# Patient Record
Sex: Female | Born: 1945 | Race: White | Hispanic: No | Smoking: Never smoker
Health system: Southern US, Community
[De-identification: ages and names within clinical notes are randomized; demographics above are authoritative.]

## PROBLEM LIST (undated history)

## (undated) DIAGNOSIS — R112 Nausea with vomiting, unspecified: Secondary | ICD-10-CM

## (undated) DIAGNOSIS — R7303 Prediabetes: Secondary | ICD-10-CM

## (undated) DIAGNOSIS — J45909 Unspecified asthma, uncomplicated: Secondary | ICD-10-CM

## (undated) DIAGNOSIS — S270XXA Traumatic pneumothorax, initial encounter: Secondary | ICD-10-CM

## (undated) DIAGNOSIS — I10 Essential (primary) hypertension: Secondary | ICD-10-CM

## (undated) DIAGNOSIS — I739 Peripheral vascular disease, unspecified: Secondary | ICD-10-CM

## (undated) DIAGNOSIS — M1990 Unspecified osteoarthritis, unspecified site: Secondary | ICD-10-CM

## (undated) DIAGNOSIS — T8859XA Other complications of anesthesia, initial encounter: Secondary | ICD-10-CM

## (undated) DIAGNOSIS — K219 Gastro-esophageal reflux disease without esophagitis: Secondary | ICD-10-CM

## (undated) DIAGNOSIS — E785 Hyperlipidemia, unspecified: Secondary | ICD-10-CM

## (undated) DIAGNOSIS — Z9889 Other specified postprocedural states: Secondary | ICD-10-CM

## (undated) DIAGNOSIS — C801 Malignant (primary) neoplasm, unspecified: Secondary | ICD-10-CM

## (undated) DIAGNOSIS — J189 Pneumonia, unspecified organism: Secondary | ICD-10-CM

## (undated) HISTORY — DX: Traumatic pneumothorax, initial encounter: S27.0XXA

## (undated) HISTORY — DX: Hyperlipidemia, unspecified: E78.5

## (undated) HISTORY — PX: REDUCTION MAMMAPLASTY: SUR839

## (undated) HISTORY — PX: BREAST SURGERY: SHX581

## (undated) HISTORY — PX: AUGMENTATION MAMMAPLASTY: SUR837

---

## 1980-10-15 HISTORY — PX: ABDOMINAL HYSTERECTOMY: SHX81

## 2001-08-11 ENCOUNTER — Encounter: Payer: Self-pay | Admitting: Family Medicine

## 2001-08-11 ENCOUNTER — Ambulatory Visit (HOSPITAL_COMMUNITY): Admission: RE | Admit: 2001-08-11 | Discharge: 2001-08-11 | Payer: Self-pay | Admitting: Family Medicine

## 2001-08-11 ENCOUNTER — Other Ambulatory Visit: Admission: RE | Admit: 2001-08-11 | Discharge: 2001-08-11 | Payer: Self-pay | Admitting: Family Medicine

## 2001-09-02 ENCOUNTER — Ambulatory Visit (HOSPITAL_COMMUNITY): Admission: RE | Admit: 2001-09-02 | Discharge: 2001-09-02 | Payer: Self-pay | Admitting: Family Medicine

## 2001-09-02 ENCOUNTER — Encounter: Payer: Self-pay | Admitting: Family Medicine

## 2002-05-05 ENCOUNTER — Encounter: Admission: RE | Admit: 2002-05-05 | Discharge: 2002-05-05 | Payer: Self-pay | Admitting: Neurosurgery

## 2002-05-05 ENCOUNTER — Encounter: Payer: Self-pay | Admitting: Neurosurgery

## 2002-05-19 ENCOUNTER — Encounter: Admission: RE | Admit: 2002-05-19 | Discharge: 2002-05-19 | Payer: Self-pay | Admitting: Neurosurgery

## 2002-05-19 ENCOUNTER — Encounter: Payer: Self-pay | Admitting: Neurosurgery

## 2002-06-01 ENCOUNTER — Encounter: Payer: Self-pay | Admitting: Neurosurgery

## 2002-06-01 ENCOUNTER — Encounter: Admission: RE | Admit: 2002-06-01 | Discharge: 2002-06-01 | Payer: Self-pay | Admitting: Neurosurgery

## 2002-10-11 ENCOUNTER — Emergency Department (HOSPITAL_COMMUNITY): Admission: EM | Admit: 2002-10-11 | Discharge: 2002-10-12 | Payer: Self-pay | Admitting: Emergency Medicine

## 2002-12-21 ENCOUNTER — Encounter: Admission: RE | Admit: 2002-12-21 | Discharge: 2002-12-21 | Payer: Self-pay | Admitting: Plastic Surgery

## 2002-12-21 ENCOUNTER — Encounter: Payer: Self-pay | Admitting: Plastic Surgery

## 2003-08-26 ENCOUNTER — Ambulatory Visit (HOSPITAL_COMMUNITY): Admission: RE | Admit: 2003-08-26 | Discharge: 2003-08-26 | Payer: Self-pay | Admitting: Pulmonary Disease

## 2003-12-23 ENCOUNTER — Ambulatory Visit (HOSPITAL_COMMUNITY): Admission: RE | Admit: 2003-12-23 | Discharge: 2003-12-23 | Payer: Self-pay | Admitting: Family Medicine

## 2005-01-11 ENCOUNTER — Ambulatory Visit (HOSPITAL_COMMUNITY): Admission: RE | Admit: 2005-01-11 | Discharge: 2005-01-11 | Payer: Self-pay | Admitting: Family Medicine

## 2005-02-05 ENCOUNTER — Ambulatory Visit (HOSPITAL_COMMUNITY): Admission: RE | Admit: 2005-02-05 | Discharge: 2005-02-05 | Payer: Self-pay | Admitting: Family Medicine

## 2005-10-15 HISTORY — PX: AXILLARY SURGERY: SHX892

## 2006-01-21 ENCOUNTER — Ambulatory Visit (HOSPITAL_COMMUNITY): Admission: RE | Admit: 2006-01-21 | Discharge: 2006-01-21 | Payer: Self-pay | Admitting: Family Medicine

## 2006-07-23 ENCOUNTER — Ambulatory Visit (HOSPITAL_COMMUNITY): Admission: RE | Admit: 2006-07-23 | Discharge: 2006-07-23 | Payer: Self-pay | Admitting: Orthopaedic Surgery

## 2006-07-30 ENCOUNTER — Ambulatory Visit (HOSPITAL_COMMUNITY): Admission: RE | Admit: 2006-07-30 | Discharge: 2006-07-30 | Payer: Self-pay | Admitting: Orthopaedic Surgery

## 2006-07-30 ENCOUNTER — Encounter (INDEPENDENT_AMBULATORY_CARE_PROVIDER_SITE_OTHER): Payer: Self-pay | Admitting: *Deleted

## 2006-08-01 ENCOUNTER — Inpatient Hospital Stay (HOSPITAL_COMMUNITY): Admission: AD | Admit: 2006-08-01 | Discharge: 2006-08-09 | Payer: Self-pay | Admitting: General Surgery

## 2006-08-02 ENCOUNTER — Encounter (INDEPENDENT_AMBULATORY_CARE_PROVIDER_SITE_OTHER): Payer: Self-pay | Admitting: Specialist

## 2006-08-10 ENCOUNTER — Emergency Department (HOSPITAL_COMMUNITY): Admission: EM | Admit: 2006-08-10 | Discharge: 2006-08-10 | Payer: Self-pay | Admitting: Emergency Medicine

## 2006-08-10 ENCOUNTER — Ambulatory Visit (HOSPITAL_COMMUNITY): Payer: Self-pay | Admitting: General Surgery

## 2006-08-10 ENCOUNTER — Encounter (HOSPITAL_COMMUNITY): Admission: RE | Admit: 2006-08-10 | Discharge: 2006-09-09 | Payer: Self-pay | Admitting: General Surgery

## 2006-08-11 ENCOUNTER — Emergency Department (HOSPITAL_COMMUNITY): Admission: EM | Admit: 2006-08-11 | Discharge: 2006-08-11 | Payer: Self-pay | Admitting: Emergency Medicine

## 2006-08-12 ENCOUNTER — Encounter (HOSPITAL_COMMUNITY): Admission: RE | Admit: 2006-08-12 | Discharge: 2006-08-14 | Payer: Self-pay | Admitting: General Surgery

## 2006-08-14 ENCOUNTER — Encounter (HOSPITAL_COMMUNITY): Admission: RE | Admit: 2006-08-14 | Discharge: 2006-09-13 | Payer: Self-pay | Admitting: Orthopaedic Surgery

## 2006-09-10 ENCOUNTER — Encounter (HOSPITAL_COMMUNITY): Admission: RE | Admit: 2006-09-10 | Discharge: 2006-10-10 | Payer: Self-pay | Admitting: General Surgery

## 2006-10-15 HISTORY — PX: MASTECTOMY: SHX3

## 2007-01-23 ENCOUNTER — Ambulatory Visit (HOSPITAL_COMMUNITY): Admission: RE | Admit: 2007-01-23 | Discharge: 2007-01-23 | Payer: Self-pay | Admitting: Family Medicine

## 2007-01-30 ENCOUNTER — Encounter (INDEPENDENT_AMBULATORY_CARE_PROVIDER_SITE_OTHER): Payer: Self-pay | Admitting: Diagnostic Radiology

## 2007-01-30 ENCOUNTER — Encounter (INDEPENDENT_AMBULATORY_CARE_PROVIDER_SITE_OTHER): Payer: Self-pay | Admitting: Specialist

## 2007-01-30 ENCOUNTER — Encounter: Admission: RE | Admit: 2007-01-30 | Discharge: 2007-01-30 | Payer: Self-pay | Admitting: Family Medicine

## 2007-02-11 ENCOUNTER — Encounter: Admission: RE | Admit: 2007-02-11 | Discharge: 2007-02-11 | Payer: Self-pay | Admitting: Family Medicine

## 2007-02-26 ENCOUNTER — Ambulatory Visit (HOSPITAL_COMMUNITY): Admission: RE | Admit: 2007-02-26 | Discharge: 2007-02-27 | Payer: Self-pay | Admitting: Surgery

## 2007-02-26 ENCOUNTER — Encounter (INDEPENDENT_AMBULATORY_CARE_PROVIDER_SITE_OTHER): Payer: Self-pay | Admitting: *Deleted

## 2007-03-06 ENCOUNTER — Ambulatory Visit: Payer: Self-pay | Admitting: Oncology

## 2007-04-03 LAB — COMPREHENSIVE METABOLIC PANEL
ALT: 38 U/L — ABNORMAL HIGH (ref 0–35)
AST: 29 U/L (ref 0–37)
Albumin: 4.3 g/dL (ref 3.5–5.2)
Alkaline Phosphatase: 88 U/L (ref 39–117)
BUN: 18 mg/dL (ref 6–23)
CO2: 26 mEq/L (ref 19–32)
Calcium: 9.4 mg/dL (ref 8.4–10.5)
Chloride: 103 mEq/L (ref 96–112)
Creatinine, Ser: 0.81 mg/dL (ref 0.40–1.20)
Glucose, Bld: 115 mg/dL — ABNORMAL HIGH (ref 70–99)
Potassium: 3.9 mEq/L (ref 3.5–5.3)
Sodium: 140 mEq/L (ref 135–145)
Total Bilirubin: 0.3 mg/dL (ref 0.3–1.2)
Total Protein: 7 g/dL (ref 6.0–8.3)

## 2007-04-03 LAB — CBC WITH DIFFERENTIAL/PLATELET
BASO%: 0.5 % (ref 0.0–2.0)
Basophils Absolute: 0 10*3/uL (ref 0.0–0.1)
EOS%: 3.7 % (ref 0.0–7.0)
Eosinophils Absolute: 0.3 10*3/uL (ref 0.0–0.5)
HCT: 31.9 % — ABNORMAL LOW (ref 34.8–46.6)
HGB: 11.3 g/dL — ABNORMAL LOW (ref 11.6–15.9)
LYMPH%: 37.4 % (ref 14.0–48.0)
MCH: 31.5 pg (ref 26.0–34.0)
MCHC: 35.4 g/dL (ref 32.0–36.0)
MCV: 88.8 fL (ref 81.0–101.0)
MONO#: 0.5 10*3/uL (ref 0.1–0.9)
MONO%: 7.4 % (ref 0.0–13.0)
NEUT#: 3.7 10*3/uL (ref 1.5–6.5)
NEUT%: 51 % (ref 39.6–76.8)
Platelets: 374 10*3/uL (ref 145–400)
RBC: 3.59 10*6/uL — ABNORMAL LOW (ref 3.70–5.32)
RDW: 11.7 % (ref 11.3–14.5)
WBC: 7.2 10*3/uL (ref 3.9–10.0)
lymph#: 2.7 10*3/uL (ref 0.9–3.3)

## 2007-04-24 ENCOUNTER — Encounter: Admission: RE | Admit: 2007-04-24 | Discharge: 2007-04-24 | Payer: Self-pay | Admitting: Oncology

## 2007-04-28 ENCOUNTER — Ambulatory Visit: Payer: Self-pay | Admitting: Oncology

## 2007-06-30 ENCOUNTER — Ambulatory Visit: Payer: Self-pay | Admitting: Oncology

## 2007-09-17 ENCOUNTER — Ambulatory Visit: Payer: Self-pay | Admitting: Oncology

## 2007-09-19 LAB — COMPREHENSIVE METABOLIC PANEL
ALT: 13 U/L (ref 0–35)
AST: 12 U/L (ref 0–37)
Albumin: 4 g/dL (ref 3.5–5.2)
Alkaline Phosphatase: 71 U/L (ref 39–117)
BUN: 17 mg/dL (ref 6–23)
CO2: 26 mEq/L (ref 19–32)
Calcium: 9.3 mg/dL (ref 8.4–10.5)
Chloride: 103 mEq/L (ref 96–112)
Creatinine, Ser: 0.79 mg/dL (ref 0.40–1.20)
Glucose, Bld: 118 mg/dL — ABNORMAL HIGH (ref 70–99)
Potassium: 3.6 mEq/L (ref 3.5–5.3)
Sodium: 140 mEq/L (ref 135–145)
Total Bilirubin: 0.3 mg/dL (ref 0.3–1.2)
Total Protein: 6.8 g/dL (ref 6.0–8.3)

## 2007-09-19 LAB — CBC WITH DIFFERENTIAL/PLATELET
BASO%: 0.4 % (ref 0.0–2.0)
Basophils Absolute: 0 10*3/uL (ref 0.0–0.1)
EOS%: 0.6 % (ref 0.0–7.0)
Eosinophils Absolute: 0.1 10*3/uL (ref 0.0–0.5)
HCT: 32.7 % — ABNORMAL LOW (ref 34.8–46.6)
HGB: 11.2 g/dL — ABNORMAL LOW (ref 11.6–15.9)
LYMPH%: 31.5 % (ref 14.0–48.0)
MCH: 30.3 pg (ref 26.0–34.0)
MCHC: 34.2 g/dL (ref 32.0–36.0)
MCV: 88.5 fL (ref 81.0–101.0)
MONO#: 0.9 10*3/uL (ref 0.1–0.9)
MONO%: 8 % (ref 0.0–13.0)
NEUT#: 6.6 10*3/uL — ABNORMAL HIGH (ref 1.5–6.5)
NEUT%: 59.5 % (ref 39.6–76.8)
Platelets: 434 10*3/uL — ABNORMAL HIGH (ref 145–400)
RBC: 3.7 10*6/uL (ref 3.70–5.32)
RDW: 14.3 % (ref 11.3–14.5)
WBC: 11.1 10*3/uL — ABNORMAL HIGH (ref 3.9–10.0)
lymph#: 3.5 10*3/uL — ABNORMAL HIGH (ref 0.9–3.3)

## 2007-09-19 LAB — CANCER ANTIGEN 27.29: CA 27.29: 20 U/mL (ref 0–39)

## 2007-09-19 LAB — LACTATE DEHYDROGENASE: LDH: 172 U/L (ref 94–250)

## 2007-10-16 HISTORY — PX: JOINT REPLACEMENT: SHX530

## 2008-01-05 ENCOUNTER — Ambulatory Visit (HOSPITAL_COMMUNITY): Admission: RE | Admit: 2008-01-05 | Discharge: 2008-01-05 | Payer: Self-pay | Admitting: Family Medicine

## 2008-01-26 ENCOUNTER — Encounter: Admission: RE | Admit: 2008-01-26 | Discharge: 2008-01-26 | Payer: Self-pay | Admitting: Oncology

## 2008-03-10 ENCOUNTER — Ambulatory Visit: Payer: Self-pay | Admitting: Oncology

## 2008-03-12 LAB — CBC WITH DIFFERENTIAL/PLATELET
BASO%: 0.2 % (ref 0.0–2.0)
Basophils Absolute: 0 10*3/uL (ref 0.0–0.1)
EOS%: 0.6 % (ref 0.0–7.0)
Eosinophils Absolute: 0 10*3/uL (ref 0.0–0.5)
HCT: 36.8 % (ref 34.8–46.6)
HGB: 12.9 g/dL (ref 11.6–15.9)
LYMPH%: 36.8 % (ref 14.0–48.0)
MCH: 32.5 pg (ref 26.0–34.0)
MCHC: 35 g/dL (ref 32.0–36.0)
MCV: 92.8 fL (ref 81.0–101.0)
MONO#: 0.7 10*3/uL (ref 0.1–0.9)
MONO%: 8.1 % (ref 0.0–13.0)
NEUT#: 4.6 10*3/uL (ref 1.5–6.5)
NEUT%: 54.3 % (ref 39.6–76.8)
Platelets: 310 10*3/uL (ref 145–400)
RBC: 3.96 10*6/uL (ref 3.70–5.32)
RDW: 12.5 % (ref 11.3–14.5)
WBC: 8.5 10*3/uL (ref 3.9–10.0)
lymph#: 3.1 10*3/uL (ref 0.9–3.3)

## 2008-03-12 LAB — COMPREHENSIVE METABOLIC PANEL
ALT: 17 U/L (ref 0–35)
AST: 14 U/L (ref 0–37)
Albumin: 4.1 g/dL (ref 3.5–5.2)
Alkaline Phosphatase: 58 U/L (ref 39–117)
BUN: 21 mg/dL (ref 6–23)
CO2: 28 mEq/L (ref 19–32)
Calcium: 9.1 mg/dL (ref 8.4–10.5)
Chloride: 102 mEq/L (ref 96–112)
Creatinine, Ser: 0.77 mg/dL (ref 0.40–1.20)
Glucose, Bld: 105 mg/dL — ABNORMAL HIGH (ref 70–99)
Potassium: 3.8 mEq/L (ref 3.5–5.3)
Sodium: 141 mEq/L (ref 135–145)
Total Bilirubin: 0.5 mg/dL (ref 0.3–1.2)
Total Protein: 6.6 g/dL (ref 6.0–8.3)

## 2008-08-04 ENCOUNTER — Inpatient Hospital Stay (HOSPITAL_COMMUNITY): Admission: RE | Admit: 2008-08-04 | Discharge: 2008-08-08 | Payer: Self-pay | Admitting: Orthopedic Surgery

## 2008-10-02 ENCOUNTER — Inpatient Hospital Stay (HOSPITAL_COMMUNITY): Admission: EM | Admit: 2008-10-02 | Discharge: 2008-10-04 | Payer: Self-pay | Admitting: Family Medicine

## 2008-10-02 ENCOUNTER — Ambulatory Visit: Payer: Self-pay | Admitting: Family Medicine

## 2008-10-04 ENCOUNTER — Ambulatory Visit: Payer: Self-pay | Admitting: Oncology

## 2008-10-06 LAB — CBC WITH DIFFERENTIAL/PLATELET
BASO%: 0.3 % (ref 0.0–2.0)
Basophils Absolute: 0 10*3/uL (ref 0.0–0.1)
EOS%: 0.9 % (ref 0.0–7.0)
Eosinophils Absolute: 0.1 10*3/uL (ref 0.0–0.5)
HCT: 28.5 % — ABNORMAL LOW (ref 34.8–46.6)
HGB: 9.6 g/dL — ABNORMAL LOW (ref 11.6–15.9)
LYMPH%: 17.8 % (ref 14.0–48.0)
MCH: 29.7 pg (ref 26.0–34.0)
MCHC: 33.5 g/dL (ref 32.0–36.0)
MCV: 88.7 fL (ref 81.0–101.0)
MONO#: 1 10*3/uL — ABNORMAL HIGH (ref 0.1–0.9)
MONO%: 7.6 % (ref 0.0–13.0)
NEUT#: 9.6 10*3/uL — ABNORMAL HIGH (ref 1.5–6.5)
NEUT%: 73.4 % (ref 39.6–76.8)
Platelets: 300 10*3/uL (ref 145–400)
RBC: 3.22 10*6/uL — ABNORMAL LOW (ref 3.70–5.32)
RDW: 13.7 % (ref 11.3–14.5)
WBC: 13 10*3/uL — ABNORMAL HIGH (ref 3.9–10.0)
lymph#: 2.3 10*3/uL (ref 0.9–3.3)

## 2008-10-07 LAB — COMPREHENSIVE METABOLIC PANEL
ALT: 16 U/L (ref 0–35)
AST: 22 U/L (ref 0–37)
Albumin: 3.5 g/dL (ref 3.5–5.2)
Alkaline Phosphatase: 60 U/L (ref 39–117)
BUN: 9 mg/dL (ref 6–23)
CO2: 23 mEq/L (ref 19–32)
Calcium: 8.4 mg/dL (ref 8.4–10.5)
Chloride: 102 mEq/L (ref 96–112)
Creatinine, Ser: 0.59 mg/dL (ref 0.40–1.20)
Glucose, Bld: 106 mg/dL — ABNORMAL HIGH (ref 70–99)
Potassium: 3.3 mEq/L — ABNORMAL LOW (ref 3.5–5.3)
Sodium: 136 mEq/L (ref 135–145)
Total Bilirubin: 0.4 mg/dL (ref 0.3–1.2)
Total Protein: 6.4 g/dL (ref 6.0–8.3)

## 2008-10-07 LAB — LACTATE DEHYDROGENASE: LDH: 179 U/L (ref 94–250)

## 2008-10-07 LAB — VITAMIN D 25 HYDROXY (VIT D DEFICIENCY, FRACTURES): Vit D, 25-Hydroxy: 37 ng/mL (ref 30–89)

## 2008-10-12 ENCOUNTER — Ambulatory Visit (HOSPITAL_COMMUNITY): Admission: RE | Admit: 2008-10-12 | Discharge: 2008-10-12 | Payer: Self-pay | Admitting: Family Medicine

## 2008-11-03 ENCOUNTER — Ambulatory Visit (HOSPITAL_COMMUNITY): Admission: RE | Admit: 2008-11-03 | Discharge: 2008-11-03 | Payer: Self-pay | Admitting: Family Medicine

## 2008-12-03 ENCOUNTER — Inpatient Hospital Stay (HOSPITAL_COMMUNITY): Admission: RE | Admit: 2008-12-03 | Discharge: 2008-12-07 | Payer: Self-pay | Admitting: Orthopedic Surgery

## 2009-03-09 ENCOUNTER — Encounter: Admission: RE | Admit: 2009-03-09 | Discharge: 2009-03-09 | Payer: Self-pay | Admitting: Oncology

## 2009-03-21 ENCOUNTER — Ambulatory Visit: Payer: Self-pay | Admitting: Oncology

## 2009-05-31 ENCOUNTER — Ambulatory Visit: Payer: Self-pay | Admitting: Oncology

## 2009-06-28 LAB — COMPREHENSIVE METABOLIC PANEL
ALT: 20 U/L (ref 0–35)
AST: 25 U/L (ref 0–37)
Albumin: 4.1 g/dL (ref 3.5–5.2)
Alkaline Phosphatase: 68 U/L (ref 39–117)
BUN: 17 mg/dL (ref 6–23)
CO2: 27 mEq/L (ref 19–32)
Calcium: 9.1 mg/dL (ref 8.4–10.5)
Chloride: 104 mEq/L (ref 96–112)
Creatinine, Ser: 0.74 mg/dL (ref 0.40–1.20)
Glucose, Bld: 107 mg/dL — ABNORMAL HIGH (ref 70–99)
Potassium: 3.1 mEq/L — ABNORMAL LOW (ref 3.5–5.3)
Sodium: 139 mEq/L (ref 135–145)
Total Bilirubin: 0.4 mg/dL (ref 0.3–1.2)
Total Protein: 6.7 g/dL (ref 6.0–8.3)

## 2009-06-28 LAB — CBC WITH DIFFERENTIAL/PLATELET
BASO%: 0.4 % (ref 0.0–2.0)
Basophils Absolute: 0 10*3/uL (ref 0.0–0.1)
EOS%: 1.9 % (ref 0.0–7.0)
Eosinophils Absolute: 0.1 10*3/uL (ref 0.0–0.5)
HCT: 36.9 % (ref 34.8–46.6)
HGB: 12.8 g/dL (ref 11.6–15.9)
LYMPH%: 54.2 % — ABNORMAL HIGH (ref 14.0–49.7)
MCH: 31.9 pg (ref 25.1–34.0)
MCHC: 34.7 g/dL (ref 31.5–36.0)
MCV: 92 fL (ref 79.5–101.0)
MONO#: 0.5 10*3/uL (ref 0.1–0.9)
MONO%: 7 % (ref 0.0–14.0)
NEUT#: 2.7 10*3/uL (ref 1.5–6.5)
NEUT%: 36.5 % — ABNORMAL LOW (ref 38.4–76.8)
Platelets: 268 10*3/uL (ref 145–400)
RBC: 4.01 10*6/uL (ref 3.70–5.45)
RDW: 11.8 % (ref 11.2–14.5)
WBC: 7.3 10*3/uL (ref 3.9–10.3)
lymph#: 4 10*3/uL — ABNORMAL HIGH (ref 0.9–3.3)

## 2009-06-28 LAB — RESEARCH LABS

## 2009-10-15 HISTORY — PX: JOINT REPLACEMENT: SHX530

## 2009-10-15 HISTORY — PX: SHOULDER ARTHROSCOPY DISTAL CLAVICLE EXCISION AND OPEN ROTATOR CUFF REPAIR: SHX2396

## 2009-10-21 ENCOUNTER — Ambulatory Visit (HOSPITAL_BASED_OUTPATIENT_CLINIC_OR_DEPARTMENT_OTHER): Admission: RE | Admit: 2009-10-21 | Discharge: 2009-10-21 | Payer: Self-pay | Admitting: Orthopedic Surgery

## 2009-10-25 ENCOUNTER — Ambulatory Visit: Payer: Self-pay | Admitting: Oncology

## 2009-10-28 LAB — CBC WITH DIFFERENTIAL/PLATELET
BASO%: 0.4 % (ref 0.0–2.0)
Basophils Absolute: 0 10*3/uL (ref 0.0–0.1)
EOS%: 5.5 % (ref 0.0–7.0)
Eosinophils Absolute: 0.5 10*3/uL (ref 0.0–0.5)
HCT: 35.6 % (ref 34.8–46.6)
HGB: 11.9 g/dL (ref 11.6–15.9)
LYMPH%: 38.1 % (ref 14.0–49.7)
MCH: 31.9 pg (ref 25.1–34.0)
MCHC: 33.4 g/dL (ref 31.5–36.0)
MCV: 95.4 fL (ref 79.5–101.0)
MONO#: 0.5 10*3/uL (ref 0.1–0.9)
MONO%: 6 % (ref 0.0–14.0)
NEUT#: 4.2 10*3/uL (ref 1.5–6.5)
NEUT%: 50 % (ref 38.4–76.8)
Platelets: 319 10*3/uL (ref 145–400)
RBC: 3.73 10*6/uL (ref 3.70–5.45)
RDW: 11.6 % (ref 11.2–14.5)
WBC: 8.3 10*3/uL (ref 3.9–10.3)
lymph#: 3.2 10*3/uL (ref 0.9–3.3)

## 2009-10-28 LAB — COMPREHENSIVE METABOLIC PANEL
ALT: 13 U/L (ref 0–35)
AST: 14 U/L (ref 0–37)
Albumin: 4 g/dL (ref 3.5–5.2)
Alkaline Phosphatase: 70 U/L (ref 39–117)
BUN: 18 mg/dL (ref 6–23)
CO2: 19 mEq/L (ref 19–32)
Calcium: 8.9 mg/dL (ref 8.4–10.5)
Chloride: 104 mEq/L (ref 96–112)
Creatinine, Ser: 0.75 mg/dL (ref 0.40–1.20)
Glucose, Bld: 147 mg/dL — ABNORMAL HIGH (ref 70–99)
Potassium: 3.8 mEq/L (ref 3.5–5.3)
Sodium: 139 mEq/L (ref 135–145)
Total Bilirubin: 0.3 mg/dL (ref 0.3–1.2)
Total Protein: 6.6 g/dL (ref 6.0–8.3)

## 2009-10-28 LAB — CANCER ANTIGEN 27.29: CA 27.29: 17 U/mL (ref 0–39)

## 2009-10-28 LAB — VITAMIN D 25 HYDROXY (VIT D DEFICIENCY, FRACTURES): Vit D, 25-Hydroxy: 34 ng/mL (ref 30–89)

## 2009-10-28 LAB — RESEARCH LABS

## 2009-10-28 LAB — LACTATE DEHYDROGENASE: LDH: 153 U/L (ref 94–250)

## 2010-03-10 ENCOUNTER — Encounter: Admission: RE | Admit: 2010-03-10 | Discharge: 2010-03-10 | Payer: Self-pay | Admitting: Oncology

## 2010-03-29 ENCOUNTER — Ambulatory Visit (HOSPITAL_COMMUNITY): Admission: RE | Admit: 2010-03-29 | Discharge: 2010-03-29 | Payer: Self-pay | Admitting: Family Medicine

## 2010-04-14 ENCOUNTER — Ambulatory Visit: Payer: Self-pay | Admitting: Oncology

## 2010-04-19 LAB — COMPREHENSIVE METABOLIC PANEL
ALT: 20 U/L (ref 0–35)
AST: 16 U/L (ref 0–37)
Albumin: 4.2 g/dL (ref 3.5–5.2)
Alkaline Phosphatase: 58 U/L (ref 39–117)
BUN: 24 mg/dL — ABNORMAL HIGH (ref 6–23)
CO2: 23 mEq/L (ref 19–32)
Calcium: 9.2 mg/dL (ref 8.4–10.5)
Chloride: 103 mEq/L (ref 96–112)
Creatinine, Ser: 0.72 mg/dL (ref 0.40–1.20)
Glucose, Bld: 126 mg/dL — ABNORMAL HIGH (ref 70–99)
Potassium: 3.4 mEq/L — ABNORMAL LOW (ref 3.5–5.3)
Sodium: 139 mEq/L (ref 135–145)
Total Bilirubin: 0.3 mg/dL (ref 0.3–1.2)
Total Protein: 6.4 g/dL (ref 6.0–8.3)

## 2010-04-19 LAB — CBC WITH DIFFERENTIAL/PLATELET
BASO%: 0.7 % (ref 0.0–2.0)
Basophils Absolute: 0.1 10*3/uL (ref 0.0–0.1)
EOS%: 3 % (ref 0.0–7.0)
Eosinophils Absolute: 0.2 10*3/uL (ref 0.0–0.5)
HCT: 35.8 % (ref 34.8–46.6)
HGB: 12.6 g/dL (ref 11.6–15.9)
LYMPH%: 43.5 % (ref 14.0–49.7)
MCH: 33.9 pg (ref 25.1–34.0)
MCHC: 35.2 g/dL (ref 31.5–36.0)
MCV: 96.3 fL (ref 79.5–101.0)
MONO#: 0.6 10*3/uL (ref 0.1–0.9)
MONO%: 8 % (ref 0.0–14.0)
NEUT#: 3.5 10*3/uL (ref 1.5–6.5)
NEUT%: 44.8 % (ref 38.4–76.8)
Platelets: 263 10*3/uL (ref 145–400)
RBC: 3.72 10*6/uL (ref 3.70–5.45)
RDW: 12.1 % (ref 11.2–14.5)
WBC: 7.8 10*3/uL (ref 3.9–10.3)
lymph#: 3.4 10*3/uL — ABNORMAL HIGH (ref 0.9–3.3)

## 2010-04-19 LAB — VITAMIN D 25 HYDROXY (VIT D DEFICIENCY, FRACTURES): Vit D, 25-Hydroxy: 43 ng/mL (ref 30–89)

## 2010-11-04 ENCOUNTER — Other Ambulatory Visit: Payer: Self-pay | Admitting: Oncology

## 2010-11-04 DIAGNOSIS — Z0000 Encounter for general adult medical examination without abnormal findings: Secondary | ICD-10-CM

## 2010-11-30 ENCOUNTER — Other Ambulatory Visit: Payer: Self-pay | Admitting: Internal Medicine

## 2010-12-04 ENCOUNTER — Other Ambulatory Visit: Payer: Self-pay | Admitting: Internal Medicine

## 2010-12-07 ENCOUNTER — Other Ambulatory Visit (HOSPITAL_COMMUNITY): Payer: Self-pay | Admitting: Family Medicine

## 2010-12-07 ENCOUNTER — Encounter (HOSPITAL_COMMUNITY): Payer: Self-pay

## 2010-12-07 ENCOUNTER — Ambulatory Visit (HOSPITAL_COMMUNITY)
Admission: RE | Admit: 2010-12-07 | Discharge: 2010-12-07 | Disposition: A | Discharge status: Home / Self Care | Payer: Medicare Other | Source: Ambulatory Visit | Attending: Family Medicine | Admitting: Family Medicine

## 2010-12-07 DIAGNOSIS — R05 Cough: Secondary | ICD-10-CM | POA: Insufficient documentation

## 2010-12-07 DIAGNOSIS — IMO0001 Reserved for inherently not codable concepts without codable children: Secondary | ICD-10-CM

## 2010-12-07 DIAGNOSIS — R0602 Shortness of breath: Secondary | ICD-10-CM | POA: Insufficient documentation

## 2010-12-07 DIAGNOSIS — R059 Cough, unspecified: Secondary | ICD-10-CM | POA: Insufficient documentation

## 2010-12-07 DIAGNOSIS — M4120 Other idiopathic scoliosis, site unspecified: Secondary | ICD-10-CM | POA: Insufficient documentation

## 2010-12-07 DIAGNOSIS — R509 Fever, unspecified: Secondary | ICD-10-CM

## 2010-12-07 DIAGNOSIS — R0989 Other specified symptoms and signs involving the circulatory and respiratory systems: Secondary | ICD-10-CM | POA: Insufficient documentation

## 2010-12-07 HISTORY — DX: Essential (primary) hypertension: I10

## 2010-12-19 ENCOUNTER — Emergency Department (HOSPITAL_BASED_OUTPATIENT_CLINIC_OR_DEPARTMENT_OTHER)
Admission: EM | Admit: 2010-12-19 | Discharge: 2010-12-20 | Disposition: A | Discharge status: Home / Self Care | Payer: Medicare Other | Attending: Emergency Medicine | Admitting: Emergency Medicine

## 2010-12-19 DIAGNOSIS — K5289 Other specified noninfective gastroenteritis and colitis: Secondary | ICD-10-CM | POA: Insufficient documentation

## 2010-12-19 DIAGNOSIS — K219 Gastro-esophageal reflux disease without esophagitis: Secondary | ICD-10-CM | POA: Insufficient documentation

## 2010-12-19 DIAGNOSIS — Z79899 Other long term (current) drug therapy: Secondary | ICD-10-CM | POA: Insufficient documentation

## 2010-12-19 DIAGNOSIS — R197 Diarrhea, unspecified: Secondary | ICD-10-CM | POA: Insufficient documentation

## 2010-12-19 DIAGNOSIS — I10 Essential (primary) hypertension: Secondary | ICD-10-CM | POA: Insufficient documentation

## 2010-12-19 DIAGNOSIS — G8929 Other chronic pain: Secondary | ICD-10-CM | POA: Insufficient documentation

## 2010-12-19 DIAGNOSIS — R112 Nausea with vomiting, unspecified: Secondary | ICD-10-CM | POA: Insufficient documentation

## 2010-12-19 DIAGNOSIS — E860 Dehydration: Secondary | ICD-10-CM | POA: Insufficient documentation

## 2010-12-19 DIAGNOSIS — E785 Hyperlipidemia, unspecified: Secondary | ICD-10-CM | POA: Insufficient documentation

## 2010-12-31 LAB — BASIC METABOLIC PANEL
BUN: 15 mg/dL (ref 6–23)
CO2: 27 mEq/L (ref 19–32)
Calcium: 9.4 mg/dL (ref 8.4–10.5)
Chloride: 102 mEq/L (ref 96–112)
Creatinine, Ser: 0.77 mg/dL (ref 0.4–1.2)
GFR calc Af Amer: >60 mL/min (ref >60)
GFR calc non Af Amer: >60 mL/min (ref >60)
Glucose, Bld: 149 mg/dL — ABNORMAL HIGH (ref 70–99)
Potassium: 4.1 mEq/L (ref 3.5–5.1)
Sodium: 140 mEq/L (ref 135–145)

## 2010-12-31 LAB — POCT HEMOGLOBIN-HEMACUE: Hemoglobin: 12.8 g/dL (ref 12.0–15.0)

## 2011-01-30 LAB — DIFFERENTIAL
Basophils Absolute: 0 10*3/uL (ref 0.0–0.1)
Basophils Relative: 0 % (ref 0–1)
Eosinophils Absolute: 0.2 10*3/uL (ref 0.0–0.7)
Eosinophils Relative: 2 % (ref 0–5)
Lymphocytes Relative: 32 % (ref 12–46)
Lymphs Abs: 3 10*3/uL (ref 0.7–4.0)
Monocytes Absolute: 0.6 10*3/uL (ref 0.1–1.0)
Monocytes Relative: 6 % (ref 3–12)
Neutro Abs: 5.6 10*3/uL (ref 1.7–7.7)
Neutrophils Relative %: 60 % (ref 43–77)

## 2011-01-30 LAB — CBC
HCT: 35.8 % — ABNORMAL LOW (ref 36.0–46.0)
HCT: 21.6 % — ABNORMAL LOW (ref 36.0–46.0)
HCT: 25.4 % — ABNORMAL LOW (ref 36.0–46.0)
HCT: 27 % — ABNORMAL LOW (ref 36.0–46.0)
HCT: 29.7 % — ABNORMAL LOW (ref 36.0–46.0)
Hemoglobin: 12.2 g/dL (ref 12.0–15.0)
Hemoglobin: 10.3 g/dL — ABNORMAL LOW (ref 12.0–15.0)
Hemoglobin: 7.5 g/dL — CL (ref 12.0–15.0)
Hemoglobin: 8.8 g/dL — ABNORMAL LOW (ref 12.0–15.0)
Hemoglobin: 9.3 g/dL — ABNORMAL LOW (ref 12.0–15.0)
MCHC: 34 g/dL (ref 30.0–36.0)
MCHC: 34.4 g/dL (ref 30.0–36.0)
MCHC: 34.4 g/dL (ref 30.0–36.0)
MCHC: 34.6 g/dL (ref 30.0–36.0)
MCHC: 34.7 g/dL (ref 30.0–36.0)
MCV: 87.2 fL (ref 78.0–100.0)
MCV: 86.2 fL (ref 78.0–100.0)
MCV: 87.5 fL (ref 78.0–100.0)
MCV: 87.6 fL (ref 78.0–100.0)
MCV: 89.1 fL (ref 78.0–100.0)
Platelets: 346 10*3/uL (ref 150–400)
Platelets: 202 10*3/uL (ref 150–400)
Platelets: 220 10*3/uL (ref 150–400)
Platelets: 220 10*3/uL (ref 150–400)
Platelets: 258 10*3/uL (ref 150–400)
RBC: 4.1 MIL/uL (ref 3.87–5.11)
RBC: 2.51 MIL/uL — ABNORMAL LOW (ref 3.87–5.11)
RBC: 2.9 MIL/uL — ABNORMAL LOW (ref 3.87–5.11)
RBC: 3.08 MIL/uL — ABNORMAL LOW (ref 3.87–5.11)
RBC: 3.33 MIL/uL — ABNORMAL LOW (ref 3.87–5.11)
RDW: 15.2 % (ref 11.5–15.5)
RDW: 14.6 % (ref 11.5–15.5)
RDW: 15.3 % (ref 11.5–15.5)
RDW: 15.4 % (ref 11.5–15.5)
RDW: 15.5 % (ref 11.5–15.5)
WBC: 9.4 10*3/uL (ref 4.0–10.5)
WBC: 10.6 10*3/uL — ABNORMAL HIGH (ref 4.0–10.5)
WBC: 8.1 10*3/uL (ref 4.0–10.5)
WBC: 8.5 10*3/uL (ref 4.0–10.5)
WBC: 9.9 10*3/uL (ref 4.0–10.5)

## 2011-01-30 LAB — URINE MICROSCOPIC-ADD ON

## 2011-01-30 LAB — PROTIME-INR
INR: 0.9 (ref 0.00–1.49)
INR: 1.1 (ref 0.00–1.49)
INR: 1.4 (ref 0.00–1.49)
INR: 2.1 — ABNORMAL HIGH (ref 0.00–1.49)
INR: 2.3 — ABNORMAL HIGH (ref 0.00–1.49)
Prothrombin Time: 12.4 seconds (ref 11.6–15.2)
Prothrombin Time: 14.6 seconds (ref 11.6–15.2)
Prothrombin Time: 18 seconds — ABNORMAL HIGH (ref 11.6–15.2)
Prothrombin Time: 25.3 seconds — ABNORMAL HIGH (ref 11.6–15.2)
Prothrombin Time: 26.4 seconds — ABNORMAL HIGH (ref 11.6–15.2)

## 2011-01-30 LAB — COMPREHENSIVE METABOLIC PANEL
ALT: 23 U/L (ref 0–35)
AST: 25 U/L (ref 0–37)
Albumin: 3.8 g/dL (ref 3.5–5.2)
Alkaline Phosphatase: 77 U/L (ref 39–117)
BUN: 11 mg/dL (ref 6–23)
CO2: 30 mEq/L (ref 19–32)
Calcium: 9.3 mg/dL (ref 8.4–10.5)
Chloride: 101 mEq/L (ref 96–112)
Creatinine, Ser: 0.74 mg/dL (ref 0.4–1.2)
GFR calc Af Amer: >60 mL/min (ref >60)
GFR calc non Af Amer: >60 mL/min (ref >60)
Glucose, Bld: 122 mg/dL — ABNORMAL HIGH (ref 70–99)
Potassium: 3.8 mEq/L (ref 3.5–5.1)
Sodium: 141 mEq/L (ref 135–145)
Total Bilirubin: 0.4 mg/dL (ref 0.3–1.2)
Total Protein: 6.7 g/dL (ref 6.0–8.3)

## 2011-01-30 LAB — URINALYSIS, ROUTINE W REFLEX MICROSCOPIC
Bilirubin Urine: NEGATIVE
Glucose, UA: NEGATIVE mg/dL
Hgb urine dipstick: NEGATIVE
Ketones, ur: NEGATIVE mg/dL
Nitrite: NEGATIVE
Protein, ur: NEGATIVE mg/dL
Specific Gravity, Urine: 1.017 (ref 1.005–1.030)
Urobilinogen, UA: 0.2 mg/dL (ref 0.0–1.0)
pH: 5.5 (ref 5.0–8.0)

## 2011-01-30 LAB — BASIC METABOLIC PANEL
BUN: 7 mg/dL (ref 6–23)
CO2: 28 mEq/L (ref 19–32)
Calcium: 8.2 mg/dL — ABNORMAL LOW (ref 8.4–10.5)
Chloride: 100 mEq/L (ref 96–112)
Creatinine, Ser: 0.66 mg/dL (ref 0.4–1.2)
GFR calc Af Amer: >60 mL/min (ref >60)
GFR calc non Af Amer: >60 mL/min (ref >60)
Glucose, Bld: 150 mg/dL — ABNORMAL HIGH (ref 70–99)
Potassium: 3.5 mEq/L (ref 3.5–5.1)
Sodium: 134 mEq/L — ABNORMAL LOW (ref 135–145)

## 2011-01-30 LAB — TYPE AND SCREEN
ABO/RH(D): O POS
Antibody Screen: NEGATIVE

## 2011-01-30 LAB — APTT: aPTT: 25 seconds (ref 24–37)

## 2011-01-30 LAB — PREPARE RBC (CROSSMATCH)

## 2011-02-27 NOTE — Discharge Summary (Signed)
NAMEDORETTA, REMMERT               ACCOUNT NO.:  [id number]   MEDICAL RECORD NO.:  [id number]          PATIENT TYPE:  OIB   LOCATION:  5711                         FACILITY:  [facility]   PHYSICIAN:  Thomas A. Cornett, M.D.DATE OF BIRTH:  January 27, 1946   DATE OF ADMISSION:  02/26/2007  DATE OF DISCHARGE:  02/27/2007                               DISCHARGE SUMMARY   ADMITTING DIAGNOSIS:  Right breast DCIS.   DISCHARGE DIAGNOSIS:  Right breast DCIS.   PROCEDURES PERFORMED:  Right mastectomy.   HISTORY OF PRESENT ILLNESS:  Patient is a 65 year old female with DCIS.  She had a right mastectomy for DCIS on Feb 26, 2007.  There were no  complications.   POSTOP COURSE:  Her postop course was unremarkable.  She was discharged  home on postoperative day 1 in improved condition.  JP instructions were  given.  Dressings were to be removed the day after discharge.  Patient  may shower TOMARROW  Medicine, take Vicodin as needed for pain 1-2 q.4  p.r.n.   CONDITION AT DISCHARGE:  Improved.      Thomas A. Cornett, M.D.  Electronically Signed     TAC/MEDQ  D:  02/27/2007  T:  02/27/2007  Job:  [id number]

## 2011-02-27 NOTE — H&P (Signed)
[name], [name]   MEDICAL RECORD NO.:  [id number]          PATIENT TYPE:  INP   LOCATION:  4715                         FACILITY:  [facility]   PHYSICIAN:  Adrienne Ramp, MD        DATE OF BIRTH:  1946/10/13   DATE OF ADMISSION:  10/02/2008  DATE OF DISCHARGE:                              HISTORY & PHYSICAL   PRIMARY CARE PHYSICIAN:  Adrienne G. Renard Matter, MD, in [place], [place]  [place].   CHIEF COMPLAINT:  Influenza-like illness with dehydration.   HISTORY OF PRESENT ILLNESS:  [name] is a 65 year old, with past  medical history of hypertension, breast cancer, status post mastectomy,  and hyperlipidemia that was directly admitted from [facility].  She was in her usual state of health until this morning at 8:30 a.m.  during which time, she began to have fevers and chills.  About 2 hours  later, she began having vomiting and diarrhea.  Since that time, she has  had decreased p.o. intake.  [name] describes her diarrhea 65 as watery  with no blood and her vomiting as nonbloody, nonbilious.  She also  complains of a cough that she describes as nagging times several weeks,  that is productive of thick mucus, that sometimes is green and chokes  her.  She takes care of her 66-month-old grandson [name].  He is  not in day care.  [name] went to the [facility] and was  found to have a temperature of 102.9.  She was also found to be  tachycardiac and had a questionable right upper lobe pneumonia on chest  x-ray and she was sent for direct admission.   PAST MEDICAL HISTORY:  1. Hypertension.  2. Hyperlipidemia.  3. Reflux.  4. MRSA infection of the left axilla that was I and D in 2007, for      which she took a prolonged course of vancomycin IV.  5. Right-sided breast cancer, status post mastectomy Feb 26, 2007.      She received no chemo or radiation and is now taking tamoxifen.  6. Right knee arthroplasty October 2009  with plans to do a left knee      arthroplasty in 2010.  7. Right rib fracture with subsequent pneumothorax several years ago.   SOCIAL HISTORY:  She lives at home with her 11 year old grandson.  She  is retired.  She denies tobacco, alcohol, and drug use.   FAMILY HISTORY:  Significant for hypertension and hyperlipidemia.   MEDICATIONS:  1. Vicodin 7.5/750 p.o. every 4 hours as needed for pain.  2. Cyclobenzaprine 10 mg 1 t.i.d. p.r.n. muscle spasms.  3. Nexium 40 mg p.o. daily.  4. Benicar/hydrochlorothiazide 20/12.5 mg 1 p.o. daily.  5. Lipitor 20 mg daily.  6. Tamoxifen 20 mg daily.  7. Enablex 7.5 mg p.o. daily.  8. Mirapex 0.125 mg p.o. daily.  9. Nasonex 50 mcg 2 sprays in each nostril daily.  10.Multivitamin 1 daily.  11.Calcium with vitamin D 1 daily.  12.Correctol 5 mg 2 p.o. daily.   ALLERGIES:  MORPHINE  and PERCOCET.  Morphine causes nausea.   REVIEW OF SYSTEMS:  The patient endorses fevers, chills, decreased p.o.,  cough with sputum production, nausea, vomiting, diarrhea, incontinence  that is chronic for her, myalgia.  She denies headaches, sore throat,  chest pain, edema, orthopnea, dyspnea, wheezing, hemoptysis, dysuria,  hematuria, rashes, swelling, vision changes, weakness, dizziness,  polyuria, or polydipsia.   PHYSICAL EXAMINATION:  VITAL SIGNS:  Temperature 99.3, pulse 112,  respirations 18, BP 122/70, pulse ox 99% on room air.  GENERAL:  She is alert and oriented x3, in no apparent distress.  HEENT:  Normocephalic and atraumatic.  Pupils are equal, round, and  reactive to light.  Extraocular muscles intact.  Oropharynx with no  erythema or exudate.  Mucous membranes are dry.  NECK:  No JVD.  CARDIOVASCULAR:  Tachycardia.  No murmurs, rubs, or gallops.  LUNGS:  Clear to auscultation bilaterally.  ABDOMEN:  Soft, nontender, and nondistended.  Bowel sounds x4.  No  masses.  EXTREMITIES:  There are no cyanosis, clubbing, or edema.  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  NEUROLOGIC:  Cranial nerves II through XII are intact.  She has normal  strength in all extremities.  SKIN:  She had right breast reconstruction, healed left axillary I and D  scar, and a healed right hallux amputation of tip.   LABORATORY DATA:  No new labs and studies.  White blood cell count 22.8,  hemoglobin 11.6, hematocrit 35.5, platelet 365, and 85% neutrophils.  Glucose 135.  Rapid strep negative.  Chest x-ray at [facility] that was  reviewed with radiologist and compared to her prior chest x-ray in March  2009, questionable asymmetric opacity in the right upper lobe that is  not seen on lateral view, questionable bronchitic changes, no effusion.  Radiology suggests followup and clinically improving as we need to be  careful of METs with a patient with a history of breast cancer. 65   ASSESSMENT AND PLAN:  [name] is a 65 year old female presenting  with influenza-like illness versus community-acquired pneumonia.  1. ID.  Chest x-ray obtained from [place] and reviewed with our      radiologist suggest a questionable right upper lobe pneumonia.  The      patient complains of several weeks of productive cough and now has      fever as well as elevated white blood cell count at 22.8 and is      tachycardiac with a heart rate of 112.  We will treat her with      Tamiflu as well as Rocephin and azithromycin to cover community-      acquired pneumonia.  We will get a Gram stain culture and obtain      blood cultures prior to starting antibiotics.  We will control her      fever with Tylenol and recheck a CBC in the morning.  2. Dehydration.  The patient is clinically dehydrated.  We will check      a BUN and creatinine, and treat her with normal saline at 125 mL      per hour.  We will monitor her fluid status.  3. Hypertension.  We will hold her Benicar/hydrochlorothiazide until      we have a basic metabolic profile.  At this  time, her blood      pressure is stable.  4. Hyperlipidemia.  We will continue her Lipitor at her home dose.  5. Vomiting and  diarrhea.  We will treat her symptomatically with      Zofran and Imodium and monitor her.  6. Gastroesophageal reflux disease.  We will continue proton pump      inhibitors during her hospital stay.  7. History of breast cancer.  We will continue the patient's tamoxifen      and note her history of breast cancer while reviewing x-rays.  8. Restless leg syndrome.  We will continue the patient on dose of      Mirapex.  9. Overactive bladder.  We will continue the patient's home dose of      Enablex.  10.Fluids, electrolyte, nutrition, and gastrointestinal.  Regular diet      as tolerated.  We will treat with normal saline at 125 mL per hour      and check BNP.  11.Prophylaxis.  Lovenox for deep vein thrombosis.  Continue proton      pump inhibitors for gastroesophageal reflux disease.  12.Disposition, pending clinical improvement.       Helane Rima, MD  Electronically Signed      Adrienne Ramp, MD  Electronically Signed    EW/MEDQ  D:  10/02/2008  T:  10/03/2008  Job:  [id number]

## 2011-02-27 NOTE — Op Note (Signed)
[name], [name], M.D.   DATE OF BIRTH:  04/24/1946   DATE OF PROCEDURE:  12/03/2008  DATE OF DISCHARGE:                               OPERATIVE REPORT   PREOPERATIVE DIAGNOSIS:  End-stage degenerative joint disease, left  knee.   POSTOPERATIVE DIAGNOSIS:  End-stage degenerative joint disease, left  knee.   PRINCIPAL PROCEDURES:  1. Left total knee replacement with a Sigma system, size 3 femur, size      3 tibia, 10-mm bridging bearing with 38-mm all polyethylene      patella.  2. Computer-assisted left total knee replacement.   SURGEON:  Harvie Junior, MD.   ASSISTANT:  Marshia Ly, PA.   ANESTHESIA:  General.   BRIEF HISTORY:  [name] is a 65 year old female with a long history  of having had severe bilateral degenerative joint disease.  We treated  her with a right total knee previously and she had done well with that  because of continued complaints of pain in the left knee and failure of  all conservative cares.  She was taken to the operating room for left  total knee replacement.  Because of her young age need for a longevity  rim plain computer assistance was chosen to be used preoperatively and  was used throughout the case.   DESCRIPTION OF PROCEDURE:  The patient was taken to the operating room.  After adequate anesthesia was obtained with general anesthetic, the  patient was placed supine on the operating table.  The left leg was then  prepped and draped in usual sterile fashion.  Following this, the leg  was exsanguinated and a blood pressure tourniquet was inflated to 350  mmHg.  Following this, a midline incision was made.  Subcutaneous tissue  was dissected down to level of the extensor mechanism and a medial  parapatellar arthrotomy was undertaken.  At this point,  anterior and  posterior cruciate were excised as well as medial and lateral meniscus,  and retropatellar fat pad.  Attention was then turned to the knee where  the computer-assisted models were placed.  Two pins in the tibia, two  pins in the femur, and the arrays were then placed.  A long alignment  was then assessed and following this, registration process was  undertaken, this takes about 20 to 30 minutes under surgical procedure.  Once registration process was undertaken, the long alignment was  assessed.  At this point, the tibia was cut perpendicular to the long  axis with the femur was cut perpendicular to the anatomic axis and at  this point, spacer blocks were put in place.  A perfect neutral long  alignment was achieved with spacer blocks.  At this point, attention was  turned towards sizing the femur to size 2 to 3.  Anterior and posterior  cuts were made as well as chamfer cuts and a box cut.  Attention was  then turned to the tibia where the tibia was sized 2 to 3 and was at a  central portion drilled as well as the keel.  Once the keel was punched,  attention was turned towards the placement of  trial bearing, a 10-mm  trial was put in place and excellent range of motion was achieved.  The  leg and perfect neutral long alignment and the symmetric gap balance in  flexion and extension.  Attention was then turned to patella, which cut  down to a level of 13 and 38-mm pad was chosen and the lugs were drilled  for  this.  The lugs were then drilled and trial of femur.  At this  point, all of the components were removed.  The knee was thoroughly  irrigated, pulsatile lavage irrigation and final components were  cemented into place, size 3 femur, size 3 tibia, and 10-mm bridging  bearing trial was placed and a 38-mm all poly patella and patellar clamp  was placed.  The knee was held in about 10 degrees of flexion with a  pressure across the joint.  All excess bone cement was  removed and then  the cement was allowed to harden.  Once the cement was completely  hardened, the tourniquet was let down.  A trial bearing was removed and  the all bleeding was controlled with electrocautery.  A medium Hemovac  drain was placed.  The medial parapatellar arthrotomy was closed with 1  Vicryl running suture and the skin was closed with 2-0 Vicryl and 3-0  Monocryl subcuticular stitches.  Benzoin and Steri-Strips were applied.  A sterile compression dressing was applied.  The patient was taken to  recovery room where she was noted to be in satisfactory condition.  Of  note, Gus Puma assisted throughout the case.  He was instrumental in  exposing so that we could make a bone cuts and also made several bone  cuts during the case.  He did provide the closing services well to  minimize time in the operating room.  The estimated blood loss for this  procedure was less than 50 mL.      Harvie Junior, M.D.  Electronically Signed     JLG/MEDQ  D:  12/03/2008  T:  12/04/2008  Job:  [id number]

## 2011-02-27 NOTE — Discharge Summary (Signed)
[name], [name]               ACCOUNT NO.:  [id number]   MEDICAL RECORD NO.:  [id number]          PATIENT TYPE:  INP   LOCATION:  5039                         FACILITY:  [facility]   PHYSICIAN:  Harvie Junior, M.D.   DATE OF BIRTH:  April 24, 1946   DATE OF ADMISSION:  08/04/2008  DATE OF DISCHARGE:  08/08/2008                               DISCHARGE SUMMARY   ADMITTING DIAGNOSES:  1. End-stage degenerative joint disease, right knee.  2. Gastroesophageal reflux disease.  3. History of breast cancer diagnosed May 2008.  4. Chronic bladder difficulties.  5. Hypertension.  6. Chronic low back pain.   DISCHARGE DIAGNOSES:  1. End-stage degenerative joint disease, right knee.  2. Gastroesophageal reflux disease.  3. History of breast cancer diagnosed May 2008.  4. Chronic bladder difficulties.  5. Hypertension.  6. Chronic low back pain.  7. Acute blood anemia.   PROCEDURES IN HOSPITAL:  Right total knee arthroplasty, computer-  assisted, Jodi Geralds, MD, August 04, 2008.   BRIEF HISTORY:  [name] is a 65 year old female, who is well known to [place].  She has long history of right knee pain.  Standing x-rays of the right  knee shows that she has end-stage DJD, bone-on-bone.  She has no relief  with conservative treatment including injection therapy, modification of  activity, and medication because of her continued back pain and pain  with ambulation.  She was admitted for right total knee arthroplasty.   PERTINENT LABORATORY STUDIES:  EKG on admission on July 29, 2008,  showed sinus tachycardia, otherwise normal EKG.  Hemoglobin on admission  was 13.5, hematocrit 39.7, and WBC 9.9.  Postop day #1 hemoglobin was  10.4; #2, 10.2; #3, 9.1. Indices within normal limits.  Pro time on  admission was 12 seconds and INR of 0.9, PTT of 25.  On the date of  discharge, the pro time was 20.4 seconds and INR of 1.7.  CMET on  admission was essentially within normal limits.  BMET was checked on  postoperative day #1, was also within normal range.  Her preoperative  urinalysis showed few bacteria with some mucus present.  There were many  epithelial cells.   HOSPITAL COURSE:  The patient was admitted to the hospital, was given of  2 g of Ancef preoperatively as well as 80 mg of IV gentamicin.  She  underwent surgery that is well described in Dr. Stacy Gardner operative note  on the right knee.  She was started on PCA Dilaudid pump for pain  control and physical therapy was ordered.  CPM machine was used for knee  range of motion.  Coumadin was started for DVT prophylaxis as well.  On  postop day #1, she had some itching associated with the PCA Dilaudid,  this was changed to PCA fentanyl.  She is taking fluids without  difficulty.  She had low-grade fever of 100.3 and was then found to be  afebrile.  Vital signs were stable.  Hemoglobin was 10.4.  Her INR was  1.0.  She was seen by physical therapy for knee range of motion and  ambulation.  On postop day #2, she has felt a little better.  She is  taking fluids without difficulty.  A low-grade fever, vital signs were  stable.  Her right knee wound was benign.  Her dressing was changed.  Her Hemovac drain was pulled.  Her PCA fentanyl was discontinued along  with her  IV and she was continued in physical therapy.   On postop day #3, complained of good relief of her knee pain with  somewhat slow progress with physical therapy.  She had additional day of  therapy for home safety.  She was then discharged on postop day #4,  doing well.  She passed physical therapy goals.  Her INR was 1.6.  Her  incision from the right knee was benign and she was discharged home in  stable and improved condition.  Dressing was changed.  Her activity  status is weightbearing as tolerated on the right with a walker.  She  will have home health  physical therapy and home health RN for pro times  and Coumadin management x1 month postop.  She is given RX Percocet  5 mg  p.o. for pain and Coumadin per pharmacy protocol.  She will follow up  with Dr. Luiz Blare in the office in 2 weeks or sooner if any problems.      Marshia Ly, P.A.      Harvie Junior, M.D.  Electronically Signed    JB/MEDQ  D:  10/20/2008  T:  10/21/2008  Job:  [id number]   cc:   Angus G. Renard Matter, MD

## 2011-02-27 NOTE — Op Note (Signed)
[name], [name]               ACCOUNT NO.:  [id number]   MEDICAL RECORD NO.:  [id number]          PATIENT TYPE:  OIB   LOCATION:  2550                         FACILITY:  [facility]   PHYSICIAN:  Maisie Fus A. Cornett, M.D.DATE OF BIRTH:  19-Jun-1946   DATE OF PROCEDURE:  02/26/2007  DATE OF DISCHARGE:                               OPERATIVE REPORT   PREOPERATIVE DIAGNOSIS:  Right breast ductal carcinoma in situ,  multifocal.   POSTOPERATIVE DIAGNOSIS:  Right breast ductal carcinoma in situ,  multifocal.   PROCEDURES:  1. Right simple mastectomy.  2. Right sentinel lymph node mapping with injection of 4 mL of      methylene blue dye.   SURGEON:  Maisie Fus A. Cornett, MD   ASSISTANT:  Lorne Skeens. Hoxworth, MD.   Horace Porteous:  One 33 Blake drain to right mastectomy site.   SPECIMEN:  Right breast and right sentinel lymph node negative by touch  prep, to pathology.   ANESTHESIA:  General endotracheal anesthesia.   INDICATIONS FOR PROCEDURE:  The patient is a 65 year old female recently  diagnosed with ductal carcinoma in situ of her right breast.  This was  biopsy-proven.  After reviewing her mammogram and discussing with her  options, she wished to undergo a right simple mastectomy.  She had a  very large area in the right breast that involved microcalcifications  biopsy-proven to be DCIS  microcalcifications in the breast.  After  discussing this with her, she agreed to proceed.   DESCRIPTION OF PROCEDURE:  The patient was brought to the operating  room, placed supine.  After induction of general anesthesia, the right  breast was prepped and draped in a sterile fashion.  Methylene blue dye  4 mL were injected in the right subareolar position.  Massage in was  done.  A NeoProbe was used.  A spot in the right axilla was marked.  Dissection was carried down to the right axilla.  A blue, hot node was  identified.  This was removed to pathology for touch prep and found to  be negative for  any malignant cells.  The remainder of her background  counts in the right axilla were very low.  There were no counts in the  open the sternum nor of the clavicle.  Next, the mastectomy was done.  Both superior and inferior skin flaps were then oriented.  Of note, she  had a subpectoral implant.  We then took the superior flap down to the  clavicle, the inferior flap down to the inframammary fold.  We then took  the breast off the pectoralis major muscle in a medial to lateral  fashion, taking great care to stay out of the pectoralis muscle in order  to preserve her subpectoral implant, which she wished to have done.  Once the entire breast was then removed, it was sent to pathology.  The  axilla was then examined as well as the areas up under both flaps.  There was some oozing from under both flaps, controlled by cautery.  Irrigation was used and suctioned out.  The wound was closed in layers  using a 3-0 Vicryl stitch and then a 4-0 Monocryl subcuticular stitch.  Prior to closing through a separate stab wound, a 19 Blake drain was  placed and secured with 3-0 nylon.  After the skin was  closed, Steri-Strips and dry dressings were then applied.  All final  counts of sponge, needle and instruments were found be correct for this  portion of the case.  The patient was then awoken, taken to recovery in  satisfactory condition.      Thomas A. Cornett, M.D.  Electronically Signed     TAC/MEDQ  D:  02/26/2007  T:  02/26/2007  Job:  [id number]   cc:   Angus G. Renard Matter, MD

## 2011-02-27 NOTE — Discharge Summary (Signed)
[name], [name]               ACCOUNT NO.:  [id number]   MEDICAL RECORD NO.:  [id number]          PATIENT TYPE:  INP   LOCATION:  4715                         FACILITY:  [facility]   PHYSICIAN:  Leighton Roach McDiarmid, M.D.DATE OF BIRTH:  05/13/1946   DATE OF ADMISSION:  10/02/2008  DATE OF DISCHARGE:  10/04/2008                               DISCHARGE SUMMARY   PRIMARY CARE PHYSICIAN:  Angus G. Renard Matter, MD, in [place], [place]  [place].   DISCHARGE DIAGNOSES:  1. Influenza-like illness.  2. Community-acquired pneumonia.  3. Hypokalemia.  4. Hypertension.  5. Hyperlipidemia.  6. History of right mastectomy.  7. Overactive bladder.  8. Restless leg syndrome.  9. GERD.   DISCHARGE MEDICATIONS:  1. Nexium 40 mg daily.  2. Lipitor 20 mg daily.  3. Tamoxifen 20 mg daily.  4. Enablex 7.5 mg daily.  5. Mirapex 0.125 mg daily.  6. Nasonex 50 mcg 2 sprays in each nostril daily.  7. Phenergan 12.5 mg by mouth every 6 hours as needed for nausea.  8. Tamiflu 75 mg twice daily x3 days to finish 5-day course.  9. Azithromycin 500 mg by mouth x3 days to finish 5-day course.  10.The patient was asked to stop taking her      Benicar/hydrochlorothiazide 20/12.5 until she sees her PCP, Dr.      Renard Matter.   PROCEDURES:  CT of the chest with contrast on October 03, 2008:  1. Right upper lobe and right lower lobe pneumonia.  Most of the      airspace disease in the right upper lobe with reactive right hilar      and low right paratracheal adenopathy.  Follow up to ensure      radiographic clearing recommended.  Clearing is usually observed at      4-6 weeks.  2. A 6-mm ground-glass pulmonary nodule adjacent to the right major      fissure most likely represents subpleural lymph node; however,      followup is required.  If the patient is at high risk for      bronchogenic carcinoma, followup chest CT in 6-12 months is      recommended.   LABORATORY DATA:  Upon discharge, blood cultures x2  showed no growth to  date.  Respiratory culture showed normal oropharyngeal flora.  Magnesium  1.9, sodium 138, potassium 3.4, chloride 109, CO2 of 22, glucose 92, BUN  9, creatinine 0.58, calcium 7.7.  White blood cells 14.9, hemoglobin  8.8, hematocrit 27.1, and platelets 239.   BRIEF HOSPITAL COURSE:  Ms. Boehringer is an extremely pleasant 65 year old  woman that presented to the emergency department, that was admitted for  influenza-like illness.  1. Influenza-like illness.  The patient was admitted with an acute      onset of fever with a T-max of 103.1, chills, increased white blood      cell count at 24.7, nausea, vomiting, and diarrhea.  She was      treated symptomatically with Imodium and Phenergan as well as given      maintenance IV fluids and scheduled Tylenol.  Her  nausea, vomiting,      and diarrhea resolved 24 hours prior to her discharge from the      hospital.  She was discharged with a prescription for Phenergan and      given red flags for prompt quick return to the emergency      department.  2. Community-acquired pneumonia.  The patient has had a chronic cough      for several weeks.  Chest x-ray at [facility] indicated      that she may have indicated a possible right upper lobe/right      middle lobe pneumonia.  Because of the patient's history of breast      cancer, a CT of the chest with contrast was done to evaluate for      any abnormalities.  This chest CT did show right upper lobe, right      lower lobe pneumonia with reactive right hilar and low right      peritracheal adenopathy.  She was treated with 2 days of      ceftriaxone as well as azithromycin and Tamiflu.  She was      discharged on azithromycin and Tamiflu to continue a 5-day course      of each.  3. Hypokalemia.  The patient's initial BMP showed a potassium of 2.6.      She was given 4 runs of potassium as well as IV supplementation      with her maintenance fluid.  The subsequent  potassium check was      2.5.  She again was given 4 runs of potassium and IV maintenance      supplementation and her next potassium value was 3.4.  She was      given 20 more mEq of potassium before being discharged.  She was      asked to hold her Benicar/hydrochlorothiazide as her pressures were      normal and these medications affect her electrolytes.  She was      asked to hold these medications until she follows up with her      primary care physician, Dr. Renard Matter in [place].  4. Hypertension.  This remained stable throughout her stay.  We held      her Benicar/hydrochlorothiazide.  5. Hyperlipidemia.  We continued her Zocor throughout her stay.  6. History of right mastectomy.  She was noted that she is on      tamoxifen and this was continued through her stay.  She never      received chemotherapy or radiation for her right breast cancer.      Please note CT chest results above including the 6-mm ground-glass      pulmonary nodule adjacent to her right pulmonary fissure.  This was      discussed with the radiologist and determined that in the context      of a patient with history of right breast cancer, status post      mastectomy, that this should be followed up in 3 months.  He      recommended a repeat CT of the chest without contrast in 3 months      and noted that the patient could be reassured that this was likely      a subpleural lymph node.  7. Overactive bladder.  The patient was continued on her home dose of      Enablex.  8. Gastroesophageal reflux disease.  The patient was continued on PPI.  9. Restless leg syndrome.  The patient was continued on her home dose      of Mirapex.      Helane Rima, MD  Electronically Signed      Leighton Roach McDiarmid, M.D.  Electronically Signed    EW/MEDQ  D:  10/04/2008  T:  10/05/2008  Job:  [id number]

## 2011-02-27 NOTE — Op Note (Signed)
[name], [name]               ACCOUNT NO.:  [id number]   MEDICAL RECORD NO.:  [id number]          PATIENT TYPE:  INP   LOCATION:  5039                         FACILITY:  [facility]   PHYSICIAN:  [name], M.D.   DATE OF BIRTH:  1946/04/24   DATE OF PROCEDURE:  08/04/2008  DATE OF DISCHARGE:                               OPERATIVE REPORT   PREOPERATIVE DIAGNOSIS:  End-stage degenerative joint disease, right  knee.   POSTOPERATIVE DIAGNOSIS:  End-stage degenerative joint disease, right  knee.   PROCEDURE:  1. Right total knee replacement with a Sigma system size, 3 femur      size, 3 tibia, 12.5-mm bridging bearing, 38-mm all polyethylene      patella.  2. Computer-assisted right total knee replacement.   SURGEON:  Adrienne Junior, MD   ASSISTANT:  Adrienne Ly, PA   ANESTHESIA:  General.   BRIEF HISTORY:  [name] is a 65 year old female with a long history  of having had severe right knee pain.  We treated conservatively for a  long period of time.  Because of continued complaints of pain she was  ultimately taken to the operating room for right total knee replacement  because of her young age and longevity and the family felt that computer-  assisted alignment was critical and this was chosen to be used  preoperatively.   PROCEDURE:  The patient was taken to the operating room.  After adequate  anesthesia was obtained with general anesthetic, the patient was placed  supine on the operating table.  The right leg was prepped and draped in  usual sterile fashion.  Following this, the leg was exsanguinated and  blood pressure tourniquet was inflated to 350 mmHg.  Following this, a  midline incision was made.  Subcutaneous tissue was dissected down at  the level of the extensor mechanism.  A medial parapatellar arthrotomy  was undertaken.  Following this, medial and lateral meniscus were  removed as well as anterior and posterior cruciates and the  retropatellar fat  pad.  Following this, the computer-assisted modules  were placed, 2 pins in the tibia, 2 pins in the femur, and the arrays  were placed and the registration process was undertaken.  This adds  about 30 minutes to the surgical procedure.  Once the arrays were  placed, the long alignment was assessed as well as the flexion  contracture.  At this point under computer-assisted alignment, the tibia  was cut perpendicular to its long axis and the femur was cut  perpendicular to the anatomic axis.  The spacer blocks were then used to  assess the alignment as well as the gap balancing.  At this point,  attention was turned to the femur where the femur anterior and posterior  cuts were made as well as chamfer cuts and attention was turned to the  tibia, which was sized and drilled and keeled.  Once this was done, the  trial components were undertaken.  The trial bridging bearing was put in  place.  The neutral long alignment was assessed as well as the gaps  in  the flexion and extension under computer-assisted alignment.  Attention  was then turned to the patella, which was cut down at the level of 13 mm  and 38 mm all polyethylene patella.  This was chosen as an implant and  the lugs were drilled and the trial was used.  Once this completed, the  knee was put through a range of motion.  Excellent perfect long  alignment was achieved as well as perfect gap balance and  flexion and  extension.  Following this, the trial implants were removed.  The knee  was copiously and thoroughly irrigated with pulsatile lavage irrigator  and following this, the final components were cemented into place size 3  femur, size 3 tibia, 12.5-mm bridging bearing at 38-mm all poly patella.  The cement allowed to harden and the computer-assisted modules were  tested.  As the cement is hardening, showing perfect neutral long  alignment, perfect gap balance, and flexion/extension.  The computer  arrays were removed at this  point.  The cement was allowed to harden  with a trial of 12.5 implant in.  Once the cement was hardened, trial  implant was removed.  The tourniquet was let down.  All remaining bone  cement was removed.  The knee was copiously and thoroughly irrigated and  suctioned dry.  A medium Hemovac drain was placed and the medial  parapatellar arthrotomy was closed with 1 Vicryl running suture.  The  subcu was closed with 0 and 2-0 Vicryl and skin staples.  Sterile  compressive dressing was applied and the patient was taken to recovery  room and was noted to be in satisfactory condition.  Estimated blood  loss throughout the procedure was none.      [name], M.D.  Electronically Signed     JLG/MEDQ  D:  08/04/2008  T:  08/05/2008  Job:  [id number]

## 2011-03-02 NOTE — Discharge Summary (Signed)
NAMEFEBE, CHAMPA               ACCOUNT NO.:  [id number]   MEDICAL RECORD NO.:  [id number]          PATIENT TYPE:  INP   LOCATION:  5020                         FACILITY:  [facility]   PHYSICIAN:  Harvie Junior, M.D.   DATE OF BIRTH:  11-11-1945   DATE OF ADMISSION:  12/03/2008  DATE OF DISCHARGE:  12/07/2008                               DISCHARGE SUMMARY   ADMITTING DIAGNOSES:  1. End-stage degenerative joint disease, left knee.  2. Gastroesophageal reflux disease.  3. History of breast cancer, May 2008, currently stable.  4. Hypertension.  5. Chronic low back pain.   DISCHARGE DIAGNOSES:  1. End-stage degenerative joint disease, left knee.  2. Gastroesophageal reflux disease.  3. History of breast cancer, May 2008, currently stable.  4. Hypertension.  5. Chronic low back pain.  6. Acute blood loss anemia.   PROCEDURES IN HOSPITAL:  Left total knee arthroplasty, computer-  assisted, Harvie Junior, MD on December 03, 2008.   BRIEF HISTORY:  Emili is a 65 year old female well known to [place] with a  long history of left knee pain.  X-rays shows she has bone-on-bone  degenerative arthritis of the left knee.  She got no relief with  exhaustive conservative treatment including injection therapy,  medication, and modification of her activity.  She had persistent night  pain and pain with ambulation in her left knee, and based upon her  clinical and radiographic findings, she was felt to be a candidate for a  left total knee arthroplasty and she was admitted for this.   PERTINENT LABORATORY DATA:  Hemoglobin on admission was 12.2, hematocrit  35.8.  On postop day #1, her hemoglobin was 9.3, hematocrit was 27.0.  Postop day #2, was 8.8.  Postop day #3, her hemoglobin was 7.5 with  hematocrit of 21.6.  Three units of packed RBCs were transfused bringing  her hemoglobin on December 07, 2008, up to 10.3.  On admission, her  indices were within normal limits.  Protime on admission was  12.4  seconds with an INR of 0.9 and PTT of 25.  On the day of discharge, she  had an INR of 2.3 on Coumadin therapy for DVT prophylaxis.  Her CMET on  admission showed no abnormalities other than slightly elevated glucose  of 122.  On postop day #1, her sodium was 134, glucose 150, and  otherwise normal.  Urinalysis on admission showed many epithelials with  a trace of leuk esterase.  She had rare mucus present.  Three units of  packed RBCs were transfused on December 06, 2008, for her anemia.   HOSPITAL COURSE:  The patient underwent left total knee arthroplasty as  well described in Dr. Luiz Blare' operative note on December 03, 2008.  Preoperatively, she was given Ancef and IV gentamicin.  Postop, she was  given IV Ancef x3 doses.  She was put on a PCA, Dilaudid pump for pain  control; CPM machine for range of motion; and physical therapy was  ordered for walker ambulation, weightbearing as tolerated on the left.  On postop day #1, she had no complaint.  Her hemoglobin was stable at  9.3.  Her INR was 1.1.  Her BMET was stable.  She had appropriate pain.  She was gotten out of bed with physical therapy and range of motion was  begun for her knee with a CPM machine and with a therapist.  On postop  day #2, she stated she felt pretty good.  She was voiding and take p.o.  without difficulty, making progress with physical therapy.  She was  little hypotensive at 97/62 and her O2 sats were 94% on room air.  Her  hemoglobin was 8.8.  Her INR was 1.4.  Her dressing was changed and her  drain was pulled.  She was continued on oral iron for her anemia on this  date.  We rechecked the CBC the following day.  Her IV was converted to  a saline lock and she was continued on Coumadin for DVT prophylaxis.  On  postop day #3, she stated that she felt tired.  She was taking fluids  and voiding without difficulty.  She had not had a BM.  She had no chest  pain or shortness of breath.  Her vital signs were  stable.  She was  tachycardic at 108.  Her O2 sats were 93% on room air.  Her hemoglobin  was 7.5 and her INR was 2.1.  Because of her acute blood loss anemia, 3  units of packed RBCs were transfused.  She was continued with physical  therapy.  On postop day #4, December 07, 2008, she was feeling much  better.  She was ready to go home.  She was taking fluids and voiding  without difficulty.  She was afebrile.  Her vital signs were stable.  Her INR was 2.3.  Her hemoglobin was 10.3.  Her calf was soft and  nontender.  She was discharged home in improved condition.  She was on a  regular diet and was given RX Percocet 5 mg p.o. p.r.n. for pain and  Robaxin 750 mg p.r.n. for spasm.  She was given RX for Coumadin per  pharmacy protocol x1 month postop.  She will take oral iron b.i.d. at  home 325 mg daily.  She will follow up with Dr. Luiz Blare in the office in  10 days, sooner if any problems occur.      Marshia Ly, P.A.      Harvie Junior, M.D.  Electronically Signed    JB/MEDQ  D:  02/16/2009  T:  02/17/2009  Job:  [id number]   cc:   Angus G. Renard Matter, MD

## 2011-03-02 NOTE — H&P (Signed)
[name], [name]               ACCOUNT NO.:  [id number]   MEDICAL RECORD NO.:  [id number]          PATIENT TYPE:  AMB   LOCATION:  DAY                           FACILITY:  [facility]   PHYSICIAN:  J. Darreld Mclean, M.D. DATE OF BIRTH:  October 23, 1945   DATE OF ADMISSION:  DATE OF DISCHARGE:  LH                                HISTORY & PHYSICAL   CHIEF COMPLAINT:  Right knee pain.   The patient is a 65 year old with pain and tenderness in her right knee.  She has had pain and tenderness on and off in her right knee for over a year  and a half.  I saw her in May 2006 at the request of Dr. Renard Zimmerman with the  complaints of pain in the right knee.  She has had an MRI on February 05, 2005  showing degenerative joint disease but no meniscus injury.  I have been  treating her for DJD of the knee since that time.  The knee has  progressively gotten worse.  Recently, she has had acute pain and tenderness  in the knee with giving way.  I repeated the MRI on October 9.  It shows a  tear near the posterior horn of the medial meniscus and increasing  degenerative joint disease, more in the medial compartment of the knee.  She  had some mucoid degeneration of the ACL, small effusion of the knee.  I  informed her of the findings.  She wants to have arthroscopy of the knee.  She is status post arthroscopy of the knee in 1995.  She understands the  procedure.  She has some giving way of the knee and continued swelling.  The  risks and imponderables of the procedure were discussed.  She understands  and agreed to the procedure as outlined.   PAST HISTORY:  Positive for hypertension.  She denies any allergies.   She is currently taking Benicar 200 mg daily, Evista 60 mg daily, Nasonex 50  mg daily.  She takes Dulcolax.  Vitamins.  Calcium.  She takes Vicodin 5  p.r.n. pain and Flexeril 10 mg one to two a day.  She does not smoke or use  alcohol.   She is status post arthroscopy of the knee in 1995 and tarsal  tunnel surgery  on the right in 2000.  She denies any diseases than run in the family.  She  lives in [place] and she is single.   BP is 130/84, pulse 84, respirations 16, afebrile, height 5 feet 5 inches,  weight 152 pounds.  She is alert, cooperative, oriented.  HEENT:  Negative.  NECK:  Supple.  LUNGS:  Clear to P&A.  HEART:  Regular rhythm without murmur.  ABDOMEN:  Soft, nontender, without masses.  RIGHT KNEE:  Pain and tenderness with crepitus.  Range of motion is from 0-  95 degrees with pain.  She got more pain medially.  She has got crepitus to  the knee and a mild effusion.  She has got weakly positive McMurray's  medially.  Drawer sign is negative.  Other extremities negative.  CNS:  Intact.  SKIN:  Intact.   IMPRESSION:  1. Right knee medial meniscal tear, degenerative joint disease of the      right knee.  2. History of hypertension.   PLAN:  Arthroscopy of the right knee.  Discussed with the patient the  planned procedure, risks and imponderable.  Labs are pending.                                            ______________________________  J. Darreld Mclean, M.D.     JWK/MEDQ  D:  07/26/2006  T:  07/27/2006  Job:  [id number]

## 2011-03-02 NOTE — Consult Note (Signed)
[name], [name]               ACCOUNT NO.:  [id number]   MEDICAL RECORD NO.:  [id number]          PATIENT TYPE:  AMB   LOCATION:  DAY                           FACILITY:  [facility]   PHYSICIAN:  J. Darreld Mclean, M.D. DATE OF BIRTH:  [birth year]   DATE OF CONSULTATION:  08/02/2006  DATE OF DISCHARGE:  07/30/2006                                   CONSULTATION   REQUESTING PHYSICIAN:  The patient had surgery by me on Tuesday for  arthroscopy of the right knee.  She has done well; however, she has  developed an abscess in the left axilla and breast area.  Dr. Malvin Johns has  been treating this.  He is going to do an I&D later today.  When the patient  had surgery the other day, she did not tell me about this area and using the  crutches the last few days has not aggravated it and made it worse.  The  knee wound looks good.  There is no erythema and no purulence.  She has  slight effusion, of course.  After surgery today and perhaps tomorrow we can  begin physical therapy and do a range of activity to the right knee. She  will need to use a walker instead of crutches.  We will follow with you.           ______________________________  J. Darreld Mclean, M.D.     JWK/MEDQ  D:  08/02/2006  T:  08/02/2006  Job:  [id number]

## 2011-03-02 NOTE — Op Note (Signed)
[name], [name]               ACCOUNT NO.:  [id number]   MEDICAL RECORD NO.:  [id number]          PATIENT TYPE:  AMB   LOCATION:  DAY                           FACILITY:  [facility]   PHYSICIAN:  J. Darreld Mclean, M.D. DATE OF BIRTH:  [birth year]   DATE OF PROCEDURE:  07/30/2006  DATE OF DISCHARGE:                                 OPERATIVE REPORT   PREOPERATIVE DIAGNOSIS:  Tear of medial meniscus right knee.   POSTOPERATIVE DIAGNOSIS:  Tear of medial meniscus and lateral meniscus of  the right knee with loose body and ACL partial tear, degenerative joint  disease.   PROCEDURE:  Arthroscopy of the right knee, partial medial and lateral  meniscectomy, removal of loose body.   ANESTHESIA:  General.   SURGEON:  J. Darreld Mclean, M.D.   TOURNIQUET TIME:  53 minutes.   DRAINS:  None.   SPECIMENS:  Sent to pathology.   INDICATIONS FOR PROCEDURE:  The patient is a 65 year old female who has had  pain and tenderness of the right knee, giving way, and some locking.  She  has not progressed with conservative treatment.  MRI shows tear of the  medial meniscus.  Surgery is recommended.  She has degenerative joint  disease of the knee as well.   DESCRIPTION OF PROCEDURE:  The patient was taken to the holding area.  The  right knee was identified as the correct surgical site.  She placed a mark  on the right knee, and I placed a mark on the right knee.  She was brought  to the operating room and placed supine.  She was given general anesthesia.  Tourniquet and leg holder placed, deflated right upper thigh.  She was  prepped and draped in the usual manner.  We had a time-out reidentifying the  patient and identifying the right knee as the correct surgical site.   The leg was elevated after being prepped and draped and wrapped  circumferentially with Esmarch bandage.  Tourniquet inflated to 300 mmHg.  Esmarch bandage removed.  Inflow cannula inserted medially.  Ringer's  lactate instilled  into the knee by an infusion pump.  Arthroscope was  inserted laterally and the knee systematically examined.   FINDINGS:  The suprapatellar pouch had small degenerative changes.  Medially  there was a small loose body and a tear of the posterior horn of the medial  meniscus.  The anterior cruciate was partially torn and laterally there was  a significant tear of the lateral meniscus.  She had grade 3 changes  medially, grade 2-3 changes laterally, grade 3 changes around the patella.   The patient had the medial meniscus debrided with meniscal shaver and  meniscal punch and the loose body removed.  Around the anterior cruciate,  part of the tear was removed with meniscal shaver.  Laterally there was  significant tear anterior to posterior and it took a few minutes to get the  anterior most portion removed with meniscal shaver.  Using a punch and  meniscal shaver and then the Holmium laser, I was able to get a good smooth  contour in the posterior portion.  Permanent pictures were taken throughout  the procedure.  The knee was systemically reexamined and no new pathology  found.  The wounds were irrigated with remaining part of Ringer's lactate.  Wounds were reapproximated with 3-0 nylon in interrupted vertical mattress  manner.  Marcaine 0.25% instilled in each portal.  Sterile dressing applied,  bulky dressing applied, and knee immobilizer applied.  The patient was given  a prescription for Vicodin ES for pain.  I will see her in the office in  approximately 10 days to 2 weeks.  Physical therapy has been arranged.  For  any difficulties contact me through the office or hospital beeper system.           ______________________________  Shela Commons. Darreld Mclean, M.D.     JWK/MEDQ  D:  07/30/2006  T:  07/31/2006  Job:  [id number]

## 2011-03-02 NOTE — Discharge Summary (Signed)
NAMEADDISON, [name]               ACCOUNT NO.:  [id number]   MEDICAL RECORD NO.:  [id number]          PATIENT TYPE:  INP   LOCATION:  A302                          FACILITY:  [facility]   PHYSICIAN:  Barbaraann Barthel, M.D. DATE OF BIRTH:  07-Feb-1946   DATE OF ADMISSION:  08/01/2006  DATE OF DISCHARGE:  10/26/2007LH                                 DISCHARGE SUMMARY   PROCEDURES:  Incision and drainage of left axillary abscess with  debridement.   DIAGNOSIS:  Cellulitis and abscess left axilla (methicillin-resistant  Staphylococcus aureus).   SECONDARY DIAGNOSIS:  Status post right knee arthroscopy on July 30, 2006  by Dr. Tanae Lias.   This is a 65 year old white female who was admitted with a painful left  axilla. This had become increasingly more tender as she was walking with  crutches as she was two days post right knee arthroscopy. However, she  failed to tell Dr. Chikita Lias that she had had some discomfort and folliculitis  in her left axilla two days or so prior to her surgery, and when I saw her  she was considerably tender there after being referred from Dr. Renard Matter'  office for possibly an abscess in her axilla. She was admitted. Immediately  she was placed on vancomycin. Of great concern to me was the fact that she  had cellulitis and possibly an abscess in the presence of bilateral breast  implants that were placed in 2002. She was taken to surgery on August 02, 2006. This was debrided. The incision and drainage and cultures were  obtained, and the wound was packed open after copious lavage with 2 roll  gauze.   HOSPITAL PROGRESS:  The patient did very well postoperatively. The  cellulitis resolved within a couple of days of this, and she responded to  antibiotic therapy. The cultures came back that she had methicillin-  resistant staph aureus. This was also sensitive to other p.o. medications.  However, I did obtain an extramural consult with [facility] plastic surgery  department, Dr. Greggory Keen, who advised a full six week course of  vancomycin. Postoperatively she did well. She was discharged on the seventh  postoperative day when I was quite sure that there was no erythema, that the  wound was granulating very well, and that there was no involvement of her  breast implants.   CONSULTATIONS:  1. Were to the physical therapy department for a pulse lavage.  2. Dr. Renard Matter to follow her medically.  3. Dr. America Lias who also saw her as stated a couple of days post      laparoscopic knee arthroscopy.   Another consultation was to the [facility] PICC line service.   As stated, the PICC line was placed postoperatively, and we had  consultations as well to the pharmacy to follow her vancomycin levels. She  was started on 1 g IV q.12 h. This was followed by the pharmacy department.  Her hospital course remained unremarkable. She had no problems with this.  She tolerated the antibiotics well. She tolerated the procedures well, and  clinically she was doing very well. She had no  range of motion. She had some  paresthesias in the medial portion of her upper arm. She had good range of  motion, and all erythema from the cellulitis had resolved and there was no  drainage or foul smelling or any necrotic tissue at the time of discharge.  Her wound was granulating quite well.   LABORATORY DATA:  The patient was admitted with a white count 26.8 with an  H&H of 11.1 and 32.2. Her white count was down on the 20th was 13 with an  H&H of 9.2 and 26.8. Pathology revealed skin and subcutaneous tissue from  the debridement, and acute inflammatory tissue was noted. Portable chest x-  ray showed good position of the PICC tip in the superior vena cava, mild  pulmonary edema which was asymptomatic on the right.   DISCHARGE INSTRUCTIONS:  She is discharged on a regular diet. She is told to  increase her activity as tolerated. She is not permitted to go in a swimming  pool or  Jacuzzi or shower. She is not to do any sexual activity or heavy  lifting. She can drive. Arrangements will be made with physical therapy  department in [place] where she lives for pulse lavage to be done on a  daily basis and the wound to be packed with 2-inch roll gauze after lavage.   She will follow up with Dr. Rosie Lias concerning the right knee as needed. She  will follow up as well in [facility] for vancomycin to be administered  b.i.d. through her PICC line with appropriate consultations in follow-up  there.   REST OF MEDICATIONS:  She is to resume her medications as preoperatively per  Dr. Renard Matter and Dr. Zahrah Lias.      Barbaraann Barthel, M.D.  Electronically Signed     WB/MEDQ  D:  08/09/2006  T:  08/10/2006  Job:  [id number]   cc:   Angus G. Renard Matter, MD  Fax: [phone]   J. Darreld Mclean, M.D.  Fax: [phone]   PICC line service

## 2011-03-02 NOTE — Op Note (Signed)
[name], [name]               ACCOUNT NO.:  [id number]   MEDICAL RECORD NO.:  [id number]          PATIENT TYPE:  INP   LOCATION:  A302                          FACILITY:  [facility]   PHYSICIAN:  Barbaraann Barthel, M.D. DATE OF BIRTH:  1946/02/23   DATE OF PROCEDURE:  08/02/2006  DATE OF DISCHARGE:                                 OPERATIVE REPORT   DIAGNOSIS:  Left axillary abscess with cellulitis.   PROCEDURE:  Incision and drainage with cultures and lavage of left axillary  abscess with debridement of axillary tissue.   NOTE:  This is a 65 year old white female who presented to the office from  Dr. Lorenso Courier office with pain in her left axilla.  She gave a history of  having some tenderness present since Sunday and despite this, she underwent  the right knee arthroscopy with Dr. Francheska Lias on Tuesday and did not reveal to  him that she was having discomfort here.  She did well from that surgery and  then presented to Dr. Lorenso Courier office on Thursday with tenderness in her  left axilla.  She describes that she had a small lump in this area and this  was exacerbated after her surgery when she had to use crutches for her right  knee arthroscopy.  She presented with erythema over her left breast and  extreme tenderness in her left axilla, no drainage was noted.  She was  admitted immediately to the hospital thinking that possibly she had an  abscess in this area although this was not clinically palpable and she was  extremely difficult to examine for her discomfort.  We brought her into the  hospital, initiated vancomycin and I alerted Dr. Aleya Lias about her admission  as well.   We discussed the need to take her to surgery and examine this further.  I  suspected that she had an abscess in this area.  We initiated warm  compresses and vancomycin in the evening of her admission and the next  morning took her to surgery.   GROSS OPERATIVE FINDINGS:  The patient had what appeared to be a  discrete  abscess in the area of her left axilla.  This possibly initiated from an  area of folliculitis in her left axilla or possibly a sebaceous cyst.  This  area was excised and sent as specimen.  We opened this broadly in her axilla  and irrigated it with copious amounts of normal saline solution and debrided  any nonviable appearing tissue.  This did not seem to tract further medially  towards her breast implants.  I irrigated this copiously with normal saline  solution after obtaining cultures and packed this open with 2 inch Kling  gauze.  A sterile dressing was applied.   This patient will continue on vancomycin, I will still have Dr. Maleiyah Lias  follow her recent arthroscopy surgery and attend to this part of her exam.  She will be placed upon thrombotic prophylaxis precautions with Lovenox and  I will begin dressing changes with the physical therapy department with  pulse lavage and packing on a daily basis.  I  would like to refer her as  well to a Engineer, petroleum although her plastic surgeon has retired.  We will  find a Engineer, petroleum who will deal with this further should she need more  surgery and I will refer her as I am not going to  remove her implants.  The debridement went well.  There was minimal blood  loss. The patient received approximately 1000 mL of crystalloid  intraoperatively.  The wound was packed open and prior to closure, all  sponge, needle and instrument counts were found to be correct and there were  no complications.      Barbaraann Barthel, M.D.  Electronically Signed     WB/MEDQ  D:  08/02/2006  T:  08/03/2006  Job:  [id number]   cc:   Angus G. Renard Matter, MD  Fax: [phone]   J. Darreld Mclean, M.D.  Fax: [phone]   Physical Therapy dept

## 2011-03-13 ENCOUNTER — Ambulatory Visit
Admission: RE | Admit: 2011-03-13 | Discharge: 2011-03-13 | Disposition: A | Discharge status: Home / Self Care | Payer: Medicare Other | Source: Ambulatory Visit | Attending: Oncology | Admitting: Oncology

## 2011-03-13 DIAGNOSIS — Z0000 Encounter for general adult medical examination without abnormal findings: Secondary | ICD-10-CM

## 2011-05-03 ENCOUNTER — Other Ambulatory Visit: Payer: Self-pay | Admitting: Oncology

## 2011-05-03 ENCOUNTER — Encounter (HOSPITAL_BASED_OUTPATIENT_CLINIC_OR_DEPARTMENT_OTHER): Payer: Medicare Other | Admitting: Oncology

## 2011-05-03 DIAGNOSIS — Z170 Estrogen receptor positive status [ER+]: Secondary | ICD-10-CM

## 2011-05-03 DIAGNOSIS — D0590 Unspecified type of carcinoma in situ of unspecified breast: Secondary | ICD-10-CM

## 2011-05-03 LAB — CBC WITH DIFFERENTIAL/PLATELET
BASO%: 0.4 % (ref 0.0–2.0)
Basophils Absolute: 0 10*3/uL (ref 0.0–0.1)
EOS%: 1 % (ref 0.0–7.0)
Eosinophils Absolute: 0.1 10*3/uL (ref 0.0–0.5)
HCT: 31.6 % — ABNORMAL LOW (ref 34.8–46.6)
HGB: 10.9 g/dL — ABNORMAL LOW (ref 11.6–15.9)
LYMPH%: 34.2 % (ref 14.0–49.7)
MCH: 32.7 pg (ref 25.1–34.0)
MCHC: 34.7 g/dL (ref 31.5–36.0)
MCV: 94.4 fL (ref 79.5–101.0)
MONO#: 0.4 10*3/uL (ref 0.1–0.9)
MONO%: 6 % (ref 0.0–14.0)
NEUT#: 4.1 10*3/uL (ref 1.5–6.5)
NEUT%: 58.4 % (ref 38.4–76.8)
Platelets: 246 10*3/uL (ref 145–400)
RBC: 3.35 10*6/uL — ABNORMAL LOW (ref 3.70–5.45)
RDW: 13.8 % (ref 11.2–14.5)
WBC: 7 10*3/uL (ref 3.9–10.3)
lymph#: 2.4 10*3/uL (ref 0.9–3.3)

## 2011-05-04 LAB — COMPREHENSIVE METABOLIC PANEL
ALT: 17 U/L (ref 0–35)
AST: 16 U/L (ref 0–37)
Albumin: 4.1 g/dL (ref 3.5–5.2)
Alkaline Phosphatase: 59 U/L (ref 39–117)
BUN: 14 mg/dL (ref 6–23)
CO2: 24 mEq/L (ref 19–32)
Calcium: 9.1 mg/dL (ref 8.4–10.5)
Chloride: 105 mEq/L (ref 96–112)
Creatinine, Ser: 0.8 mg/dL (ref 0.50–1.10)
Glucose, Bld: 143 mg/dL — ABNORMAL HIGH (ref 70–99)
Potassium: 3 mEq/L — ABNORMAL LOW (ref 3.5–5.3)
Sodium: 142 mEq/L (ref 135–145)
Total Bilirubin: 0.5 mg/dL (ref 0.3–1.2)
Total Protein: 6.5 g/dL (ref 6.0–8.3)

## 2011-05-04 LAB — VITAMIN D 25 HYDROXY (VIT D DEFICIENCY, FRACTURES): Vit D, 25-Hydroxy: 57 ng/mL (ref 30–89)

## 2011-05-31 ENCOUNTER — Encounter (HOSPITAL_BASED_OUTPATIENT_CLINIC_OR_DEPARTMENT_OTHER)
Admission: RE | Admit: 2011-05-31 | Discharge: 2011-05-31 | Disposition: A | Discharge status: Home / Self Care | Payer: Medicare Other | Source: Ambulatory Visit | Attending: Orthopedic Surgery | Admitting: Orthopedic Surgery

## 2011-05-31 LAB — BASIC METABOLIC PANEL
BUN: 20 mg/dL (ref 6–23)
CO2: 29 mEq/L (ref 19–32)
Calcium: 9 mg/dL (ref 8.4–10.5)
Chloride: 103 mEq/L (ref 96–112)
Creatinine, Ser: 0.58 mg/dL (ref 0.50–1.10)
GFR calc Af Amer: >60 mL/min (ref >60)
GFR calc non Af Amer: >60 mL/min (ref >60)
Glucose, Bld: 105 mg/dL — ABNORMAL HIGH (ref 70–99)
Potassium: 3.9 mEq/L (ref 3.5–5.1)
Sodium: 138 mEq/L (ref 135–145)

## 2011-06-01 ENCOUNTER — Ambulatory Visit (HOSPITAL_BASED_OUTPATIENT_CLINIC_OR_DEPARTMENT_OTHER)
Admission: RE | Admit: 2011-06-01 | Discharge: 2011-06-01 | Disposition: A | Discharge status: Home / Self Care | Payer: Medicare Other | Source: Ambulatory Visit | Attending: Orthopedic Surgery | Admitting: Orthopedic Surgery

## 2011-06-01 DIAGNOSIS — Z01810 Encounter for preprocedural cardiovascular examination: Secondary | ICD-10-CM | POA: Insufficient documentation

## 2011-06-01 DIAGNOSIS — M67919 Unspecified disorder of synovium and tendon, unspecified shoulder: Secondary | ICD-10-CM | POA: Insufficient documentation

## 2011-06-01 DIAGNOSIS — M19019 Primary osteoarthritis, unspecified shoulder: Secondary | ICD-10-CM | POA: Insufficient documentation

## 2011-06-01 DIAGNOSIS — K219 Gastro-esophageal reflux disease without esophagitis: Secondary | ICD-10-CM | POA: Insufficient documentation

## 2011-06-01 DIAGNOSIS — Z79899 Other long term (current) drug therapy: Secondary | ICD-10-CM | POA: Insufficient documentation

## 2011-06-01 DIAGNOSIS — Z853 Personal history of malignant neoplasm of breast: Secondary | ICD-10-CM | POA: Insufficient documentation

## 2011-06-01 DIAGNOSIS — M25819 Other specified joint disorders, unspecified shoulder: Secondary | ICD-10-CM | POA: Insufficient documentation

## 2011-06-01 DIAGNOSIS — Z01812 Encounter for preprocedural laboratory examination: Secondary | ICD-10-CM | POA: Insufficient documentation

## 2011-06-01 DIAGNOSIS — M24119 Other articular cartilage disorders, unspecified shoulder: Secondary | ICD-10-CM | POA: Insufficient documentation

## 2011-06-01 DIAGNOSIS — M719 Bursopathy, unspecified: Secondary | ICD-10-CM | POA: Insufficient documentation

## 2011-06-01 DIAGNOSIS — I10 Essential (primary) hypertension: Secondary | ICD-10-CM | POA: Insufficient documentation

## 2011-06-01 LAB — POCT HEMOGLOBIN-HEMACUE: Hemoglobin: 13 g/dL (ref 12.0–15.0)

## 2011-06-21 NOTE — Op Note (Signed)
NAMEKELITA, Adrienne NO.:  [id number]  MEDICAL RECORD NO.:  [id number]  LOCATION:                                 FACILITY:  PHYSICIAN:  Harvie Junior, M.D.   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  PRINCIPAL PROCEDURES: 1. Arthroscopic subacromial decompression from the lateral and     posterior compartment. 2. Arthroscopic debridement of superior labrum anterior to posterior. 3. Tenolysis of the biceps tendon from within the glenohumeral joint. 4. Arthroscopic distal clavicle resection over 18 mm from an anterior     compartment.  SURGEON:  Harvie Junior, MD  ASSISTANT:  Orma Flaming and Manus Gunning PA student.  ANESTHESIA:  General.  BRIEF HISTORY:  Ms. Zimmerman is a 65 year old female with long history of having significant complaints of left shoulder pain.  We treated conservatively for a period of time.  Because of failure of conservative care, she was also taken to the operating room for subacromial decompression, distal clavicle resection, and debridement of the glenohumeral joint.  PROCEDURE IN DETAILS:  The patient was taken to the operating room. After adequate anesthesia was obtained with general anesthetic, the patient was placed supine on the operating table.  Left arm was prepped and draped in the usual sterile fashion.  Motion seemed to be decent at this point.  Following this a routine arthroscopic examination of the shoulder revealed there was significant inflammation of the biceps tendon and really was like a type 4 SLAP lesion with this being  pulled down into the joint.  Given the severe nature of arthritis which we encountered, no articular cartilage on the glenoid, minimal articular cartilage on the humeral side except all the way almost out of the rotator cuff insertion.  We felt that leaving any kind of soft tissue in this area made minimal sense.  At this point a tenolysis was performed to the biceps tendon with a small biter.  This was just allowed to let fly.  The superior labrum was then debrided anterior to posterior.  We moved the camera from front, looked in the back and got the posterior labrum debrided as well.  Rotator cuff undersurface looked certainly that I can some fray but intact fibers.  We cleaned this up as well in the glenohumeral joint, went down inferiorly and cleaned at the labrum and the articular cartilage remnants over there.  Once this was completed attention out of glenohumeral joint, the subacromial space, an anterolateral acromioplasty was performed.  From the lateral and posterior compartment, a distal clavicle resection performed over 18 mm from the anterior compartment.  Attention was then turned to the rotator cuff on the superior surface where there was some partial-thickness fray.  This was debrided as well as bursectomy being performed.  At this time the shoulder was copiously and thoroughly lavaged, suctioned dry, and all sterile portal  were closed with a bandage.  Sterile compressive dressing was applied.  The patient was taken to the recovery room and was noted to be in satisfactory condition.  Estimated blood loss from the procedure was none.     Harvie Junior, M.D.     Ranae Plumber  D:  06/01/2011  T:  06/01/2011  Job:  [id number]  Electronically Signed by Jodi Geralds M.D. on 06/21/2011 08:27:41 AM

## 2011-07-06 ENCOUNTER — Encounter (HOSPITAL_BASED_OUTPATIENT_CLINIC_OR_DEPARTMENT_OTHER): Payer: Medicare Other | Admitting: Oncology

## 2011-07-06 ENCOUNTER — Other Ambulatory Visit: Payer: Self-pay | Admitting: Oncology

## 2011-07-06 DIAGNOSIS — D0590 Unspecified type of carcinoma in situ of unspecified breast: Secondary | ICD-10-CM

## 2011-07-06 DIAGNOSIS — Z170 Estrogen receptor positive status [ER+]: Secondary | ICD-10-CM

## 2011-07-06 LAB — CBC WITH DIFFERENTIAL/PLATELET
BASO%: 0.5 % (ref 0.0–2.0)
Basophils Absolute: 0 10*3/uL (ref 0.0–0.1)
EOS%: 5.2 % (ref 0.0–7.0)
Eosinophils Absolute: 0.3 10*3/uL (ref 0.0–0.5)
HCT: 37.4 % (ref 34.8–46.6)
HGB: 12.7 g/dL (ref 11.6–15.9)
LYMPH%: 56 % — ABNORMAL HIGH (ref 14.0–49.7)
MCH: 31.4 pg (ref 25.1–34.0)
MCHC: 34 g/dL (ref 31.5–36.0)
MCV: 92.3 fL (ref 79.5–101.0)
MONO#: 0.5 10*3/uL (ref 0.1–0.9)
MONO%: 8.7 % (ref 0.0–14.0)
NEUT#: 1.6 10*3/uL (ref 1.5–6.5)
NEUT%: 29.6 % — ABNORMAL LOW (ref 38.4–76.8)
Platelets: 237 10*3/uL (ref 145–400)
RBC: 4.05 10*6/uL (ref 3.70–5.45)
RDW: 12.5 % (ref 11.2–14.5)
WBC: 5.5 10*3/uL (ref 3.9–10.3)
lymph#: 3.1 10*3/uL (ref 0.9–3.3)

## 2011-07-06 LAB — COMPREHENSIVE METABOLIC PANEL
ALT: 12 U/L (ref 0–35)
AST: 23 U/L (ref 0–37)
Albumin: 4.1 g/dL (ref 3.5–5.2)
Alkaline Phosphatase: 61 U/L (ref 39–117)
BUN: 18 mg/dL (ref 6–23)
CO2: 24 mEq/L (ref 19–32)
Calcium: 9.2 mg/dL (ref 8.4–10.5)
Chloride: 105 mEq/L (ref 96–112)
Creatinine, Ser: 0.72 mg/dL (ref 0.50–1.10)
Glucose, Bld: 107 mg/dL — ABNORMAL HIGH (ref 70–99)
Potassium: 4 mEq/L (ref 3.5–5.3)
Sodium: 140 mEq/L (ref 135–145)
Total Bilirubin: 0.4 mg/dL (ref 0.3–1.2)
Total Protein: 6.4 g/dL (ref 6.0–8.3)

## 2011-07-06 LAB — LACTATE DEHYDROGENASE: LDH: 163 U/L (ref 94–250)

## 2011-07-06 LAB — CANCER ANTIGEN 27.29: CA 27.29: 16 U/mL (ref 0–39)

## 2011-07-16 LAB — DIFFERENTIAL
Basophils Absolute: 0.1
Basophils Relative: 1
Eosinophils Absolute: 0.1
Eosinophils Relative: 1
Lymphocytes Relative: 28
Lymphs Abs: 2.8
Monocytes Absolute: 0.6
Monocytes Relative: 6
Neutro Abs: 6.3
Neutrophils Relative %: 64

## 2011-07-16 LAB — TYPE AND SCREEN
ABO/RH(D): O POS
Antibody Screen: NEGATIVE

## 2011-07-16 LAB — PROTIME-INR
INR: 0.9
INR: 1
INR: 1.3
INR: 1.6 — ABNORMAL HIGH
INR: 1.7 — ABNORMAL HIGH
Prothrombin Time: 12
Prothrombin Time: 13.5
Prothrombin Time: 16.5 — ABNORMAL HIGH
Prothrombin Time: 19.5 — ABNORMAL HIGH
Prothrombin Time: 20.4 — ABNORMAL HIGH

## 2011-07-16 LAB — CBC
HCT: 26 — ABNORMAL LOW
HCT: 30.2 — ABNORMAL LOW
HCT: 30.3 — ABNORMAL LOW
HCT: 39.7
Hemoglobin: 10.2 — ABNORMAL LOW
Hemoglobin: 10.4 — ABNORMAL LOW
Hemoglobin: 13.5
Hemoglobin: 9.1 — ABNORMAL LOW
MCHC: 33.5
MCHC: 34
MCHC: 34.5
MCHC: 35
MCV: 94
MCV: 95.2
MCV: 95.4
MCV: 96.9
Platelets: 222
Platelets: 224
Platelets: 239
Platelets: 342
RBC: 2.77 — ABNORMAL LOW
RBC: 3.13 — ABNORMAL LOW
RBC: 3.17 — ABNORMAL LOW
RBC: 4.17
RDW: 12.2
RDW: 12.3
RDW: 12.3
RDW: 12.3
WBC: 10.2
WBC: 12.7 — ABNORMAL HIGH
WBC: 13 — ABNORMAL HIGH
WBC: 9.9

## 2011-07-16 LAB — BASIC METABOLIC PANEL
BUN: 4 — ABNORMAL LOW
CO2: 31
Calcium: 8 — ABNORMAL LOW
Chloride: 98
Creatinine, Ser: 0.68
GFR calc Af Amer: >60
GFR calc non Af Amer: >60
Glucose, Bld: 150 — ABNORMAL HIGH
Potassium: 3.4 — ABNORMAL LOW
Sodium: 135

## 2011-07-16 LAB — COMPREHENSIVE METABOLIC PANEL
ALT: 24
AST: 26
Albumin: 3.9
Alkaline Phosphatase: 71
BUN: 9
CO2: 28
Calcium: 9.3
Chloride: 101
Creatinine, Ser: 0.62
GFR calc Af Amer: >60
GFR calc non Af Amer: >60
Glucose, Bld: 114 — ABNORMAL HIGH
Potassium: 3.4 — ABNORMAL LOW
Sodium: 139
Total Bilirubin: 0.2 — ABNORMAL LOW
Total Protein: 6.7

## 2011-07-16 LAB — URINALYSIS, ROUTINE W REFLEX MICROSCOPIC
Bilirubin Urine: NEGATIVE
Glucose, UA: NEGATIVE
Hgb urine dipstick: NEGATIVE
Ketones, ur: NEGATIVE
Nitrite: NEGATIVE
Protein, ur: NEGATIVE
Specific Gravity, Urine: 1.015
Urobilinogen, UA: 0.2
pH: 7

## 2011-07-16 LAB — URINE MICROSCOPIC-ADD ON

## 2011-07-16 LAB — ABO/RH: ABO/RH(D): O POS

## 2011-07-16 LAB — APTT: aPTT: 25

## 2011-07-20 LAB — DIFFERENTIAL
Basophils Absolute: 0 10*3/uL (ref 0.0–0.1)
Basophils Relative: 0 % (ref 0–1)
Eosinophils Absolute: 0.2 10*3/uL (ref 0.0–0.7)
Eosinophils Relative: 1 % (ref 0–5)
Lymphocytes Relative: 22 % (ref 12–46)
Lymphs Abs: 3.2 10*3/uL (ref 0.7–4.0)
Monocytes Absolute: 0.8 10*3/uL (ref 0.1–1.0)
Monocytes Relative: 5 % (ref 3–12)
Neutro Abs: 10.7 10*3/uL — ABNORMAL HIGH (ref 1.7–7.7)
Neutrophils Relative %: 72 % (ref 43–77)

## 2011-07-20 LAB — CULTURE, RESPIRATORY W GRAM STAIN: Culture: NORMAL

## 2011-07-20 LAB — BASIC METABOLIC PANEL
BUN: 13 mg/dL (ref 6–23)
BUN: 14 mg/dL (ref 6–23)
BUN: 15 mg/dL (ref 6–23)
BUN: 9 mg/dL (ref 6–23)
CO2: 22 mEq/L (ref 19–32)
CO2: 22 mEq/L (ref 19–32)
CO2: 24 mEq/L (ref 19–32)
CO2: 24 mEq/L (ref 19–32)
Calcium: 7.7 mg/dL — ABNORMAL LOW (ref 8.4–10.5)
Calcium: 7.7 mg/dL — ABNORMAL LOW (ref 8.4–10.5)
Calcium: 7.8 mg/dL — ABNORMAL LOW (ref 8.4–10.5)
Calcium: 8.3 mg/dL — ABNORMAL LOW (ref 8.4–10.5)
Chloride: 101 mEq/L (ref 96–112)
Chloride: 103 mEq/L (ref 96–112)
Chloride: 109 mEq/L (ref 96–112)
Chloride: 99 mEq/L (ref 96–112)
Creatinine, Ser: 0.58 mg/dL (ref 0.4–1.2)
Creatinine, Ser: 0.65 mg/dL (ref 0.4–1.2)
Creatinine, Ser: 0.71 mg/dL (ref 0.4–1.2)
Creatinine, Ser: 0.73 mg/dL (ref 0.4–1.2)
GFR calc Af Amer: >60 mL/min (ref >60)
GFR calc Af Amer: >60 mL/min (ref >60)
GFR calc Af Amer: >60 mL/min (ref >60)
GFR calc Af Amer: >60 mL/min (ref >60)
GFR calc non Af Amer: >60 mL/min (ref >60)
GFR calc non Af Amer: >60 mL/min (ref >60)
GFR calc non Af Amer: >60 mL/min (ref >60)
GFR calc non Af Amer: >60 mL/min (ref >60)
Glucose, Bld: 122 mg/dL — ABNORMAL HIGH (ref 70–99)
Glucose, Bld: 140 mg/dL — ABNORMAL HIGH (ref 70–99)
Glucose, Bld: 141 mg/dL — ABNORMAL HIGH (ref 70–99)
Glucose, Bld: 92 mg/dL (ref 70–99)
Potassium: 2.5 mEq/L — CL (ref 3.5–5.1)
Potassium: 2.6 mEq/L — CL (ref 3.5–5.1)
Potassium: 3.4 mEq/L — ABNORMAL LOW (ref 3.5–5.1)
Potassium: 3.4 mEq/L — ABNORMAL LOW (ref 3.5–5.1)
Sodium: 134 mEq/L — ABNORMAL LOW (ref 135–145)
Sodium: 134 mEq/L — ABNORMAL LOW (ref 135–145)
Sodium: 134 mEq/L — ABNORMAL LOW (ref 135–145)
Sodium: 138 mEq/L (ref 135–145)

## 2011-07-20 LAB — CULTURE, BLOOD (ROUTINE X 2)
Culture: NO GROWTH
Culture: NO GROWTH

## 2011-07-20 LAB — CBC
HCT: 27.1 % — ABNORMAL LOW (ref 36.0–46.0)
HCT: 29.4 % — ABNORMAL LOW (ref 36.0–46.0)
Hemoglobin: 8.8 g/dL — ABNORMAL LOW (ref 12.0–15.0)
Hemoglobin: 9.8 g/dL — ABNORMAL LOW (ref 12.0–15.0)
MCHC: 32.5 g/dL (ref 30.0–36.0)
MCHC: 33.3 g/dL (ref 30.0–36.0)
MCV: 89.7 fL (ref 78.0–100.0)
MCV: 90.8 fL (ref 78.0–100.0)
Platelets: 239 10*3/uL (ref 150–400)
Platelets: 253 10*3/uL (ref 150–400)
RBC: 2.98 MIL/uL — ABNORMAL LOW (ref 3.87–5.11)
RBC: 3.28 MIL/uL — ABNORMAL LOW (ref 3.87–5.11)
RDW: 13.6 % (ref 11.5–15.5)
RDW: 13.9 % (ref 11.5–15.5)
WBC: 14.9 10*3/uL — ABNORMAL HIGH (ref 4.0–10.5)
WBC: 24.7 10*3/uL — ABNORMAL HIGH (ref 4.0–10.5)

## 2011-07-20 LAB — MAGNESIUM
Magnesium: 1.6 mg/dL (ref 1.5–2.5)
Magnesium: 1.9 mg/dL (ref 1.5–2.5)

## 2011-07-20 LAB — EXPECTORATED SPUTUM ASSESSMENT W GRAM STAIN, RFLX TO RESP C

## 2012-02-18 ENCOUNTER — Other Ambulatory Visit: Payer: Self-pay | Admitting: Family Medicine

## 2012-02-18 DIAGNOSIS — Z9011 Acquired absence of right breast and nipple: Secondary | ICD-10-CM

## 2012-02-18 DIAGNOSIS — Z1231 Encounter for screening mammogram for malignant neoplasm of breast: Secondary | ICD-10-CM

## 2012-02-28 ENCOUNTER — Other Ambulatory Visit: Payer: Self-pay | Admitting: Orthopedic Surgery

## 2012-02-29 ENCOUNTER — Encounter (HOSPITAL_COMMUNITY)
Admission: RE | Admit: 2012-02-29 | Discharge: 2012-02-29 | Disposition: A | Discharge status: Home / Self Care | Payer: Medicare Other | Source: Ambulatory Visit | Attending: Orthopedic Surgery | Admitting: Orthopedic Surgery

## 2012-02-29 ENCOUNTER — Encounter (HOSPITAL_COMMUNITY): Payer: Self-pay | Admitting: Pharmacy Technician

## 2012-02-29 ENCOUNTER — Encounter (HOSPITAL_COMMUNITY): Payer: Self-pay

## 2012-02-29 HISTORY — DX: Gastro-esophageal reflux disease without esophagitis: K21.9

## 2012-02-29 HISTORY — DX: Unspecified osteoarthritis, unspecified site: M19.90

## 2012-02-29 HISTORY — DX: Peripheral vascular disease, unspecified: I73.9

## 2012-02-29 LAB — CBC
HCT: 36.3 % (ref 36.0–46.0)
Hemoglobin: 12.3 g/dL (ref 12.0–15.0)
MCH: 32.7 pg (ref 26.0–34.0)
MCHC: 33.9 g/dL (ref 30.0–36.0)
MCV: 96.5 fL (ref 78.0–100.0)
Platelets: 312 10*3/uL (ref 150–400)
RBC: 3.76 MIL/uL — ABNORMAL LOW (ref 3.87–5.11)
RDW: 12.2 % (ref 11.5–15.5)
WBC: 8.8 10*3/uL (ref 4.0–10.5)

## 2012-02-29 LAB — COMPREHENSIVE METABOLIC PANEL
ALT: 36 U/L — ABNORMAL HIGH (ref 0–35)
AST: 32 U/L (ref 0–37)
Albumin: 4.1 g/dL (ref 3.5–5.2)
Alkaline Phosphatase: 74 U/L (ref 39–117)
BUN: 16 mg/dL (ref 6–23)
CO2: 30 mEq/L (ref 19–32)
Calcium: 10.5 mg/dL (ref 8.4–10.5)
Chloride: 102 mEq/L (ref 96–112)
Creatinine, Ser: 0.78 mg/dL (ref 0.50–1.10)
GFR calc Af Amer: >90 mL/min (ref >90)
GFR calc non Af Amer: 85 mL/min — ABNORMAL LOW (ref >90)
Glucose, Bld: 92 mg/dL (ref 70–99)
Potassium: 3.8 mEq/L (ref 3.5–5.1)
Sodium: 142 mEq/L (ref 135–145)
Total Bilirubin: 0.3 mg/dL (ref 0.3–1.2)
Total Protein: 7.1 g/dL (ref 6.0–8.3)

## 2012-02-29 LAB — APTT: aPTT: 30 seconds (ref 24–37)

## 2012-02-29 LAB — TYPE AND SCREEN
ABO/RH(D): O POS
Antibody Screen: NEGATIVE

## 2012-02-29 LAB — PROTIME-INR
INR: 0.93 (ref 0.00–1.49)
Prothrombin Time: 12.7 seconds (ref 11.6–15.2)

## 2012-02-29 LAB — SURGICAL PCR SCREEN
MRSA, PCR: NEGATIVE
Staphylococcus aureus: POSITIVE — AB

## 2012-02-29 NOTE — Pre-Procedure Instructions (Signed)
20 [name]  02/29/2012   Your procedure is scheduled on:  Mar 14, 2012 0730 AM Friday  Report to [facility] at 0530AM.  Call this number if you have problems the morning of surgery: [phone]   Remember:   Do not eat food:After Midnight.Thursday  May have clear liquids: up to 4 Hours before arrival.0130 AM  Clear liquids include soda, tea, black coffee, apple or grape juice, broth.  Take these medicines the morning of surgery with A SIP OF WATER:Hydrocodone-Acetaminophen[Norco]       Do not wear jewelry, make-up or nail polish.  Do not wear lotions, powders, or perfumes. You may wear deodorant.  Do not shave 48 hours prior to surgery. Men may shave face and neck.  Do not bring valuables to the hospital.  Contacts, dentures or bridgework may not be worn into surgery.  Leave suitcase in the car. After surgery it may be brought to your room.  For patients admitted to the hospital, checkout time is 11:00 AM the day of discharge.   Patients discharged the day of surgery will not be allowed to drive home.  Name and phone number of your driver: Brett Canales [phone]  Special Instructions: Incentive Spirometry - Practice and bring it with you on the day of surgery. and CHG Shower Use Special Wash: 1/2 bottle night before surgery and 1/2 bottle morning of surgery.   Please read over the following fact sheets that you were given: Pain Booklet, Coughing and Deep Breathing, Blood Transfusion Information, MRSA Information and Surgical Site Infection Prevention

## 2012-03-01 LAB — URINE CULTURE
Colony Count: NO GROWTH
Culture  Setup Time: 201305171642
Culture: NO GROWTH

## 2012-03-03 NOTE — Progress Notes (Signed)
Spoke to susan at Dr. Luiz Blare' office,stated the orders are pending and he needs to sign and hold them.

## 2012-03-07 NOTE — Progress Notes (Signed)
Notified susan at DR GRAVES OFFICE RE ORDERS BEING SIGHNED AND HELD .  SUSAN WILL SEND ANOTHER NOTE TO DR GRAVES.

## 2012-03-13 ENCOUNTER — Ambulatory Visit
Admission: RE | Admit: 2012-03-13 | Discharge: 2012-03-13 | Disposition: A | Discharge status: Home / Self Care | Payer: Medicare Other | Source: Ambulatory Visit | Attending: Family Medicine | Admitting: Family Medicine

## 2012-03-13 DIAGNOSIS — Z9011 Acquired absence of right breast and nipple: Secondary | ICD-10-CM

## 2012-03-13 DIAGNOSIS — Z1231 Encounter for screening mammogram for malignant neoplasm of breast: Secondary | ICD-10-CM

## 2012-03-13 MED ORDER — CEFAZOLIN SODIUM-DEXTROSE 2-3 GM-% IV SOLR
2.0000 g | INTRAVENOUS | Status: AC
  Administered 2012-03-14: 2 g via INTRAVENOUS
  Filled 2012-03-13: qty 50

## 2012-03-13 MED ORDER — POVIDONE-IODINE 7.5 % EX SOLN
Freq: Once | CUTANEOUS | Status: DC
  Filled 2012-03-13: qty 118

## 2012-03-14 ENCOUNTER — Encounter (HOSPITAL_COMMUNITY)
Admission: RE | Disposition: A | Discharge status: Home / Self Care | Payer: Self-pay | Source: Ambulatory Visit | Attending: Orthopedic Surgery

## 2012-03-14 ENCOUNTER — Inpatient Hospital Stay (HOSPITAL_COMMUNITY): Payer: Medicare Other

## 2012-03-14 ENCOUNTER — Encounter (HOSPITAL_COMMUNITY): Payer: Self-pay | Admitting: Anesthesiology

## 2012-03-14 ENCOUNTER — Encounter (HOSPITAL_COMMUNITY): Payer: Self-pay | Admitting: Surgery

## 2012-03-14 ENCOUNTER — Ambulatory Visit (HOSPITAL_COMMUNITY): Payer: Medicare Other | Admitting: Anesthesiology

## 2012-03-14 ENCOUNTER — Inpatient Hospital Stay (HOSPITAL_COMMUNITY)
Admission: RE | Admit: 2012-03-14 | Discharge: 2012-03-16 | DRG: 484 | Disposition: A | Discharge status: Home / Self Care | Payer: Medicare Other | Source: Ambulatory Visit | Attending: Orthopedic Surgery | Admitting: Orthopedic Surgery

## 2012-03-14 DIAGNOSIS — Z9010 Acquired absence of unspecified breast and nipple: Secondary | ICD-10-CM

## 2012-03-14 DIAGNOSIS — M19012 Primary osteoarthritis, left shoulder: Secondary | ICD-10-CM | POA: Diagnosis present

## 2012-03-14 DIAGNOSIS — M19019 Primary osteoarthritis, unspecified shoulder: Principal | ICD-10-CM | POA: Diagnosis present

## 2012-03-14 DIAGNOSIS — K219 Gastro-esophageal reflux disease without esophagitis: Secondary | ICD-10-CM | POA: Diagnosis present

## 2012-03-14 DIAGNOSIS — I10 Essential (primary) hypertension: Secondary | ICD-10-CM | POA: Diagnosis present

## 2012-03-14 DIAGNOSIS — I739 Peripheral vascular disease, unspecified: Secondary | ICD-10-CM | POA: Diagnosis present

## 2012-03-14 DIAGNOSIS — Z96659 Presence of unspecified artificial knee joint: Secondary | ICD-10-CM

## 2012-03-14 DIAGNOSIS — Z8614 Personal history of Methicillin resistant Staphylococcus aureus infection: Secondary | ICD-10-CM

## 2012-03-14 HISTORY — PX: TOTAL SHOULDER ARTHROPLASTY: SHX126

## 2012-03-14 LAB — URINALYSIS, ROUTINE W REFLEX MICROSCOPIC
Bilirubin Urine: NEGATIVE
Glucose, UA: NEGATIVE mg/dL
Hgb urine dipstick: NEGATIVE
Ketones, ur: NEGATIVE mg/dL
Nitrite: NEGATIVE
Protein, ur: NEGATIVE mg/dL
Specific Gravity, Urine: 1.021 (ref 1.005–1.030)
Urobilinogen, UA: 0.2 mg/dL (ref 0.0–1.0)
pH: 5 (ref 5.0–8.0)

## 2012-03-14 LAB — URINE MICROSCOPIC-ADD ON

## 2012-03-14 SURGERY — ARTHROPLASTY, SHOULDER, TOTAL
Anesthesia: Regional | Site: Shoulder | Laterality: Left | Wound class: Clean

## 2012-03-14 MED ORDER — OLMESARTAN MEDOXOMIL-HCTZ 20-12.5 MG PO TABS: 1.0000 | ORAL_TABLET | Freq: Every day | ORAL | Status: DC

## 2012-03-14 MED ORDER — GLYCOPYRROLATE 0.2 MG/ML IJ SOLN
INTRAMUSCULAR | Status: DC | PRN
  Administered 2012-03-14: .2 mg via INTRAVENOUS

## 2012-03-14 MED ORDER — DIPHENHYDRAMINE HCL 50 MG/ML IJ SOLN: 12.5000 mg | Freq: Four times a day (QID) | INTRAMUSCULAR | Status: DC | PRN

## 2012-03-14 MED ORDER — DEXAMETHASONE SODIUM PHOSPHATE 10 MG/ML IJ SOLN
INTRAMUSCULAR | Status: DC | PRN
  Administered 2012-03-14: 8 mg via INTRAVENOUS

## 2012-03-14 MED ORDER — NALOXONE HCL 0.4 MG/ML IJ SOLN: 0.4000 mg | INTRAMUSCULAR | Status: DC | PRN

## 2012-03-14 MED ORDER — HYDROCHLOROTHIAZIDE 12.5 MG PO CAPS
12.5000 mg | ORAL_CAPSULE | Freq: Every day | ORAL | Status: DC
  Administered 2012-03-14 – 2012-03-16 (×3): 12.5 mg via ORAL
  Filled 2012-03-14 (×3): qty 1

## 2012-03-14 MED ORDER — BISACODYL 5 MG PO TBEC
5.0000 mg | DELAYED_RELEASE_TABLET | Freq: Every day | ORAL | Status: DC
  Administered 2012-03-14: 5 mg via ORAL
  Administered 2012-03-15: 10 mg via ORAL
  Filled 2012-03-14: qty 2
  Filled 2012-03-14 (×2): qty 1

## 2012-03-14 MED ORDER — ZOLPIDEM TARTRATE 5 MG PO TABS: 5.0000 mg | ORAL_TABLET | Freq: Every evening | ORAL | Status: DC | PRN

## 2012-03-14 MED ORDER — MIDAZOLAM HCL 5 MG/5ML IJ SOLN
INTRAMUSCULAR | Status: DC | PRN
  Administered 2012-03-14: 2 mg via INTRAVENOUS

## 2012-03-14 MED ORDER — FENTANYL 10 MCG/ML IV SOLN
INTRAVENOUS | Status: DC
  Administered 2012-03-14: 10 ug via INTRAVENOUS
  Administered 2012-03-14: 60 mL via INTRAVENOUS
  Administered 2012-03-15 (×2): 10 ug via INTRAVENOUS
  Filled 2012-03-14: qty 50

## 2012-03-14 MED ORDER — DOCUSATE SODIUM 100 MG PO CAPS
100.0000 mg | ORAL_CAPSULE | Freq: Two times a day (BID) | ORAL | Status: DC
  Administered 2012-03-14 – 2012-03-16 (×3): 100 mg via ORAL
  Filled 2012-03-14 (×5): qty 1

## 2012-03-14 MED ORDER — BUPIVACAINE-EPINEPHRINE PF 0.5-1:200000 % IJ SOLN
INTRAMUSCULAR | Status: DC | PRN
  Administered 2012-03-14: 150 mg

## 2012-03-14 MED ORDER — ONDANSETRON HCL 4 MG PO TABS
4.0000 mg | ORAL_TABLET | Freq: Four times a day (QID) | ORAL | Status: DC | PRN
  Administered 2012-03-16: 4 mg via ORAL
  Filled 2012-03-14: qty 1

## 2012-03-14 MED ORDER — ONDANSETRON HCL 4 MG/2ML IJ SOLN: 4.0000 mg | Freq: Four times a day (QID) | INTRAMUSCULAR | Status: DC | PRN

## 2012-03-14 MED ORDER — PHENYLEPHRINE HCL 10 MG/ML IJ SOLN
10.0000 mg | INTRAVENOUS | Status: DC | PRN
  Administered 2012-03-14: 10 ug/min via INTRAVENOUS

## 2012-03-14 MED ORDER — DROPERIDOL 2.5 MG/ML IJ SOLN: 0.6250 mg | INTRAMUSCULAR | Status: DC | PRN

## 2012-03-14 MED ORDER — FENTANYL CITRATE 0.05 MG/ML IJ SOLN
25.0000 ug | INTRAMUSCULAR | Status: DC | PRN
  Administered 2012-03-14: 25 ug via INTRAVENOUS

## 2012-03-14 MED ORDER — ROCURONIUM BROMIDE 100 MG/10ML IV SOLN
INTRAVENOUS | Status: DC | PRN
  Administered 2012-03-14: 50 mg via INTRAVENOUS

## 2012-03-14 MED ORDER — ACETAMINOPHEN 10 MG/ML IV SOLN
INTRAVENOUS | Status: AC
  Filled 2012-03-14: qty 100

## 2012-03-14 MED ORDER — ACETAMINOPHEN 325 MG PO TABS: 650.0000 mg | ORAL_TABLET | Freq: Four times a day (QID) | ORAL | Status: DC | PRN

## 2012-03-14 MED ORDER — PROMETHAZINE HCL 25 MG/ML IJ SOLN: 12.5000 mg | Freq: Four times a day (QID) | INTRAMUSCULAR | Status: DC | PRN

## 2012-03-14 MED ORDER — OXYCODONE-ACETAMINOPHEN 5-325 MG PO TABS
1.0000 | ORAL_TABLET | ORAL | Status: DC | PRN
  Administered 2012-03-15 – 2012-03-16 (×5): 2 via ORAL
  Filled 2012-03-14 (×5): qty 2

## 2012-03-14 MED ORDER — DIPHENHYDRAMINE HCL 12.5 MG/5ML PO ELIX: 12.5000 mg | ORAL_SOLUTION | Freq: Four times a day (QID) | ORAL | Status: DC | PRN

## 2012-03-14 MED ORDER — KETOROLAC TROMETHAMINE 15 MG/ML IJ SOLN
15.0000 mg | Freq: Three times a day (TID) | INTRAMUSCULAR | Status: AC
  Administered 2012-03-14 – 2012-03-15 (×5): 15 mg via INTRAVENOUS
  Filled 2012-03-14 (×5): qty 1

## 2012-03-14 MED ORDER — ACETAMINOPHEN 10 MG/ML IV SOLN
INTRAVENOUS | Status: DC | PRN
  Administered 2012-03-14: 1000 mg via INTRAVENOUS

## 2012-03-14 MED ORDER — METHOCARBAMOL 500 MG PO TABS: 500.0000 mg | ORAL_TABLET | Freq: Four times a day (QID) | ORAL | Status: DC | PRN

## 2012-03-14 MED ORDER — ACETAMINOPHEN 10 MG/ML IV SOLN
1000.0000 mg | Freq: Four times a day (QID) | INTRAVENOUS | Status: AC
  Administered 2012-03-14 – 2012-03-15 (×2): 1000 mg via INTRAVENOUS
  Filled 2012-03-14 (×3): qty 100

## 2012-03-14 MED ORDER — METHOCARBAMOL 100 MG/ML IJ SOLN
500.0000 mg | Freq: Four times a day (QID) | INTRAMUSCULAR | Status: DC | PRN
  Filled 2012-03-14: qty 5

## 2012-03-14 MED ORDER — SODIUM CHLORIDE 0.9 % IR SOLN
Status: DC | PRN
  Administered 2012-03-14: 1000 mL

## 2012-03-14 MED ORDER — PROPOFOL 10 MG/ML IV EMUL
INTRAVENOUS | Status: DC | PRN
  Administered 2012-03-14: 200 mg via INTRAVENOUS

## 2012-03-14 MED ORDER — LACTATED RINGERS IV SOLN
INTRAVENOUS | Status: DC | PRN
  Administered 2012-03-14 (×2): via INTRAVENOUS

## 2012-03-14 MED ORDER — ONDANSETRON HCL 4 MG/2ML IJ SOLN
INTRAMUSCULAR | Status: DC | PRN
  Administered 2012-03-14: 4 mg via INTRAVENOUS

## 2012-03-14 MED ORDER — SODIUM CHLORIDE 0.9 % IJ SOLN: 9.0000 mL | INTRAMUSCULAR | Status: DC | PRN

## 2012-03-14 MED ORDER — NEOSTIGMINE METHYLSULFATE 1 MG/ML IJ SOLN
INTRAMUSCULAR | Status: DC | PRN
  Administered 2012-03-14: 1 mg via INTRAVENOUS

## 2012-03-14 MED ORDER — IRBESARTAN 150 MG PO TABS
150.0000 mg | ORAL_TABLET | Freq: Every day | ORAL | Status: DC
  Administered 2012-03-14 – 2012-03-16 (×3): 150 mg via ORAL
  Filled 2012-03-14 (×3): qty 1

## 2012-03-14 MED ORDER — DEXTROSE-NACL 5-0.45 % IV SOLN
INTRAVENOUS | Status: DC
  Administered 2012-03-14: 17:00:00 via INTRAVENOUS

## 2012-03-14 MED ORDER — SIMVASTATIN 40 MG PO TABS
40.0000 mg | ORAL_TABLET | Freq: Every day | ORAL | Status: DC
  Administered 2012-03-14 – 2012-03-15 (×2): 40 mg via ORAL
  Filled 2012-03-14 (×3): qty 1

## 2012-03-14 MED ORDER — ACETAMINOPHEN 650 MG RE SUPP: 650.0000 mg | Freq: Four times a day (QID) | RECTAL | Status: DC | PRN

## 2012-03-14 MED ORDER — CEFAZOLIN SODIUM-DEXTROSE 2-3 GM-% IV SOLR
2.0000 g | Freq: Four times a day (QID) | INTRAVENOUS | Status: AC
Start: 2012-03-14 — End: 2012-03-15
  Administered 2012-03-14 – 2012-03-15 (×3): 2 g via INTRAVENOUS
  Filled 2012-03-14 (×4): qty 50

## 2012-03-14 MED ORDER — FENTANYL CITRATE 0.05 MG/ML IJ SOLN
INTRAMUSCULAR | Status: DC | PRN
  Administered 2012-03-14: 100 ug via INTRAVENOUS
  Administered 2012-03-14 (×2): 50 ug via INTRAVENOUS

## 2012-03-14 SURGICAL SUPPLY — 71 items
APL SKNCLS STERI-STRIP NONHPOA (GAUZE/BANDAGES/DRESSINGS) ×1
BENZOIN TINCTURE PRP APPL 2/3 (GAUZE/BANDAGES/DRESSINGS) ×2 IMPLANT
BLADE SAW SAG 29X58X.64 (BLADE) ×2 IMPLANT
BLADE SURG ROTATE 9660 (MISCELLANEOUS) IMPLANT
BOWL SMART MIX CTS (DISPOSABLE) IMPLANT
CEMENT BONE DEPUY (Cement) ×1 IMPLANT
CLOTH BEACON ORANGE TIMEOUT ST (SAFETY) ×2 IMPLANT
CLSR STERI-STRIP ANTIMIC 1/2X4 (GAUZE/BANDAGES/DRESSINGS) ×1 IMPLANT
COVER SURGICAL LIGHT HANDLE (MISCELLANEOUS) ×2 IMPLANT
COVER TABLE BACK 60X90 (DRAPES) ×2 IMPLANT
DRAPE C-ARM 42X72 X-RAY (DRAPES) IMPLANT
DRAPE INCISE IOBAN 66X45 STRL (DRAPES) ×4 IMPLANT
DRAPE PROXIMA HALF (DRAPES) ×2 IMPLANT
DRAPE U-SHAPE 47X51 STRL (DRAPES) ×4 IMPLANT
DRILL BIT 7/64X5 (BIT) ×2 IMPLANT
DRSG MEPILEX BORDER 4X8 (GAUZE/BANDAGES/DRESSINGS) ×1 IMPLANT
DRSG PAD ABDOMINAL 8X10 ST (GAUZE/BANDAGES/DRESSINGS) ×4 IMPLANT
DURAPREP 26ML APPLICATOR (WOUND CARE) ×2 IMPLANT
ELECT CAUTERY BLADE 6.4 (BLADE) IMPLANT
ELECT NDL TIP 2.8 STRL (NEEDLE) ×1 IMPLANT
ELECT NEEDLE TIP 2.8 STRL (NEEDLE) ×2 IMPLANT
ELECT REM PT RETURN 9FT ADLT (ELECTROSURGICAL) ×2
ELECTRODE REM PT RTRN 9FT ADLT (ELECTROSURGICAL) ×1 IMPLANT
EVACUATOR 1/8 PVC DRAIN (DRAIN) IMPLANT
GAUZE XEROFORM 5X9 LF (GAUZE/BANDAGES/DRESSINGS) ×2 IMPLANT
GLOVE BIOGEL PI IND STRL 8 (GLOVE) ×2 IMPLANT
GLOVE BIOGEL PI INDICATOR 8 (GLOVE) ×2
GLOVE ECLIPSE 7.5 STRL STRAW (GLOVE) ×4 IMPLANT
GOWN SRG XL XLNG 56XLVL 4 (GOWN DISPOSABLE) ×2 IMPLANT
GOWN STRL NON-REIN LRG LVL3 (GOWN DISPOSABLE) ×2 IMPLANT
GOWN STRL NON-REIN XL XLG LVL4 (GOWN DISPOSABLE) ×2
HANDPIECE INTERPULSE COAX TIP (DISPOSABLE)
HOOD PEEL AWAY FACE SHEILD DIS (HOOD) ×4 IMPLANT
KIT BASIN OR (CUSTOM PROCEDURE TRAY) ×2 IMPLANT
KIT ROOM TURNOVER OR (KITS) ×2 IMPLANT
MANIFOLD NEPTUNE II (INSTRUMENTS) ×2 IMPLANT
NDL SUT 2 .5 CRC MAYO 1.732X (NEEDLE) ×1 IMPLANT
NEEDLE 22X1 1/2 (OR ONLY) (NEEDLE) IMPLANT
NEEDLE MAYO TAPER (NEEDLE) ×2
NOZZLE PRISM 8.5MM (MISCELLANEOUS) IMPLANT
NS IRRIG 1000ML POUR BTL (IV SOLUTION) ×2 IMPLANT
PACK SHOULDER (CUSTOM PROCEDURE TRAY) ×2 IMPLANT
PAD ARMBOARD 7.5X6 YLW CONV (MISCELLANEOUS) ×4 IMPLANT
PASSER SUT SWANSON 36MM LOOP (INSTRUMENTS) IMPLANT
PRESSURIZER FEMORAL UNIV (MISCELLANEOUS) IMPLANT
SET HNDPC FAN SPRY TIP SCT (DISPOSABLE) IMPLANT
SLING ARM IMMOBILIZER LRG (SOFTGOODS) IMPLANT
SLING ARM IMMOBILIZER MED (SOFTGOODS) IMPLANT
SMARTMIX MINI TOWER (MISCELLANEOUS) ×2
SPONGE LAP 18X18 X RAY DECT (DISPOSABLE) ×4 IMPLANT
SPONGE LAP 4X18 X RAY DECT (DISPOSABLE) ×4 IMPLANT
STAPLER VISISTAT 35W (STAPLE) ×2 IMPLANT
SUCTION FRAZIER TIP 10 FR DISP (SUCTIONS) ×2 IMPLANT
SUT ETHIBOND 2 V 37 (SUTURE) ×4 IMPLANT
SUT FIBERWIRE #2 38 T-5 BLUE (SUTURE) ×10
SUT SILK 2 0 (SUTURE)
SUT SILK 2-0 18XBRD TIE 12 (SUTURE) IMPLANT
SUT VIC AB 0 CT1 27 (SUTURE) ×4
SUT VIC AB 0 CT1 27XBRD ANBCTR (SUTURE) ×2 IMPLANT
SUT VIC AB 2-0 CT1 27 (SUTURE) ×4
SUT VIC AB 2-0 CT1 TAPERPNT 27 (SUTURE) ×1 IMPLANT
SUT VICRYL 0 TIES 12 18 (SUTURE) ×2 IMPLANT
SUTURE FIBERWR #2 38 T-5 BLUE (SUTURE) ×5 IMPLANT
SYR CONTROL 10ML LL (SYRINGE) IMPLANT
TOWEL OR 17X24 6PK STRL BLUE (TOWEL DISPOSABLE) ×2 IMPLANT
TOWEL OR 17X26 10 PK STRL BLUE (TOWEL DISPOSABLE) ×2 IMPLANT
TOWER CARTRIDGE SMART MIX (DISPOSABLE) IMPLANT
TOWER SMARTMIX MINI (MISCELLANEOUS) IMPLANT
TRAY FOLEY CATH 14FR (SET/KITS/TRAYS/PACK) IMPLANT
WATER STERILE IRR 1000ML POUR (IV SOLUTION) ×2 IMPLANT
YANKAUER SUCT BULB TIP NO VENT (SUCTIONS) ×2 IMPLANT

## 2012-03-14 NOTE — Discharge Instructions (Signed)
Wear sling at all times except when working with PT Avoid external rotation of your arm.

## 2012-03-14 NOTE — Plan of Care (Signed)
Problem: Consults Goal: Rotator Cuff Repair Patient Education See Patient Education Module for education specifics. Outcome: Completed/Met Date Met:  03/14/12 Pt had a Left Total Shoulder Arthroplasty

## 2012-03-14 NOTE — Anesthesia Preprocedure Evaluation (Signed)
Anesthesia Evaluation  Patient identified by MRN, date of birth, ID band Patient awake    Reviewed: Allergy & Precautions, H&P , NPO status , Patient's Chart, lab work & pertinent test results  History of Anesthesia Complications (+) PONV  Airway Mallampati: II TM Distance: >3 FB Neck ROM: Full    Dental  (+) Teeth Intact and Caps   Pulmonary    Pulmonary exam normal       Cardiovascular hypertension, Pt. on medications Rhythm:Regular     Neuro/Psych    GI/Hepatic GERD-  Medicated and Controlled,  Endo/Other    Renal/GU      Musculoskeletal   Abdominal Normal abdominal exam  (+)   Peds  Hematology   Anesthesia Other Findings   Reproductive/Obstetrics                           Anesthesia Physical Anesthesia Plan  ASA: II  Anesthesia Plan: General and Regional   Post-op Pain Management: MAC Combined w/ Regional for Post-op pain   Induction: Intravenous  Airway Management Planned: Oral ETT  Additional Equipment:   Intra-op Plan:   Post-operative Plan: Extubation in OR  Informed Consent: I have reviewed the patients History and Physical, chart, labs and discussed the procedure including the risks, benefits and alternatives for the proposed anesthesia with the patient or authorized representative who has indicated his/her understanding and acceptance.   Dental advisory given  Plan Discussed with: CRNA, Anesthesiologist and Surgeon  Anesthesia Plan Comments:         Anesthesia Quick Evaluation

## 2012-03-14 NOTE — Transfer of Care (Signed)
Immediate Anesthesia Transfer of Care Note  Patient: [name]  Procedure(s) Performed: Procedure(s) (LRB): TOTAL SHOULDER ARTHROPLASTY (Left)  Patient Location: PACU  Anesthesia Type: GA combined with regional for post-op pain  Level of Consciousness: awake, alert  and oriented  Airway & Oxygen Therapy: Patient Spontanous Breathing and Patient connected to face mask oxygen  Post-op Assessment: Report given to PACU RN  Post vital signs: Reviewed and stable  Complications: No apparent anesthesia complications

## 2012-03-14 NOTE — H&P (Signed)
PREOPERATIVE H&P  Chief Complaint: L. Shoulder pain  HPI: [name] is a 66 y.o. female who presents for evaluation of L. Shoulder pain. It has been present for greater than 1 year and has been worsening.  She has bone on bone changes on plain films. She has failed conservative measures. Pain is rated as severe.  Past Medical History  Diagnosis Date  . Hypertension   . Peripheral vascular disease     has spider veins  . GERD (gastroesophageal reflux disease)     at times  . Arthritis    Past Surgical History  Procedure Date  . 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 Mastectomy 2008    right breast  . Joint replacement 2009    right knee  . Joint replacement 2011    left knee  . Shoulder arthroscopy distal clavicle excision and open rotator cuff repair 2011  . Abdominal hysterectomy 1982  . Breast surgery    History   Social History  . Marital Status: Divorced    Spouse Name: N/A    Number of Children: N/A  . Years of Education: N/A   Social History Main Topics  . Smoking status: Never Smoker   . Smokeless tobacco: Never Used  . Alcohol Use: No  . Drug Use: No  . Sexually Active: Not Currently   Other Topics Concern  . None   Social History Narrative  . None   Family History  Problem Relation Age of Onset  . Anesthesia problems Neg Hx   . Hypotension Neg Hx   . Malignant hyperthermia Neg Hx   . Pseudochol deficiency Neg Hx    Allergies  Allergen Reactions  . Dilaudid (Hydromorphone Hcl) Itching  . Morphine And Related Nausea And Vomiting   Prior to Admission medications   Medication Sig Start Date End Date Taking? Authorizing Provider  atorvastatin (LIPITOR) 20 MG tablet Take 20 mg by mouth daily.   Yes Historical Provider, MD  bisacodyl (DULCOLAX) 5 MG EC tablet Take 5-10 mg by mouth daily. For constipation   Yes Historical Provider, MD  Calcium Carbonate-Vitamin D (CALCIUM-VITAMIN D) 600-200 MG-UNIT CAPS Take 1 capsule by mouth daily.   Yes  Historical Provider, MD  DiphenhydrAMINE HCl, Sleep, (UNISOM SLEEPGELS) 50 MG CAPS Take 2 capsules by mouth at bedtime.   Yes Historical Provider, MD  HYDROcodone-acetaminophen (VICODIN) 5-500 MG per tablet Take 1 tablet by mouth every 8 (eight) hours as needed. For pain   Yes Historical Provider, MD  ibuprofen (ADVIL,MOTRIN) 200 MG tablet Take 400 mg by mouth every 6 (six) hours as needed. For pain   Yes Historical Provider, MD  Multiple Vitamin (MULITIVITAMIN WITH MINERALS) TABS Take 1 tablet by mouth daily.   Yes Historical Provider, MD  olmesartan-hydrochlorothiazide (BENICAR HCT) 20-12.5 MG per tablet Take 1 tablet by mouth daily.   Yes Historical Provider, MD     Positive ROS: none  All other systems have been reviewed and were otherwise negative with the exception of those mentioned in the HPI and as above.  Physical Exam: Filed Vitals:   03/14/12 0601  BP: 129/83  Pulse: 77  Temp: 97.9 F (36.6 C)  Resp: 18    General: Alert, no acute distress Cardiovascular: No pedal edema Respiratory: No cyanosis, no use of accessory musculature GI: No organomegaly, abdomen is soft and non-tender Skin: No lesions in the area of chief complaint Neurologic: Sensation intact distally Psychiatric: Patient is competent for consent with  normal mood and affect Lymphatic: No axillary or cervical lymphadenopathy  MUSCULOSKELETAL: L. Shoulder pain on ROM + grind   Assessment/Plan: DJD LEFT SIDE Plan for Procedure(s): TOTAL SHOULDER ARTHROPLASTY  The risks benefits and alternatives were discussed with the patient including but not limited to the risks of nonoperative treatment, versus surgical intervention including infection, bleeding, nerve injury, malunion, nonunion, hardware prominence, hardware failure, need for hardware removal, blood clots, cardiopulmonary complications, morbidity, mortality, among others, and they were willing to proceed.  Predicted outcome is good, although there will  be at least a six to nine month expected recovery.  Akira Perusse L, MD 03/14/2012 7:27 AM

## 2012-03-14 NOTE — Preoperative (Signed)
Beta Blockers   Reason not to administer Beta Blockers:Not Applicable 

## 2012-03-14 NOTE — Anesthesia Postprocedure Evaluation (Signed)
Anesthesia Post Note  Patient: [name]  Procedure(s) Performed: Procedure(s) (LRB): TOTAL SHOULDER ARTHROPLASTY (Left)  Anesthesia type: general  Patient location: PACU  Post pain: Pain level controlled  Post assessment: Patient's Cardiovascular Status Stable  Last Vitals:  Filed Vitals:   03/14/12 1315  BP:   Pulse: 88  Temp: 37.1 C  Resp: 16    Post vital signs: Reviewed and stable  Level of consciousness: sedated  Complications: No apparent anesthesia complications

## 2012-03-14 NOTE — Anesthesia Procedure Notes (Signed)
Anesthesia Regional Block:  Interscalene brachial plexus block  Pre-Anesthetic Checklist: ,, timeout performed, Correct Patient, Correct Site, Correct Laterality, Correct Procedure,, site marked, risks and benefits discussed, Surgical consent,  Pre-op evaluation,  At surgeon's request and post-op pain management  Laterality: Left  Prep: chloraprep       Needles:  Injection technique: Single-shot  Needle Type: Echogenic Stimulator Needle     Needle Length: 5cm 5 cm Needle Gauge: 22 and 22 G    Additional Needles:  Procedures: ultrasound guided and nerve stimulator Interscalene brachial plexus block  Nerve Stimulator or Paresthesia:  Response: bicep contraction, 0.45 mA,   Additional Responses:   Narrative:  Start time: 03/14/2012 7:04 AM End time: 03/14/2012 7:14 AM Injection made incrementally with aspirations every 5 mL.  Performed by: Personally  Anesthesiologist: J. Adonis Huguenin, MD  Additional Notes: Functioning IV was confirmed and monitors applied.  A 50mm 22ga echogenic arrow stimulator was used. Sterile prep and drape,hand hygiene and sterile gloves were used.Ultrasound guidance: relevant anatomy identified, needle position confirmed, local anesthetic spread visualized around nerve(s)., vascular puncture avoided.  Image printed for medical record.  Negative aspiration and negative test dose prior to incremental administration of local anesthetic. The patient tolerated the procedure well.  Interscalene brachial plexus block

## 2012-03-14 NOTE — Progress Notes (Signed)
UR COMPLETED  

## 2012-03-14 NOTE — Brief Op Note (Signed)
03/14/2012  11:23 AM  PATIENT:  [name]  66 y.o. female  PRE-OPERATIVE DIAGNOSIS:  Degenerative Joint Disease LEFT SHOULDER  POST-OPERATIVE DIAGNOSIS:  Degenerative Joint Disease LEFT SHOULDER  PROCEDURE:  Procedure(s) (LRB): TOTAL SHOULDER ARTHROPLASTY (Left)  SURGEON:  Surgeon(s) and Role:    * Harvie Junior, MD - Primary  PHYSICIAN ASSISTANT:   ASSISTANTS: bethune   ANESTHESIA:   general  EBL:  Total I/O In: 1400 [I.V.:1400] Out: 150 [Blood:150]  BLOOD ADMINISTERED:none  DRAINS: none   LOCAL MEDICATIONS USED:  NONE  SPECIMEN:  No Specimen  DISPOSITION OF SPECIMEN:  N/A  COUNTS:  YES  TOURNIQUET:  * No tourniquets in log *  DICTATION: .Other Dictation: Dictation Number [id number]  PLAN OF CARE: Admit to inpatient   PATIENT DISPOSITION:  PACU - hemodynamically stable.   Delay start of Pharmacological VTE agent (>24hrs) due to surgical blood loss or risk of bleeding: not applicable

## 2012-03-15 LAB — BASIC METABOLIC PANEL
BUN: 10 mg/dL (ref 6–23)
CO2: 27 mEq/L (ref 19–32)
Calcium: 8.6 mg/dL (ref 8.4–10.5)
Chloride: 97 mEq/L (ref 96–112)
Creatinine, Ser: 0.71 mg/dL (ref 0.50–1.10)
GFR calc Af Amer: >90 mL/min (ref >90)
GFR calc non Af Amer: 88 mL/min — ABNORMAL LOW (ref >90)
Glucose, Bld: 121 mg/dL — ABNORMAL HIGH (ref 70–99)
Potassium: 2.9 mEq/L — ABNORMAL LOW (ref 3.5–5.1)
Sodium: 137 mEq/L (ref 135–145)

## 2012-03-15 LAB — HEMOGLOBIN AND HEMATOCRIT, BLOOD
HCT: 27.2 % — ABNORMAL LOW (ref 36.0–46.0)
Hemoglobin: 9.4 g/dL — ABNORMAL LOW (ref 12.0–15.0)

## 2012-03-15 MED ORDER — POTASSIUM CHLORIDE CRYS ER 20 MEQ PO TBCR
40.0000 meq | EXTENDED_RELEASE_TABLET | Freq: Two times a day (BID) | ORAL | Status: DC
  Administered 2012-03-16: 40 meq via ORAL
  Filled 2012-03-15 (×4): qty 2

## 2012-03-15 MED ORDER — POTASSIUM CHLORIDE CRYS ER 20 MEQ PO TBCR
40.0000 meq | EXTENDED_RELEASE_TABLET | Freq: Two times a day (BID) | ORAL | Status: DC
  Administered 2012-03-15 – 2012-03-16 (×3): 40 meq via ORAL
  Filled 2012-03-15 (×4): qty 2

## 2012-03-15 NOTE — Op Note (Signed)
[name], [name]               ACCOUNT NO.:  [id number]  MEDICAL RECORD NO.:  [id number]  LOCATION:  [facility]                         FACILITY:  [facility]  PHYSICIAN:  Harvie Junior, M.D.   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  PROCEDURES: 1. Left total shoulder replacement of global with a size 10 with a     40+15 head ball and a 40-mm glenoid. 2. Open rotator cuff repair. 3. Open bicipital tenodesis.  SURGEON:  Harvie Junior, M.D.  ASSISTANT:  Marshia Ly, P.A.  ANESTHESIA:  General.  BRIEF HISTORY:  [name] is a 66 year old female with a long history of significant complaint of left shoulder pain.  We treated conservatively for prolonged period of time.  After failure of conservative care, an x-ray is showing bone-on-bone changes.  The patient was ultimately taken to the operating room for left total shoulder replacement.  We discussed the risks and benefits of surgery preoperatively and felt that this has been the most appropriate course of action.  She was brought to the operating room for this procedure.  PROCEDURE IN DETAIL:  The patient was taken to the operating room. After adequate anesthesia was obtained with general anesthetic, the patient was placed supine on the operating table and was then moved to the beach chair position.  All bony prominences were well padded.  Left shoulder was then prepped and draped in usual sterile fashion. Following this, incision was made from the coracoid down to the insertion of the deltoid, subcutaneous tissue, down to the level of the deltopectoral interval which clearly identified and retractors were put in place  underneath the clavicle pectoral fascia and underneath the deltoid.  The axillary nerve was identified and protected.  At this point, attention was turned to the biceps tendon, where the biceps tendon was bulbous and looked as though it had an impinging rupture. This was released right at the bicipital groove and attention was turned towards the pack where the upper quarter of the pack was released and then with the arm held straight.  The biceps tendon was tenodesed to the upper edge of the pectoralis muscle.  Once this was completed, attention was turned towards the subscap where a saw was used to make a pilot and an osteotome used to take a way for the subscap.  subscap was then completely dissected back to the glenoid rim and care was taken at this point to do a tying off the anterior vessels at the inferior border of the subscap.  Axillary nerve was again protected and attention was turned to the humeral side with the arm in 25 degrees of external rotation.  The freehand cut was made cutting the upper portion of the humeral head off and then the body size and then the arm was sequentially reamed up to 10 and the body size 10 was used to get an excellent fit in the  stem, really seemed it was a narrow head and we felt that 40 was going to be appropriate.  Attention at this point was turned towards removal of osteophytes which were removed, and the trial was left in place.  Attention was turned to the glenoid, a release of the labrum was completed, and the labrectomy was performed.  Tried a 44 glenoid initially but it really was too big in the posterior inferior areas.  We went with a 40 glenoid, drilled a pilot hole, and then reamed.  It is a little bit differentially reaming anteriorly as she had a little bit more posterior wear.  Once this was reamed, attention was turned towards drilling a pilot hole which was drilled, and then we made sure they were in bone with a sucker.  Once this  was done, the central hole was placed and the pilot holes were then drilled and pegs were placed in about posterior hole was drilled.  At this point, cement was mixed on the back table, and a 44 anchor PEG glenoid was cemented into place.  After carefully drying the glenoid, we used the significant amount of bone graft in the anchor PEG which was harvested from the reamings.  Once the cement was allowed to harden, attention was turned back to the stem size where the trial 40+15 ball was used, might could use a little eccentric, but with a 40, I do not get any eccentric option, so we took it at its place and put through a range of motion easy full overhead elevation with the hand on the top of the head, external rotation, and internal rotation were fine.  There was about 50 degree-translation anteriorly and posteriorly at this point.  At this point, we turned towards removing the trial components and then 4 drill holes were drilled posterior to the bicipital groove.  Four drill holes were drilled just anterior to the subscap osteotomy and then sutures were passed leaving access to the stem to go around.  Once all 4 sutures were passed, the stem was then passed down the place with the sutures going around the body of the stem and the stem was hammered into place. Once this was done, the subscap osteotomy was held in place and with 2 Mason-Allen and two straight stitches, the subscap osteotomy was replaced to its perfect in neutral alignment on the anterior aspect of the humerus.  At this point, rotator cuff repair was performed with FiberWire interrupted suture, and at this point, the wound was copiously and thoroughly irrigated and suctioned dry.  We checked the tension on the osteotomy was perfect, full range of motion without any movement of the subscap with full overhead elevation, external rotation was to 40 degrees without tremendous attention, so at this point, I felt very good about  what was going on.  Checked the axillary nerve, was intact and in good position.  At this point, the wound was irrigated again and suctioned dry.  During the dissection, the upper part of the shoulder, the cephalic vein was transected, and this was tied off at this point to make sure there was no issues with that.  At this point, the wound was again irrigated and suctioned dry.  The interval was then allowed to fall together and the subcu was closed with 0 and 2-0 Vicryl and 3-0 Monocryl subcuticular.  Benzoin, Steri-Strips were applied.  Sterile compressive dressing was applied, and the patient was taken to recovery and was noted to be in satisfactory  condition.  Estimated blood loss for procedure was 300 mL.     Harvie Junior, M.D.     Ranae Plumber  D:  03/14/2012  T:  03/14/2012  Job:  [id number]

## 2012-03-15 NOTE — Evaluation (Signed)
Occupational Therapy Evaluation Patient Details Name: [name] MRN: [id number] DOB: 03-Jan-1946 Today's Date: 03/15/2012 Time: 7829-5621 OT Time Calculation (min): 36 min  OT Assessment / Plan / Recommendation Clinical Impression  66 yo female s/p Lt TSA that could benefit from skilled OT. Recommend outpatient follow up for progression of exercises    OT Assessment  Patient needs continued OT Services    Follow Up Recommendations  Outpatient OT    Barriers to Discharge      Equipment Recommendations  None recommended by OT    Recommendations for Other Services    Frequency  Min 3X/week    Precautions / Restrictions Precautions Precautions: Shoulder Precaution Comments: shoulder handout provided Restrictions LUE Weight Bearing: Non weight bearing   Pertinent Vitals/Pain 7 out 10 with exercises only    ADL  Grooming: Performed;Wash/dry hands;Teeth care;Set up (using Rt UE on sink for support) Where Assessed - Grooming: Unsupported standing Toilet Transfer: Performed;Set up Toilet Transfer Method: Sit to stand Toilet Transfer Equipment: Regular height toilet Toileting - Clothing Manipulation and Hygiene: Performed;Set up Where Assessed - Toileting Clothing Manipulation and Hygiene: Sit to stand from 3-in-1 or toilet Equipment Used: Other (comment) (sling) Transfers/Ambulation Related to ADLs: ambulated ~ 15 ft min guard (A) ADL Comments: PT provided shoulder handout and educated on ADL, NWB Lt shoulder and PROM exercises. Pt return demo don / doff shoulder sling. Pt able to verbalize to therapist no external rotation from MD education session. Pt completed bed mobility, sink level grooming and toilet transfer this session    OT Diagnosis:    OT Problem List: Decreased strength;Decreased activity tolerance;Decreased range of motion;Pain OT Treatment Interventions: Patient/family education;Balance training;Therapeutic activities;Therapeutic exercise;Self-care/ADL  training   OT Goals Acute Rehab OT Goals OT Goal Formulation: With patient Time For Goal Achievement: 03/22/12 Potential to Achieve Goals: Good Miscellaneous OT Goals Miscellaneous OT Goal #1: Pt will demonstrate HEP Mod I (shoulder flexion 90 degrees PROM) OT Goal: Miscellaneous Goal #1 - Progress: Goal set today Miscellaneous OT Goal #2: Pt will don / doff sling MOD I as precursor to basic transfers  Visit Information  Last OT Received On: 03/15/12 Assistance Needed: +1    Subjective Data  Subjective: "I still need to have my back operated on " Patient Stated Goal: to return home and enjoy grandchildren   Prior Functioning  Home Living Type of Home: Apartment Prior Function Driving: Yes Vocation: Retired Musician: No difficulties Dominant Hand: Right    Cognition  Overall Cognitive Status: Appears within functional limits for tasks assessed/performed Arousal/Alertness: Awake/alert Orientation Level: Appears intact for tasks assessed Behavior During Session: [facility] for tasks performed    Extremity/Trunk Assessment Right Upper Extremity Assessment RUE ROM/Strength/Tone: Within functional levels Left Upper Extremity Assessment LUE ROM/Strength/Tone: Deficits;Due to precautions LUE Sensation: Deficits (nerve block at surg) Trunk Assessment Trunk Assessment: Normal   Mobility Bed Mobility Bed Mobility: Supine to Sit Supine to Sit: HOB elevated;5: Supervision Transfers Transfers: Sit to Stand;Stand to Sit Sit to Stand: 4: Min guard;From bed;With upper extremity assist Stand to Sit: 4: Min guard;To chair/3-in-1;With upper extremity assist   Exercise    Balance    End of Session OT - End of Session Activity Tolerance: Patient limited by pain Patient left: in chair;with call bell/phone within reach Nurse Communication: Precautions   Harrel [facility] 03/15/2012, 9:56 AM Pager: [phone]

## 2012-03-15 NOTE — Progress Notes (Signed)
Subjective: 1 Day Post-Op Procedure(s) (LRB): TOTAL SHOULDER ARTHROPLASTY (Left)  Activity level:  oob Diet tolerance:  ok Voiding:  ok Patient reports pain as 3 on 0-10 scale.    Objective: Vital signs in last 24 hours: Temp:  [96.8 F (36 C)-98.8 F (37.1 C)] 98.5 F (36.9 C) (06/01 0428) Pulse Rate:  [70-95] 82  (06/01 0428) Resp:  [12-29] 16  (06/01 0437) BP: (111-129)/(55-74) 129/68 mmHg (06/01 0428) SpO2:  [96 %-100 %] 99 % (06/01 0437) Weight:  [80.65 kg (177 lb 12.8 oz)] 80.65 kg (177 lb 12.8 oz) (05/31 1358)  Labs:  Basename 03/15/12 0530  HGB 9.4*    Basename 03/15/12 0530  WBC --  RBC --  HCT 27.2*  PLT --    Basename 03/15/12 0530  NA 137  K 2.9*  CL 97  CO2 27  BUN 10  CREATININE 0.71  GLUCOSE 121*  CALCIUM 8.6   No results found for this basename: LABPT:2,INR:2 in the last 72 hours  Physical Exam:  Neurologically intact ABD soft Neurovascular intact Sensation intact distally Intact pulses distally No cellulitis present Dressing dry Block working  Assessment/Plan:  1 Day Post-Op Procedure(s) (LRB): TOTAL SHOULDER ARTHROPLASTY (Left) Advance diet Up with therapy Give po kcl  Will leave pca for now    Rivaldo Hineman R 03/15/2012, 8:33 AM

## 2012-03-16 LAB — BASIC METABOLIC PANEL
BUN: 14 mg/dL (ref 6–23)
CO2: 27 mEq/L (ref 19–32)
Calcium: 8.8 mg/dL (ref 8.4–10.5)
Chloride: 100 mEq/L (ref 96–112)
Creatinine, Ser: 0.71 mg/dL (ref 0.50–1.10)
GFR calc Af Amer: >90 mL/min (ref >90)
GFR calc non Af Amer: 88 mL/min — ABNORMAL LOW (ref >90)
Glucose, Bld: 111 mg/dL — ABNORMAL HIGH (ref 70–99)
Potassium: 3.2 mEq/L — ABNORMAL LOW (ref 3.5–5.1)
Sodium: 136 mEq/L (ref 135–145)

## 2012-03-16 MED ORDER — WHITE PETROLATUM GEL
Status: AC
  Administered 2012-03-16: 12:00:00
  Filled 2012-03-16: qty 5

## 2012-03-16 MED ORDER — OXYCODONE-ACETAMINOPHEN 5-325 MG PO TABS: 1.0000 | ORAL_TABLET | ORAL | Status: AC | PRN

## 2012-03-16 NOTE — Progress Notes (Signed)
Subjective: 2 Days Post-Op Procedure(s) (LRB): TOTAL SHOULDER ARTHROPLASTY (Left)  Activity level:  sling Diet tolerance:  ok Voiding:  ok Patient reports pain as 1 on 0-10 scale.    Objective: Vital signs in last 24 hours: Temp:  [98.2 F (36.8 C)-98.7 F (37.1 C)] 98.2 F (36.8 C) (06/02 0656) Pulse Rate:  [70-84] 84  (06/02 0656) Resp:  [16-18] 16  (06/02 0656) BP: (117-121)/(58-78) 117/78 mmHg (06/02 0656) SpO2:  [93 %-97 %] 95 % (06/02 0656)  Labs:  Basename 03/15/12 0530  HGB 9.4*    Basename 03/15/12 0530  WBC --  RBC --  HCT 27.2*  PLT --    Basename 03/15/12 2338 03/15/12 0530  NA 136 137  K 3.2* 2.9*  CL 100 97  CO2 27 27  BUN 14 10  CREATININE 0.71 0.71  GLUCOSE 111* 121*  CALCIUM 8.8 8.6   No results found for this basename: LABPT:2,INR:2 in the last 72 hours  Physical Exam:  Neurologically intact ABD soft Neurovascular intact Sensation intact distally Intact pulses distally No cellulitis present Dressing change  Assessment/Plan:  2 Days Post-Op Procedure(s) (LRB): TOTAL SHOULDER ARTHROPLASTY (Left) Advance diet Up with therapy D/C IV fluids D/c home    Mata Rowen R 03/16/2012, 9:49 AM

## 2012-03-16 NOTE — Progress Notes (Signed)
   CARE MANAGEMENT NOTE 03/16/2012  Patient:  [name], [name]   Account Number:  [id number]  Date Initiated:  03/16/2012  Documentation initiated by:  [facility]  Subjective/Objective Assessment:   TOTAL SHOULDER ARTHROPLASTY     Action/Plan:   Anticipated DC Date:  03/16/2012   Anticipated DC Plan:  HOME W HOME HEALTH SERVICES      DC Planning Services  CM consult      [facility] Choice  HOME HEALTH   Choice offered to / List presented to:  C-1 Patient           HH agency  [facility]   Status of service:  Completed, signed off Medicare Important Message given?   (If response is "NO", the following Medicare IM given date fields will be blank) Date Medicare IM given:   Date Additional Medicare IM given:    Discharge Disposition:  HOME W HOME HEALTH SERVICES  Per UR Regulation:    If discussed at Long Length of Stay Meetings, dates discussed:    Comments:  66/11/2011 1400 Pt was set up from MD's office for [facility] at d/c. [facility] aware of d/c home today. Isidoro Donning RN CCM Case Mgmt phone [phone]

## 2012-03-16 NOTE — Progress Notes (Addendum)
Occupational Therapy Treatment Patient Details Name: [name] MRN: [id number] DOB: 1945/12/22 Today's Date: 03/16/2012 Time: 7829-5621 OT Time Calculation (min): 13 min  OT Assessment / Plan / Recommendation Comments on Treatment Session Pt able to recall and demonstrate shoulder education provided during yesterday's session.  Pt demonstrated mod I with HEP (PROM FF).  Pt planning to d/c home today.    Follow Up Recommendations  Outpatient OT    Barriers to Discharge       Equipment Recommendations  None recommended by OT    Recommendations for Other Services    Frequency Min 3X/week   Plan Discharge plan remains appropriate;All goals met and education completed, patient discharged from OT services    Precautions / Restrictions Precautions Precautions: Shoulder Precaution Comments: shoulder handout provided Restrictions Weight Bearing Restrictions: Yes LUE Weight Bearing: Non weight bearing   Pertinent Vitals/Pain See vitals    ADL  Upper Body Dressing: Performed;Modified independent Where Assessed - Upper Body Dressing: Unsupported sit to stand Equipment Used:  (sling) ADL Comments: Reviewed shoulder handout with pt as well as ADL techniques and adhering to shoulder precautions.  Pt able to don/doff sling with mod I for increased time.  Pt able to perform activities without external rotation.      OT Diagnosis:    OT Problem List:   OT Treatment Interventions:     OT Goals Miscellaneous OT Goals Miscellaneous OT Goal #1: Pt will demonstrate HEP Mod I (shoulder flexion 90 degrees PROM) OT Goal: Miscellaneous Goal #1 - Progress: Met Miscellaneous OT Goal #2: Pt will don / doff sling MOD I as precursor to basic transfers OT Goal: Miscellaneous Goal #2 - Progress: Met  Visit Information  Last OT Received On: 03/16/12 Assistance Needed: +1    Subjective Data      Prior Functioning       Cognition  Overall Cognitive Status: Appears within functional limits  for tasks assessed/performed Arousal/Alertness: Awake/alert Orientation Level: Appears intact for tasks assessed Behavior During Session: [facility] for tasks performed    Mobility Bed Mobility Bed Mobility: Supine to Sit Supine to Sit: HOB elevated;6: Modified independent (Device/Increase time)   Exercises    Balance    End of Session OT - End of Session Equipment Utilized During Treatment: Other (comment) (sling) Activity Tolerance:  (nausea) Patient left: in bed;with call bell/phone within reach (sitting EOB) Nurse Communication: Mobility status (pt sitting EOB with c/o nausea)  03/16/2012 Cipriano Mile OTR/L Pager [phone] Office [phone]  Cipriano Mile 03/16/2012, 11:01 AM

## 2012-03-17 ENCOUNTER — Encounter (HOSPITAL_COMMUNITY): Payer: Self-pay | Admitting: Orthopedic Surgery

## 2012-03-24 DIAGNOSIS — M19012 Primary osteoarthritis, left shoulder: Secondary | ICD-10-CM | POA: Diagnosis present

## 2012-03-24 NOTE — Discharge Summary (Signed)
Patient ID: BRITLEE SKOLNIK MRN: [id number] DOB/AGE: 67-Jan-1947 67 y.o.  Admit date: 03/14/2012 Discharge date: 03/16/2012 Admission Diagnoses:  Principal Problem:  *Osteoarthritis of left shoulder   Discharge Diagnoses:  Same  Past Medical History  Diagnosis Date  . Hypertension   . Peripheral vascular disease     has spider veins  . GERD (gastroesophageal reflux disease)     at times  . Arthritis     Surgeries: Procedure(s):Left TOTAL SHOULDER ARTHROPLASTY on 03/14/2012   Discharged Condition: Improved  Hospital Course: NISHITA ISAACKS is an 66 y.o. female who was admitted 03/14/2012 for operative treatment ofOsteoarthritis of left shoulder. Patient has severe unremitting pain that affects sleep, daily activities, and work/hobbies. After pre-op clearance the patient was taken to the operating room on 03/14/2012 and underwent  Procedure(s):Left TOTAL SHOULDER ARTHROPLASTY.    Patient was given perioperative antibiotics:  Anti-infectives     Start     Dose/Rate Route Frequency Ordered Stop   03/14/12 1400   ceFAZolin (ANCEF) IVPB 2 g/50 mL premix        2 g 100 mL/hr over 30 Minutes Intravenous Every 6 hours 03/14/12 1351 03/15/12 0537   03/13/12 1326   ceFAZolin (ANCEF) IVPB 2 g/50 mL premix        2 g 100 mL/hr over 30 Minutes Intravenous 60 min pre-op 03/13/12 1326 03/14/12 0756           Patient was given sequential compression devices, early ambulation, and SCD's to prevent DVT.  Patient benefited maximally from hospital stay and there were no complications.    Recent vital signs: see vitals in chart.   Recent laboratory studies: see chart for this documentation.  Discharge Medications:   Medication List  As of 03/24/2012  3:58 PM   STOP taking these medications         HYDROcodone-acetaminophen 5-500 MG per tablet      ibuprofen 200 MG tablet         TAKE these medications         atorvastatin 20 MG tablet   Commonly known as: LIPITOR   Take 20 mg  by mouth daily.      bisacodyl 5 MG EC tablet   Commonly known as: DULCOLAX   Take 5-10 mg by mouth daily. For constipation      Calcium-Vitamin D 600-200 MG-UNIT Caps   Take 1 capsule by mouth daily.      multivitamin with minerals Tabs   Take 1 tablet by mouth daily.      olmesartan-hydrochlorothiazide 20-12.5 MG per tablet   Commonly known as: BENICAR HCT   Take 1 tablet by mouth daily.      oxyCODONE-acetaminophen 5-325 MG per tablet   Commonly known as: PERCOCET   Take 1-2 tablets by mouth every 4 (four) hours as needed.      UNISOM SLEEPGELS 50 MG Caps   Generic drug: DiphenhydrAMINE HCl (Sleep)   Take 2 capsules by mouth at bedtime.            Diagnostic Studies: Dg Chest 2 View  02/29/2012  *RADIOLOGY REPORT*  Clinical Data: Preoperative radiograph.  Left shoulder replacement. History of pneumothorax 2004.  CHEST - 2 VIEW  Comparison: 12/07/2010.  Findings: Chronic deformity of right chest wall.  Surgical clips present in the right breast.  Right breast implant is present.  No airspace disease.  No effusion.  Compared to prior, no interval change.  Resection of the distal right clavicle is noted.  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  No active cardiopulmonary disease.  Original Report Authenticated By: Andreas Newport, M.D.   Dg Shoulder Left  03/14/2012  *RADIOLOGY REPORT*  Clinical Data: Postop left shoulder replacement.  LEFT SHOULDER - 2+ VIEW  Comparison: 12/21/2009  Findings: Changes of left shoulder replacement and resection of the distal left clavicle.  Normal alignment.  No complicating feature.  IMPRESSION: Postoperative changes as above.  No complicating feature.  Original Report Authenticated By: Cyndie Chime, M.D.   Mm Digital Screening Unilat L  03/13/2012  *RADIOLOGY REPORT*  Clinical Data: Screening.  MAMMOGRAPHIC UNILATERAL LEFT DIGITAL SCREENING WITH CAD  Findings: The breast tissue is heterogeneously dense.  No masses or malignant type  calcifications are identified.  Compared with prior studies.  Images were processed with CAD.  IMPRESSION: No specific mammographic evidence of malignancy.  A result letter of this screening mammogram will be mailed directly to the patient.  RECOMMENDATION: Screening mammogram in one year. (Code:SM-B-01Y)  BI-RADS CATEGORY 1:  Negative  Original Report Authenticated By: Sherian Rein, M.D.    Disposition: 01-Home or Self Care  Discharge Orders    Future Orders Please Complete By Expires   Diet - low sodium heart healthy      Apply dressing      Scheduling Instructions:   4x4's and tape   Call MD / Call 911      Comments:   If you experience chest pain or shortness of breath, CALL 911 and be transported to the hospital emergency room.  If you develope a fever above 101 F, pus (white drainage) or increased drainage or redness at the wound, or calf pain, call your surgeon's office.   Constipation Prevention      Comments:   Drink plenty of fluids.  Prune juice may be helpful.  You may use a stool softener, such as Colace (over the counter) 100 mg twice a day.  Use MiraLax (over the counter) for constipation as needed.   Increase activity slowly as tolerated       Pt is set up for home health PT/OT.  Follow-up Information    Follow up with GRAVES,JOHN L, MD. Schedule an appointment as soon as possible for a visit in 10 days.   Contact information:   [address] [place] [address] [phone]           Signed: Matthew Folks 03/24/2012, 3:58 PM

## 2012-07-08 ENCOUNTER — Ambulatory Visit (INDEPENDENT_AMBULATORY_CARE_PROVIDER_SITE_OTHER): Payer: Medicare Other | Admitting: Emergency Medicine

## 2012-07-08 VITALS — BP 154/76 | HR 111 | Temp 98.2°F | Resp 18 | Ht 63.25 in | Wt 168.6 lb

## 2012-07-08 DIAGNOSIS — J018 Other acute sinusitis: Secondary | ICD-10-CM

## 2012-07-08 DIAGNOSIS — J40 Bronchitis, not specified as acute or chronic: Secondary | ICD-10-CM

## 2012-07-08 MED ORDER — PSEUDOEPHEDRINE-GUAIFENESIN ER 60-600 MG PO TB12: 1.0000 | ORAL_TABLET | Freq: Two times a day (BID) | ORAL | Status: AC

## 2012-07-08 MED ORDER — HYDROCOD POLST-CHLORPHEN POLST 10-8 MG/5ML PO LQCR: 5.0000 mL | Freq: Two times a day (BID) | ORAL | Status: DC | PRN

## 2012-07-08 MED ORDER — AMOXICILLIN-POT CLAVULANATE 875-125 MG PO TABS: 1.0000 | ORAL_TABLET | Freq: Two times a day (BID) | ORAL | Status: DC

## 2012-07-08 NOTE — Progress Notes (Signed)
Date:  07/08/2012   Name:  KYERRA MABIE   DOB:  Jul 23, 1946   MRN:  [id number] Gender: female Age: 66 y.o.  PCP:  Alice Reichert, MD    Chief Complaint: Cough   History of Present Illness:  [name] is a 66 y.o. pleasant patient who presents with the following:  Has cough and wheezing for past two weeks.  Says that it was quite severe but improved and now it has returned.  Cough is productive of a purulent sputum.  No fever or chills.  No shortness of breath.  Says that she has felt chilled.  No inhaler to use.  No sore throat but has moderate nasal congestion and post nasal drainage sometimes purulent.  Cough is worse when she reclines.  Some improvement with OTC medications.  Patient Active Problem List  Diagnosis  . Osteoarthritis of left shoulder    Past Medical History  Diagnosis Date  . Hypertension   . Peripheral vascular disease     has spider veins  . GERD (gastroesophageal reflux disease)     at times  . Arthritis     Past Surgical History  Procedure Date  . 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 Mastectomy 2008    right breast  . Joint replacement 2009    right knee  . Joint replacement 2011    left knee  . Shoulder arthroscopy distal clavicle excision and open rotator cuff repair 2011  . Abdominal hysterectomy 1982  . Breast surgery   . 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 Smoking status: Never Smoker   . Smokeless tobacco: Never Used  . Alcohol Use: No    Family History  Problem Relation Age of Onset  . Anesthesia problems Neg Hx   . Hypotension Neg Hx   . Malignant hyperthermia Neg Hx   . Pseudochol deficiency Neg Hx     Allergies  Allergen Reactions  . Dilaudid (Hydromorphone Hcl) Itching  . Morphine And Related Nausea And Vomiting    Medication list  has been reviewed and updated.  Outpatient Prescriptions Prior to Visit  Medication Sig Dispense Refill  . atorvastatin (LIPITOR) 20 MG tablet Take 20 mg by mouth daily.      . bisacodyl (DULCOLAX) 5 MG EC tablet Take 5-10 mg by mouth daily. For constipation      . Calcium Carbonate-Vitamin D (CALCIUM-VITAMIN D) 600-200 MG-UNIT CAPS Take 1 capsule by mouth daily.      . DiphenhydrAMINE HCl, Sleep, (UNISOM SLEEPGELS) 50 MG CAPS Take 2 capsules by mouth at bedtime.      . Multiple Vitamin (MULITIVITAMIN WITH MINERALS) TABS Take 1 tablet by mouth daily.      Marland Kitchen olmesartan-hydrochlorothiazide (BENICAR HCT) 20-12.5 MG per tablet Take 1 tablet by mouth daily.        Review of Systems:  As per HPI, otherwise negative.    Physical Examination: Filed Vitals:   07/08/12 1505  BP: 154/76  Pulse: 111  Temp: 98.2 F (36.8 C)  Resp: 18   Filed Vitals:   07/08/12 1505  Height: 5' 3.25" (1.607 m)  Weight: 168 lb 9.6 oz (76.476 kg)   Body mass index is 29.63 kg/(m^2). Ideal Body Weight: Weight in (lb) to have BMI = 25: 142   GEN:  WDWN, NAD, Non-toxic, A & O x 3 HEENT: Atraumatic, Normocephalic. Neck supple. No masses, No LAD.  TM negative Ears and Nose: No external deformity.  Pallid nasal mucosa.  Scant drainage. CV: RRR, No M/G/R. No JVD. No thrill. No extra heart sounds. PULM: CTA B, no wheezes, crackles, rhonchi. No retractions. No resp. distress. No accessory muscle use. ABD: S, NT, ND, +BS. No rebound. No HSM. EXTR: No c/c/e NEURO Normal gait.  PSYCH: Normally interactive. Conversant. Not depressed or anxious appearing.  Calm demeanor.    Assessment and Plan: Sinusitis Bronchitis augmentin mucinex tussionex  Follow up as needed  Carmelina Dane, MD I have reviewed and agree with documentation. Robert P. Merla Riches, M.D.

## 2012-07-14 ENCOUNTER — Telehealth: Payer: Self-pay

## 2012-07-14 MED ORDER — HYDROCOD POLST-CHLORPHEN POLST 10-8 MG/5ML PO LQCR: 5.0000 mL | Freq: Two times a day (BID) | ORAL | Status: DC | PRN

## 2012-07-14 NOTE — Telephone Encounter (Signed)
The patient called to request another Rx for cough medicine, stating that she is still having a lot of coughing.  Please call the patient at [phone].

## 2012-07-14 NOTE — Telephone Encounter (Signed)
Spoke with patient.  Med refilled but needs to recheck if symptoms persist.  Patient understands and is feeling better.

## 2012-07-14 NOTE — Telephone Encounter (Signed)
tussionex / please advise pt rc'd Rx on 07/08/12 requests renewal

## 2012-07-14 NOTE — Telephone Encounter (Signed)
Rx refilled but if symptoms persist need to RTC. No further refills without a recheck

## 2012-07-15 ENCOUNTER — Telehealth: Payer: Self-pay

## 2012-07-25 ENCOUNTER — Other Ambulatory Visit: Payer: Medicare Other | Admitting: Lab

## 2012-08-01 ENCOUNTER — Ambulatory Visit: Payer: Medicare Other | Admitting: Oncology

## 2012-09-19 NOTE — Telephone Encounter (Signed)
none

## 2012-11-13 ENCOUNTER — Ambulatory Visit (HOSPITAL_COMMUNITY)
Admission: RE | Admit: 2012-11-13 | Discharge: 2012-11-13 | Disposition: A | Discharge status: Home / Self Care | Payer: Medicare Other | Source: Ambulatory Visit | Attending: Family Medicine | Admitting: Family Medicine

## 2012-11-13 ENCOUNTER — Other Ambulatory Visit (HOSPITAL_COMMUNITY): Payer: Self-pay | Admitting: Family Medicine

## 2012-11-13 DIAGNOSIS — I10 Essential (primary) hypertension: Secondary | ICD-10-CM | POA: Insufficient documentation

## 2012-11-13 DIAGNOSIS — R053 Chronic cough: Secondary | ICD-10-CM

## 2012-11-13 DIAGNOSIS — R05 Cough: Secondary | ICD-10-CM

## 2012-11-13 DIAGNOSIS — R059 Cough, unspecified: Secondary | ICD-10-CM | POA: Insufficient documentation

## 2013-03-18 ENCOUNTER — Other Ambulatory Visit: Payer: Self-pay

## 2013-03-18 DIAGNOSIS — Z1231 Encounter for screening mammogram for malignant neoplasm of breast: Secondary | ICD-10-CM

## 2013-03-18 DIAGNOSIS — Z9011 Acquired absence of right breast and nipple: Secondary | ICD-10-CM

## 2013-04-07 ENCOUNTER — Ambulatory Visit (HOSPITAL_COMMUNITY)
Admission: RE | Admit: 2013-04-07 | Discharge: 2013-04-07 | Disposition: A | Discharge status: Home / Self Care | Payer: Medicare Other | Source: Ambulatory Visit | Attending: Family Medicine | Admitting: Family Medicine

## 2013-04-07 ENCOUNTER — Other Ambulatory Visit (HOSPITAL_COMMUNITY): Payer: Self-pay | Admitting: Family Medicine

## 2013-04-07 DIAGNOSIS — R609 Edema, unspecified: Secondary | ICD-10-CM

## 2013-04-07 DIAGNOSIS — M79609 Pain in unspecified limb: Secondary | ICD-10-CM | POA: Insufficient documentation

## 2013-04-07 DIAGNOSIS — R52 Pain, unspecified: Secondary | ICD-10-CM

## 2013-04-07 DIAGNOSIS — M7989 Other specified soft tissue disorders: Secondary | ICD-10-CM | POA: Insufficient documentation

## 2013-04-21 ENCOUNTER — Ambulatory Visit: Payer: Medicare Other

## 2013-06-10 ENCOUNTER — Ambulatory Visit
Admission: RE | Admit: 2013-06-10 | Discharge: 2013-06-10 | Disposition: A | Discharge status: Home / Self Care | Payer: Medicare Other | Source: Ambulatory Visit

## 2013-06-10 DIAGNOSIS — Z1231 Encounter for screening mammogram for malignant neoplasm of breast: Secondary | ICD-10-CM

## 2013-06-10 DIAGNOSIS — Z9011 Acquired absence of right breast and nipple: Secondary | ICD-10-CM

## 2013-06-27 ENCOUNTER — Ambulatory Visit (INDEPENDENT_AMBULATORY_CARE_PROVIDER_SITE_OTHER): Payer: Medicare Other | Admitting: Family Medicine

## 2013-06-27 VITALS — BP 122/60 | HR 87 | Temp 99.5°F | Resp 16 | Ht 65.0 in | Wt 180.0 lb

## 2013-06-27 DIAGNOSIS — J029 Acute pharyngitis, unspecified: Secondary | ICD-10-CM

## 2013-06-27 DIAGNOSIS — J309 Allergic rhinitis, unspecified: Secondary | ICD-10-CM

## 2013-06-27 LAB — POCT RAPID STREP A (OFFICE): Rapid Strep A Screen: NEGATIVE

## 2013-06-27 MED ORDER — AMOXICILLIN 500 MG PO CAPS: 1000.0000 mg | ORAL_CAPSULE | Freq: Two times a day (BID) | ORAL | Status: DC

## 2013-06-27 MED ORDER — FLUTICASONE PROPIONATE 50 MCG/ACT NA SUSP: 2.0000 | Freq: Every day | NASAL | Status: DC

## 2013-06-27 NOTE — Progress Notes (Signed)
   [address]   [place], [place]  [address]   [phone]  Subjective:    Patient ID: [name], female    DOB: 1946-03-12, 67 y.o.   MRN: [id number]  Sinusitis Associated symptoms include chills, congestion, diaphoresis, ear pain, sneezing and a sore throat. Pertinent negatives include no coughing or headaches.   This 66 y.o. female presents for evaluation of sore throat.  Onset three days ago.  +low grade fever; +feverish.  No headache.  +ear pains.  +nose is burning.  +rhinorrhea L sided; clear and yellow.   +ear pain B.  +pain with swallowing.  No cough.  No SOB.  +nausea; no vomiting; no diarrhea.  No rash.  No tobacco.  Retired.  [place] has been sick.  Tried Cold tablets nighttime and Sudafed.    PCP: [facility]. Review of Systems  Constitutional: Positive for chills and diaphoresis. Negative for fever and fatigue.  HENT: Positive for ear pain, congestion, sore throat, rhinorrhea, sneezing, trouble swallowing, voice change and postnasal drip.   Respiratory: Negative for cough.   Gastrointestinal: Positive for nausea. Negative for vomiting and diarrhea.  Neurological: Negative for headaches.       Objective:   Physical Exam  Nursing note and vitals reviewed. Constitutional: She is oriented to person, place, and time. She appears well-developed and well-nourished. No distress.  HENT:  Head: Normocephalic and atraumatic.  Right Ear: External ear normal.  Left Ear: External ear normal.  Nose: Nose normal.  Mouth/Throat: Oropharynx is clear and moist.  Eyes: Conjunctivae and EOM are normal. Pupils are equal, round, and reactive to light.  Neck: Normal range of motion. Neck supple.  Cardiovascular: Normal rate, regular rhythm and normal heart sounds.   No murmur heard. Pulmonary/Chest: Effort normal and breath sounds normal.  Lymphadenopathy:    She has cervical adenopathy.  Neurological: She is alert and oriented to person, place, and time.  Skin: Skin is warm and dry.  No rash noted. She is not diaphoretic.  Psychiatric: She has a normal mood and affect. Her behavior is normal.   Results for orders placed in visit on 06/27/13  POCT RAPID STREP A (OFFICE)      Result Value Range   Rapid Strep A Screen Negative  Negative       Assessment & Plan:  Acute pharyngitis - Plan: POCT rapid strep A, Culture, Group A Strep  Allergic rhinitis, cause unspecified  1. Acute Pharyngitis:  New.  Send throat culture; treat empirically with Amoxicillin while awaiting culture.  Supportive care with rest, fluids, Ibuprofen or Tylenol.  RTC inability to swallow. 2.  Allergic Rhinitis:  Uncontrolled; rx for Flonase provided.  Meds ordered this encounter  Medications  . fenofibrate (TRICOR) 145 MG tablet    Sig: Take 145 mg by mouth daily.  Marland Kitchen esomeprazole (NEXIUM) 20 MG packet    Sig: Take 20 mg by mouth daily before breakfast.  . fluticasone (FLONASE) 50 MCG/ACT nasal spray    Sig: Place 2 sprays into the nose daily.    Dispense:  16 g    Refill:  6  . amoxicillin (AMOXIL) 500 MG capsule    Sig: Take 2 capsules (1,000 mg total) by mouth 2 (two) times daily.    Dispense:  40 capsule    Refill:  0

## 2013-06-27 NOTE — Patient Instructions (Addendum)
1.  Recommend taking Zyrtec 10mg  one tablet daily for allergies.

## 2013-06-29 LAB — CULTURE, GROUP A STREP: Organism ID, Bacteria: NORMAL

## 2013-11-16 DIAGNOSIS — R7309 Other abnormal glucose: Secondary | ICD-10-CM | POA: Diagnosis not present

## 2013-11-16 DIAGNOSIS — J069 Acute upper respiratory infection, unspecified: Secondary | ICD-10-CM | POA: Diagnosis not present

## 2013-11-16 DIAGNOSIS — Z79899 Other long term (current) drug therapy: Secondary | ICD-10-CM | POA: Diagnosis not present

## 2013-11-16 DIAGNOSIS — J209 Acute bronchitis, unspecified: Secondary | ICD-10-CM | POA: Diagnosis not present

## 2013-11-16 DIAGNOSIS — E785 Hyperlipidemia, unspecified: Secondary | ICD-10-CM | POA: Diagnosis not present

## 2014-02-02 ENCOUNTER — Encounter (HOSPITAL_BASED_OUTPATIENT_CLINIC_OR_DEPARTMENT_OTHER): Payer: Self-pay | Admitting: Emergency Medicine

## 2014-02-02 ENCOUNTER — Emergency Department (HOSPITAL_BASED_OUTPATIENT_CLINIC_OR_DEPARTMENT_OTHER)
Admission: EM | Admit: 2014-02-02 | Discharge: 2014-02-02 | Disposition: A | Discharge status: Home / Self Care | Payer: Medicare Other | Attending: Emergency Medicine | Admitting: Emergency Medicine

## 2014-02-02 DIAGNOSIS — Z23 Encounter for immunization: Secondary | ICD-10-CM | POA: Insufficient documentation

## 2014-02-02 DIAGNOSIS — K219 Gastro-esophageal reflux disease without esophagitis: Secondary | ICD-10-CM | POA: Insufficient documentation

## 2014-02-02 DIAGNOSIS — W010XXA Fall on same level from slipping, tripping and stumbling without subsequent striking against object, initial encounter: Secondary | ICD-10-CM | POA: Insufficient documentation

## 2014-02-02 DIAGNOSIS — Y939 Activity, unspecified: Secondary | ICD-10-CM | POA: Insufficient documentation

## 2014-02-02 DIAGNOSIS — W19XXXA Unspecified fall, initial encounter: Secondary | ICD-10-CM

## 2014-02-02 DIAGNOSIS — S01501A Unspecified open wound of lip, initial encounter: Secondary | ICD-10-CM | POA: Diagnosis not present

## 2014-02-02 DIAGNOSIS — IMO0002 Reserved for concepts with insufficient information to code with codable children: Secondary | ICD-10-CM | POA: Insufficient documentation

## 2014-02-02 DIAGNOSIS — I10 Essential (primary) hypertension: Secondary | ICD-10-CM | POA: Insufficient documentation

## 2014-02-02 DIAGNOSIS — S025XXA Fracture of tooth (traumatic), initial encounter for closed fracture: Secondary | ICD-10-CM | POA: Diagnosis not present

## 2014-02-02 DIAGNOSIS — E785 Hyperlipidemia, unspecified: Secondary | ICD-10-CM | POA: Diagnosis not present

## 2014-02-02 DIAGNOSIS — Z79899 Other long term (current) drug therapy: Secondary | ICD-10-CM | POA: Insufficient documentation

## 2014-02-02 DIAGNOSIS — Y929 Unspecified place or not applicable: Secondary | ICD-10-CM | POA: Insufficient documentation

## 2014-02-02 DIAGNOSIS — M129 Arthropathy, unspecified: Secondary | ICD-10-CM | POA: Insufficient documentation

## 2014-02-02 DIAGNOSIS — S01511A Laceration without foreign body of lip, initial encounter: Secondary | ICD-10-CM

## 2014-02-02 MED ORDER — TETANUS-DIPHTH-ACELL PERTUSSIS 5-2.5-18.5 LF-MCG/0.5 IM SUSP
0.5000 mL | Freq: Once | INTRAMUSCULAR | Status: AC
  Administered 2014-02-02: 0.5 mL via INTRAMUSCULAR
  Filled 2014-02-02: qty 0.5

## 2014-02-02 NOTE — ED Provider Notes (Signed)
LACERATION REPAIR Performed by: Dewaine Oats Authorized by: Dewaine Oats Consent: Verbal consent obtained. Risks and benefits: risks, benefits and alternatives were discussed Consent given by: patient Patient identity confirmed: provided demographic data Prepped and Draped in normal sterile fashion Wound explored  Laceration Location: upper central lip  Laceration Length: 1 cm  Thru and thru wound explored extensively. No Foreign Bodies seen or palpated  Anesthesia: local infiltration  Local anesthetic: lidocaine 2% w/o epinephrine  Anesthetic total: 1 ml  Irrigation method: syringe Amount of cleaning: standard  Skin closure: 6-0 eithilon externally, 6-0 Vicryl Rapid buccal lip  Number of sutures: 5 total - 3 cutaneous sutures, 2 buccal sutures  Technique: simple interrupted  Patient tolerance: Patient tolerated the procedure well with no immediate complications.   Dewaine Oats, PA-C 02/02/14 2229

## 2014-02-02 NOTE — ED Provider Notes (Signed)
CSN: [id number]     Arrival date & time 02/02/14  2009 History   This chart was scribed for Shaune Pollack, MD by Elby Beck, ED Scribe. This patient was seen in room MH03/MH03 and the patient's care was started at 9:16 PM     Chief Complaint  Patient presents with  . Facial Injury     (Consider location/radiation/quality/duration/timing/severity/associated sxs/prior Treatment) Patient is a 68 y.o. female presenting with facial injury. The history is provided by the patient and a relative. No language interpreter was used.  Facial Injury Mechanism of injury:  Fall Location:  Mouth Mouth location:  Lip(s) (upper lip) Time since incident:  2 hours Pain details:    Quality:  Unable to specify   Severity:  Moderate   Duration:  2 hours   Timing:  Constant   Progression:  Unchanged Chronicity:  New Foreign body present:  Unable to specify Relieved by:  None tried Worsened by:  Nothing tried Ineffective treatments:  None tried Associated symptoms: no headaches and no loss of consciousness    HPI Comments: [name] is a 68 y.o. female who presents to the Emergency Department complaining of a laceration to the face that occurred at 7:15 PM tonight. The laceration is located on the patient's upper lip.  Bleeding for the laceration is controlled. Patient states that she tripped and fell on her face when the fall occurred. Patient denies any LOC or headache. Patient has an associated mild abrasion to her knee that occurred because of the fall but states she has no trouble ambulating due to the injury. Patient also has associated broken dental bridge that occurred with the fall. Patient states that she is currently on medications for her HTN, hyperlipidemia, DM.  PCP: Dr. Jabier Mutton    Past Medical History  Diagnosis Date  . Hypertension   . Peripheral vascular disease     has spider veins  . GERD (gastroesophageal reflux disease)     at times  . Arthritis   .  Hyperlipidemia    Past Surgical History  Procedure Laterality Date  . 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 Mastectomy  2008    right breast  . Joint replacement  2009    right knee  . Joint replacement  2011    left knee  . Shoulder arthroscopy distal clavicle excision and open rotator cuff repair  2011  . Abdominal hysterectomy  1982  . Breast surgery    . Total shoulder arthroplasty  03/14/2012    Procedure: TOTAL SHOULDER ARTHROPLASTY;  Surgeon: Alta Corning, MD;  Location: [facility];  Service: Orthopedics;  Laterality: Left;   Family History  Problem Relation Age of Onset  . Anesthesia problems Neg Hx   . Hypotension Neg Hx   . Malignant hyperthermia Neg Hx   . Pseudochol deficiency Neg Hx    History  Substance Use Topics  . Smoking status: Never Smoker   . Smokeless tobacco: Never Used  . Alcohol Use: No   OB History   Grav Para Term Preterm Abortions TAB SAB Ect Mult Living                 Review of Systems  Skin: Positive for wound (upper lip).  Neurological: Negative for loss of consciousness, syncope and headaches.  All other systems reviewed and are negative.     Allergies  Dilaudid and Morphine and related  Home Medications   Prior to  Admission medications   Medication Sig Start Date End Date Taking? Authorizing Provider  TRAMADOL HCL PO Take by mouth.   Yes Historical Provider, MD  acetaminophen (TYLENOL) 325 MG tablet Take 650 mg by mouth every 6 (six) hours as needed.    Historical Provider, MD  amoxicillin (AMOXIL) 500 MG capsule Take 2 capsules (1,000 mg total) by mouth 2 (two) times daily. 06/27/13   Wardell Honour, MD  amoxicillin-clavulanate (AUGMENTIN) 875-125 MG per tablet Take 1 tablet by mouth 2 (two) times daily. 07/08/12   Ellison Carwin, MD  atorvastatin (LIPITOR) 20 MG tablet Take 20 mg by mouth daily.    Historical Provider, MD  bisacodyl (DULCOLAX) 5 MG EC tablet Take 5-10 mg by mouth daily. For constipation    Historical Provider,  MD  Calcium Carbonate-Vitamin D (CALCIUM-VITAMIN D) 600-200 MG-UNIT CAPS Take 1 capsule by mouth daily.    Historical Provider, MD  chlorpheniramine-HYDROcodone (TUSSIONEX PENNKINETIC ER) 10-8 MG/5ML LQCR Take 5 mLs by mouth every 12 (twelve) hours as needed (cough). 07/14/12   Heather Elnora Morrison, PA-C  dextromethorphan-guaiFENesin (MUCINEX DM) 30-600 MG per 12 hr tablet Take 1 tablet by mouth every 12 (twelve) hours.    Historical Provider, MD  DiphenhydrAMINE HCl, Sleep, (UNISOM SLEEPGELS) 50 MG CAPS Take 2 capsules by mouth at bedtime.    Historical Provider, MD  DM-Doxylamine-Acetaminophen (NYQUIL COLD & FLU PO) Take by mouth.    Historical Provider, MD  esomeprazole (NEXIUM) 20 MG packet Take 20 mg by mouth daily before breakfast.    Historical Provider, MD  fenofibrate (TRICOR) 145 MG tablet Take 145 mg by mouth daily.    Historical Provider, MD  fluticasone (FLONASE) 50 MCG/ACT nasal spray Place 2 sprays into the nose daily. 06/27/13   Wardell Honour, MD  ibuprofen (ADVIL,MOTRIN) 100 MG tablet Take 100 mg by mouth every 6 (six) hours as needed.    Historical Provider, MD  Multiple Vitamin (MULITIVITAMIN WITH MINERALS) TABS Take 1 tablet by mouth daily.    Historical Provider, MD  olmesartan-hydrochlorothiazide (BENICAR HCT) 20-12.5 MG per tablet Take 1 tablet by mouth daily.    Historical Provider, MD   Triage Vitals: BP 154/83  Pulse 90  Temp(Src) 98.1 F (36.7 C) (Oral)  Resp 20  Ht 5\' 5"  (1.651 m)  Wt 170 lb (77.111 kg)  BMI 28.29 kg/m2  SpO2 100%  Physical Exam  Nursing note and vitals reviewed. Constitutional: She is oriented to person, place, and time. She appears well-developed and well-nourished. No distress.  HENT:  Head: Normocephalic.  2 lacerations 1 cm laceration in left lateral upper lip, extends to the [place] border, but no through and through 1.5 cm laceration at [place] border, appears to be through and through, with 1.5 cm internal laceration  Eyes: EOM are  normal.  Neck: Neck supple. No tracheal deviation present.  Cardiovascular: Normal rate.   Pulmonary/Chest: Effort normal. No respiratory distress.  Musculoskeletal: Normal range of motion.  Neurological: She is alert and oriented to person, place, and time.  Skin: Skin is warm and dry.  Psychiatric: She has a normal mood and affect. Her behavior is normal.    ED Course  Procedures (including critical care time)  DIAGNOSTIC STUDIES: Oxygen Saturation is 100% on RA, normal by my interpretation.    COORDINATION OF CARE: 9:23 PM- Discussed plan for laceration repair. Will update patient's Tdap vaccination. Pt advised of plan for treatment and pt agrees.   Labs Review Labs Reviewed - No data to display  Imaging Review No results found.   EKG Interpretation None      MDM   Final diagnoses:  Laceration of lip, complicated  Fall  I personally performed the services described in this documentation, which was scribed in my presence. The recorded information has been reviewed and considered.    Shaune Pollack, MD 02/09/14 [phone]

## 2014-02-02 NOTE — ED Notes (Addendum)
Pt ripped/feel-lac above mouth-bleeding controlled-holding gauze-denies head/neck pain-denies LOC

## 2014-02-02 NOTE — Discharge Instructions (Signed)
Mouth Laceration A mouth laceration is a cut inside the mouth.  HOME CARE  Rinse your mouth with warm salt water 4 to 6 times a day.  Brush your teeth as usual if you can.  Do not eat hot food or have hot drinks while your mouth is still numb.  Avoid acidic foods or other foods that bother your cut.  Only take medicine as told by your doctor.  Keep all doctor visits as told.  If there are stitches (sutures) in the mouth, do not play with them with your tongue. You may need a tetanus shot if:  You cannot remember when you had your last tetanus shot.  You have never had a tetanus shot. If you need a tetanus shot and you choose not to have one, you may get tetanus. Sickness from tetanus can be serious. GET HELP RIGHT AWAY IF:   Your cut or other parts of your face are puffy (swollen) or painful.  You have a fever.  Your throat is puffy or tender.  Your cut breaks open after stiches have been removed.  You see yellowish-white fluid (pus) coming from the cut. MAKE SURE YOU:   Understand these instructions.  Will watch your condition.  Will get help right away if you are not doing well or get worse. Document Released: 03/19/2008 Document Revised: 12/24/2011 Document Reviewed: 04/05/2011 [facility] Patient Information 2014 [place], [place].

## 2014-02-03 NOTE — ED Provider Notes (Signed)
Please see my note History/physical exam/procedure(s) were performed by non-physician practitioner and as supervising physician I was immediately available for consultation/collaboration. I have reviewed all notes and am in agreement with care and plan.   Shaune Pollack, MD 02/03/14 [phone]

## 2014-02-08 DIAGNOSIS — S01501A Unspecified open wound of lip, initial encounter: Secondary | ICD-10-CM | POA: Diagnosis not present

## 2014-05-23 ENCOUNTER — Ambulatory Visit (INDEPENDENT_AMBULATORY_CARE_PROVIDER_SITE_OTHER): Payer: Medicare Other

## 2014-05-23 ENCOUNTER — Ambulatory Visit (INDEPENDENT_AMBULATORY_CARE_PROVIDER_SITE_OTHER): Payer: Medicare Other | Admitting: Family Medicine

## 2014-05-23 VITALS — BP 112/70 | HR 97 | Temp 98.5°F | Resp 20 | Ht 63.0 in | Wt 179.5 lb

## 2014-05-23 DIAGNOSIS — R059 Cough, unspecified: Secondary | ICD-10-CM

## 2014-05-23 DIAGNOSIS — J189 Pneumonia, unspecified organism: Secondary | ICD-10-CM | POA: Diagnosis not present

## 2014-05-23 DIAGNOSIS — J069 Acute upper respiratory infection, unspecified: Secondary | ICD-10-CM

## 2014-05-23 DIAGNOSIS — R05 Cough: Secondary | ICD-10-CM

## 2014-05-23 MED ORDER — AZITHROMYCIN 250 MG PO TABS: ORAL_TABLET | ORAL | Status: DC

## 2014-05-23 NOTE — Progress Notes (Addendum)
Subjective:  This chart was scribed for Merri Ray, MD by Thea Alken, ED Scribe. This patient was seen in room 1 and the patient's care was started at 12:32 PM.   Patient ID: [name], female    DOB: 01-Jan-1946, 68 y.o.   MRN: [id number]  URI  Associated symptoms include congestion and coughing.   Chief Complaint  Patient presents with  . URI   HPI Comments: [name] is a 68 y.o. female who presents to the [facility] complaining of URI symptoms that began 1 week ago. She reports symptoms started with a scratch in throat but gradually worsened throughout week to productive cough consisting discolored phlegm and nasal congestion. Pt reports associated SOB but has this often, body aches and a possible low grade fever. Pt has taken mucinex, generic mucinex DM, chest rub, night time multi symptoms relief, and tramadol for pain. Pt reports sick contacts, her grandson who had a double ear infection about a week ago. Pt reports h/o pneumonia in the past.    Past Medical History  Diagnosis Date  . Hypertension   . Peripheral vascular disease     has spider veins  . GERD (gastroesophageal reflux disease)     at times  . Arthritis   . Hyperlipidemia    Past Surgical History  Procedure Laterality Date  . 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 Mastectomy  2008    right breast  . Joint replacement  2009    right knee  . Joint replacement  2011    left knee  . Shoulder arthroscopy distal clavicle excision and open rotator cuff repair  2011  . Abdominal hysterectomy  1982  . Breast surgery    . 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 Authorizing Provider  acetaminophen (TYLENOL) 325 MG tablet Take 650 mg by mouth every 6 (six) hours as  needed.   Yes Historical Provider, MD  atorvastatin (LIPITOR) 20 MG tablet Take 20 mg by mouth daily.   Yes Historical Provider, MD  bisacodyl (DULCOLAX) 5 MG EC tablet Take 5-10 mg by mouth daily. For constipation   Yes Historical Provider, MD  Calcium Carbonate-Vitamin D (CALCIUM-VITAMIN D) 600-200 MG-UNIT CAPS Take 1 capsule by mouth daily.   Yes Historical Provider, MD  dextromethorphan-guaiFENesin (MUCINEX DM) 30-600 MG per 12 hr tablet Take 1 tablet by mouth every 12 (twelve) hours.   Yes Historical Provider, MD  DiphenhydrAMINE HCl, Sleep, (UNISOM SLEEPGELS) 50 MG CAPS Take 2 capsules by mouth at bedtime.   Yes Historical Provider, MD  fenofibrate (TRICOR) 145 MG tablet Take 145 mg by mouth daily.   Yes Historical Provider, MD  ibuprofen (ADVIL,MOTRIN) 100 MG tablet Take 100 mg by mouth every 6 (six) hours as needed.   Yes Historical Provider, MD  Multiple Vitamin (MULITIVITAMIN WITH MINERALS) TABS Take 1 tablet by mouth daily.   Yes Historical Provider, MD  olmesartan-hydrochlorothiazide (BENICAR HCT) 20-12.5 MG per tablet Take 1 tablet by mouth daily.   Yes Historical Provider, MD  TRAMADOL HCL PO Take by mouth.   Yes Historical Provider, MD  chlorpheniramine-HYDROcodone (TUSSIONEX PENNKINETIC ER) 10-8 MG/5ML LQCR Take 5 mLs by mouth every 12 (twelve) hours as needed (cough). 07/14/12   Heather  M Marte, PA-C  esomeprazole (NEXIUM) 20 MG packet Take 20 mg by mouth daily before breakfast.    Historical Provider, MD  fluticasone (FLONASE) 50 MCG/ACT nasal spray Place 2 sprays into the nose daily. 06/27/13   Wardell Honour, MD   Review of Systems  Constitutional: Positive for fever. Negative for chills.  HENT: Positive for congestion.   Respiratory: Positive for cough and shortness of breath.   Musculoskeletal: Positive for myalgias.     Objective:   Physical Exam  Vitals reviewed. Constitutional: She is oriented to person, place, and time. She appears well-developed and well-nourished.  No distress.  HENT:  Head: Normocephalic and atraumatic.  Right Ear: Hearing, tympanic membrane, external ear and ear canal normal.  Left Ear: Hearing, tympanic membrane, external ear and ear canal normal.  Nose: Nose normal.  Mouth/Throat: Oropharynx is clear and moist. No oropharyngeal exudate.  Eyes: Conjunctivae and EOM are normal. Pupils are equal, round, and reactive to light.  Cardiovascular: Normal rate, regular rhythm, normal heart sounds and intact distal pulses.   No murmur heard. Pulmonary/Chest: Effort normal and breath sounds normal. No respiratory distress. She has no wheezes. She has no rhonchi.  Musculoskeletal:  No LE edema  Neurological: She is alert and oriented to person, place, and time.  Skin: Skin is warm and dry. No rash noted.  Psychiatric: She has a normal mood and affect. Her behavior is normal.   Filed Vitals:   05/23/14 1156  BP: 112/70  Pulse: 97  Temp: 98.5 F (36.9 C)  TempSrc: Oral  Resp: 20  Height: 5\' 3"  (1.6 m)  Weight: 179 lb 8 oz (81.421 kg)  SpO2: 97%   [facility] reading (PRIMARY) by  Dr. Carlota Raspberry: infiltrate vs incr markings RLL.    Assessment & Plan:   [name] is a 68 y.o. female Pneumonia, organism unspecified - Plan: DG Chest 2 View, azithromycin (ZITHROMAX) 250 MG tablet  Cough - Plan: DG Chest 2 View, azithromycin (ZITHROMAX) 250 MG tablet  Acute upper respiratory infections of unspecified site - Plan: DG Chest 2 View LRTI with possible early CAP vs bronchitis. Hx of pneumonia.   -start zpak, mucinex/saline ns/sx care, has ultram if needed for chest wall pain with coughing.   -ER/rtc precautions.   Meds ordered this encounter           . azithromycin (ZITHROMAX) 250 MG tablet    Sig: Take 2 pills by mouth on day 1, then 1 pill by mouth per day on days 2 through 5.    Dispense:  6 tablet    Refill:  0   Patient Instructions  Saline nasal spray if needed for  over the counter mucinex or mucinex DM as needed for cough,  ultram if needed for chest wall soreness as prescribed by your primary doctor.   Can start antibiotic as discussed for possible early pneumonia.  If you are not starting to improve in the next 3-4 days, return for recheck.  Return to the clinic or go to the nearest emergency room if any of your symptoms worsen or new symptoms occur.  Pneumonia Pneumonia is an infection of the lungs.  CAUSES Pneumonia may be caused by bacteria or a virus. Usually, these infections are caused by breathing infectious particles into the lungs (respiratory tract). SIGNS AND SYMPTOMS   Cough.  Fever.  Chest pain.  Increased rate of breathing.  Wheezing.  Mucus production. DIAGNOSIS  If you have the common symptoms of pneumonia, your  health care provider will typically confirm the diagnosis with a chest X-ray. The X-ray will show an abnormality in the lung (pulmonary infiltrate) if you have pneumonia. Other tests of your blood, urine, or sputum may be done to find the specific cause of your pneumonia. Your health care provider may also do tests (blood gases or pulse oximetry) to see how well your lungs are working. TREATMENT  Some forms of pneumonia may be spread to other people when you cough or sneeze. You may be asked to wear a mask before and during your exam. Pneumonia that is caused by bacteria is treated with antibiotic medicine. Pneumonia that is caused by the influenza virus may be treated with an antiviral medicine. Most other viral infections must run their course. These infections will not respond to antibiotics.  HOME CARE INSTRUCTIONS   Cough suppressants may be used if you are losing too much rest. However, coughing protects you by clearing your lungs. You should avoid using cough suppressants if you can.  Your health care provider may have prescribed medicine if he or she thinks your pneumonia is caused by bacteria or influenza. Finish your medicine even if you start to feel better.  Your  health care provider may also prescribe an expectorant. This loosens the mucus to be coughed up.  Take medicines only as directed by your health care provider.  Do not smoke. Smoking is a common cause of bronchitis and can contribute to pneumonia. If you are a smoker and continue to smoke, your cough may last several weeks after your pneumonia has cleared.  A cold steam vaporizer or humidifier in your room or home may help loosen mucus.  Coughing is often worse at night. Sleeping in a semi-upright position in a recliner or using a couple pillows under your head will help with this.  Get rest as you feel it is needed. Your body will usually let you know when you need to rest. PREVENTION A pneumococcal shot (vaccine) is available to prevent a common bacterial cause of pneumonia. This is usually suggested for:  People over 71 years old.  Patients on chemotherapy.  People with chronic lung problems, such as bronchitis or emphysema.  People with immune system problems. If you are over 65 or have a high risk condition, you may receive the pneumococcal vaccine if you have not received it before. In some countries, a routine influenza vaccine is also recommended. This vaccine can help prevent some cases of pneumonia.You may be offered the influenza vaccine as part of your care. If you smoke, it is time to quit. You may receive instructions on how to stop smoking. Your health care provider can provide medicines and counseling to help you quit. SEEK MEDICAL CARE IF: You have a fever. SEEK IMMEDIATE MEDICAL CARE IF:   Your illness becomes worse. This is especially true if you are elderly or weakened from any other disease.  You cannot control your cough with suppressants and are losing sleep.  You begin coughing up blood.  You develop pain which is getting worse or is uncontrolled with medicines.  Any of the symptoms which initially brought you in for treatment are getting worse rather than  better.  You develop shortness of breath or chest pain. MAKE SURE YOU:   Understand these instructions.  Will watch your condition.  Will get help right away if you are not doing well or get worse. Document Released: 10/01/2005 Document Revised: 02/15/2014 Document Reviewed: 12/21/2010 [employer] Patient Information 2015 [place]. This information is not intended to replace advice given to you by your health care provider. Make sure you discuss any questions you have with your health care provider.

## 2014-05-23 NOTE — Patient Instructions (Addendum)
Saline nasal spray if needed for  over the counter mucinex or mucinex DM as needed for cough, ultram if needed for chest wall soreness as prescribed by your primary doctor.   Can start antibiotic as discussed for possible early pneumonia.  If you are not starting to improve in the next 3-4 days, return for recheck.  Return to the clinic or go to the nearest emergency room if any of your symptoms worsen or new symptoms occur.  Pneumonia Pneumonia is an infection of the lungs.  CAUSES Pneumonia may be caused by bacteria or a virus. Usually, these infections are caused by breathing infectious particles into the lungs (respiratory tract). SIGNS AND SYMPTOMS   Cough.  Fever.  Chest pain.  Increased rate of breathing.  Wheezing.  Mucus production. DIAGNOSIS  If you have the common symptoms of pneumonia, your health care provider will typically confirm the diagnosis with a chest X-ray. The X-ray will show an abnormality in the lung (pulmonary infiltrate) if you have pneumonia. Other tests of your blood, urine, or sputum may be done to find the specific cause of your pneumonia. Your health care provider may also do tests (blood gases or pulse oximetry) to see how well your lungs are working. TREATMENT  Some forms of pneumonia may be spread to other people when you cough or sneeze. You may be asked to wear a mask before and during your exam. Pneumonia that is caused by bacteria is treated with antibiotic medicine. Pneumonia that is caused by the influenza virus may be treated with an antiviral medicine. Most other viral infections must run their course. These infections will not respond to antibiotics.  HOME CARE INSTRUCTIONS   Cough suppressants may be used if you are losing too much rest. However, coughing protects you by clearing your lungs. You should avoid using cough suppressants if you can.  Your health care provider may have prescribed medicine if he or she thinks your pneumonia is caused  by bacteria or influenza. Finish your medicine even if you start to feel better.  Your health care provider may also prescribe an expectorant. This loosens the mucus to be coughed up.  Take medicines only as directed by your health care provider.  Do not smoke. Smoking is a common cause of bronchitis and can contribute to pneumonia. If you are a smoker and continue to smoke, your cough may last several weeks after your pneumonia has cleared.  A cold steam vaporizer or humidifier in your room or home may help loosen mucus.  Coughing is often worse at night. Sleeping in a semi-upright position in a recliner or using a couple pillows under your head will help with this.  Get rest as you feel it is needed. Your body will usually let you know when you need to rest. PREVENTION A pneumococcal shot (vaccine) is available to prevent a common bacterial cause of pneumonia. This is usually suggested for:  People over 40 years old.  Patients on chemotherapy.  People with chronic lung problems, such as bronchitis or emphysema.  People with immune system problems. If you are over 65 or have a high risk condition, you may receive the pneumococcal vaccine if you have not received it before. In some countries, a routine influenza vaccine is also recommended. This vaccine can help prevent some cases of pneumonia.You may be offered the influenza vaccine as part of your care. If you smoke, it is time to quit. You may receive instructions on how to stop smoking. Your  health care provider can provide medicines and counseling to help you quit. SEEK MEDICAL CARE IF: You have a fever. SEEK IMMEDIATE MEDICAL CARE IF:   Your illness becomes worse. This is especially true if you are elderly or weakened from any other disease.  You cannot control your cough with suppressants and are losing sleep.  You begin coughing up blood.  You develop pain which is getting worse or is uncontrolled with medicines.  Any of  the symptoms which initially brought you in for treatment are getting worse rather than better.  You develop shortness of breath or chest pain. MAKE SURE YOU:   Understand these instructions.  Will watch your condition.  Will get help right away if you are not doing well or get worse. Document Released: 10/01/2005 Document Revised: 02/15/2014 Document Reviewed: 12/21/2010 [facility] Patient Information 2015 [place], [place]. This information is not intended to replace advice given to you by your health care provider. Make sure you discuss any questions you have with your health care provider.

## 2014-05-28 ENCOUNTER — Other Ambulatory Visit: Payer: Self-pay

## 2014-05-28 DIAGNOSIS — Z853 Personal history of malignant neoplasm of breast: Secondary | ICD-10-CM

## 2014-05-28 DIAGNOSIS — Z9011 Acquired absence of right breast and nipple: Secondary | ICD-10-CM

## 2014-05-28 DIAGNOSIS — Z1231 Encounter for screening mammogram for malignant neoplasm of breast: Secondary | ICD-10-CM

## 2014-06-15 ENCOUNTER — Ambulatory Visit
Admission: RE | Admit: 2014-06-15 | Discharge: 2014-06-15 | Disposition: A | Discharge status: Home / Self Care | Payer: Medicare Other | Source: Ambulatory Visit

## 2014-06-15 DIAGNOSIS — Z9011 Acquired absence of right breast and nipple: Secondary | ICD-10-CM

## 2014-06-15 DIAGNOSIS — Z1231 Encounter for screening mammogram for malignant neoplasm of breast: Secondary | ICD-10-CM

## 2014-07-13 ENCOUNTER — Ambulatory Visit (HOSPITAL_COMMUNITY)
Admission: RE | Admit: 2014-07-13 | Discharge: 2014-07-13 | Disposition: A | Discharge status: Home / Self Care | Payer: Medicare Other | Source: Ambulatory Visit | Attending: Family Medicine | Admitting: Family Medicine

## 2014-07-13 ENCOUNTER — Other Ambulatory Visit (HOSPITAL_COMMUNITY): Payer: Self-pay | Admitting: Family Medicine

## 2014-07-13 DIAGNOSIS — Z79899 Other long term (current) drug therapy: Secondary | ICD-10-CM | POA: Diagnosis not present

## 2014-07-13 DIAGNOSIS — J189 Pneumonia, unspecified organism: Secondary | ICD-10-CM | POA: Diagnosis not present

## 2014-07-13 DIAGNOSIS — Z09 Encounter for follow-up examination after completed treatment for conditions other than malignant neoplasm: Secondary | ICD-10-CM | POA: Diagnosis not present

## 2014-07-13 DIAGNOSIS — E785 Hyperlipidemia, unspecified: Secondary | ICD-10-CM | POA: Diagnosis not present

## 2014-07-13 DIAGNOSIS — Z0000 Encounter for general adult medical examination without abnormal findings: Secondary | ICD-10-CM | POA: Diagnosis not present

## 2014-07-13 DIAGNOSIS — R7309 Other abnormal glucose: Secondary | ICD-10-CM | POA: Diagnosis not present

## 2014-09-13 DIAGNOSIS — Z23 Encounter for immunization: Secondary | ICD-10-CM | POA: Diagnosis not present

## 2014-09-14 DIAGNOSIS — H2513 Age-related nuclear cataract, bilateral: Secondary | ICD-10-CM | POA: Diagnosis not present

## 2014-09-14 DIAGNOSIS — H40013 Open angle with borderline findings, low risk, bilateral: Secondary | ICD-10-CM | POA: Diagnosis not present

## 2014-10-16 ENCOUNTER — Ambulatory Visit (INDEPENDENT_AMBULATORY_CARE_PROVIDER_SITE_OTHER): Payer: Medicare Other | Admitting: Family Medicine

## 2014-10-16 VITALS — BP 124/76 | HR 86 | Temp 97.7°F | Resp 18 | Ht 63.0 in | Wt 180.6 lb

## 2014-10-16 DIAGNOSIS — R05 Cough: Secondary | ICD-10-CM | POA: Diagnosis not present

## 2014-10-16 DIAGNOSIS — J22 Unspecified acute lower respiratory infection: Secondary | ICD-10-CM

## 2014-10-16 DIAGNOSIS — J988 Other specified respiratory disorders: Secondary | ICD-10-CM

## 2014-10-16 DIAGNOSIS — R059 Cough, unspecified: Secondary | ICD-10-CM

## 2014-10-16 DIAGNOSIS — J9801 Acute bronchospasm: Secondary | ICD-10-CM | POA: Diagnosis not present

## 2014-10-16 MED ORDER — AZITHROMYCIN 250 MG PO TABS: ORAL_TABLET | ORAL | Status: DC

## 2014-10-16 MED ORDER — ALBUTEROL SULFATE HFA 108 (90 BASE) MCG/ACT IN AERS: 2.0000 | INHALATION_SPRAY | Freq: Four times a day (QID) | RESPIRATORY_TRACT | Status: DC | PRN

## 2014-10-16 NOTE — Patient Instructions (Signed)
1. Continue Mucinex every 4-6 hours.   Acute Bronchitis Bronchitis is inflammation of the airways that extend from the windpipe into the lungs (bronchi). The inflammation often causes mucus to develop. This leads to a cough, which is the most common symptom of bronchitis.  In acute bronchitis, the condition usually develops suddenly and goes away over time, usually in a couple weeks. Smoking, allergies, and asthma can make bronchitis worse. Repeated episodes of bronchitis may cause further lung problems.  CAUSES Acute bronchitis is most often caused by the same virus that causes a cold. The virus can spread from person to person (contagious) through coughing, sneezing, and touching contaminated objects. SIGNS AND SYMPTOMS   Cough.   Fever.   Coughing up mucus.   Body aches.   Chest congestion.   Chills.   Shortness of breath.   Sore throat.  DIAGNOSIS  Acute bronchitis is usually diagnosed through a physical exam. Your health care provider will also ask you questions about your medical history. Tests, such as chest X-rays, are sometimes done to rule out other conditions.  TREATMENT  Acute bronchitis usually goes away in a couple weeks. Oftentimes, no medical treatment is necessary. Medicines are sometimes given for relief of fever or cough. Antibiotic medicines are usually not needed but may be prescribed in certain situations. In some cases, an inhaler may be recommended to help reduce shortness of breath and control the cough. A cool mist vaporizer may also be used to help thin bronchial secretions and make it easier to clear the chest.  HOME CARE INSTRUCTIONS  Get plenty of rest.   Drink enough fluids to keep your urine clear or pale yellow (unless you have a medical condition that requires fluid restriction). Increasing fluids may help thin your respiratory secretions (sputum) and reduce chest congestion, and it will prevent dehydration.   Take medicines only as  directed by your health care provider.  If you were prescribed an antibiotic medicine, finish it all even if you start to feel better.  Avoid smoking and secondhand smoke. Exposure to cigarette smoke or irritating chemicals will make bronchitis worse. If you are a smoker, consider using nicotine gum or skin patches to help control withdrawal symptoms. Quitting smoking will help your lungs heal faster.   Reduce the chances of another bout of acute bronchitis by washing your hands frequently, avoiding people with cold symptoms, and trying not to touch your hands to your mouth, nose, or eyes.   Keep all follow-up visits as directed by your health care provider.  SEEK MEDICAL CARE IF: Your symptoms do not improve after 1 week of treatment.  SEEK IMMEDIATE MEDICAL CARE IF:  You develop an increased fever or chills.   You have chest pain.   You have severe shortness of breath.  You have bloody sputum.   You develop dehydration.  You faint or repeatedly feel like you are going to pass out.  You develop repeated vomiting.  You develop a severe headache. MAKE SURE YOU:   Understand these instructions.  Will watch your condition.  Will get help right away if you are not doing well or get worse. Document Released: 11/08/2004 Document Revised: 02/15/2014 Document Reviewed: 03/24/2013 [facility] Patient Information 2015 [place], [place]. This information is not intended to replace advice given to you by your health care provider. Make sure you discuss any questions you have with your health care provider.

## 2014-10-16 NOTE — Progress Notes (Signed)
Subjective:  This chart was scribed for Reginia Forts, MD by Dellis Filbert, ED Scribe at [facility].The patient was seen in exam room 03 and the patient's care was started at 2:11 PM.   Patient ID: [name], female    DOB: [birth year], 69 y.o.   MRN: [id number]  10/16/2014  Cough; Sore Throat; and Ear Pain  HPI  HPI Comments: [name] is a 69 y.o. female who presents to [facility] complaining of cough onset 3 days ago. Pt states her cough is a dry heavy cough. She has ear pain bilaterally, sore throat, pain with swallowing as associated symptoms. Pt does wake up in the middle of the night due to her cough. She has been taking mucus DM every 4 hours and a night time cold medicine every 6 hours for relief. Pt states she has a HX of pneumonia. She reports having SOB with activity and wheezing at baseline. Pt has had sick contacts, both grandchildren have had strep throat in past month. Pt does not have Hx of asthma, she has used albuterol in past. Last seen here in August for pneumonia; Dr, Mickeal Needy is her PCP and he is aware of her ongoing wheezing. Pt has gotten the influenza vaccine and Prevnar 13 this season. She denies fever, chills, sweats, HA, rhinorrhea, nasal congestion, nausea, vomiting, and diarrhea.  Review of Systems  Constitutional: Positive for fatigue. Negative for fever, chills and diaphoresis.  HENT: Positive for ear pain, sore throat and voice change. Negative for congestion, postnasal drip, rhinorrhea, sinus pressure and trouble swallowing.   Respiratory: Positive for cough, shortness of breath and wheezing.   Gastrointestinal: Negative for nausea, vomiting, abdominal pain and diarrhea.  Skin: Negative for rash.  Neurological: Negative for headaches.    Past Medical History  Diagnosis Date  . Hypertension   . Peripheral vascular disease     has spider veins  . GERD (gastroesophageal reflux disease)     at times  . Arthritis   . Hyperlipidemia     Past Surgical History  Procedure Laterality Date  . 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 Mastectomy  2008    right breast  . Joint replacement  2009    right knee  . Joint replacement  2011    left knee  . Shoulder arthroscopy distal clavicle excision and open rotator cuff repair  2011  . Abdominal hysterectomy  1982  . Breast surgery    . Total shoulder arthroplasty  03/14/2012    Procedure: TOTAL SHOULDER ARTHROPLASTY;  Surgeon: Alta Corning, MD;  Location: [facility];  Service: Orthopedics;  Laterality: Left;   Allergies  Allergen Reactions  . Dilaudid [Hydromorphone Hcl] Itching  . Morphine And Related Nausea And Vomiting   Current Outpatient Prescriptions  Medication Sig Dispense Refill  . acetaminophen (TYLENOL) 325 MG tablet Take 650 mg by mouth every 6 (six) hours as needed.    Marland Kitchen atorvastatin (LIPITOR) 20 MG tablet Take 20 mg by mouth daily.    Marland Kitchen azithromycin (ZITHROMAX) 250 MG tablet Take 2 pills by mouth on day 1, then 1 pill by mouth per day on days 2 through 5. 6 tablet 0  . bisacodyl (DULCOLAX) 5 MG EC tablet Take 5-10 mg by mouth daily. For constipation    . Calcium Carbonate-Vitamin D (CALCIUM-VITAMIN D) 600-200 MG-UNIT CAPS Take 1 capsule by mouth daily.    . chlorpheniramine-HYDROcodone (TUSSIONEX PENNKINETIC ER) 10-8 MG/5ML [facility]  Take 5 mLs by mouth every 12 (twelve) hours as needed (cough). 60 mL 0  . dextromethorphan-guaiFENesin (MUCINEX DM) 30-600 MG per 12 hr tablet Take 1 tablet by mouth every 12 (twelve) hours.    . DiphenhydrAMINE HCl, Sleep, (UNISOM SLEEPGELS) 50 MG CAPS Take 2 capsules by mouth at bedtime.    Marland Kitchen esomeprazole (NEXIUM) 20 MG packet Take 20 mg by mouth daily before breakfast.    . fenofibrate (TRICOR) 145 MG tablet Take 145 mg by mouth daily.    . fluticasone (FLONASE) 50 MCG/ACT nasal spray Place 2 sprays into the nose daily. 16 g 6  . ibuprofen (ADVIL,MOTRIN) 100 MG tablet Take 100 mg by mouth every 6 (six) hours as needed.    . Multiple  Vitamin (MULITIVITAMIN WITH MINERALS) TABS Take 1 tablet by mouth daily.    Marland Kitchen olmesartan-hydrochlorothiazide (BENICAR HCT) 20-12.5 MG per tablet Take 1 tablet by mouth daily.    . TRAMADOL HCL PO Take by mouth.    Marland Kitchen albuterol (PROVENTIL HFA;VENTOLIN HFA) 108 (90 BASE) MCG/ACT inhaler Inhale 2 puffs into the lungs every 6 (six) hours as needed for wheezing or shortness of breath (cough, shortness of breath or wheezing.). 1 Inhaler 1   No current facility-administered medications for this visit.       Objective:    BP 124/76 mmHg  Pulse 86  Temp(Src) 97.7 F (36.5 C) (Oral)  Resp 18  Ht 5\' 3"  (1.6 m)  Wt 180 lb 9.6 oz (81.92 kg)  BMI 32.00 kg/m2  SpO2 96% Physical Exam  Constitutional: She is oriented to person, place, and time. She appears well-developed and well-nourished. No distress.  HENT:  Head: Normocephalic and atraumatic.  Right Ear: External ear normal.  Left Ear: External ear normal.  Nose: Nose normal.  Mouth/Throat: Oropharynx is clear and moist.  Eyes: Conjunctivae and EOM are normal. Pupils are equal, round, and reactive to light.  Neck: Normal range of motion. Neck supple.  Cardiovascular: Normal rate, regular rhythm and normal heart sounds.  Exam reveals no gallop and no friction rub.   No murmur heard. No edema.  Pulmonary/Chest: Effort normal and breath sounds normal. She has no wheezes. She has no rales.  Lymphadenopathy:    She has no cervical adenopathy.  Neurological: She is alert and oriented to person, place, and time. No cranial nerve deficit. She exhibits normal muscle tone. Coordination normal.  Skin: Skin is warm and dry. She is not diaphoretic.  Psychiatric: She has a normal mood and affect. Her behavior is normal.  Nursing note and vitals reviewed.      Assessment & Plan:   1. Lower respiratory infection   2. Bronchospasm   3. Cough     1.  Lower respiratory infection: New.  Rx for Zithromax provided; continue with mucous DM and nighttime  cough.  Rx for Zithromax provided. 2.  Bronchospasm: New/recurrent; rx for Albuterol provided to use qid for one week scheduled and then PRN.  Follow-up PCP for chronic intermittent wheezing.    Meds ordered this encounter  Medications  . azithromycin (ZITHROMAX) 250 MG tablet    Sig: Take 2 pills by mouth on day 1, then 1 pill by mouth per day on days 2 through 5.    Dispense:  6 tablet    Refill:  0  . albuterol (PROVENTIL HFA;VENTOLIN HFA) 108 (90 BASE) MCG/ACT inhaler    Sig: Inhale 2 puffs into the lungs every 6 (six) hours as needed for wheezing or shortness  of breath (cough, shortness of breath or wheezing.).    Dispense:  1 Inhaler    Refill:  1    No Follow-up on file.    I personally performed the services described in this documentation, which was scribed in my presence. The recorded information has been reviewed and considered.   Kristi Elayne Guerin, M.D. [facility] [address] [place], [place]  [address] [phone] phone [phone] fax

## 2014-12-03 ENCOUNTER — Other Ambulatory Visit (HOSPITAL_COMMUNITY): Payer: Self-pay | Admitting: Family Medicine

## 2014-12-03 ENCOUNTER — Ambulatory Visit (HOSPITAL_COMMUNITY)
Admission: RE | Admit: 2014-12-03 | Discharge: 2014-12-03 | Disposition: A | Discharge status: Home / Self Care | Payer: Medicare Other | Source: Ambulatory Visit | Attending: Family Medicine | Admitting: Family Medicine

## 2014-12-03 DIAGNOSIS — R0600 Dyspnea, unspecified: Secondary | ICD-10-CM | POA: Diagnosis not present

## 2014-12-03 DIAGNOSIS — K2970 Gastritis, unspecified, without bleeding: Secondary | ICD-10-CM | POA: Diagnosis not present

## 2014-12-03 DIAGNOSIS — J209 Acute bronchitis, unspecified: Secondary | ICD-10-CM | POA: Diagnosis not present

## 2015-02-03 ENCOUNTER — Ambulatory Visit (INDEPENDENT_AMBULATORY_CARE_PROVIDER_SITE_OTHER): Payer: Medicare Other | Admitting: Physician Assistant

## 2015-02-03 ENCOUNTER — Ambulatory Visit (INDEPENDENT_AMBULATORY_CARE_PROVIDER_SITE_OTHER): Payer: Medicare Other

## 2015-02-03 VITALS — BP 140/78 | HR 97 | Temp 98.0°F | Resp 20 | Ht 63.5 in | Wt 184.1 lb

## 2015-02-03 DIAGNOSIS — M79604 Pain in right leg: Secondary | ICD-10-CM | POA: Diagnosis not present

## 2015-02-03 NOTE — Patient Instructions (Signed)
Your xray was normal and your ankle exam was normal today. Making sure you rest the leg, elevate when you can, apply ice several times per day, and take tylenol every 4-6 hours as needed for the pain should help. If you notice the pain worsening or you start having fevers, chills, pus draining of the small cut, or expanding redness, please come back to see [place] right away.   Contusion A contusion is a deep bruise. Contusions are the result of an injury that caused bleeding under the skin. The contusion may turn blue, purple, or yellow. Minor injuries will give you a painless contusion, but more severe contusions may stay painful and swollen for a few weeks.  CAUSES  A contusion is usually caused by a blow, trauma, or direct force to an area of the body. SYMPTOMS   Swelling and redness of the injured area.  Bruising of the injured area.  Tenderness and soreness of the injured area.  Pain. DIAGNOSIS  The diagnosis can be made by taking a history and physical exam. An X-ray, CT scan, or MRI may be needed to determine if there were any associated injuries, such as fractures. TREATMENT  Specific treatment will depend on what area of the body was injured. In general, the best treatment for a contusion is resting, icing, elevating, and applying cold compresses to the injured area. Over-the-counter medicines may also be recommended for pain control. Ask your caregiver what the best treatment is for your contusion. HOME CARE INSTRUCTIONS   Put ice on the injured area.  Put ice in a plastic bag.  Place a towel between your skin and the bag.  Leave the ice on for 15-20 minutes, 3-4 times a day, or as directed by your health care provider.  Only take over-the-counter or prescription medicines for pain, discomfort, or fever as directed by your caregiver. Your caregiver may recommend avoiding anti-inflammatory medicines (aspirin, ibuprofen, and naproxen) for 48 hours because these medicines may  increase bruising.  Rest the injured area.  If possible, elevate the injured area to reduce swelling. SEEK IMMEDIATE MEDICAL CARE IF:   You have increased bruising or swelling.  You have pain that is getting worse.  Your swelling or pain is not relieved with medicines. MAKE SURE YOU:   Understand these instructions.  Will watch your condition.  Will get help right away if you are not doing well or get worse. Document Released: 07/11/2005 Document Revised: 10/06/2013 Document Reviewed: 08/06/2011 [facility] Patient Information 2015 [place], [place]. This information is not intended to replace advice given to you by your health care provider. Make sure you discuss any questions you have with your health care provider.

## 2015-02-03 NOTE — Progress Notes (Signed)
Subjective:    Patient ID: [name], female    DOB: 1946-03-16, 69 y.o.   MRN: [id number]  Chief Complaint  Patient presents with  . Fall    fall on saturday against the bathtub.  lower right leg pain with swelling that comes in the evening in her ankle.     Prior to Admission medications   Medication Sig Start Date End Date Taking? Authorizing Provider  acetaminophen (TYLENOL) 325 MG tablet Take 650 mg by mouth every 6 (six) hours as needed.   Yes Historical Provider, MD  albuterol (PROVENTIL HFA;VENTOLIN HFA) 108 (90 BASE) MCG/ACT inhaler Inhale 2 puffs into the lungs every 6 (six) hours as needed for wheezing or shortness of breath (cough, shortness of breath or wheezing.). 10/16/14  Yes Wardell Honour, MD  atorvastatin (LIPITOR) 20 MG tablet Take 20 mg by mouth daily.   Yes Historical Provider, MD  bisacodyl (DULCOLAX) 5 MG EC tablet Take 5-10 mg by mouth daily. For constipation   Yes Historical Provider, MD  Calcium Carbonate-Vitamin D (CALCIUM-VITAMIN D) 600-200 MG-UNIT CAPS Take 1 capsule by mouth daily.   Yes Historical Provider, MD  fenofibrate (TRICOR) 145 MG tablet Take 145 mg by mouth daily.   Yes Historical Provider, MD  ibuprofen (ADVIL,MOTRIN) 100 MG tablet Take 100 mg by mouth every 6 (six) hours as needed.   Yes Historical Provider, MD  Multiple Vitamin (MULITIVITAMIN WITH MINERALS) TABS Take 1 tablet by mouth daily.   Yes Historical Provider, MD  olmesartan-hydrochlorothiazide (BENICAR HCT) 20-12.5 MG per tablet Take 1 tablet by mouth daily.   Yes Historical Provider, MD  TRAMADOL HCL PO Take by mouth.   Yes Historical Provider, MD   Medications, allergies, past medical history, surgical history, family history, social history and problem list reviewed and updated.  HPI  69 yof presents with right lower leg pain.   Five days ago slipped and fell while getting into bathtub. Fell onto her right side. No LOC. Unsure if she twisted leg or how she landed.   Felt ok  for a few days but has been tender right lower leg past few days. Walking causes pain anterior right lower leg. Able to fully bear weight but tender. No ankle pain. Feels like it is puffy in the area.   Review of Systems No fevers, chills.     Objective:   Physical Exam  Constitutional: She is oriented to person, place, and time.  BP 140/78 mmHg  Pulse 97  Temp(Src) 98 F (36.7 C) (Oral)  Resp 20  Ht 5' 3.5" (1.613 m)  Wt 184 lb 2 oz (83.519 kg)  BMI 32.10 kg/m2  SpO2 95%   Musculoskeletal:       Right knee: Normal.       Right ankle: Normal. No tenderness. Achilles tendon exhibits normal Thompson's test results.       Right lower leg: She exhibits tenderness, bony tenderness and swelling.       Legs:      Right foot: Normal.  Point tenderness over circled area. Small approx 2cm abrasion in center of circled area. No surrounding erythema or induration. Slight swelling in area. No purulence. No pain at fibular head. Negative talar tilt test ankle. No ttp over ankle. No pain, erythema, or swelling posterior lower leg.   Neurological: She is alert and oriented to person, place, and time.  Psychiatric: She has a normal mood and affect. Her speech is normal and behavior is normal.  [facility] reading (PRIMARY) by  Dr. Lorelei Pont. Right tib/fib Findings: Normal     Assessment & Plan:   69 yof presents with right lower leg pain.   Right leg pain - Plan: DG Tibia/Fibula Right --normal xray, normal exam other than ttp over anterior lower leg --doubt dvt with no pain, swelling, erythema posterior leg --ankle exam normal --likely contusion to area --> RICE, tylenol prn --rtc with signs decrease strength, sensation, blood flow to foot or signs infection in area  Julieta Gutting, PA-C Physician Assistant-Certified [facility]  02/03/2015 6:54 PM

## 2015-03-12 ENCOUNTER — Ambulatory Visit (INDEPENDENT_AMBULATORY_CARE_PROVIDER_SITE_OTHER): Payer: Medicare Other | Admitting: Emergency Medicine

## 2015-03-12 VITALS — BP 138/70 | HR 91 | Temp 98.5°F | Ht 63.5 in | Wt 186.2 lb

## 2015-03-12 DIAGNOSIS — J40 Bronchitis, not specified as acute or chronic: Secondary | ICD-10-CM | POA: Diagnosis not present

## 2015-03-12 DIAGNOSIS — J209 Acute bronchitis, unspecified: Secondary | ICD-10-CM

## 2015-03-12 MED ORDER — ALBUTEROL SULFATE (2.5 MG/3ML) 0.083% IN NEBU
5.0000 mg | INHALATION_SOLUTION | Freq: Once | RESPIRATORY_TRACT | Status: AC
  Administered 2015-03-12: 5 mg via RESPIRATORY_TRACT

## 2015-03-12 MED ORDER — CLARITHROMYCIN 500 MG PO TABS
500.0000 mg | ORAL_TABLET | Freq: Two times a day (BID) | ORAL | Status: DC
Start: 2015-03-12 — End: 2015-11-28

## 2015-03-12 MED ORDER — IPRATROPIUM BROMIDE 0.02 % IN SOLN
0.5000 mg | Freq: Once | RESPIRATORY_TRACT | Status: AC
  Administered 2015-03-12: 0.5 mg via RESPIRATORY_TRACT

## 2015-03-12 MED ORDER — FLUTICASONE-SALMETEROL 250-50 MCG/DOSE IN AEPB: 1.0000 | INHALATION_SPRAY | Freq: Two times a day (BID) | RESPIRATORY_TRACT | Status: DC

## 2015-03-12 MED ORDER — HYDROCOD POLST-CPM POLST ER 10-8 MG/5ML PO SUER: 5.0000 mL | Freq: Two times a day (BID) | ORAL | Status: DC

## 2015-03-12 NOTE — Patient Instructions (Signed)

## 2015-03-12 NOTE — Progress Notes (Signed)
Subjective:  Patient ID: [name], female    DOB: Sep 14, 1946  Age: 69 y.o. MRN: [id number]  CC: Cough   HPI [name] presents for evaluation treatment for cough and wheezing. She has a history of reactive airway disease. She has been experiencing increased wheezing and shortness of breath with exertion over the last week she has nasal congestion and postnasal drainage mucopurulent [place]. She has no nasal discharge she has wheezing. She has a cough productive of mucopurulent sputum. She has no fever or chills. She is not able sleep due to coughing. She has no peripheral edema. No chest pain. No nausea vomiting. She has no improvement with over-the-counter medication. She's been out of her long-acting from the dilator stearate combination. She's been relying on her pro-air rescue inhaler.  Outpatient Prescriptions Prior to Visit  Medication Sig Dispense Refill  . acetaminophen (TYLENOL) 325 MG tablet Take 650 mg by mouth every 6 (six) hours as needed.    Marland Kitchen albuterol (PROVENTIL HFA;VENTOLIN HFA) 108 (90 BASE) MCG/ACT inhaler Inhale 2 puffs into the lungs every 6 (six) hours as needed for wheezing or shortness of breath (cough, shortness of breath or wheezing.). 1 Inhaler 1  . atorvastatin (LIPITOR) 20 MG tablet Take 20 mg by mouth daily.    . bisacodyl (DULCOLAX) 5 MG EC tablet Take 5-10 mg by mouth daily. For constipation    . Calcium Carbonate-Vitamin D (CALCIUM-VITAMIN D) 600-200 MG-UNIT CAPS Take 1 capsule by mouth daily.    . fenofibrate (TRICOR) 145 MG tablet Take 145 mg by mouth daily.    Marland Kitchen ibuprofen (ADVIL,MOTRIN) 100 MG tablet Take 100 mg by mouth every 6 (six) hours as needed.    . Multiple Vitamin (MULITIVITAMIN WITH MINERALS) TABS Take 1 tablet by mouth daily.    Marland Kitchen olmesartan-hydrochlorothiazide (BENICAR HCT) 20-12.5 MG per tablet Take 1 tablet by mouth daily.    . TRAMADOL HCL PO Take by mouth.     No facility-administered medications prior to visit.    History    Social History  . Marital Status: Divorced    Spouse Name: N/A  . Number of Children: N/A  . Years of Education: N/A   Social History Main Topics  . Smoking status: Never Smoker   . Smokeless tobacco: Never Used  . Alcohol Use: No  . Drug Use: No  . Sexual Activity: Not on file   Other Topics Concern  . None   Social History Narrative    Family History  Problem Relation Age of Onset  . Anesthesia problems Neg Hx   . Hypotension Neg Hx   . Malignant hyperthermia Neg Hx   . Pseudochol deficiency Neg Hx     Past Medical History  Diagnosis Date  . Hypertension   . Peripheral vascular disease     has spider veins  . GERD (gastroesophageal reflux disease)     at times  . Arthritis   . Hyperlipidemia      Review of Systems  Constitutional: Positive for chills. Negative for fever and appetite change.  HENT: Positive for congestion, postnasal drip, rhinorrhea and sore throat. Negative for ear pain and sinus pressure.   Eyes: Negative for pain and redness.  Respiratory: Positive for cough, shortness of breath and wheezing.   Cardiovascular: Negative for leg swelling.  Gastrointestinal: Negative for nausea, vomiting, abdominal pain, diarrhea, constipation and blood in stool.  Endocrine: Negative for polyuria.  Genitourinary: Negative for dysuria, urgency, frequency and flank pain.  Musculoskeletal: Negative  for gait problem.  Skin: Negative for rash.  Neurological: Negative for weakness and headaches.  Psychiatric/Behavioral: Negative for confusion and decreased concentration. The patient is not nervous/anxious.     Objective:  BP 138/70 mmHg  Pulse 91  Temp(Src) 98.5 F (36.9 C) (Oral)  Ht 5' 3.5" (1.613 m)  Wt 186 lb 4 oz (84.482 kg)  BMI 32.47 kg/m2  SpO2 96%  BP Readings from Last 3 Encounters:  03/12/15 138/70  02/03/15 140/78  10/16/14 124/76    Wt Readings from Last 3 Encounters:  03/12/15 186 lb 4 oz (84.482 kg)  02/03/15 184 lb 2 oz (83.519  kg)  10/16/14 180 lb 9.6 oz (81.92 kg)    Physical Exam  Constitutional: She is oriented to person, place, and time. She appears well-developed and well-nourished. No distress.  HENT:  Head: Normocephalic and atraumatic.  Right Ear: External ear normal.  Left Ear: External ear normal.  Nose: Nose normal.  Eyes: Conjunctivae and EOM are normal. Pupils are equal, round, and reactive to light. No scleral icterus.  Neck: Normal range of motion. Neck supple. No tracheal deviation present.  Cardiovascular: Normal rate, regular rhythm and normal heart sounds.   Pulmonary/Chest: Effort normal. No respiratory distress. She has wheezes. She has no rales.  Abdominal: She exhibits no mass. There is no tenderness. There is no rebound and no guarding.  Musculoskeletal: She exhibits no edema.  Lymphadenopathy:    She has no cervical adenopathy.  Neurological: She is alert and oriented to person, place, and time. Coordination normal.  Skin: Skin is warm and dry. No rash noted.  Psychiatric: She has a normal mood and affect. Her behavior is normal.    Lab Results  Component Value Date   WBC 8.8 02/29/2012   HGB 9.4* 03/15/2012   HCT 27.2* 03/15/2012   PLT 312 02/29/2012   GLUCOSE 111* 03/15/2012   ALT 36* 02/29/2012   AST 32 02/29/2012   NA 136 03/15/2012   K 3.2* 03/15/2012   CL 100 03/15/2012   CREATININE 0.71 03/15/2012   BUN 14 03/15/2012   CO2 27 03/15/2012   INR 0.93 02/29/2012      .  Assessment & Plan:   [name] was seen today for cough.  Diagnoses and all orders for this visit:  Bronchitis with bronchospasm Orders: -     albuterol (PROVENTIL) (2.5 MG/3ML) 0.083% nebulizer solution 5 mg; Take 6 mLs (5 mg total) by nebulization once. -     ipratropium (ATROVENT) nebulizer solution 0.5 mg; Take 2.5 mLs (0.5 mg total) by nebulization once.  Other orders -     Fluticasone-Salmeterol (ADVAIR DISKUS) 250-50 MCG/DOSE AEPB; Inhale 1 puff into the lungs 2 (two) times daily. -      chlorpheniramine-HYDROcodone (TUSSIONEX PENNKINETIC ER) 10-8 MG/5ML SUER; Take 5 mLs by mouth 2 (two) times daily. -     clarithromycin (BIAXIN) 500 MG tablet; Take 1 tablet (500 mg total) by mouth 2 (two) times daily.  I am having [name] start on Fluticasone-Salmeterol, chlorpheniramine-HYDROcodone, and clarithromycin. I am also having her maintain her atorvastatin, multivitamin with minerals, Calcium-Vitamin D, bisacodyl, acetaminophen, ibuprofen, fenofibrate, TRAMADOL HCL PO, olmesartan-hydrochlorothiazide, and albuterol. We administered albuterol and ipratropium.  Meds ordered this encounter  Medications  . albuterol (PROVENTIL) (2.5 MG/3ML) 0.083% nebulizer solution 5 mg    Sig:   . ipratropium (ATROVENT) nebulizer solution 0.5 mg    Sig:   . Fluticasone-Salmeterol (ADVAIR DISKUS) 250-50 MCG/DOSE AEPB    Sig: Inhale 1 puff  into the lungs 2 (two) times daily.    Dispense:  60 each    Refill:  12  . chlorpheniramine-HYDROcodone (TUSSIONEX PENNKINETIC ER) 10-8 MG/5ML SUER    Sig: Take 5 mLs by mouth 2 (two) times daily.    Dispense:  60 mL    Refill:  0  . clarithromycin (BIAXIN) 500 MG tablet    Sig: Take 1 tablet (500 mg total) by mouth 2 (two) times daily.    Dispense:  20 tablet    Refill:  0     Follow-up: Return if symptoms worsen or fail to improve.  Roselee Culver, MD

## 2015-06-06 ENCOUNTER — Other Ambulatory Visit: Payer: Self-pay

## 2015-06-06 DIAGNOSIS — Z1231 Encounter for screening mammogram for malignant neoplasm of breast: Secondary | ICD-10-CM

## 2015-07-03 ENCOUNTER — Emergency Department (HOSPITAL_BASED_OUTPATIENT_CLINIC_OR_DEPARTMENT_OTHER)
Admission: EM | Admit: 2015-07-03 | Discharge: 2015-07-03 | Disposition: A | Discharge status: Home / Self Care | Payer: Medicare Other | Attending: Emergency Medicine | Admitting: Emergency Medicine

## 2015-07-03 ENCOUNTER — Encounter (HOSPITAL_BASED_OUTPATIENT_CLINIC_OR_DEPARTMENT_OTHER): Payer: Self-pay | Admitting: *Deleted

## 2015-07-03 ENCOUNTER — Emergency Department (HOSPITAL_BASED_OUTPATIENT_CLINIC_OR_DEPARTMENT_OTHER): Payer: Medicare Other

## 2015-07-03 DIAGNOSIS — J40 Bronchitis, not specified as acute or chronic: Secondary | ICD-10-CM

## 2015-07-03 DIAGNOSIS — R05 Cough: Secondary | ICD-10-CM | POA: Diagnosis not present

## 2015-07-03 DIAGNOSIS — Z792 Long term (current) use of antibiotics: Secondary | ICD-10-CM | POA: Insufficient documentation

## 2015-07-03 DIAGNOSIS — M1990 Unspecified osteoarthritis, unspecified site: Secondary | ICD-10-CM | POA: Insufficient documentation

## 2015-07-03 DIAGNOSIS — Z8719 Personal history of other diseases of the digestive system: Secondary | ICD-10-CM | POA: Diagnosis not present

## 2015-07-03 DIAGNOSIS — E785 Hyperlipidemia, unspecified: Secondary | ICD-10-CM | POA: Insufficient documentation

## 2015-07-03 DIAGNOSIS — Z79899 Other long term (current) drug therapy: Secondary | ICD-10-CM | POA: Insufficient documentation

## 2015-07-03 DIAGNOSIS — I10 Essential (primary) hypertension: Secondary | ICD-10-CM | POA: Insufficient documentation

## 2015-07-03 DIAGNOSIS — J209 Acute bronchitis, unspecified: Secondary | ICD-10-CM | POA: Diagnosis not present

## 2015-07-03 MED ORDER — BENZONATATE 200 MG PO CAPS
200.0000 mg | ORAL_CAPSULE | Freq: Three times a day (TID) | ORAL | Status: DC | PRN
Start: 2015-07-03 — End: 2015-11-28

## 2015-07-03 MED ORDER — DOXYCYCLINE HYCLATE 100 MG PO CAPS: 100.0000 mg | ORAL_CAPSULE | Freq: Two times a day (BID) | ORAL | Status: DC

## 2015-07-03 MED ORDER — IPRATROPIUM-ALBUTEROL 0.5-2.5 (3) MG/3ML IN SOLN
3.0000 mL | RESPIRATORY_TRACT | Status: DC
  Administered 2015-07-03: 3 mL via RESPIRATORY_TRACT
  Filled 2015-07-03: qty 3

## 2015-07-03 MED ORDER — DEXAMETHASONE 4 MG PO TABS
12.0000 mg | ORAL_TABLET | Freq: Once | ORAL | Status: AC
  Administered 2015-07-03: 12 mg via ORAL
  Filled 2015-07-03: qty 3

## 2015-07-03 NOTE — ED Provider Notes (Signed)
CSN: [id number]     Arrival date & time 07/03/15  1559 History  This chart was scribed for Evelina Bucy, MD by Julien Nordmann, ED Scribe. This patient was seen in room MH10/MH10 and the patient's care was started at 4:26 PM.      Chief Complaint  Patient presents with  . Cough     Patient is a 69 y.o. female presenting with cough. The history is provided by the patient. No language interpreter was used.  Cough Cough characteristics:  Productive Sputum characteristics:  White and yellow Severity:  Moderate Onset quality:  Gradual Duration:  4 days Timing:  Constant Progression:  Worsening Chronicity:  New Smoker: no   Relieved by:  Nothing Ineffective treatments:  Decongestant Associated symptoms: wheezing   Associated symptoms: no fever    HPI Comments: [name] is a 70 y.o. female who presents to the Emergency Department complaining of a constant, gradual worsening productive cough onset 4 days ago. Pt has been coughing up white sputum that is occasionally yellow at times. She has been taking Mucinex and Nyquil to alleviate the cough with no relief. Pt denies fever, blood in sputum, nausea, vomiting, and diarrhea. Pt is a non smoker.  Past Medical History  Diagnosis Date  . Hypertension   . Peripheral vascular disease     has spider veins  . GERD (gastroesophageal reflux disease)     at times  . Arthritis   . Hyperlipidemia    Past Surgical History  Procedure Laterality Date  . 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 Mastectomy  2008    right breast  . Joint replacement  2009    right knee  . Joint replacement  2011    left knee  . Shoulder arthroscopy distal clavicle excision and open rotator cuff repair  2011  . Abdominal hysterectomy  1982  . Breast surgery    . Total shoulder arthroplasty  03/14/2012    Procedure: TOTAL SHOULDER ARTHROPLASTY;  Surgeon: Alta Corning, MD;  Location: [facility];  Service: Orthopedics;  Laterality: Left;   Family History   Problem Relation Age of Onset  . Anesthesia problems Neg Hx   . Hypotension Neg Hx   . Malignant hyperthermia Neg Hx   . Pseudochol deficiency Neg Hx    Social History  Substance Use Topics  . Smoking status: Never Smoker   . Smokeless tobacco: Never Used  . Alcohol Use: No   OB History    No data available     Review of Systems  Constitutional: Negative for fever.  Respiratory: Positive for cough and wheezing.   All other systems reviewed and are negative.     Allergies  Dilaudid and Morphine and related  Home Medications   Prior to Admission medications   Medication Sig Start Date End Date Taking? Authorizing Provider  acetaminophen (TYLENOL) 325 MG tablet Take 650 mg by mouth every 6 (six) hours as needed.   Yes Historical Provider, MD  atorvastatin (LIPITOR) 20 MG tablet Take 20 mg by mouth daily.   Yes Historical Provider, MD  fenofibrate (TRICOR) 145 MG tablet Take 145 mg by mouth daily.   Yes Historical Provider, MD  Fluticasone-Salmeterol (ADVAIR DISKUS) 250-50 MCG/DOSE AEPB Inhale 1 puff into the lungs 2 (two) times daily. 03/12/15  Yes Roselee Culver, MD  ibuprofen (ADVIL,MOTRIN) 100 MG tablet Take 100 mg by mouth every 6 (six) hours as needed.   Yes Historical Provider, MD  olmesartan-hydrochlorothiazide (BENICAR HCT) 20-12.5 MG per tablet Take 1 tablet by mouth daily.   Yes Historical Provider, MD  TRAMADOL HCL PO Take by mouth.   Yes Historical Provider, MD  albuterol (PROVENTIL HFA;VENTOLIN HFA) 108 (90 BASE) MCG/ACT inhaler Inhale 2 puffs into the lungs every 6 (six) hours as needed for wheezing or shortness of breath (cough, shortness of breath or wheezing.). 10/16/14   Wardell Honour, MD  bisacodyl (DULCOLAX) 5 MG EC tablet Take 5-10 mg by mouth daily. For constipation    Historical Provider, MD  Calcium Carbonate-Vitamin D (CALCIUM-VITAMIN D) 600-200 MG-UNIT CAPS Take 1 capsule by mouth daily.    Historical Provider, MD  chlorpheniramine-HYDROcodone  (TUSSIONEX PENNKINETIC ER) 10-8 MG/5ML SUER Take 5 mLs by mouth 2 (two) times daily. 03/12/15   Roselee Culver, MD  clarithromycin (BIAXIN) 500 MG tablet Take 1 tablet (500 mg total) by mouth 2 (two) times daily. 03/12/15   Roselee Culver, MD  Multiple Vitamin (MULITIVITAMIN WITH MINERALS) TABS Take 1 tablet by mouth daily.    Historical Provider, MD   Triage vitals: BP 161/77 mmHg  Pulse 88  Temp(Src) 98.9 F (37.2 C) (Oral)  Resp 22  Ht 5\' 4"  (1.626 m)  Wt 170 lb (77.111 kg)  BMI 29.17 kg/m2  SpO2 95% Physical Exam  Constitutional: She is oriented to person, place, and time. She appears well-developed and well-nourished. No distress.  HENT:  Head: Normocephalic and atraumatic.  Mouth/Throat: Oropharynx is clear and moist.  Eyes: EOM are normal. Pupils are equal, round, and reactive to light.  Neck: Normal range of motion. Neck supple.  Cardiovascular: Normal rate and regular rhythm.  Exam reveals no friction rub.   No murmur heard. Pulmonary/Chest: Effort normal. No respiratory distress. She has wheezes (mild, diffuse). She has rhonchi (mild) in the left middle field. She has no rales.  Abdominal: Soft. She exhibits no distension. There is no tenderness. There is no rebound.  Musculoskeletal: Normal range of motion. She exhibits no edema.  Neurological: She is alert and oriented to person, place, and time.  Skin: She is not diaphoretic.  Nursing note and vitals reviewed.   ED Course  Procedures  DIAGNOSTIC STUDIES: Oxygen Saturation is 95% on RA, low by my interpretation.  COORDINATION OF CARE:  4:29 PM Discussed treatment plan which includes x-ray and breathing treatment with pt at bedside and pt agreed to plan.  Labs Review Labs Reviewed - No data to display  Imaging Review Dg Chest 2 View  07/03/2015   CLINICAL DATA:  Productive cough starting 4 days ago  EXAM: CHEST  2 VIEW  COMPARISON:  12/03/2014  FINDINGS: Cardiomediastinal silhouette is stable. Old right rib  fractures again noted. Central mild bronchitic changes. No acute infiltrate or pulmonary edema. Left shoulder prosthesis.  IMPRESSION: No acute infiltrate or pulmonary edema.  Central bronchitic changes.   Electronically Signed   By: Lahoma Crocker M.D.   On: 07/03/2015 17:18   I have personally reviewed and evaluated these images and lab results as part of my medical decision-making.   EKG Interpretation None      MDM   Final diagnoses:  Bronchitis    69 year old female with history of some lung disease on inhalers presents with cough. Present for the past week, but worse over the past 2 days. No fever, vomiting, chest pain that is new. Here she is relaxing topically, stable vitals, no respiratory distress. She has diffuse wheezing with mild rhonchi in the left middle field.  Check chest x-ray, give steroids, give breathing treatment. CXR shows some bronchitic changes, no pneumonia. Will give doxycycline and instruct to f/u with PCP.  I personally performed the services described in this documentation, which was scribed in my presence. The recorded information has been reviewed and is accurate.     Evelina Bucy, MD 07/03/15 [phone]

## 2015-07-03 NOTE — ED Notes (Signed)
Pt reports she developed a productive cough on Wednesday that has worsened since Friday. Sputum is white, yellow at times. Reports taking Mucinex and Nyquil with no relief. Denies fever, n/v/d. Reports past hx of bronchitis and pneumonia.

## 2015-07-13 ENCOUNTER — Ambulatory Visit: Payer: Medicare Other

## 2015-07-14 ENCOUNTER — Other Ambulatory Visit: Payer: Self-pay

## 2015-07-14 ENCOUNTER — Ambulatory Visit
Admission: RE | Admit: 2015-07-14 | Discharge: 2015-07-14 | Disposition: A | Discharge status: Home / Self Care | Payer: Medicare Other | Source: Ambulatory Visit

## 2015-07-14 DIAGNOSIS — Z1231 Encounter for screening mammogram for malignant neoplasm of breast: Secondary | ICD-10-CM | POA: Diagnosis not present

## 2015-08-15 DIAGNOSIS — Z23 Encounter for immunization: Secondary | ICD-10-CM | POA: Diagnosis not present

## 2015-11-01 DIAGNOSIS — M25512 Pain in left shoulder: Secondary | ICD-10-CM | POA: Diagnosis not present

## 2015-11-28 ENCOUNTER — Ambulatory Visit (INDEPENDENT_AMBULATORY_CARE_PROVIDER_SITE_OTHER): Payer: Medicare Other | Admitting: Physician Assistant

## 2015-11-28 VITALS — BP 127/78 | HR 103 | Temp 98.0°F | Resp 17 | Ht 64.5 in | Wt 177.0 lb

## 2015-11-28 DIAGNOSIS — J209 Acute bronchitis, unspecified: Secondary | ICD-10-CM

## 2015-11-28 DIAGNOSIS — J40 Bronchitis, not specified as acute or chronic: Secondary | ICD-10-CM | POA: Diagnosis not present

## 2015-11-28 MED ORDER — GUAIFENESIN ER 1200 MG PO TB12: 1.0000 | ORAL_TABLET | Freq: Two times a day (BID) | ORAL | Status: DC | PRN

## 2015-11-28 MED ORDER — AZITHROMYCIN 500 MG PO TABS: 500.0000 mg | ORAL_TABLET | Freq: Every day | ORAL | Status: AC

## 2015-11-28 MED ORDER — ALBUTEROL SULFATE HFA 108 (90 BASE) MCG/ACT IN AERS: 2.0000 | INHALATION_SPRAY | Freq: Four times a day (QID) | RESPIRATORY_TRACT | Status: DC | PRN

## 2015-11-28 MED ORDER — BENZONATATE 100 MG PO CAPS: 100.0000 mg | ORAL_CAPSULE | Freq: Three times a day (TID) | ORAL | Status: DC | PRN

## 2015-11-28 NOTE — Progress Notes (Signed)
Subjective:    Patient ID: [name], female    DOB: 24-Jan-1946, 70 y.o.   MRN: [id number]  HPI  Ms. Barfield is a 70 year old Caucasian female who presents today with a 6 day history of cough. She states the cough started a few days after her ear canals began to "itch". The cough is productive of clear to yellow tinged phlegm. She states the cough has progressively gotten worse resulting in sore ribs and a "scratchy" throat. She states she has been diagnosed with pneumonia and bronchitis many times since she retired from the [employer] where she reports she was exposed to multiple chemicals. She denies any history of smoking or tobacco use. She feels that her current symptoms are similar to those episodes. She complains of chronic wheezing, chest tightness, SOB, and DOE but states they are not significantly worse than normal at this time. She does report increased fatigue associated with her cough. Patient reports her grandaughter is due to give birth this week and is anxious that her current illness will prevent her from being present at the birth if it continues or gets worse.   Allergies  Allergen Reactions  . Dilaudid [Hydromorphone Hcl] Itching  . Morphine And Related Nausea And Vomiting   Prior to Admission medications   Medication Sig Start Date End Date Taking? Authorizing Provider  acetaminophen (TYLENOL) 325 MG tablet Take 650 mg by mouth every 6 (six) hours as needed.   Yes Historical Provider, MD  atorvastatin (LIPITOR) 20 MG tablet Take 20 mg by mouth daily.   Yes Historical Provider, MD  Calcium Carbonate-Vitamin D (CALCIUM-VITAMIN D) 600-200 MG-UNIT CAPS Take 1 capsule by mouth daily.   Yes Historical Provider, MD  fenofibrate (TRICOR) 145 MG tablet Take 145 mg by mouth daily.   Yes Historical Provider, MD  ibuprofen (ADVIL,MOTRIN) 100 MG tablet Take 100 mg by mouth every 6 (six) hours as needed.   Yes Historical Provider, MD  Multiple Vitamin (MULITIVITAMIN  WITH MINERALS) TABS Take 1 tablet by mouth daily.   Yes Historical Provider, MD  olmesartan-hydrochlorothiazide (BENICAR HCT) 20-12.5 MG per tablet Take 1 tablet by mouth daily.   Yes Historical Provider, MD  TRAMADOL HCL PO Take by mouth. Reported on 11/28/2015    Historical Provider, MD   PMH, [place], FH were all reviewed with patient and updated as needed.  Review of Systems  Constitutional: Positive for fatigue. Negative for fever and chills.  HENT: Positive for sore throat (Scratchy, ache sensation). Negative for congestion, hearing loss, sinus pressure and sneezing. Tinnitus: Itchy sensation.   Eyes: Negative for visual disturbance.  Respiratory: Positive for cough, chest tightness, shortness of breath (Chronic) and wheezing (Chronic).   Cardiovascular: Negative for chest pain, palpitations and leg swelling.  Gastrointestinal: Negative for nausea, vomiting, abdominal pain, diarrhea and constipation.  Genitourinary: Negative for dysuria.  Musculoskeletal: Negative for myalgias.       Objective:   Physical Exam  Constitutional: She appears well-developed and well-nourished. No distress.  HENT:  Head: Normocephalic and atraumatic.  Right Ear: Tympanic membrane, external ear and ear canal normal.  Left Ear: Tympanic membrane, external ear and ear canal normal.  Nose: Mucosal edema present.  Mouth/Throat: Uvula is midline. No oropharyngeal exudate, posterior oropharyngeal edema or posterior oropharyngeal erythema.  Eyes: Conjunctivae and EOM are normal. Pupils are equal, round, and reactive to light.  Neck: Neck supple.  Cardiovascular: Normal rate, regular rhythm, S1 normal and S2 normal.  Exam reveals no gallop  and no friction rub.   No murmur heard. Pulmonary/Chest: She has wheezes (Diffuse).  Abdominal: Soft. Bowel sounds are normal. She exhibits no distension. There is no tenderness.  Skin: Skin is warm and dry.  Psychiatric: She has a normal mood and affect. Her behavior is  normal.   Filed Vitals:   11/28/15 1456  BP: 127/78  Pulse: 103  Temp: 98 F (36.7 C)  Resp: 17       Assessment & Plan:   1. Bronchitis with bronchospasm  Ms. Ostrand's complaints of persistent productive cough, SOB, and DOE, as well as diffuse wheezing on physical exam all support the diagnosis of bronchitis. We prescribed azithromycin 500 mg tab qd for the infection. She was also given prn guaifenesin 1200mg  tablets to help with excess mucus production and and prn benzonatate 100mg  to help with her cough. She reports not using advair which she was prescribed on her last visit due to being uncomfortable with using the disc as well as concerned with adverse drug reactions she read about. We discussed proper use of the advair disc, discussed her concerns of adverse reactions, and encouraged her to use it every day to help with her chronic symptoms and possibly decrease acute issues. Her albuterol inhaler was refilled and she was instructed to use it when necessary. We also encouraged her to get adequate sleep and drink plenty of water.   Her medical record indicates that she was diagnosed and treated for bronchitis in September and May of 2016, as well as a lower respiratory infection in Jan 2016. Patient reports an appointment on Friday, 12/02/2015, with her new primary care provider. She was instructed to provide them with her paperwork from today and to further discuss her recurring pulmonary problems in an effort to find a better long term solution.  We discussed the plan with the patient and instructed her to return if symptoms persist or worsen. She seemed pleased and indicated her understanding of the directions.   - azithromycin (ZITHROMAX) 500 MG tablet; Take 1 tablet (500 mg total) by mouth daily.  Dispense: 3 tablet; Refill: 0 - Guaifenesin (MUCINEX MAXIMUM STRENGTH) 1200 MG TB12; Take 1 tablet (1,200 mg total) by mouth every 12 (twelve) hours as needed.  Dispense: 14 tablet; Refill:  1 - benzonatate (TESSALON) 100 MG capsule; Take 1-2 capsules (100-200 mg total) by mouth 3 (three) times daily as needed for cough.  Dispense: 40 capsule; Refill: 0 - albuterol (PROVENTIL HFA;VENTOLIN HFA) 108 (90 Base) MCG/ACT inhaler; Inhale 2 puffs into the lungs every 6 (six) hours as needed for wheezing or shortness of breath (cough, shortness of breath or wheezing.).  Dispense: 1 Inhaler; Refill: 1  S. Cyril Mourning, [place]

## 2015-11-28 NOTE — Progress Notes (Signed)
Patient ID: [name], female    DOB: 03/12/1946, 70 y.o.   MRN: [id number]  PCP: Lanette Hampshire, MD recently retired. Establishes with new PCP later this week.  Subjective:   Chief Complaint  Patient presents with  . Cough  . URI    HPI Presents for evaluation of a 6 day history of cough.   She states the cough started a few days after her ear canals began to "itch". The cough is productive of clear to yellow tinged phlegm. She states the cough has progressively gotten worse resulting in sore ribs and a "scratchy" throat.   She states she has been diagnosed with pneumonia and bronchitis many times since she retired from the [employer] where she reports she was exposed to multiple chemicals. She denies any history of smoking or tobacco use. She feels that her current symptoms are similar to those episodes.   She complains of chronic wheezing, chest tightness, SOB, and DOE but states they are not significantly worse than normal at this time. She does report increased fatigue associated with her cough.   Patient reports her grandaughter is due to give birth this week and is anxious that her current illness will prevent her from being present at the birth if it continues or gets worse.   Review of Systems Constitutional: Positive for fatigue. Negative for fever and chills.  HENT: Positive for sore throat (Scratchy, ache sensation). Negative for congestion, hearing loss, sinus pressure and sneezing. Tinnitus: Itchy sensation.  Eyes: Negative for visual disturbance.  Respiratory: Positive for cough, chest tightness, shortness of breath (Chronic) and wheezing (Chronic).  Cardiovascular: Negative for chest pain, palpitations and leg swelling.  Gastrointestinal: Negative for nausea, vomiting, abdominal pain, diarrhea and constipation.  Genitourinary: Negative for dysuria.  Musculoskeletal: Negative for myalgias.     Patient Active Problem List   Diagnosis Date  Noted  . Osteoarthritis of left shoulder 03/24/2012     Prior to Admission medications   Medication Sig Start Date End Date Taking? Authorizing Provider  acetaminophen (TYLENOL) 325 MG tablet Take 650 mg by mouth every 6 (six) hours as needed.   Yes Historical Provider, MD  atorvastatin (LIPITOR) 20 MG tablet Take 20 mg by mouth daily.   Yes Historical Provider, MD  Calcium Carbonate-Vitamin D (CALCIUM-VITAMIN D) 600-200 MG-UNIT CAPS Take 1 capsule by mouth daily.   Yes Historical Provider, MD  fenofibrate (TRICOR) 145 MG tablet Take 145 mg by mouth daily.   Yes Historical Provider, MD  ibuprofen (ADVIL,MOTRIN) 100 MG tablet Take 100 mg by mouth every 6 (six) hours as needed.   Yes Historical Provider, MD  Multiple Vitamin (MULITIVITAMIN WITH MINERALS) TABS Take 1 tablet by mouth daily.   Yes Historical Provider, MD  olmesartan-hydrochlorothiazide (BENICAR HCT) 20-12.5 MG per tablet Take 1 tablet by mouth daily.   Yes Historical Provider, MD  TRAMADOL HCL PO Take by mouth. Reported on 11/28/2015    Historical Provider, MD     Allergies  Allergen Reactions  . Dilaudid [Hydromorphone Hcl] Itching  . Morphine And Related Nausea And Vomiting       Objective:  Physical Exam  Constitutional: She is oriented to person, place, and time. She appears well-developed and well-nourished. She is active and cooperative. No distress.  BP 127/78 mmHg  Pulse 103  Temp(Src) 98 F (36.7 C) (Oral)  Resp 17  Ht 5' 4.5" (1.638 m)  Wt 177 lb (80.287 kg)  BMI 29.92 kg/m2  SpO2 96%  HENT:  Head: Normocephalic and atraumatic.  Right Ear: Hearing, tympanic membrane, external ear and ear canal normal.  Left Ear: Hearing, tympanic membrane, external ear and ear canal normal.  Nose: Mucosal edema (mild) present.  No foreign bodies. Right sinus exhibits no maxillary sinus tenderness and no frontal sinus tenderness. Left sinus exhibits no maxillary sinus tenderness and no frontal sinus tenderness.    Mouth/Throat: Uvula is midline, oropharynx is clear and moist and mucous membranes are normal. No uvula swelling. No oropharyngeal exudate.  Eyes: Conjunctivae and EOM are normal. Pupils are equal, round, and reactive to light. Right eye exhibits no discharge. Left eye exhibits no discharge. No scleral icterus.  Neck: Trachea normal, normal range of motion and full passive range of motion without pain. Neck supple. No thyroid mass and no thyromegaly present.  Cardiovascular: Normal rate, regular rhythm and normal heart sounds.   Pulmonary/Chest: Effort normal. She has no decreased breath sounds. She has wheezes (diffuse, high-pitched). She has no rhonchi. She has no rales.  Lymphadenopathy:       Head (right side): No submandibular, no tonsillar, no preauricular, no posterior auricular and no occipital adenopathy present.       Head (left side): No submandibular, no tonsillar, no preauricular and no occipital adenopathy present.    She has no cervical adenopathy.       Right: No supraclavicular adenopathy present.       Left: No supraclavicular adenopathy present.  Neurological: She is alert and oriented to person, place, and time. She has normal strength. No cranial nerve deficit or sensory deficit.  Skin: Skin is warm, dry and intact. No rash noted.  Psychiatric: She has a normal mood and affect. Her speech is normal and behavior is normal.           Assessment & Plan:   1. Bronchitis with bronchospasm Encouraged her to use the Advair daily as maintenance, rather than rescue, and use the albuterol PRN as rescue. She likely needs additional pulmonary evaluation once the acute illness is resolved, as her chronic symptoms have never been addressed. She will pursue that with her new PCP. Supportive care. Anticipatory guidance. - azithromycin (ZITHROMAX) 500 MG tablet; Take 1 tablet (500 mg total) by mouth daily.  Dispense: 3 tablet; Refill: 0 - Guaifenesin (MUCINEX MAXIMUM STRENGTH) 1200 MG  TB12; Take 1 tablet (1,200 mg total) by mouth every 12 (twelve) hours as needed.  Dispense: 14 tablet; Refill: 1 - benzonatate (TESSALON) 100 MG capsule; Take 1-2 capsules (100-200 mg total) by mouth 3 (three) times daily as needed for cough.  Dispense: 40 capsule; Refill: 0 - albuterol (PROVENTIL HFA;VENTOLIN HFA) 108 (90 Base) MCG/ACT inhaler; Inhale 2 puffs into the lungs every 6 (six) hours as needed for wheezing or shortness of breath (cough, shortness of breath or wheezing.).  Dispense: 1 Inhaler; Refill: 1   Fara Chute, PA-C Physician Assistant-Certified [facility]

## 2015-11-28 NOTE — Patient Instructions (Signed)
Restart the Advair, one puff twice each day. Rise your mouth or brush your teeth after using it.  Use the albuterol if you need it, for cough, shortness of breath, wheezing or tightness.

## 2015-12-02 DIAGNOSIS — E782 Mixed hyperlipidemia: Secondary | ICD-10-CM | POA: Diagnosis not present

## 2015-12-02 DIAGNOSIS — R7309 Other abnormal glucose: Secondary | ICD-10-CM | POA: Diagnosis not present

## 2015-12-02 DIAGNOSIS — R6889 Other general symptoms and signs: Secondary | ICD-10-CM | POA: Diagnosis not present

## 2015-12-02 DIAGNOSIS — R946 Abnormal results of thyroid function studies: Secondary | ICD-10-CM | POA: Diagnosis not present

## 2016-03-06 ENCOUNTER — Ambulatory Visit (INDEPENDENT_AMBULATORY_CARE_PROVIDER_SITE_OTHER): Payer: Medicare Other | Admitting: Family Medicine

## 2016-03-06 ENCOUNTER — Ambulatory Visit (INDEPENDENT_AMBULATORY_CARE_PROVIDER_SITE_OTHER): Payer: Medicare Other

## 2016-03-06 VITALS — BP 136/70 | HR 90 | Temp 98.7°F | Resp 20 | Ht 63.0 in | Wt 182.2 lb

## 2016-03-06 DIAGNOSIS — Z131 Encounter for screening for diabetes mellitus: Secondary | ICD-10-CM

## 2016-03-06 DIAGNOSIS — J45909 Unspecified asthma, uncomplicated: Secondary | ICD-10-CM | POA: Diagnosis not present

## 2016-03-06 DIAGNOSIS — R05 Cough: Secondary | ICD-10-CM

## 2016-03-06 DIAGNOSIS — R062 Wheezing: Secondary | ICD-10-CM

## 2016-03-06 DIAGNOSIS — Z79899 Other long term (current) drug therapy: Secondary | ICD-10-CM | POA: Diagnosis not present

## 2016-03-06 DIAGNOSIS — R0609 Other forms of dyspnea: Secondary | ICD-10-CM

## 2016-03-06 DIAGNOSIS — R0600 Dyspnea, unspecified: Secondary | ICD-10-CM

## 2016-03-06 DIAGNOSIS — I10 Essential (primary) hypertension: Secondary | ICD-10-CM

## 2016-03-06 DIAGNOSIS — R059 Cough, unspecified: Secondary | ICD-10-CM

## 2016-03-06 LAB — POCT CBC
Granulocyte percent: 64.5 %G (ref 37–80)
HCT, POC: 34.1 % — AB (ref 37.7–47.9)
Hemoglobin: 12.2 g/dL (ref 12.2–16.2)
Lymph, poc: 1.8 (ref 0.6–3.4)
MCH, POC: 33.5 pg — AB (ref 27–31.2)
MCHC: 35.7 g/dL — AB (ref 31.8–35.4)
MCV: 93.8 fL (ref 80–97)
MID (cbc): 0.7 (ref 0–0.9)
MPV: 7 fL (ref 0–99.8)
POC Granulocyte: 4.5 (ref 2–6.9)
POC LYMPH PERCENT: 25.7 %L (ref 10–50)
POC MID %: 9.8 %M (ref 0–12)
Platelet Count, POC: 287 10*3/uL (ref 142–424)
RBC: 3.63 M/uL — AB (ref 4.04–5.48)
RDW, POC: 12.3 %
WBC: 6.9 10*3/uL (ref 4.6–10.2)

## 2016-03-06 LAB — GLUCOSE, POCT (MANUAL RESULT ENTRY): POC Glucose: 103 mg/dl — AB (ref 70–99)

## 2016-03-06 MED ORDER — FLUTICASONE-SALMETEROL 115-21 MCG/ACT IN AERO: 2.0000 | INHALATION_SPRAY | Freq: Two times a day (BID) | RESPIRATORY_TRACT | Status: DC

## 2016-03-06 MED ORDER — IPRATROPIUM BROMIDE 0.02 % IN SOLN
0.5000 mg | Freq: Once | RESPIRATORY_TRACT | Status: AC
  Administered 2016-03-06: 0.5 mg via RESPIRATORY_TRACT

## 2016-03-06 MED ORDER — PREDNISONE 20 MG PO TABS: 40.0000 mg | ORAL_TABLET | Freq: Every day | ORAL | Status: DC

## 2016-03-06 MED ORDER — AZITHROMYCIN 250 MG PO TABS: ORAL_TABLET | ORAL | Status: DC

## 2016-03-06 MED ORDER — ALBUTEROL SULFATE (2.5 MG/3ML) 0.083% IN NEBU
2.5000 mg | INHALATION_SOLUTION | Freq: Once | RESPIRATORY_TRACT | Status: AC
  Administered 2016-03-06: 2.5 mg via RESPIRATORY_TRACT

## 2016-03-06 MED ORDER — ALBUTEROL SULFATE HFA 108 (90 BASE) MCG/ACT IN AERS: 1.0000 | INHALATION_SPRAY | RESPIRATORY_TRACT | Status: DC | PRN

## 2016-03-06 NOTE — Progress Notes (Addendum)
By signing my name below, I, [name], attest that this documentation has been prepared under the direction and in the presence of Adrienne Ray, MD.  Electronically Signed: Verlee Monte, Medical Scribe. 03/06/2016. 1:59 PM. Subjective:    Patient ID: [name], female    DOB: 26-Oct-1945, 70 y.o.   MRN: [id number]  HPI Chief Complaint  Patient presents with  . Cough    wheezing x 4 days  . Nasal Congestion  . Shortness of Breath  . Medication Refill    albuterol inhaler    HPI Comments: [name] is a 70 y.o. female with a PMHx of HTN, and hyperlipidemia who presents to the [facility] complaining of nasal congestion, and rhinorrhea 3 days ago. She was last seen Feb 13th with bronchitis and bronchospasm and was treated with anthramycin and musinex. She was previously prescribed advair but she had not been using that and refilled albuterol. She was advised to discuss her chronic bronchitis with her new PCP -  she did see a doctor in [place] twice but plans on setting up a PCP. Pt reports she's had asthma, SOB, and wheezing since 2007 after she went to the hospital for bronchitis. Pt reports that her symptoms hasn't stopped her from "being on the go" frequently and still wheezes even when she's not sick. Pt mentions she may have had some long term exposure to chemicals from her job. Pt states she was prescribed advair but doesn't use it because she doesn't know how to use it. Pt was given a breathing treatment in the past but she couldn't tell the difference. Pt has used albuterol TID since onset past 2 days. Pt denies taking any daily medication for her symptoms. Pt denies heartburn, fever, chest pain, or Hx of heart disease. Pt states she hasn't has a PMHx of asthma until 2007.  Patient Active Problem List   Diagnosis Date Noted  . Osteoarthritis of left shoulder 03/24/2012   Past Medical History  Diagnosis Date  . Hypertension   . Peripheral  vascular disease ([place])     has spider veins  . GERD (gastroesophageal reflux disease)     at times  . Arthritis   . Hyperlipidemia   . Pneumothorax, traumatic     RIGHT; associated with multiple rib fractures after a fall   Past Surgical History  Procedure Laterality Date  . 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 Mastectomy  2008    right breast  . Joint replacement  2009    right knee  . Joint replacement  2011    left knee  . Shoulder arthroscopy distal clavicle excision and open rotator cuff repair  2011  . Abdominal hysterectomy  1982  . Breast surgery    . Total shoulder arthroplasty  03/14/2012    Procedure: TOTAL SHOULDER ARTHROPLASTY;  Surgeon: Alta Corning, MD;  Location: [facility];  Service: Orthopedics;  Laterality: Left;   Allergies  Allergen Reactions  . Dilaudid [Hydromorphone Hcl] Itching  . Morphine And Related Nausea And Vomiting   Prior to Admission medications   Medication Sig Start Date End Date Taking? Authorizing Provider  acetaminophen (TYLENOL) 325 MG tablet Take 650 mg by mouth every 6 (six) hours as needed.   Yes Historical Provider, MD  albuterol (PROVENTIL HFA;VENTOLIN HFA) 108 (90 Base) MCG/ACT inhaler Inhale 2 puffs into the lungs every 6 (six) hours as needed for wheezing or shortness of breath (cough, shortness  of breath or wheezing.). 11/28/15  Yes Chelle Jeffery, PA-C  atorvastatin (LIPITOR) 20 MG tablet Take 20 mg by mouth daily.   Yes Historical Provider, MD  fenofibrate (TRICOR) 145 MG tablet Take 145 mg by mouth daily.   Yes Historical Provider, MD  Guaifenesin (MUCINEX MAXIMUM STRENGTH) 1200 MG TB12 Take 1 tablet (1,200 mg total) by mouth every 12 (twelve) hours as needed. 11/28/15  Yes Chelle Jeffery, PA-C  ibuprofen (ADVIL,MOTRIN) 100 MG tablet Take 100 mg by mouth every 6 (six) hours as needed.   Yes Historical Provider, MD  olmesartan-hydrochlorothiazide (BENICAR HCT) 20-12.5 MG per tablet Take 1 tablet by mouth daily.   Yes Historical  Provider, MD  oxymetazoline (AFRIN) 0.05 % nasal spray Place 1 spray into both nostrils Nightly.   Yes Historical Provider, MD  Calcium Carbonate-Vitamin D (CALCIUM-VITAMIN D) 600-200 MG-UNIT CAPS Take 1 capsule by mouth daily. Reported on 03/06/2016    Historical Provider, MD  Multiple Vitamin (MULITIVITAMIN WITH MINERALS) TABS Take 1 tablet by mouth daily. Reported on 03/06/2016    Historical Provider, MD  TRAMADOL HCL PO Take by mouth. Reported on 03/06/2016    Historical Provider, MD   Social History   Social History  . Marital Status: Divorced    Spouse Name: N/A  . Number of Children: 2  . Years of Education: N/A   Occupational History  . Retired   . Machine Operator     [employer]   Social History Main Topics  . Smoking status: Never Smoker   . Smokeless tobacco: Never Used  . Alcohol Use: No  . Drug Use: No  . Sexual Activity: Not on file   Other Topics Concern  . Not on file   Social History Narrative   Review of Systems  Constitutional: Negative for fever.  HENT: Positive for congestion and rhinorrhea.   Respiratory: Positive for cough, shortness of breath and wheezing. Negative for chest tightness.   Cardiovascular: Negative for chest pain.   Objective:  BP 136/70 mmHg  Pulse 90  Temp(Src) 98.7 F (37.1 C) (Oral)  Resp 20  Ht 5\' 3"  (1.6 m)  Wt 182 lb 3.2 oz (82.645 kg)  BMI 32.28 kg/m2  SpO2 95%  Physical Exam  Constitutional: She is oriented to person, place, and time. She appears well-developed and well-nourished. No distress.  HENT:  Head: Normocephalic and atraumatic.  Right Ear: Hearing, external ear and ear canal normal. Tympanic membrane is not erythematous.  Left Ear: Hearing, external ear and ear canal normal. Tympanic membrane is not erythematous.  Nose: Nose normal.  Mouth/Throat: Oropharynx is clear and moist. No oropharyngeal exudate.  Minimal clear fluid behind TMs No erythema Sinuses nontender  Eyes: Conjunctivae and EOM are  normal. Pupils are equal, round, and reactive to light.  Neck:  No lymphadenopathy  Cardiovascular: Normal rate, regular rhythm, normal heart sounds and intact distal pulses.  Exam reveals no gallop and no friction rub.   No murmur heard. Pulmonary/Chest: Effort normal. No respiratory distress. She has wheezes. She has no rhonchi. She has no rales.  faint wheeze with coarse sound - right side greater than left  Musculoskeletal:  No edema in lower extremities  Neurological: She is alert and oriented to person, place, and time.  Skin: Skin is warm and dry. No rash noted.  Psychiatric: She has a normal mood and affect. Her behavior is normal.  Vitals reviewed.   Dg Chest 2 View  03/06/2016  CLINICAL DATA:  Cough, wheezing, dyspnea  on exertion. EXAM: CHEST  2 VIEW COMPARISON:  PA and lateral chest x-Zimmerman of July 03, 2015. FINDINGS: The lungs remain mildly hyperinflated. There is no focal infiltrate or pleural effusion. The interstitial markings are coarse though stable. The heart and pulmonary vascularity are normal. The mediastinum is normal in width. The bony thorax exhibits no acute abnormality. There surgical clips in the right hilar region. There is a prosthetic left shoulder. IMPRESSION: Chronic bronchitic changes, stable. There is no pneumonia, CHF, nor other acute cardiopulmonary abnormality. Electronically Signed   By: David  Martinique M.D.   On: 03/06/2016 15:03   EKG: Sinus rhythm low voltage - possible pulmonary disease. No apparent significant change from 2012.  3:09 PM Albuterol 2.5 mg and Atrovent 0.5 mg neb given. Decreased wheeze. Still faint coarse breath sounds - lower lobes.  Results for orders placed or performed in visit on 03/06/16  POCT CBC  Result Value Ref Range   WBC 6.9 4.6 - 10.2 K/uL   Lymph, poc 1.8 0.6 - 3.4   POC LYMPH PERCENT 25.7 10 - 50 %L   MID (cbc) 0.7 0 - 0.9   POC MID % 9.8 0 - 12 %M   POC Granulocyte 4.5 2 - 6.9   Granulocyte percent 64.5 37 - 80  %G   RBC 3.63 (A) 4.04 - 5.48 M/uL   Hemoglobin 12.2 12.2 - 16.2 g/dL   HCT, POC 34.1 (A) 37.7 - 47.9 %   MCV 93.8 80 - 97 fL   MCH, POC 33.5 (A) 27 - 31.2 pg   MCHC 35.7 (A) 31.8 - 35.4 g/dL   RDW, POC 12.3 %   Platelet Count, POC 287 142 - 424 K/uL   MPV 7.0 0 - 99.8 fL  POCT glucose (manual entry)  Result Value Ref Range   POC Glucose 103 (A) 70 - 99 mg/dl      Assessment & Plan:   KOBE BUCCIARELLI is a 70 y.o. female Wheezing, Asthmatic bronchitis, unspecified asthma severity, uncomplicated - Plan: DG Chest 2 View, albuterol (PROVENTIL) (2.5 MG/3ML) 0.083% nebulizer solution 2.5 mg, ipratropium (ATROVENT) nebulizer solution 0.5 mg, predniSONE (DELTASONE) 20 MG tablet, albuterol (PROVENTIL HFA;VENTOLIN HFA) 108 (90 Base) MCG/ACT inhaler, azithromycin (ZITHROMAX) 250 MG tablet, fluticasone-salmeterol (ADVAIR HFA) 115-21 MCG/ACT inhaler, Ambulatory referral to Pulmonology, DISCONTINUED: fluticasone-salmeterol (ADVAIR HFA) 115-21 MCG/ACT inhaler  - Suspected asthmatic bronchitis with some improvement with nebulizer treatments in office. No known history of asthma prior to age 52, possible environmental exposure versus chronic bronchitis.   -Will treat current flare with prednisone 40 mg daily 3 days, side effects discussed, albuterol up to every 4-6 hours as needed, and started Advair HFA for increased ease of use as difficulty with Discus.   -Refer to pulmonary for further evaluation of recurrent bronchitis, dyspnea on exertion, and wheezing.  - trial of Zantac over-the-counter temporarily to see if LPR component.  - Recheck in next 2-3 days if frequent albuterol need or not improving. RTC/ER precautions if worse.  Dyspnea on exertion - Plan: Brain natriuretic peptide, Ambulatory referral to Pulmonology Cough - Plan: POCT CBC, DG Chest 2 View, Ambulatory referral to Pulmonology  -Suspected pulmonary cause, but will check BNP. No concerning findings on EKG or chest x-Zimmerman. Refer to  pulmonary for further evaluation.  Essential hypertension - Plan: EKG 12-Lead, POCT glucose (manual entry)  Screening for diabetes mellitus - Plan: POCT glucose (manual entry)  High risk medication use - Plan: POCT glucose (manual entry)   Meds ordered this  encounter  Medications  . oxymetazoline (AFRIN) 0.05 % nasal spray    Sig: Place 1 spray into both nostrils Nightly.  Marland Kitchen albuterol (PROVENTIL) (2.5 MG/3ML) 0.083% nebulizer solution 2.5 mg    Sig:   . ipratropium (ATROVENT) nebulizer solution 0.5 mg    Sig:   . predniSONE (DELTASONE) 20 MG tablet    Sig: Take 2 tablets (40 mg total) by mouth daily with breakfast.    Dispense:  6 tablet    Refill:  0  . albuterol (PROVENTIL HFA;VENTOLIN HFA) 108 (90 Base) MCG/ACT inhaler    Sig: Inhale 1-2 puffs into the lungs every 4 (four) hours as needed for wheezing or shortness of breath.    Dispense:  1 Inhaler    Refill:  0  . DISCONTD: fluticasone-salmeterol (ADVAIR HFA) 115-21 MCG/ACT inhaler    Sig: Inhale 2 puffs into the lungs 2 (two) times daily.    Dispense:  1 Inhaler    Refill:  2  . azithromycin (ZITHROMAX) 250 MG tablet    Sig: Take 2 pills by mouth on day 1, then 1 pill by mouth per day on days 2 through 5.    Dispense:  6 tablet    Refill:  0  . fluticasone-salmeterol (ADVAIR HFA) 115-21 MCG/ACT inhaler    Sig: Inhale 2 puffs into the lungs 2 (two) times daily.    Dispense:  1 Inhaler    Refill:  1   Patient Instructions        IF you received an x-Zimmerman today, you will receive an invoice from [facility]. Please contact [facility] at [phone] with questions or concerns regarding your invoice.   IF you received labwork today, you will receive an invoice from [employer]. Please contact [facility] at [phone] with questions or concerns regarding your invoice.   Our billing staff will not be able to assist you with questions regarding bills from these companies.  You  will be contacted with the lab results as soon as they are available. The fastest way to get your results is to activate your My Chart account. Instructions are located on the last page of this paperwork. If you have not heard from [place] regarding the results in 2 weeks, please contact this office.    For current suspected asthmatic bronchitis, start prednisone, 2 pills per day for 3 days. Albuterol inhaler 1-2 puffs every 4-6 hours as needed for wheezing or shortness of breath. Start the new type of Advair 2 puffs twice per day. You can also try over-the-counter Zantac 150 mg twice per day in case there is some reflux cause of your wheezing and cough.  I will refer you to a pulmonologist for the recurrent bronchitis flares and your shortness of breath. If you're not improving in the next 3 days, return for recheck.  Return to the clinic or go to the nearest emergency room if any of your symptoms worsen or new symptoms occur.       I personally performed the services described in this documentation, which was scribed in my presence. The recorded information has been reviewed and considered, and addended by me as needed.

## 2016-03-06 NOTE — Patient Instructions (Addendum)
     IF you received an x-ray today, you will receive an invoice from [facility]. Please contact [facility] at [phone] with questions or concerns regarding your invoice.   IF you received labwork today, you will receive an invoice from [employer]. Please contact [facility] at [phone] with questions or concerns regarding your invoice.   Our billing staff will not be able to assist you with questions regarding bills from these companies.  You will be contacted with the lab results as soon as they are available. The fastest way to get your results is to activate your My Chart account. Instructions are located on the last page of this paperwork. If you have not heard from [place] regarding the results in 2 weeks, please contact this office.    For current suspected asthmatic bronchitis, start prednisone, 2 pills per day for 3 days. Albuterol inhaler 1-2 puffs every 4-6 hours as needed for wheezing or shortness of breath. Start the new type of Advair 2 puffs twice per day. You can also try over-the-counter Zantac 150 mg twice per day in case there is some reflux cause of your wheezing and cough.  I will refer you to a pulmonologist for the recurrent bronchitis flares and your shortness of breath. If you're not improving in the next 3 days, return for recheck.  Return to the clinic or go to the nearest emergency room if any of your symptoms worsen or new symptoms occur.

## 2016-03-07 LAB — BRAIN NATRIURETIC PEPTIDE: Brain Natriuretic Peptide: 73.8 pg/mL (ref <100)

## 2016-04-04 ENCOUNTER — Ambulatory Visit (INDEPENDENT_AMBULATORY_CARE_PROVIDER_SITE_OTHER): Payer: Medicare Other | Admitting: Internal Medicine

## 2016-04-04 ENCOUNTER — Encounter: Payer: Self-pay | Admitting: Internal Medicine

## 2016-04-04 VITALS — BP 120/80 | HR 92 | Ht 63.0 in | Wt 182.8 lb

## 2016-04-04 DIAGNOSIS — R058 Other specified cough: Secondary | ICD-10-CM

## 2016-04-04 DIAGNOSIS — R05 Cough: Secondary | ICD-10-CM

## 2016-04-04 DIAGNOSIS — J45991 Cough variant asthma: Secondary | ICD-10-CM

## 2016-04-04 LAB — NITRIC OXIDE: Nitric Oxide: 14

## 2016-04-04 MED ORDER — MOMETASONE FURO-FORMOTEROL FUM 100-5 MCG/ACT IN AERO: INHALATION_SPRAY | RESPIRATORY_TRACT | Status: DC

## 2016-04-04 NOTE — Assessment & Plan Note (Signed)
diffucult to tease out uacs from asthma here and could have components of both   Classic Upper airway cough syndrome, so named because it's frequently impossible to sort out how much is  CR/sinusitis with freq throat clearing (which can be related to primary GERD)   vs  causing  secondary (" extra esophageal")  GERD from wide swings in gastric pressure that occur with throat clearing, often  promoting self use of mint and menthol lozenges that reduce the lower esophageal sphincter tone and exacerbate the problem further in a cyclical fashion.   These are the same pts (now being labeled as having "irritable larynx syndrome" by some cough centers) who not infrequently have a history of having failed to tolerate ace inhibitors,  dry powder inhalers or biphosphonates or report having atypical reflux symptoms that don't respond to standard doses of PPI , and are easily confused as having aecopd or asthma flares by even experienced allergists/ pulmonologists.   rec max rx for gerd including ppi qam/ h2hs and diet for at least 4 weeks then regroup here

## 2016-04-04 NOTE — Assessment & Plan Note (Signed)
Spirometry 04/04/2016  FEV1 1.48 (68%)  Ratio 69 with fef 25-75= 45 - NO 04/04/2016  = 14   DDX of  difficult airways management almost all start with A and  include Adherence, Ace Inhibitors, Acid Reflux, Active Sinus Disease, Alpha 1 Antitripsin deficiency, Anxiety masquerading as Airways dz,  ABPA,  Allergy(esp in young), Aspiration (esp in elderly), Adverse effects of meds,  Active smokers, A bunch of PE's (a small clot burden can't cause this syndrome unless there is already severe underlying pulm or vascular dz with poor reserve) plus two Bs  = Bronchiectasis and Beta blocker use..and one C= CHF   Adherence is always the initial "prime suspect" and is a multilayered concern that requires a "trust but verify" approach in every patient - starting with knowing how to use medications, especially inhalers, correctly, keeping up with refills and understanding the fundamental difference between maintenance and prns vs those medications only taken for a very short course and then stopped and not refilled.  - very poor insight into meds in general but in particular into use of asthma meds - - The proper method of use, as well as anticipated side effects, of a metered-dose inhaler are discussed and demonstrated to the patient. Improved effectiveness after extensive coaching during this visit to a level of approximately 50 % from a baseline of 10 % > try dulera 100 2bid  ? Acid (or non-acid) GERD > always difficult to exclude as up to 75% of pts in some series report no assoc GI/ Heartburn symptoms> rec max (24h)  acid suppression and diet restrictions/ reviewed and instructions given in writing.   ? Allergic component > no classic assoc rhinitis symptoms though does report some seasonal variation > rec low dose ICS only for now and consider allergy eval next ov  Total time devoted to counseling  = 35/42m review case with pt/ discussion of options/alternatives/ personally creating written instructions  in  presence of pt  then going over those specific  Instructions directly with the pt including how to use all of the meds but in particular covering each new medication in detail and the difference between the maintenance/automatic meds and the prns using an action plan format for the latter.

## 2016-04-04 NOTE — Patient Instructions (Addendum)
Dulera 100 Take 2 puffs first thing in am and then another 2 puffs about 12 hours later - fill the prescription if it helps  Work on inhaler technique:  relax and gently blow all the way out then take a nice smooth deep breath back in, triggering the inhaler at same time you start breathing in.  Hold for up to 5 seconds if you can. Blow out thru nose. Brush your teeth with a baking soda based toothpaste after treatment and gargle with the toothpaste   Only use your albuterol as a rescue medication to be used if you can't catch your breath by resting or doing a relaxed purse lip breathing pattern.  - The less you use it, the better it will work when you need it. - Ok to use up to 2 puffs  every 4 hours if you must but call for immediate appointment if use goes up over your usual need - Don't leave home without it !!  (think of it like the spare tire for your car)   Try prilosec otc 20mg   Take 30-60 min before first meal of the day and Pepcid ac (famotidine) 20 mg one @  bedtime until cough return  GERD (REFLUX)  is an extremely common cause of respiratory symptoms just like yours , many times with no obvious heartburn at all.    It can be treated with medication, but also with lifestyle changes including elevation of the head of your bed (ideally with 6 inch  bed blocks),  Smoking cessation, avoidance of late meals, excessive alcohol, and avoid fatty foods, chocolate, peppermint, colas, red wine, and acidic juices such as orange juice.  NO MINT OR MENTHOL PRODUCTS SO NO COUGH DROPS  USE SUGARLESS CANDY INSTEAD (Jolley ranchers or Stover's or Life Savers) or even ice chips will also do - the key is to swallow to prevent all throat clearing. NO OIL BASED VITAMINS - use powdered substitutes.   Please schedule a follow up office visit in 4 weeks, sooner if needed

## 2016-04-04 NOTE — Progress Notes (Signed)
Subjective:     Patient ID: [name], female   DOB: [birth year],     MRN: [id number]  HPI  44 yowf never sign smoker dx as bronchopna age 70 but fine  thereafter until around 2010 and then developed  recurrent bouts of bronchitis and sob with exertion ever  since referred to pulmonary clinic 04/04/2016 by Dr Shea Stakes who rec rx with advair but pt never took it p reading the pi    04/04/2016 1st [facility] office visit/ [facility]   Chief Complaint  Patient presents with  . Pulmonary Consult    Referred by Dr. Merri Ray. Pt states since 2010 she has had bronchitis multiple times. She states that she usually has trouble in the fall, winter and spring. Today her cough is "okay, not bronchitis like" Cough is occ prod with minimal clear sputum. She also c/o wheezing. She gets SOB when she walks up and down stairs and when she tried to play with her grand children.   only time she uses any medication now is saba at hs  and with spells of "bronchitis" - thoroughly confused between maint and prns and concerned re use of maint due to side effects she read about   No obvious day to day or daytime variability or assoc excess/ purulent sputum or mucus plugs or hemoptysis or cp or chest tightness,   overt sinus or hb symptoms. No unusual exp hx or h/o childhood pna/ asthma or knowledge of premature birth.  Sleeping ok without nocturnal  or early am exacerbation  of respiratory  c/o's or need for noct saba. Also denies any obvious fluctuation of symptoms with weather or environmental changes or other aggravating or alleviating factors except as outlined above   Current Medications, Allergies, Complete Past Medical History, Past Surgical History, Family History, and Social History were reviewed in [employer] record.  ROS  The following are not active complaints unless bolded sore throat, dysphagia, dental problems, itching, sneezing,  nasal congestion or excess/ purulent  secretions, ear ache,   fever, chills, sweats, unintended wt loss, classically pleuritic or exertional cp,  orthopnea pnd or leg swelling, presyncope, palpitations, abdominal pain, anorexia, nausea, vomiting, diarrhea  or change in bowel or bladder habits, change in stools or urine, dysuria,hematuria,  rash, arthralgias, visual complaints, headache, numbness, weakness or ataxia or problems with walking or coordination,  change in mood/affect or memory.         Review of Systems     Objective:   Physical Exam  amb wf nad with frequent throat clearing   Wt Readings from Last 3 Encounters:  04/04/16 182 lb 12.8 oz (82.918 kg)  03/06/16 182 lb 3.2 oz (82.645 kg)  11/28/15 177 lb (80.287 kg)    Vital signs reviewed   HEENT: nl dentition, turbinates, and oropharynx. Nl external ear canals without cough reflex   NECK :  without JVD/Nodes/TM/ nl carotid upstrokes bilaterally   LUNGS: no acc muscle use,  Nl contour chest with end exp wheeze/cough  CV:  RRR  no s3 or murmur or increase in P2, no edema   ABD:  soft and nontender with nl inspiratory excursion in the supine position. No bruits or organomegaly, bowel sounds nl  MS:  Nl gait/ ext warm without deformities, calf tenderness, cyanosis or clubbing No obvious joint restrictions   SKIN: warm and dry without lesions    NEURO:  alert, approp, nl sensorium with  no motor deficits  I personally reviewed images and agree with radiology impression as follows:  CXR:  03/06/16  Chronic bronchitic changes, stable. There is no pneumonia, CHF, nor other acute cardiopulmonary abnormality      Assessment:

## 2016-04-06 ENCOUNTER — Institutional Professional Consult (permissible substitution): Payer: Medicare Other | Admitting: Internal Medicine

## 2016-05-03 ENCOUNTER — Ambulatory Visit: Payer: Medicare Other | Admitting: Internal Medicine

## 2016-06-13 ENCOUNTER — Ambulatory Visit (INDEPENDENT_AMBULATORY_CARE_PROVIDER_SITE_OTHER): Payer: Medicare Other

## 2016-06-13 ENCOUNTER — Ambulatory Visit (INDEPENDENT_AMBULATORY_CARE_PROVIDER_SITE_OTHER): Payer: Medicare Other | Admitting: Family Medicine

## 2016-06-13 VITALS — BP 130/84 | HR 90 | Temp 97.7°F | Resp 17 | Ht 63.0 in | Wt 183.0 lb

## 2016-06-13 DIAGNOSIS — M79604 Pain in right leg: Secondary | ICD-10-CM

## 2016-06-13 DIAGNOSIS — M791 Myalgia: Secondary | ICD-10-CM

## 2016-06-13 DIAGNOSIS — M7918 Myalgia, other site: Secondary | ICD-10-CM

## 2016-06-13 DIAGNOSIS — I10 Essential (primary) hypertension: Secondary | ICD-10-CM

## 2016-06-13 DIAGNOSIS — M25551 Pain in right hip: Secondary | ICD-10-CM | POA: Diagnosis not present

## 2016-06-13 DIAGNOSIS — E785 Hyperlipidemia, unspecified: Secondary | ICD-10-CM | POA: Diagnosis not present

## 2016-06-13 DIAGNOSIS — M1611 Unilateral primary osteoarthritis, right hip: Secondary | ICD-10-CM | POA: Diagnosis not present

## 2016-06-13 LAB — COMPLETE METABOLIC PANEL WITH GFR
ALT: 17 U/L (ref 6–29)
AST: 21 U/L (ref 10–35)
Albumin: 4.4 g/dL (ref 3.6–5.1)
Alkaline Phosphatase: 53 U/L (ref 33–130)
BUN: 28 mg/dL — ABNORMAL HIGH (ref 7–25)
CO2: 26 mmol/L (ref 20–31)
Calcium: 9.7 mg/dL (ref 8.6–10.4)
Chloride: 105 mmol/L (ref 98–110)
Creat: 0.88 mg/dL (ref 0.60–0.93)
GFR, Est African American: 77 mL/min (ref >=60)
GFR, Est Non African American: 67 mL/min (ref >=60)
Glucose, Bld: 115 mg/dL — ABNORMAL HIGH (ref 65–99)
Potassium: 4.3 mmol/L (ref 3.5–5.3)
Sodium: 141 mmol/L (ref 135–146)
Total Bilirubin: 0.5 mg/dL (ref 0.2–1.2)
Total Protein: 6.9 g/dL (ref 6.1–8.1)

## 2016-06-13 LAB — LIPID PANEL
Cholesterol: 157 mg/dL (ref 125–200)
HDL: 73 mg/dL (ref >=46)
LDL Cholesterol: 68 mg/dL (ref <130)
Total CHOL/HDL Ratio: 2.2 Ratio (ref <=5.0)
Triglycerides: 80 mg/dL (ref <150)
VLDL: 16 mg/dL (ref <30)

## 2016-06-13 LAB — CK: Total CK: 117 U/L (ref 7–177)

## 2016-06-13 MED ORDER — ATORVASTATIN CALCIUM 20 MG PO TABS: 20.0000 mg | ORAL_TABLET | Freq: Every day | ORAL | 1 refills | Status: DC

## 2016-06-13 MED ORDER — OLMESARTAN MEDOXOMIL-HCTZ 20-12.5 MG PO TABS: 1.0000 | ORAL_TABLET | Freq: Every day | ORAL | 1 refills | Status: DC

## 2016-06-13 MED ORDER — FENOFIBRATE 145 MG PO TABS: 145.0000 mg | ORAL_TABLET | Freq: Every day | ORAL | 1 refills | Status: DC

## 2016-06-13 NOTE — Patient Instructions (Addendum)
I will refill your medication for now, and will need to return to discuss those medications and other medical problems further if you are planning on having [place] follow you for your primary care.   Your right leg pain may be due to sciatica, or spasm of the muscles. Work on the stretches as we discussed in the office, Advil or Aleve over-the-counter as needed, and I will check a muscle enzyme test based on your current medications. Follow-up with me within 1 week if that is not improving, sooner if worse.   Sciatica With Rehab The sciatic nerve runs from the back down the leg and is responsible for sensation and control of the muscles in the back (posterior) side of the thigh, lower leg, and foot. Sciatica is a condition that is characterized by inflammation of this nerve.  SYMPTOMS   Signs of nerve damage, including numbness and/or weakness along the posterior side of the lower extremity.  Pain in the back of the thigh that may also travel down the leg.  Pain that worsens when sitting for long periods of time.  Occasionally, pain in the back or buttock. CAUSES  Inflammation of the sciatic nerve is the cause of sciatica. The inflammation is due to something irritating the nerve. Common sources of irritation include:  Sitting for long periods of time.  Direct trauma to the nerve.  Arthritis of the spine.  Herniated or ruptured disk.  Slipping of the vertebrae (spondylolisthesis).  Pressure from soft tissues, such as muscles or ligament-like tissue (fascia). RISK INCREASES WITH:  Sports that place pressure or stress on the spine (football or weightlifting).  Poor strength and flexibility.  Failure to warm up properly before activity.  Family history of low back pain or disk disorders.  Previous back injury or surgery.  Poor body mechanics, especially when lifting, or poor posture. PREVENTION   Warm up and stretch properly before activity.  Maintain physical  fitness:  Strength, flexibility, and endurance.  Cardiovascular fitness.  Learn and use proper technique, especially with posture and lifting. When possible, have coach correct improper technique.  Avoid activities that place stress on the spine. PROGNOSIS If treated properly, then sciatica usually resolves within 6 weeks. However, occasionally surgery is necessary.  RELATED COMPLICATIONS   Permanent nerve damage, including pain, numbness, tingle, or weakness.  Chronic back pain.  Risks of surgery: infection, bleeding, nerve damage, or damage to surrounding tissues. TREATMENT Treatment initially involves resting from any activities that aggravate your symptoms. The use of ice and medication may help reduce pain and inflammation. The use of strengthening and stretching exercises may help reduce pain with activity. These exercises may be performed at home or with referral to a therapist. A therapist may recommend further treatments, such as transcutaneous electronic nerve stimulation (TENS) or ultrasound. Your caregiver may recommend corticosteroid injections to help reduce inflammation of the sciatic nerve. If symptoms persist despite non-surgical (conservative) treatment, then surgery may be recommended. MEDICATION  If pain medication is necessary, then nonsteroidal anti-inflammatory medications, such as aspirin and ibuprofen, or other minor pain relievers, such as acetaminophen, are often recommended.  Do not take pain medication for 7 days before surgery.  Prescription pain relievers may be given if deemed necessary by your caregiver. Use only as directed and only as much as you need.  Ointments applied to the skin may be helpful.  Corticosteroid injections may be given by your caregiver. These injections should be reserved for the most serious cases, because they may only  be given a certain number of times. HEAT AND COLD  Cold treatment (icing) relieves pain and reduces  inflammation. Cold treatment should be applied for 10 to 15 minutes every 2 to 3 hours for inflammation and pain and immediately after any activity that aggravates your symptoms. Use ice packs or massage the area with a piece of ice (ice massage).  Heat treatment may be used prior to performing the stretching and strengthening activities prescribed by your caregiver, physical therapist, or athletic trainer. Use a heat pack or soak the injury in warm water. SEEK MEDICAL CARE IF:  Treatment seems to offer no benefit, or the condition worsens.  Any medications produce adverse side effects. EXERCISES  RANGE OF MOTION (ROM) AND STRETCHING EXERCISES - Sciatica Most people with sciatic will find that their symptoms worsen with either excessive bending forward (flexion) or arching at the low back (extension). The exercises which will help resolve your symptoms will focus on the opposite motion. Your physician, physical therapist or athletic trainer will help you determine which exercises will be most helpful to resolve your low back pain. Do not complete any exercises without first consulting with your clinician. Discontinue any exercises which worsen your symptoms until you speak to your clinician. If you have pain, numbness or tingling which travels down into your buttocks, leg or foot, the goal of the therapy is for these symptoms to move closer to your back and eventually resolve. Occasionally, these leg symptoms will get better, but your low back pain may worsen; this is typically an indication of progress in your rehabilitation. Be certain to be very alert to any changes in your symptoms and the activities in which you participated in the 24 hours prior to the change. Sharing this information with your clinician will allow him/her to most efficiently treat your condition. These exercises may help you when beginning to rehabilitate your injury. Your symptoms may resolve with or without further involvement  from your physician, physical therapist or athletic trainer. While completing these exercises, remember:   Restoring tissue flexibility helps normal motion to return to the joints. This allows healthier, less painful movement and activity.  An effective stretch should be held for at least 30 seconds.  A stretch should never be painful. You should only feel a gentle lengthening or release in the stretched tissue. FLEXION RANGE OF MOTION AND STRETCHING EXERCISES: STRETCH - Flexion, Single Knee to Chest   Lie on a firm bed or floor with both legs extended in front of you.  Keeping one leg in contact with the floor, bring your opposite knee to your chest. Hold your leg in place by either grabbing behind your thigh or at your knee.  Pull until you feel a gentle stretch in your low back. Hold __________ seconds.  Slowly release your grasp and repeat the exercise with the opposite side. Repeat __________ times. Complete this exercise __________ times per day.  STRETCH - Flexion, Double Knee to Chest  Lie on a firm bed or floor with both legs extended in front of you.  Keeping one leg in contact with the floor, bring your opposite knee to your chest.  Tense your stomach muscles to support your back and then lift your other knee to your chest. Hold your legs in place by either grabbing behind your thighs or at your knees.  Pull both knees toward your chest until you feel a gentle stretch in your low back. Hold __________ seconds.  Tense your stomach muscles and slowly  return one leg at a time to the floor. Repeat __________ times. Complete this exercise __________ times per day.  STRETCH - Low Trunk Rotation   Lie on a firm bed or floor. Keeping your legs in front of you, bend your knees so they are both pointed toward the ceiling and your feet are flat on the floor.  Extend your arms out to the side. This will stabilize your upper body by keeping your shoulders in contact with the  floor.  Gently and slowly drop both knees together to one side until you feel a gentle stretch in your low back. Hold for __________ seconds.  Tense your stomach muscles to support your low back as you bring your knees back to the starting position. Repeat the exercise to the other side. Repeat __________ times. Complete this exercise __________ times per day  EXTENSION RANGE OF MOTION AND FLEXIBILITY EXERCISES: STRETCH - Extension, Prone on Elbows  Lie on your stomach on the floor, a bed will be too soft. Place your palms about shoulder width apart and at the height of your head.  Place your elbows under your shoulders. If this is too painful, stack pillows under your chest.  Allow your body to relax so that your hips drop lower and make contact more completely with the floor.  Hold this position for __________ seconds.  Slowly return to lying flat on the floor. Repeat __________ times. Complete this exercise __________ times per day.  RANGE OF MOTION - Extension, Prone Press Ups  Lie on your stomach on the floor, a bed will be too soft. Place your palms about shoulder width apart and at the height of your head.  Keeping your back as relaxed as possible, slowly straighten your elbows while keeping your hips on the floor. You may adjust the placement of your hands to maximize your comfort. As you gain motion, your hands will come more underneath your shoulders.  Hold this position __________ seconds.  Slowly return to lying flat on the floor. Repeat __________ times. Complete this exercise __________ times per day.  STRENGTHENING EXERCISES - Sciatica  These exercises may help you when beginning to rehabilitate your injury. These exercises should be done near your "sweet spot." This is the neutral, low-back arch, somewhere between fully rounded and fully arched, that is your least painful position. When performed in this safe range of motion, these exercises can be used for people who  have either a flexion or extension based injury. These exercises may resolve your symptoms with or without further involvement from your physician, physical therapist or athletic trainer. While completing these exercises, remember:   Muscles can gain both the endurance and the strength needed for everyday activities through controlled exercises.  Complete these exercises as instructed by your physician, physical therapist or athletic trainer. Progress with the resistance and repetition exercises only as your caregiver advises.  You may experience muscle soreness or fatigue, but the pain or discomfort you are trying to eliminate should never worsen during these exercises. If this pain does worsen, stop and make certain you are following the directions exactly. If the pain is still present after adjustments, discontinue the exercise until you can discuss the trouble with your clinician. STRENGTHENING - Deep Abdominals, Pelvic Tilt   Lie on a firm bed or floor. Keeping your legs in front of you, bend your knees so they are both pointed toward the ceiling and your feet are flat on the floor.  Tense your lower abdominal muscles  to press your low back into the floor. This motion will rotate your pelvis so that your tail bone is scooping upwards rather than pointing at your feet or into the floor.  With a gentle tension and even breathing, hold this position for __________ seconds. Repeat __________ times. Complete this exercise __________ times per day.  STRENGTHENING - Abdominals, Crunches   Lie on a firm bed or floor. Keeping your legs in front of you, bend your knees so they are both pointed toward the ceiling and your feet are flat on the floor. Cross your arms over your chest.  Slightly tip your chin down without bending your neck.  Tense your abdominals and slowly lift your trunk high enough to just clear your shoulder blades. Lifting higher can put excessive stress on the low back and does not  further strengthen your abdominal muscles.  Control your return to the starting position. Repeat __________ times. Complete this exercise __________ times per day.  STRENGTHENING - Quadruped, Opposite UE/LE Lift  Assume a hands and knees position on a firm surface. Keep your hands under your shoulders and your knees under your hips. You may place padding under your knees for comfort.  Find your neutral spine and gently tense your abdominal muscles so that you can maintain this position. Your shoulders and hips should form a rectangle that is parallel with the floor and is not twisted.  Keeping your trunk steady, lift your right hand no higher than your shoulder and then your left leg no higher than your hip. Make sure you are not holding your breath. Hold this position __________ seconds.  Continuing to keep your abdominal muscles tense and your back steady, slowly return to your starting position. Repeat with the opposite arm and leg. Repeat __________ times. Complete this exercise __________ times per day.  STRENGTHENING - Abdominals and Quadriceps, Straight Leg Raise   Lie on a firm bed or floor with both legs extended in front of you.  Keeping one leg in contact with the floor, bend the other knee so that your foot can rest flat on the floor.  Find your neutral spine, and tense your abdominal muscles to maintain your spinal position throughout the exercise.  Slowly lift your straight leg off the floor about 6 inches for a count of 15, making sure to not hold your breath.  Still keeping your neutral spine, slowly lower your leg all the way to the floor. Repeat this exercise with each leg __________ times. Complete this exercise __________ times per day. POSTURE AND BODY MECHANICS CONSIDERATIONS - Sciatica Keeping correct posture when sitting, standing or completing your activities will reduce the stress put on different body tissues, allowing injured tissues a chance to heal and  limiting painful experiences. The following are general guidelines for improved posture. Your physician or physical therapist will provide you with any instructions specific to your needs. While reading these guidelines, remember:  The exercises prescribed by your provider will help you have the flexibility and strength to maintain correct postures.  The correct posture provides the optimal environment for your joints to work. All of your joints have less wear and tear when properly supported by a spine with good posture. This means you will experience a healthier, less painful body.  Correct posture must be practiced with all of your activities, especially prolonged sitting and standing. Correct posture is as important when doing repetitive low-stress activities (typing) as it is when doing a single heavy-load activity (lifting). RESTING POSITIONS  Consider which positions are most painful for you when choosing a resting position. If you have pain with flexion-based activities (sitting, bending, stooping, squatting), choose a position that allows you to rest in a less flexed posture. You would want to avoid curling into a fetal position on your side. If your pain worsens with extension-based activities (prolonged standing, working overhead), avoid resting in an extended position such as sleeping on your stomach. Most people will find more comfort when they rest with their spine in a more neutral position, neither too rounded nor too arched. Lying on a non-sagging bed on your side with a pillow between your knees, or on your back with a pillow under your knees will often provide some relief. Keep in mind, being in any one position for a prolonged period of time, no matter how correct your posture, can still lead to stiffness. PROPER SITTING POSTURE In order to minimize stress and discomfort on your spine, you must sit with correct posture Sitting with good posture should be effortless for a healthy body.  Returning to good posture is a gradual process. Many people can work toward this most comfortably by using various supports until they have the flexibility and strength to maintain this posture on their own. When sitting with proper posture, your ears will fall over your shoulders and your shoulders will fall over your hips. You should use the back of the chair to support your upper back. Your low back will be in a neutral position, just slightly arched. You may place a small pillow or folded towel at the base of your low back for support.  When working at a desk, create an environment that supports good, upright posture. Without extra support, muscles fatigue and lead to excessive strain on joints and other tissues. Keep these recommendations in mind: CHAIR:   A chair should be able to slide under your desk when your back makes contact with the back of the chair. This allows you to work closely.  The chair's height should allow your eyes to be level with the upper part of your monitor and your hands to be slightly lower than your elbows. BODY POSITION  Your feet should make contact with the floor. If this is not possible, use a foot rest.  Keep your ears over your shoulders. This will reduce stress on your neck and low back. INCORRECT SITTING POSTURES   If you are feeling tired and unable to assume a healthy sitting posture, do not slouch or slump. This puts excessive strain on your back tissues, causing more damage and pain. Healthier options include:  Using more support, like a lumbar pillow.  Switching tasks to something that requires you to be upright or walking.  Talking a brief walk.  Lying down to rest in a neutral-spine position. PROLONGED STANDING WHILE SLIGHTLY LEANING FORWARD  When completing a task that requires you to lean forward while standing in one place for a long time, place either foot up on a stationary 2-4 inch high object to help maintain the best posture. When both  feet are on the ground, the low back tends to lose its slight inward curve. If this curve flattens (or becomes too large), then the back and your other joints will experience too much stress, fatigue more quickly and can cause pain.  CORRECT STANDING POSTURES Proper standing posture should be assumed with all daily activities, even if they only take a few moments, like when brushing your teeth. As in sitting, your  ears should fall over your shoulders and your shoulders should fall over your hips. You should keep a slight tension in your abdominal muscles to brace your spine. Your tailbone should point down to the ground, not behind your body, resulting in an over-extended swayback posture.  INCORRECT STANDING POSTURES  Common incorrect standing postures include a forward head, locked knees and/or an excessive swayback. WALKING Walk with an upright posture. Your ears, shoulders and hips should all line-up. PROLONGED ACTIVITY IN A FLEXED POSITION When completing a task that requires you to bend forward at your waist or lean over a low surface, try to find a way to stabilize 3 of 4 of your limbs. You can place a hand or elbow on your thigh or rest a knee on the surface you are reaching across. This will provide you more stability so that your muscles do not fatigue as quickly. By keeping your knees relaxed, or slightly bent, you will also reduce stress across your low back. CORRECT LIFTING TECHNIQUES DO :   Assume a wide stance. This will provide you more stability and the opportunity to get as close as possible to the object which you are lifting.  Tense your abdominals to brace your spine; then bend at the knees and hips. Keeping your back locked in a neutral-spine position, lift using your leg muscles. Lift with your legs, keeping your back straight.  Test the weight of unknown objects before attempting to lift them.  Try to keep your elbows locked down at your sides in order get the best strength  from your shoulders when carrying an object.  Always ask for help when lifting heavy or awkward objects. INCORRECT LIFTING TECHNIQUES DO NOT:   Lock your knees when lifting, even if it is a small object.  Bend and twist. Pivot at your feet or move your feet when needing to change directions.  Assume that you cannot safely pick up a paperclip without proper posture.   This information is not intended to replace advice given to you by your health care provider. Make sure you discuss any questions you have with your health care provider.   Document Released: 10/01/2005 Document Revised: 02/15/2015 Document Reviewed: 01/13/2009 Else[employer] Interactive Patient Education 2016 [employer].    IF you received an x-ray today, you will receive an invoice from [facility]. Please contact [facility] at [phone] with questions or concerns regarding your invoice.   IF you received labwork today, you will receive an invoice from [employer]. Please contact [facility] at [phone] with questions or concerns regarding your invoice.   Our billing staff will not be able to assist you with questions regarding bills from these companies.  You will be contacted with the lab results as soon as they are available. The fastest way to get your results is to activate your My Chart account. Instructions are located on the last page of this paperwork. If you have not heard from [place] regarding the results in 2 weeks, please contact this office.

## 2016-06-13 NOTE — Progress Notes (Signed)
By signing my name below, I, Mesha Guinyard, attest that this documentation has been prepared under the direction and in the presence of Merri Ray.  Electronically Signed: Verlee Monte, Medical Scribe. 06/13/16. 11:38 AM.  Subjective:    Patient ID: [name], female    DOB: 1946/02/10, 70 y.o.   MRN: [id number]  HPI Chief Complaint  Patient presents with  . Medication Refill    olmesartan, fenofibrate, atorvastatin  . Leg Pain    Upper right thigh. NKI. x3weeks    HPI Comments: [name] is a 70 y.o. female who presents to the [facility] complaining of multiple concerns.  Right buttock thigh pain: Pt has a hx of DDD of lumbar spine and followed by Dr. Lynann Bologna. Pt reports worsening right buttock thigh pain onset 3-4 weeks ago. Pain is reproduced with ambulation, and putting pressure on her leg. Pt first noticed thigh pain when she was laying on a couch. Pt uses ice pack, heat packs, tylenol, advil, motrin TID or QID, and naproxen without relief to her symptoms. Pt denies previous injury, radiating pain, back pain, and rash.  Medication refill: Pt's PCP was [facility] in [place], but he stopped practicing in December due to injury. He normally fills her Rx. Pt lives here and her son suggest her to so to a provider on [place]. HTN: Takes Benicar HCT 20-12.5 mg QD. Pt denies missing any doses of her medication. Pt denies experiencing SOB, light-headedness, dizziness, HA, chest pain or any other negative side effect of her medication. Lab Results  Component Value Date   CREATININE 0.71 03/15/2012   HLD: Pt takes fenofibrate 145 mg, and Lipitor 20 mg. Pt denies missing any doses of her medication. Pt denies PMHx of DM. Pt has been taking fenofibrate and a statin for years without myopathy. No results found for: CHOL, HDL, LDLCALC, LDLDIRECT, TRIG, CHOLHDL  Lab Results  Component Value Date   ALT 36 (H) 02/29/2012   AST 32 02/29/2012   ALKPHOS 74 02/29/2012   BILITOT 0.3 02/29/2012    Patient Active Problem List   Diagnosis Date Noted  . Cough variant asthma 04/04/2016  . Upper airway cough syndrome 04/04/2016  . 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 Arthritis   . GERD (gastroesophageal reflux disease)    at times  . Hyperlipidemia   . Hypertension   . Peripheral vascular disease ([place])    has spider veins  . Pneumothorax, traumatic    RIGHT; associated with multiple rib fractures after a fall   Past Surgical History:  Procedure Laterality Date  . ABDOMINAL HYSTERECTOMY  1982  . 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 BREAST SURGERY    . JOINT REPLACEMENT  2009   right knee  . JOINT REPLACEMENT  2011   left knee  . MASTECTOMY  2008   right breast  . SHOULDER ARTHROSCOPY DISTAL CLAVICLE EXCISION AND OPEN ROTATOR CUFF REPAIR  2011  . TOTAL SHOULDER ARTHROPLASTY  03/14/2012   Procedure: TOTAL SHOULDER ARTHROPLASTY;  Surgeon: Alta Corning, MD;  Location: [facility];  Service: Orthopedics;  Laterality: Left;   Allergies  Allergen Reactions  . Dilaudid [Hydromorphone Hcl] Itching  . Morphine And Related Nausea And Vomiting   Prior to Admission medications   Medication Sig Start Date End Date Taking? Authorizing Provider  acetaminophen (TYLENOL) 325 MG tablet Take 650 mg by mouth every 6 (six)  hours as needed.   Yes Historical Provider, MD  albuterol (PROAIR HFA) 108 (90 Base) MCG/ACT inhaler Inhale 2 puffs into the lungs every 6 (six) hours as needed for wheezing or shortness of breath.   Yes Historical Provider, MD  atorvastatin (LIPITOR) 20 MG tablet Take 20 mg by mouth daily.   Yes Historical Provider, MD  diphenhydrAMINE (SOMINEX) 25 MG tablet Take 50 mg by mouth at bedtime as needed for sleep.   Yes Historical Provider, MD  fenofibrate (TRICOR) 145 MG tablet Take 145 mg by mouth daily.   Yes Historical Provider, MD  Guaifenesin (MUCINEX MAXIMUM STRENGTH)  1200 MG TB12 Take 1 tablet (1,200 mg total) by mouth every 12 (twelve) hours as needed. 11/28/15  Yes Chelle Jeffery, PA-C  ibuprofen (ADVIL,MOTRIN) 100 MG tablet Take 100 mg by mouth every 6 (six) hours as needed.   Yes Historical Provider, MD  mometasone-formoterol (DULERA) 100-5 MCG/ACT AERO Take 2 puffs first thing in am and then another 2 puffs about 12 hours later. 04/04/16  Yes Tanda Rockers, MD  Multiple Vitamin (MULTIVITAMIN) capsule Take 1 capsule by mouth daily.   Yes Historical Provider, MD  olmesartan-hydrochlorothiazide (BENICAR HCT) 20-12.5 MG per tablet Take 1 tablet by mouth daily.   Yes Historical Provider, MD  oxymetazoline (AFRIN) 0.05 % nasal spray Place 1 spray into both nostrils Nightly.   Yes Historical Provider, MD   Social History   Social History  . Marital status: Divorced    Spouse name: N/A  . Number of children: 2  . Years of education: N/A   Occupational History  . Retired   . Machine Operator     [employer]   Social History Main Topics  . Smoking status: Never Smoker  . Smokeless tobacco: Never Used  . Alcohol use No  . Drug use: No  . Sexual activity: Not on file   Other Topics Concern  . Not on file   Social History Narrative  . No narrative on file   Depression screen [facility] 2/9 06/13/2016 03/06/2016 11/28/2015 03/12/2015 02/03/2015  Decreased Interest 0 0 0 0 0  Down, Depressed, Hopeless 0 0 0 0 0  PHQ - 2 Score 0 0 0 0 0   Review of Systems  Constitutional: Negative for fatigue and unexpected weight change.  Respiratory: Negative for chest tightness and shortness of breath.   Cardiovascular: Negative for chest pain, palpitations and leg swelling.  Gastrointestinal: Negative for abdominal pain and blood in stool.  Musculoskeletal: Positive for myalgias. Negative for back pain.  Skin: Negative for rash.  Neurological: Negative for dizziness, syncope, light-headedness and headaches.   Objective:  Physical Exam  Constitutional: She  is oriented to person, place, and time. She appears well-developed and well-nourished.  HENT:  Head: Normocephalic and atraumatic.  Eyes: Conjunctivae and EOM are normal. Pupils are equal, round, and reactive to light.  Neck: Carotid bruit is not present.  Cardiovascular: Normal rate, regular rhythm, normal heart sounds and intact distal pulses.   Pulmonary/Chest: Effort normal and breath sounds normal.  Abdominal: Soft. She exhibits no pulsatile midline mass. There is no tenderness.  Musculoskeletal:  Lumbar spine: no mid line bony tenderness Tender along the right sciatic notch Paini nto the back of the uttock with internal hip rotation With straight leg raise she feels a pulling sensation to the buttock to the thigh Pain in posterior with rotation into the hip, but not into the groin  Neurological: She is alert and oriented to  person, place, and time.  Reflex Scores:      Patellar reflexes are 2+ on the right side and 2+ on the left side.      Achilles reflexes are 2+ on the right side and 2+ on the left side. Skin: Skin is warm and dry. No rash noted.  No blisters or bumps No wounds  Psychiatric: She has a normal mood and affect. Her behavior is normal.  Vitals reviewed.  BP 130/84 (BP Location: Left Arm, Patient Position: Sitting, Cuff Size: Normal)   Pulse 90   Temp 97.7 F (36.5 C) (Oral)   Resp 17   Ht 5\' 3"  (1.6 m)   Wt 183 lb (83 kg)   SpO2 98%   BMI 32.42 kg/m   Dg Hip Unilat W Or W/o Pelvis 2-3 Views Right  Result Date: 06/13/2016 CLINICAL DATA:  Hip pain. EXAM: DG HIP (WITH OR WITHOUT PELVIS) 2-3V RIGHT COMPARISON:  No prior. FINDINGS: Degenerative changes lumbar spine and both hips. No acute bony abnormality identified. Pelvic calcifications consistent with phleboliths. IMPRESSION: No acute abnormality identified. Degenerative changes lumbar spine and both hips . Electronically Signed   By: Marcello Moores  Register   On: 06/13/2016 12:12   Assessment & Plan:    [name] is a 70 y.o. female Right buttock pain - Plan: DG HIP UNILAT W OR W/O PELVIS 2-3 VIEWS RIGHT Right leg pain - Plan: CK Right hip pain - Plan: DG HIP UNILAT W OR W/O PELVIS 2-3 VIEWS RIGHT  - Pain appears to be due to sciatic area, or upper hamstring. Discomfort with stretching, possible piriformis component. She is on both fibroid and statin, so we'll check CPK, but has been on these for extended amount of time and tolerated them well previously.   -Range of motion/stretches were discussed, recheck if not improving in next 1 week. Sooner if worse.   Essential hypertension - Plan: olmesartan-hydrochlorothiazide (BENICAR HCT) 20-12.5 MG tablet  - stable. Medications refilled, labs pending   Hyperlipidemia - Plan: COMPLETE METABOLIC PANEL WITH GFR, Lipid panel, fenofibrate (TRICOR) 145 MG tablet, atorvastatin (LIPITOR) 20 MG tablet  -Labs pending, will continue Lipitor and TriCor for now, added risk of myopathy discussed.  Meds ordered this encounter  Medications  . Multiple Vitamin (MULTIVITAMIN) capsule    Sig: Take 1 capsule by mouth daily.  Marland Kitchen olmesartan-hydrochlorothiazide (BENICAR HCT) 20-12.5 MG tablet    Sig: Take 1 tablet by mouth daily.    Dispense:  90 tablet    Refill:  1  . fenofibrate (TRICOR) 145 MG tablet    Sig: Take 1 tablet (145 mg total) by mouth daily.    Dispense:  90 tablet    Refill:  1  . atorvastatin (LIPITOR) 20 MG tablet    Sig: Take 1 tablet (20 mg total) by mouth daily.    Dispense:  90 tablet    Refill:  1   Patient Instructions    I will refill your medication for now, and will need to return to discuss those medications and other medical problems further if you are planning on having [place] follow you for your primary care.   Your right leg pain may be due to sciatica, or spasm of the muscles. Work on the stretches as we discussed in the office, Advil or Aleve over-the-counter as needed, and I will check a muscle enzyme test based on your current  medications. Follow-up with me within 1 week if that is not improving, sooner if worse.  Sciatica With Rehab The sciatic nerve runs from the back down the leg and is responsible for sensation and control of the muscles in the back (posterior) side of the thigh, lower leg, and foot. Sciatica is a condition that is characterized by inflammation of this nerve.  SYMPTOMS   Signs of nerve damage, including numbness and/or weakness along the posterior side of the lower extremity.  Pain in the back of the thigh that may also travel down the leg.  Pain that worsens when sitting for long periods of time.  Occasionally, pain in the back or buttock. CAUSES  Inflammation of the sciatic nerve is the cause of sciatica. The inflammation is due to something irritating the nerve. Common sources of irritation include:  Sitting for long periods of time.  Direct trauma to the nerve.  Arthritis of the spine.  Herniated or ruptured disk.  Slipping of the vertebrae (spondylolisthesis).  Pressure from soft tissues, such as muscles or ligament-like tissue (fascia). RISK INCREASES WITH:  Sports that place pressure or stress on the spine (football or weightlifting).  Poor strength and flexibility.  Failure to warm up properly before activity.  Family history of low back pain or disk disorders.  Previous back injury or surgery.  Poor body mechanics, especially when lifting, or poor posture. PREVENTION   Warm up and stretch properly before activity.  Maintain physical fitness:  Strength, flexibility, and endurance.  Cardiovascular fitness.  Learn and use proper technique, especially with posture and lifting. When possible, have coach correct improper technique.  Avoid activities that place stress on the spine. PROGNOSIS If treated properly, then sciatica usually resolves within 6 weeks. However, occasionally surgery is necessary.  RELATED COMPLICATIONS   Permanent nerve damage,  including pain, numbness, tingle, or weakness.  Chronic back pain.  Risks of surgery: infection, bleeding, nerve damage, or damage to surrounding tissues. TREATMENT Treatment initially involves resting from any activities that aggravate your symptoms. The use of ice and medication may help reduce pain and inflammation. The use of strengthening and stretching exercises may help reduce pain with activity. These exercises may be performed at home or with referral to a therapist. A therapist may recommend further treatments, such as transcutaneous electronic nerve stimulation (TENS) or ultrasound. Your caregiver may recommend corticosteroid injections to help reduce inflammation of the sciatic nerve. If symptoms persist despite non-surgical (conservative) treatment, then surgery may be recommended. MEDICATION  If pain medication is necessary, then nonsteroidal anti-inflammatory medications, such as aspirin and ibuprofen, or other minor pain relievers, such as acetaminophen, are often recommended.  Do not take pain medication for 7 days before surgery.  Prescription pain relievers may be given if deemed necessary by your caregiver. Use only as directed and only as much as you need.  Ointments applied to the skin may be helpful.  Corticosteroid injections may be given by your caregiver. These injections should be reserved for the most serious cases, because they may only be given a certain number of times. HEAT AND COLD  Cold treatment (icing) relieves pain and reduces inflammation. Cold treatment should be applied for 10 to 15 minutes every 2 to 3 hours for inflammation and pain and immediately after any activity that aggravates your symptoms. Use ice packs or massage the area with a piece of ice (ice massage).  Heat treatment may be used prior to performing the stretching and strengthening activities prescribed by your caregiver, physical therapist, or athletic trainer. Use a heat pack or soak the  injury in warm  water. SEEK MEDICAL CARE IF:  Treatment seems to offer no benefit, or the condition worsens.  Any medications produce adverse side effects. EXERCISES  RANGE OF MOTION (ROM) AND STRETCHING EXERCISES - Sciatica Most people with sciatic will find that their symptoms worsen with either excessive bending forward (flexion) or arching at the low back (extension). The exercises which will help resolve your symptoms will focus on the opposite motion. Your physician, physical therapist or athletic trainer will help you determine which exercises will be most helpful to resolve your low back pain. Do not complete any exercises without first consulting with your clinician. Discontinue any exercises which worsen your symptoms until you speak to your clinician. If you have pain, numbness or tingling which travels down into your buttocks, leg or foot, the goal of the therapy is for these symptoms to move closer to your back and eventually resolve. Occasionally, these leg symptoms will get better, but your low back pain may worsen; this is typically an indication of progress in your rehabilitation. Be certain to be very alert to any changes in your symptoms and the activities in which you participated in the 24 hours prior to the change. Sharing this information with your clinician will allow him/her to most efficiently treat your condition. These exercises may help you when beginning to rehabilitate your injury. Your symptoms may resolve with or without further involvement from your physician, physical therapist or athletic trainer. While completing these exercises, remember:   Restoring tissue flexibility helps normal motion to return to the joints. This allows healthier, less painful movement and activity.  An effective stretch should be held for at least 30 seconds.  A stretch should never be painful. You should only feel a gentle lengthening or release in the stretched tissue. FLEXION RANGE OF  MOTION AND STRETCHING EXERCISES: STRETCH - Flexion, Single Knee to Chest   Lie on a firm bed or floor with both legs extended in front of you.  Keeping one leg in contact with the floor, bring your opposite knee to your chest. Hold your leg in place by either grabbing behind your thigh or at your knee.  Pull until you feel a gentle stretch in your low back. Hold __________ seconds.  Slowly release your grasp and repeat the exercise with the opposite side. Repeat __________ times. Complete this exercise __________ times per day.  STRETCH - Flexion, Double Knee to Chest  Lie on a firm bed or floor with both legs extended in front of you.  Keeping one leg in contact with the floor, bring your opposite knee to your chest.  Tense your stomach muscles to support your back and then lift your other knee to your chest. Hold your legs in place by either grabbing behind your thighs or at your knees.  Pull both knees toward your chest until you feel a gentle stretch in your low back. Hold __________ seconds.  Tense your stomach muscles and slowly return one leg at a time to the floor. Repeat __________ times. Complete this exercise __________ times per day.  STRETCH - Low Trunk Rotation   Lie on a firm bed or floor. Keeping your legs in front of you, bend your knees so they are both pointed toward the ceiling and your feet are flat on the floor.  Extend your arms out to the side. This will stabilize your upper body by keeping your shoulders in contact with the floor.  Gently and slowly drop both knees together to one side until  you feel a gentle stretch in your low back. Hold for __________ seconds.  Tense your stomach muscles to support your low back as you bring your knees back to the starting position. Repeat the exercise to the other side. Repeat __________ times. Complete this exercise __________ times per day  EXTENSION RANGE OF MOTION AND FLEXIBILITY EXERCISES: STRETCH - Extension, Prone  on Elbows  Lie on your stomach on the floor, a bed will be too soft. Place your palms about shoulder width apart and at the height of your head.  Place your elbows under your shoulders. If this is too painful, stack pillows under your chest.  Allow your body to relax so that your hips drop lower and make contact more completely with the floor.  Hold this position for __________ seconds.  Slowly return to lying flat on the floor. Repeat __________ times. Complete this exercise __________ times per day.  RANGE OF MOTION - Extension, Prone Press Ups  Lie on your stomach on the floor, a bed will be too soft. Place your palms about shoulder width apart and at the height of your head.  Keeping your back as relaxed as possible, slowly straighten your elbows while keeping your hips on the floor. You may adjust the placement of your hands to maximize your comfort. As you gain motion, your hands will come more underneath your shoulders.  Hold this position __________ seconds.  Slowly return to lying flat on the floor. Repeat __________ times. Complete this exercise __________ times per day.  STRENGTHENING EXERCISES - Sciatica  These exercises may help you when beginning to rehabilitate your injury. These exercises should be done near your "sweet spot." This is the neutral, low-back arch, somewhere between fully rounded and fully arched, that is your least painful position. When performed in this safe range of motion, these exercises can be used for people who have either a flexion or extension based injury. These exercises may resolve your symptoms with or without further involvement from your physician, physical therapist or athletic trainer. While completing these exercises, remember:   Muscles can gain both the endurance and the strength needed for everyday activities through controlled exercises.  Complete these exercises as instructed by your physician, physical therapist or athletic trainer.  Progress with the resistance and repetition exercises only as your caregiver advises.  You may experience muscle soreness or fatigue, but the pain or discomfort you are trying to eliminate should never worsen during these exercises. If this pain does worsen, stop and make certain you are following the directions exactly. If the pain is still present after adjustments, discontinue the exercise until you can discuss the trouble with your clinician. STRENGTHENING - Deep Abdominals, Pelvic Tilt   Lie on a firm bed or floor. Keeping your legs in front of you, bend your knees so they are both pointed toward the ceiling and your feet are flat on the floor.  Tense your lower abdominal muscles to press your low back into the floor. This motion will rotate your pelvis so that your tail bone is scooping upwards rather than pointing at your feet or into the floor.  With a gentle tension and even breathing, hold this position for __________ seconds. Repeat __________ times. Complete this exercise __________ times per day.  STRENGTHENING - Abdominals, Crunches   Lie on a firm bed or floor. Keeping your legs in front of you, bend your knees so they are both pointed toward the ceiling and your feet are flat on the  floor. Cross your arms over your chest.  Slightly tip your chin down without bending your neck.  Tense your abdominals and slowly lift your trunk high enough to just clear your shoulder blades. Lifting higher can put excessive stress on the low back and does not further strengthen your abdominal muscles.  Control your return to the starting position. Repeat __________ times. Complete this exercise __________ times per day.  STRENGTHENING - Quadruped, Opposite UE/LE Lift  Assume a hands and knees position on a firm surface. Keep your hands under your shoulders and your knees under your hips. You may place padding under your knees for comfort.  Find your neutral spine and gently tense your abdominal  muscles so that you can maintain this position. Your shoulders and hips should form a rectangle that is parallel with the floor and is not twisted.  Keeping your trunk steady, lift your right hand no higher than your shoulder and then your left leg no higher than your hip. Make sure you are not holding your breath. Hold this position __________ seconds.  Continuing to keep your abdominal muscles tense and your back steady, slowly return to your starting position. Repeat with the opposite arm and leg. Repeat __________ times. Complete this exercise __________ times per day.  STRENGTHENING - Abdominals and Quadriceps, Straight Leg Raise   Lie on a firm bed or floor with both legs extended in front of you.  Keeping one leg in contact with the floor, bend the other knee so that your foot can rest flat on the floor.  Find your neutral spine, and tense your abdominal muscles to maintain your spinal position throughout the exercise.  Slowly lift your straight leg off the floor about 6 inches for a count of 15, making sure to not hold your breath.  Still keeping your neutral spine, slowly lower your leg all the way to the floor. Repeat this exercise with each leg __________ times. Complete this exercise __________ times per day. POSTURE AND BODY MECHANICS CONSIDERATIONS - Sciatica Keeping correct posture when sitting, standing or completing your activities will reduce the stress put on different body tissues, allowing injured tissues a chance to heal and limiting painful experiences. The following are general guidelines for improved posture. Your physician or physical therapist will provide you with any instructions specific to your needs. While reading these guidelines, remember:  The exercises prescribed by your provider will help you have the flexibility and strength to maintain correct postures.  The correct posture provides the optimal environment for your joints to work. All of your joints have  less wear and tear when properly supported by a spine with good posture. This means you will experience a healthier, less painful body.  Correct posture must be practiced with all of your activities, especially prolonged sitting and standing. Correct posture is as important when doing repetitive low-stress activities (typing) as it is when doing a single heavy-load activity (lifting). RESTING POSITIONS Consider which positions are most painful for you when choosing a resting position. If you have pain with flexion-based activities (sitting, bending, stooping, squatting), choose a position that allows you to rest in a less flexed posture. You would want to avoid curling into a fetal position on your side. If your pain worsens with extension-based activities (prolonged standing, working overhead), avoid resting in an extended position such as sleeping on your stomach. Most people will find more comfort when they rest with their spine in a more neutral position, neither too rounded nor too  arched. Lying on a non-sagging bed on your side with a pillow between your knees, or on your back with a pillow under your knees will often provide some relief. Keep in mind, being in any one position for a prolonged period of time, no matter how correct your posture, can still lead to stiffness. PROPER SITTING POSTURE In order to minimize stress and discomfort on your spine, you must sit with correct posture Sitting with good posture should be effortless for a healthy body. Returning to good posture is a gradual process. Many people can work toward this most comfortably by using various supports until they have the flexibility and strength to maintain this posture on their own. When sitting with proper posture, your ears will fall over your shoulders and your shoulders will fall over your hips. You should use the back of the chair to support your upper back. Your low back will be in a neutral position, just slightly arched.  You may place a small pillow or folded towel at the base of your low back for support.  When working at a desk, create an environment that supports good, upright posture. Without extra support, muscles fatigue and lead to excessive strain on joints and other tissues. Keep these recommendations in mind: CHAIR:   A chair should be able to slide under your desk when your back makes contact with the back of the chair. This allows you to work closely.  The chair's height should allow your eyes to be level with the upper part of your monitor and your hands to be slightly lower than your elbows. BODY POSITION  Your feet should make contact with the floor. If this is not possible, use a foot rest.  Keep your ears over your shoulders. This will reduce stress on your neck and low back. INCORRECT SITTING POSTURES   If you are feeling tired and unable to assume a healthy sitting posture, do not slouch or slump. This puts excessive strain on your back tissues, causing more damage and pain. Healthier options include:  Using more support, like a lumbar pillow.  Switching tasks to something that requires you to be upright or walking.  Talking a brief walk.  Lying down to rest in a neutral-spine position. PROLONGED STANDING WHILE SLIGHTLY LEANING FORWARD  When completing a task that requires you to lean forward while standing in one place for a long time, place either foot up on a stationary 2-4 inch high object to help maintain the best posture. When both feet are on the ground, the low back tends to lose its slight inward curve. If this curve flattens (or becomes too large), then the back and your other joints will experience too much stress, fatigue more quickly and can cause pain.  CORRECT STANDING POSTURES Proper standing posture should be assumed with all daily activities, even if they only take a few moments, like when brushing your teeth. As in sitting, your ears should fall over your shoulders and  your shoulders should fall over your hips. You should keep a slight tension in your abdominal muscles to brace your spine. Your tailbone should point down to the ground, not behind your body, resulting in an over-extended swayback posture.  INCORRECT STANDING POSTURES  Common incorrect standing postures include a forward head, locked knees and/or an excessive swayback. WALKING Walk with an upright posture. Your ears, shoulders and hips should all line-up. PROLONGED ACTIVITY IN A FLEXED POSITION When completing a task that requires you to bend forward  at your waist or lean over a low surface, try to find a way to stabilize 3 of 4 of your limbs. You can place a hand or elbow on your thigh or rest a knee on the surface you are reaching across. This will provide you more stability so that your muscles do not fatigue as quickly. By keeping your knees relaxed, or slightly bent, you will also reduce stress across your low back. CORRECT LIFTING TECHNIQUES DO :   Assume a wide stance. This will provide you more stability and the opportunity to get as close as possible to the object which you are lifting.  Tense your abdominals to brace your spine; then bend at the knees and hips. Keeping your back locked in a neutral-spine position, lift using your leg muscles. Lift with your legs, keeping your back straight.  Test the weight of unknown objects before attempting to lift them.  Try to keep your elbows locked down at your sides in order get the best strength from your shoulders when carrying an object.  Always ask for help when lifting heavy or awkward objects. INCORRECT LIFTING TECHNIQUES DO NOT:   Lock your knees when lifting, even if it is a small object.  Bend and twist. Pivot at your feet or move your feet when needing to change directions.  Assume that you cannot safely pick up a paperclip without proper posture.   This information is not intended to replace advice given to you by your health  care provider. Make sure you discuss any questions you have with your health care provider.   Document Released: 10/01/2005 Document Revised: 02/15/2015 Document Reviewed: 01/13/2009 Elsevier Interactive Patient Education 2016 [employer].    IF you received an x-ray today, you will receive an invoice from [facility]. Please contact [facility] at [phone] with questions or concerns regarding your invoice.   IF you received labwork today, you will receive an invoice from [employer]. Please contact [facility] at [phone] with questions or concerns regarding your invoice.   Our billing staff will not be able to assist you with questions regarding bills from these companies.  You will be contacted with the lab results as soon as they are available. The fastest way to get your results is to activate your My Chart account. Instructions are located on the last page of this paperwork. If you have not heard from [place] regarding the results in 2 weeks, please contact this office.        I personally performed the services described in this documentation, which was scribed in my presence. The recorded information has been reviewed and considered, and addended by me as needed.   Signed,   Merri Ray, MD [facility].  06/13/16 5:34 PM

## 2016-06-22 ENCOUNTER — Telehealth: Payer: Self-pay

## 2016-06-22 NOTE — Telephone Encounter (Signed)
Patient last saw Dr. Carlota Raspberry on 8/30, stated that he had asked her to follow up & she was told to call rather than set up an apt. She is seeing her Orthopedic dr on 9/12. Would like to know if she needs to follow up with an apt. Was also inquiring about her labs.  Please advise. [phone]

## 2016-06-26 ENCOUNTER — Encounter: Payer: Self-pay | Admitting: *Deleted

## 2016-06-26 DIAGNOSIS — M5441 Lumbago with sciatica, right side: Secondary | ICD-10-CM | POA: Diagnosis not present

## 2016-06-26 DIAGNOSIS — M25551 Pain in right hip: Secondary | ICD-10-CM | POA: Diagnosis not present

## 2016-06-26 NOTE — Telephone Encounter (Signed)
Electrolytes overall in normal range, except borderline elevated blood sugar. Cholesterol, liver tests, muscle enzyme tests were normal. Let me know if there are any questions.         Dr. Carlota Raspberry I will let her know her lab results but does she have to follow up.

## 2016-06-27 NOTE — Telephone Encounter (Signed)
Based on our last visit, plan to follow-up in one week if her pain was not improved, but if she is seeing an orthopedist, that may not be necessary. Also asked to follow-up at some point if we are going to be providing her primary care as I just temporarily refilled her medications.

## 2016-06-29 LAB — GLUCOSE, POCT (MANUAL RESULT ENTRY): POC Glucose: 122 mg/dl — AB (ref 70–99)

## 2016-07-02 ENCOUNTER — Other Ambulatory Visit: Payer: Self-pay | Admitting: Family Medicine

## 2016-07-02 DIAGNOSIS — Z23 Encounter for immunization: Secondary | ICD-10-CM | POA: Diagnosis not present

## 2016-07-02 DIAGNOSIS — Z1231 Encounter for screening mammogram for malignant neoplasm of breast: Secondary | ICD-10-CM

## 2016-07-02 NOTE — Telephone Encounter (Signed)
Tried to call pt but VM is not set up.

## 2016-07-02 NOTE — Telephone Encounter (Signed)
Called pt back and gave her message from Dr Carlota Raspberry. Pt has seen ortho and they gave her a cortisone shot and put her on pred and she will f/up in 1 mos if not significantly improved.

## 2016-07-17 DIAGNOSIS — M25551 Pain in right hip: Secondary | ICD-10-CM | POA: Diagnosis not present

## 2016-07-17 DIAGNOSIS — M545 Low back pain: Secondary | ICD-10-CM | POA: Diagnosis not present

## 2016-07-17 DIAGNOSIS — Z23 Encounter for immunization: Secondary | ICD-10-CM | POA: Diagnosis not present

## 2016-07-17 DIAGNOSIS — M5441 Lumbago with sciatica, right side: Secondary | ICD-10-CM | POA: Diagnosis not present

## 2016-07-25 ENCOUNTER — Other Ambulatory Visit: Payer: Self-pay | Admitting: Family Medicine

## 2016-07-25 ENCOUNTER — Ambulatory Visit
Admission: RE | Admit: 2016-07-25 | Discharge: 2016-07-25 | Disposition: A | Discharge status: Home / Self Care | Payer: Medicare Other | Source: Ambulatory Visit | Attending: Family Medicine | Admitting: Family Medicine

## 2016-07-25 DIAGNOSIS — Z1231 Encounter for screening mammogram for malignant neoplasm of breast: Secondary | ICD-10-CM

## 2016-07-26 DIAGNOSIS — M545 Low back pain: Secondary | ICD-10-CM | POA: Diagnosis not present

## 2016-08-03 ENCOUNTER — Other Ambulatory Visit: Payer: Self-pay | Admitting: Family Medicine

## 2016-08-03 DIAGNOSIS — R928 Other abnormal and inconclusive findings on diagnostic imaging of breast: Secondary | ICD-10-CM

## 2016-08-06 DIAGNOSIS — M545 Low back pain: Secondary | ICD-10-CM | POA: Diagnosis not present

## 2016-08-06 DIAGNOSIS — M5441 Lumbago with sciatica, right side: Secondary | ICD-10-CM | POA: Diagnosis not present

## 2016-08-07 ENCOUNTER — Ambulatory Visit
Admission: RE | Admit: 2016-08-07 | Discharge: 2016-08-07 | Disposition: A | Discharge status: Home / Self Care | Payer: Medicare Other | Source: Ambulatory Visit | Attending: Family Medicine | Admitting: Family Medicine

## 2016-08-07 ENCOUNTER — Other Ambulatory Visit: Payer: Self-pay | Admitting: Family Medicine

## 2016-08-07 DIAGNOSIS — R928 Other abnormal and inconclusive findings on diagnostic imaging of breast: Secondary | ICD-10-CM

## 2016-08-07 DIAGNOSIS — R922 Inconclusive mammogram: Secondary | ICD-10-CM | POA: Diagnosis not present

## 2016-09-03 DIAGNOSIS — M5441 Lumbago with sciatica, right side: Secondary | ICD-10-CM | POA: Diagnosis not present

## 2016-09-03 DIAGNOSIS — M545 Low back pain: Secondary | ICD-10-CM | POA: Diagnosis not present

## 2016-11-29 ENCOUNTER — Other Ambulatory Visit: Payer: Self-pay | Admitting: Family Medicine

## 2016-11-29 DIAGNOSIS — E785 Hyperlipidemia, unspecified: Secondary | ICD-10-CM

## 2016-11-29 DIAGNOSIS — I10 Essential (primary) hypertension: Secondary | ICD-10-CM

## 2016-12-11 ENCOUNTER — Ambulatory Visit: Payer: Medicare Other

## 2016-12-16 ENCOUNTER — Encounter (HOSPITAL_BASED_OUTPATIENT_CLINIC_OR_DEPARTMENT_OTHER): Payer: Self-pay | Admitting: Emergency Medicine

## 2016-12-16 ENCOUNTER — Encounter (HOSPITAL_BASED_OUTPATIENT_CLINIC_OR_DEPARTMENT_OTHER): Payer: Self-pay | Admitting: *Deleted

## 2016-12-16 ENCOUNTER — Emergency Department (HOSPITAL_BASED_OUTPATIENT_CLINIC_OR_DEPARTMENT_OTHER)
Admission: EM | Admit: 2016-12-16 | Discharge: 2016-12-16 | Disposition: A | Discharge status: Home / Self Care | Payer: Medicare Other | Attending: Emergency Medicine | Admitting: Emergency Medicine

## 2016-12-16 ENCOUNTER — Emergency Department (HOSPITAL_BASED_OUTPATIENT_CLINIC_OR_DEPARTMENT_OTHER): Payer: Medicare Other

## 2016-12-16 ENCOUNTER — Emergency Department (HOSPITAL_BASED_OUTPATIENT_CLINIC_OR_DEPARTMENT_OTHER)
Admission: EM | Admit: 2016-12-16 | Discharge: 2016-12-16 | Disposition: A | Discharge status: Home / Self Care | Payer: Medicare Other | Source: Home / Self Care | Attending: Emergency Medicine | Admitting: Emergency Medicine

## 2016-12-16 DIAGNOSIS — R197 Diarrhea, unspecified: Secondary | ICD-10-CM | POA: Diagnosis not present

## 2016-12-16 DIAGNOSIS — K529 Noninfective gastroenteritis and colitis, unspecified: Secondary | ICD-10-CM | POA: Diagnosis not present

## 2016-12-16 DIAGNOSIS — I10 Essential (primary) hypertension: Secondary | ICD-10-CM | POA: Insufficient documentation

## 2016-12-16 DIAGNOSIS — Z79899 Other long term (current) drug therapy: Secondary | ICD-10-CM

## 2016-12-16 DIAGNOSIS — N390 Urinary tract infection, site not specified: Secondary | ICD-10-CM | POA: Diagnosis not present

## 2016-12-16 DIAGNOSIS — A084 Viral intestinal infection, unspecified: Secondary | ICD-10-CM | POA: Insufficient documentation

## 2016-12-16 DIAGNOSIS — Z791 Long term (current) use of non-steroidal anti-inflammatories (NSAID): Secondary | ICD-10-CM

## 2016-12-16 DIAGNOSIS — R112 Nausea with vomiting, unspecified: Secondary | ICD-10-CM | POA: Diagnosis not present

## 2016-12-16 LAB — CBC
HCT: 39.1 % (ref 36.0–46.0)
Hemoglobin: 13.2 g/dL (ref 12.0–15.0)
MCH: 32.7 pg (ref 26.0–34.0)
MCHC: 33.8 g/dL (ref 30.0–36.0)
MCV: 96.8 fL (ref 78.0–100.0)
Platelets: 245 10*3/uL (ref 150–400)
RBC: 4.04 MIL/uL (ref 3.87–5.11)
RDW: 12 % (ref 11.5–15.5)
WBC: 17.9 10*3/uL — ABNORMAL HIGH (ref 4.0–10.5)

## 2016-12-16 LAB — CBC WITH DIFFERENTIAL/PLATELET
Basophils Absolute: 0 10*3/uL (ref 0.0–0.1)
Basophils Relative: 0 %
Eosinophils Absolute: 0 10*3/uL (ref 0.0–0.7)
Eosinophils Relative: 0 %
HCT: 36.4 % (ref 36.0–46.0)
Hemoglobin: 12.3 g/dL (ref 12.0–15.0)
Lymphocytes Relative: 5 %
Lymphs Abs: 0.7 10*3/uL (ref 0.7–4.0)
MCH: 32.3 pg (ref 26.0–34.0)
MCHC: 33.8 g/dL (ref 30.0–36.0)
MCV: 95.5 fL (ref 78.0–100.0)
Monocytes Absolute: 0.6 10*3/uL (ref 0.1–1.0)
Monocytes Relative: 4 %
Neutro Abs: 13.2 10*3/uL — ABNORMAL HIGH (ref 1.7–7.7)
Neutrophils Relative %: 91 %
Platelets: 326 10*3/uL (ref 150–400)
RBC: 3.81 MIL/uL — ABNORMAL LOW (ref 3.87–5.11)
RDW: 12 % (ref 11.5–15.5)
WBC: 14.5 10*3/uL — ABNORMAL HIGH (ref 4.0–10.5)

## 2016-12-16 LAB — COMPREHENSIVE METABOLIC PANEL
ALT: 19 U/L (ref 14–54)
AST: 31 U/L (ref 15–41)
Albumin: 4.5 g/dL (ref 3.5–5.0)
Alkaline Phosphatase: 47 U/L (ref 38–126)
Anion gap: 13 (ref 5–15)
BUN: 32 mg/dL — ABNORMAL HIGH (ref 6–20)
CO2: 19 mmol/L — ABNORMAL LOW (ref 22–32)
Calcium: 9.2 mg/dL (ref 8.9–10.3)
Chloride: 108 mmol/L (ref 101–111)
Creatinine, Ser: 0.95 mg/dL (ref 0.44–1.00)
GFR calc Af Amer: >60 mL/min (ref >60)
GFR calc non Af Amer: 59 mL/min — ABNORMAL LOW (ref >60)
Glucose, Bld: 135 mg/dL — ABNORMAL HIGH (ref 65–99)
Potassium: 3.6 mmol/L (ref 3.5–5.1)
Sodium: 140 mmol/L (ref 135–145)
Total Bilirubin: 0.6 mg/dL (ref 0.3–1.2)
Total Protein: 7.3 g/dL (ref 6.5–8.1)

## 2016-12-16 LAB — URINALYSIS, MICROSCOPIC (REFLEX)

## 2016-12-16 LAB — BASIC METABOLIC PANEL
Anion gap: 10 (ref 5–15)
BUN: 28 mg/dL — ABNORMAL HIGH (ref 6–20)
CO2: 23 mmol/L (ref 22–32)
Calcium: 8.8 mg/dL — ABNORMAL LOW (ref 8.9–10.3)
Chloride: 105 mmol/L (ref 101–111)
Creatinine, Ser: 0.71 mg/dL (ref 0.44–1.00)
GFR calc Af Amer: >60 mL/min (ref >60)
GFR calc non Af Amer: >60 mL/min (ref >60)
Glucose, Bld: 129 mg/dL — ABNORMAL HIGH (ref 65–99)
Potassium: 3.2 mmol/L — ABNORMAL LOW (ref 3.5–5.1)
Sodium: 138 mmol/L (ref 135–145)

## 2016-12-16 LAB — URINALYSIS, ROUTINE W REFLEX MICROSCOPIC
Glucose, UA: NEGATIVE mg/dL
Hgb urine dipstick: NEGATIVE
Ketones, ur: NEGATIVE mg/dL
Nitrite: NEGATIVE
Protein, ur: NEGATIVE mg/dL
Specific Gravity, Urine: 1.022 (ref 1.005–1.030)
pH: 5 (ref 5.0–8.0)

## 2016-12-16 LAB — LIPASE, BLOOD: Lipase: 33 U/L (ref 11–51)

## 2016-12-16 MED ORDER — POTASSIUM CHLORIDE CRYS ER 20 MEQ PO TBCR
20.0000 meq | EXTENDED_RELEASE_TABLET | Freq: Once | ORAL | Status: AC
  Administered 2016-12-16: 20 meq via ORAL
  Filled 2016-12-16: qty 1

## 2016-12-16 MED ORDER — PROMETHAZINE HCL 25 MG PO TABS: 25.0000 mg | ORAL_TABLET | Freq: Three times a day (TID) | ORAL | 0 refills | Status: DC | PRN

## 2016-12-16 MED ORDER — ONDANSETRON HCL 4 MG/2ML IJ SOLN
4.0000 mg | Freq: Once | INTRAMUSCULAR | Status: AC
  Administered 2016-12-16: 4 mg via INTRAVENOUS
  Filled 2016-12-16: qty 2

## 2016-12-16 MED ORDER — FAMOTIDINE IN NACL 20-0.9 MG/50ML-% IV SOLN
20.0000 mg | Freq: Once | INTRAVENOUS | Status: AC
  Administered 2016-12-16: 20 mg via INTRAVENOUS
  Filled 2016-12-16: qty 50

## 2016-12-16 MED ORDER — SODIUM CHLORIDE 0.9 % IV BOLUS (SEPSIS): 500.0000 mL | Freq: Once | INTRAVENOUS | Status: DC

## 2016-12-16 MED ORDER — METOCLOPRAMIDE HCL 5 MG/ML IJ SOLN
10.0000 mg | Freq: Once | INTRAMUSCULAR | Status: AC
  Administered 2016-12-16: 10 mg via INTRAVENOUS
  Filled 2016-12-16: qty 2

## 2016-12-16 MED ORDER — DEXTROSE 5 % IV SOLN
1.0000 g | Freq: Once | INTRAVENOUS | Status: AC
  Administered 2016-12-16: 1 g via INTRAVENOUS
  Filled 2016-12-16: qty 10

## 2016-12-16 MED ORDER — PROMETHAZINE HCL 25 MG/ML IJ SOLN
INTRAMUSCULAR | Status: AC
  Filled 2016-12-16: qty 1

## 2016-12-16 MED ORDER — SODIUM CHLORIDE 0.9 % IV BOLUS (SEPSIS)
1000.0000 mL | Freq: Once | INTRAVENOUS | Status: AC
  Administered 2016-12-16: 1000 mL via INTRAVENOUS

## 2016-12-16 MED ORDER — PROMETHAZINE HCL 25 MG/ML IJ SOLN: 6.2500 mg | Freq: Once | INTRAMUSCULAR | Status: DC

## 2016-12-16 MED ORDER — ONDANSETRON HCL 4 MG/2ML IJ SOLN
4.0000 mg | Freq: Once | INTRAMUSCULAR | Status: AC | PRN
  Administered 2016-12-16: 4 mg via INTRAVENOUS
  Filled 2016-12-16: qty 2

## 2016-12-16 MED ORDER — ONDANSETRON 8 MG PO TBDP: ORAL_TABLET | ORAL | 0 refills | Status: DC

## 2016-12-16 MED ORDER — ACETAMINOPHEN 325 MG PO TABS
650.0000 mg | ORAL_TABLET | Freq: Once | ORAL | Status: AC
  Administered 2016-12-16: 650 mg via ORAL
  Filled 2016-12-16: qty 2

## 2016-12-16 MED ORDER — CEPHALEXIN 500 MG PO CAPS: 500.0000 mg | ORAL_CAPSULE | Freq: Four times a day (QID) | ORAL | 0 refills | Status: DC

## 2016-12-16 MED ORDER — PROMETHAZINE HCL 25 MG/ML IJ SOLN
12.5000 mg | Freq: Once | INTRAMUSCULAR | Status: AC
  Administered 2016-12-16: 12.5 mg via INTRAVENOUS

## 2016-12-16 NOTE — ED Notes (Signed)
Pt given [place] for fluid challenge. Able to drink without nausea or vomiting.

## 2016-12-16 NOTE — ED Provider Notes (Signed)
[facility] Provider Note   CSN: [id number] Arrival date & time: 12/16/16  0031     History   Chief Complaint Chief Complaint  Patient presents with  . Emesis    HPI [name] is a 70 y.o. female.  Patient is a 71 year old female with history of hypertension, GERD. She presents for evaluation of nausea, vomiting, diarrhea that started earlier this afternoon. She denies any bloody stools. She denies any fevers. She does report several other family members that have been ill in a similar fashion.   The history is provided by the patient.  Emesis   This is a new problem. The current episode started 3 to 5 hours ago. The problem occurs continuously. The problem has not changed since onset.The emesis has an appearance of stomach contents. There has been no fever. Associated symptoms include diarrhea. Pertinent negatives include no abdominal pain, no chills and no fever.    Past Medical History:  Diagnosis Date  . Arthritis   . GERD (gastroesophageal reflux disease)    at times  . Hyperlipidemia   . Hypertension   . Peripheral vascular disease ([place])    has spider veins  . Pneumothorax, traumatic    RIGHT; associated with multiple rib fractures after a fall    Patient Active Problem List   Diagnosis Date Noted  . Cough variant asthma 04/04/2016  . Upper airway cough syndrome 04/04/2016  . 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 ABDOMINAL HYSTERECTOMY  1982  . 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 BREAST SURGERY    . JOINT REPLACEMENT  2009   right knee  . JOINT REPLACEMENT  2011   left knee  . MASTECTOMY  2008   right breast  . SHOULDER ARTHROSCOPY DISTAL CLAVICLE EXCISION AND OPEN ROTATOR CUFF REPAIR  2011  . TOTAL SHOULDER ARTHROPLASTY  03/14/2012   Procedure: TOTAL SHOULDER ARTHROPLASTY;  Surgeon: Alta Corning, MD;  Location: [facility];  Service: Orthopedics;  Laterality: Left;    OB  History    No data available       Home Medications    Prior to Admission medications   Medication Sig Start Date End Date Taking? Authorizing Provider  acetaminophen (TYLENOL) 325 MG tablet Take 650 mg by mouth every 6 (six) hours as needed.    Historical Provider, MD  albuterol (PROAIR HFA) 108 (90 Base) MCG/ACT inhaler Inhale 2 puffs into the lungs every 6 (six) hours as needed for wheezing or shortness of breath.    Historical Provider, MD  atorvastatin (LIPITOR) 20 MG tablet take 1 tablet by mouth once daily 11/29/16   [name], MD  diphenhydrAMINE (SOMINEX) 25 MG tablet Take 50 mg by mouth at bedtime as needed for sleep.    Historical Provider, MD  fenofibrate (TRICOR) 145 MG tablet take 1 tablet by mouth once daily 11/29/16   [name], MD  Guaifenesin [facility] MAXIMUM STRENGTH) 1200 MG TB12 Take 1 tablet (1,200 mg total) by mouth every 12 (twelve) hours as needed. 11/28/15   Chelle Jeffery, PA-C  ibuprofen (ADVIL,MOTRIN) 100 MG tablet Take 100 mg by mouth every 6 (six) hours as needed.    Historical Provider, MD  mometasone-formoterol (DULERA) 100-5 MCG/ACT AERO Take 2 puffs first thing in am and then another 2 puffs about 12 hours later. 04/04/16   Tanda Rockers, MD  Multiple Vitamin (MULTIVITAMIN) capsule Take 1  capsule by mouth daily.    Historical Provider, MD  olmesartan-hydrochlorothiazide (BENICAR HCT) 20-12.5 MG tablet take 1 tablet by mouth once daily 11/29/16   [name], MD  oxymetazoline (AFRIN) 0.05 % nasal spray Place 1 spray into both nostrils Nightly.    Historical Provider, MD    Family History Family History  Problem Relation Age of Onset  . Breast cancer Sister   . Breast cancer Paternal Grandmother   . Anesthesia problems Neg Hx   . Hypotension Neg Hx   . Malignant hyperthermia Neg Hx   . Pseudochol deficiency Neg Hx     Social History Social History  Substance Use Topics  . Smoking status: Never Smoker  . Smokeless tobacco: Never  Used  . Alcohol use No     Allergies   Dilaudid [hydromorphone hcl] and Morphine and related   Review of Systems Review of Systems  Constitutional: Negative for chills and fever.  Gastrointestinal: Positive for diarrhea and vomiting. Negative for abdominal pain.  All other systems reviewed and are negative.    Physical Exam Updated Vital Signs BP (!) 122/54 (BP Location: Right Arm)   Pulse 86   Temp 97.9 F (36.6 C) (Oral)   Resp 20   Ht 5\' 5"  (1.651 m)   Wt 180 lb (81.6 kg)   SpO2 97%   BMI 29.95 kg/m   Physical Exam  Constitutional: She is oriented to person, place, and time. She appears well-developed and well-nourished. No distress.  HENT:  Head: Normocephalic and atraumatic.  Mouth/Throat: Oropharynx is clear and moist.  Neck: Normal range of motion. Neck supple.  Cardiovascular: Normal rate and regular rhythm.  Exam reveals no gallop and no friction rub.   No murmur heard. Pulmonary/Chest: Effort normal and breath sounds normal. No respiratory distress. She has no wheezes. She has no rales.  Abdominal: Soft. Bowel sounds are normal. She exhibits no distension. There is no tenderness.  Musculoskeletal: Normal range of motion.  Neurological: She is alert and oriented to person, place, and time.  Skin: Skin is warm and dry. She is not diaphoretic.  Nursing note and vitals reviewed.    ED Treatments / Results  Labs (all labs ordered are listed, but only abnormal results are displayed) Labs Reviewed  LIPASE, BLOOD  COMPREHENSIVE METABOLIC PANEL  CBC  URINALYSIS, ROUTINE W REFLEX MICROSCOPIC    EKG  EKG Interpretation None       Radiology No results found.  Procedures Procedures (including critical care time)  Medications Ordered in ED Medications  ondansetron (ZOFRAN) injection 4 mg (4 mg Intravenous Given 12/16/16 0136)     Initial Impression / Assessment and Plan / ED Course  I have reviewed the triage vital signs and the nursing  notes.  Pertinent labs & imaging results that were available during my care of the patient were reviewed by me and considered in my medical decision making (see chart for details).  Patient presents with nausea, vomiting, diarrhea that started earlier this afternoon. Her presentation, physical examination, and workup are all consistent with a viral gastroenteritis. She was given IV fluids and medications and is feeling better. She is now tolerating by mouth with no difficulty. She will be discharged with Zofran and return precautions.  Final Clinical Impressions(s) / ED Diagnoses   Final diagnoses:  None    New Prescriptions New Prescriptions   No medications on file     Veryl Speak, MD 12/16/16 [phone]

## 2016-12-16 NOTE — Discharge Instructions (Signed)
It was our pleasure to provide your ER care today - we hope that you feel better.  Rest. Drink plenty of fluids.  Take keflex (antibiotic) as prescribed, for suspected urine infection.  You may take phenergan as need for nausea - may make drowsy, no driving when taking.  Follow up with primary care doctor in the next 1-2 days if symptoms fail to improve/resolve.  Return to ER if worse, persistent vomiting, new or severe abdominal pain, other concern.   You were given medication in the ER that causes drowsiness - no driving for the next 6 hours.

## 2016-12-16 NOTE — ED Notes (Signed)
Pt given d/c instructions as per chart. Rx x 2. Verbalizes understanding. No questions. IV d/c intact.

## 2016-12-16 NOTE — ED Notes (Signed)
ED Provider at bedside. 

## 2016-12-16 NOTE — Discharge Instructions (Signed)
Zofran is prescribed as needed for nausea.  Clear liquid diet for the next 12 hours, then slowly advance to normal.  Return to the emergency department if you develop severe abdominal pain, high fevers, bloody stools, or other new concerning symptoms.

## 2016-12-16 NOTE — ED Notes (Signed)
Pt ambulated to RR without assistance, only stand-by. In NAD

## 2016-12-16 NOTE — ED Triage Notes (Signed)
Pt reports nausea, vomiting and watery diarrhea with "too many to count" episodes since yesterday.  Pt reports seen here last night for same, dc'd home with Rx for zofran ODT, which has not been working.  Grandson in ED with her.

## 2016-12-16 NOTE — ED Notes (Signed)
Patient is dry heaving in the room again

## 2016-12-16 NOTE — ED Provider Notes (Addendum)
[facility] Provider Note   CSN: [id number] Arrival date & time: 12/16/16  1407   By signing my name below, I, Eunice Blase, attest that this documentation has been prepared under the direction and in the presence of Lajean Saver, MD. Electronically signed, Eunice Blase, ED Scribe. 12/16/16. 3:49 PM.   History   Chief Complaint Chief Complaint  Patient presents with  . Emesis   The history is provided by the patient and medical records. No language interpreter was used.    HPI Comments: [name] is a 71 y.o. female who presents to the Emergency Department complaining of N/V/D x 2 days. She notes diarrhea came on first with multiple episodes that are watery now. She adds she is dry heaving. Hx of hysterectomy noted. She adds she was seen for the same last night at [facility]. She states she had labs done and she was prescribed zofran without relief. Pt denies cough, blood in vomit or stool, abdominal pain, dysuria, hematuria, urinary frequency change, headaches, body aches, chest pain, SOB, cough, congestion and rhinorrhea.   No recent abx use. Family w recent similar symptoms. No abd pain. No gu c/o.      Past Medical History:  Diagnosis Date  . Arthritis   . GERD (gastroesophageal reflux disease)    at times  . Hyperlipidemia   . Hypertension   . Peripheral vascular disease ([place])    has spider veins  . Pneumothorax, traumatic    RIGHT; associated with multiple rib fractures after a fall    Patient Active Problem List   Diagnosis Date Noted  . Cough variant asthma 04/04/2016  . Upper airway cough syndrome 04/04/2016  . 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 ABDOMINAL HYSTERECTOMY  1982  . 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 BREAST SURGERY    . JOINT REPLACEMENT  2009   right knee  . JOINT REPLACEMENT  2011   left knee  . MASTECTOMY  2008   right breast  . SHOULDER ARTHROSCOPY  DISTAL CLAVICLE EXCISION AND OPEN ROTATOR CUFF REPAIR  2011  . TOTAL SHOULDER ARTHROPLASTY  03/14/2012   Procedure: TOTAL SHOULDER ARTHROPLASTY;  Surgeon: Alta Corning, MD;  Location: [facility];  Service: Orthopedics;  Laterality: Left;    OB History    No data available       Home Medications    Prior to Admission medications   Medication Sig Start Date End Date Taking? Authorizing Provider  acetaminophen (TYLENOL) 325 MG tablet Take 650 mg by mouth every 6 (six) hours as needed.    Historical Provider, MD  albuterol (PROAIR HFA) 108 (90 Base) MCG/ACT inhaler Inhale 2 puffs into the lungs every 6 (six) hours as needed for wheezing or shortness of breath.    Historical Provider, MD  atorvastatin (LIPITOR) 20 MG tablet take 1 tablet by mouth once daily 11/29/16   [name], MD  diphenhydrAMINE (SOMINEX) 25 MG tablet Take 50 mg by mouth at bedtime as needed for sleep.    Historical Provider, MD  fenofibrate (TRICOR) 145 MG tablet take 1 tablet by mouth once daily 11/29/16   [name], MD  Guaifenesin [facility] MAXIMUM STRENGTH) 1200 MG TB12 Take 1 tablet (1,200 mg total) by mouth every 12 (twelve) hours as needed. 11/28/15   Chelle Jeffery, PA-C  ibuprofen (ADVIL,MOTRIN) 100 MG tablet Take 100 mg by mouth every 6 (six)  hours as needed.    Historical Provider, MD  mometasone-formoterol (DULERA) 100-5 MCG/ACT AERO Take 2 puffs first thing in am and then another 2 puffs about 12 hours later. 04/04/16   Tanda Rockers, MD  Multiple Vitamin (MULTIVITAMIN) capsule Take 1 capsule by mouth daily.    Historical Provider, MD  olmesartan-hydrochlorothiazide (BENICAR HCT) 20-12.5 MG tablet take 1 tablet by mouth once daily 11/29/16   [name], MD  ondansetron (ZOFRAN ODT) 8 MG disintegrating tablet 8mg  ODT q4 hours prn nausea 12/16/16   Veryl Speak, MD  oxymetazoline (AFRIN) 0.05 % nasal spray Place 1 spray into both nostrils Nightly.    Historical Provider, MD    Family History Family  History  Problem Relation Age of Onset  . Breast cancer Sister   . Breast cancer Paternal Grandmother   . Anesthesia problems Neg Hx   . Hypotension Neg Hx   . Malignant hyperthermia Neg Hx   . Pseudochol deficiency Neg Hx     Social History Social History  Substance Use Topics  . Smoking status: Never Smoker  . Smokeless tobacco: Never Used  . Alcohol use No     Allergies   Dilaudid [hydromorphone hcl] and Morphine and related   Review of Systems Review of Systems  Constitutional: Positive for fever.  HENT: Negative for congestion, rhinorrhea and sore throat.   Eyes: Negative for redness.  Respiratory: Negative for cough, chest tightness and shortness of breath.   Cardiovascular: Negative for chest pain and leg swelling.  Gastrointestinal: Positive for diarrhea, nausea and vomiting. Negative for abdominal pain.  Genitourinary: Negative for dysuria, frequency and hematuria.  Musculoskeletal: Negative for arthralgias and myalgias.  Skin: Negative for rash.  Neurological: Negative for headaches.     Physical Exam Updated Vital Signs BP 153/74 (BP Location: Left Arm)   Pulse (!) 122   Temp 100.7 F (38.2 C) (Oral)   Resp 24   Ht 5\' 5"  (1.651 m)   Wt 180 lb (81.6 kg)   SpO2 92%   BMI 29.95 kg/m   Physical Exam  Constitutional: She appears well-developed and well-nourished. No distress.  HENT:  Head: Normocephalic and atraumatic.  Mouth/Throat: Oropharynx is clear and moist.  Eyes: Conjunctivae are normal. No scleral icterus.  Neck: Normal range of motion. Neck supple.  Cardiovascular: Normal rate, regular rhythm, normal heart sounds and intact distal pulses.  Exam reveals no gallop and no friction rub.   No murmur heard. Pulmonary/Chest: Effort normal and breath sounds normal. She has no wheezes. She has no rales.  Abdominal: Soft. Bowel sounds are normal. She exhibits no distension. There is no tenderness.  Genitourinary:  Genitourinary Comments: No cva  tenderness  Musculoskeletal: She exhibits no edema or tenderness.  Lymphadenopathy:    She has no cervical adenopathy.  Neurological: She is alert.  Skin: Skin is warm and dry. No rash noted. She is not diaphoretic.  Psychiatric: She has a normal mood and affect. Her behavior is normal.  Nursing note and vitals reviewed.    ED Treatments / Results  DIAGNOSTIC STUDIES: Oxygen Saturation is 92% on RA, low by my interpretation.    COORDINATION OF CARE: 3:17 PM Discussed treatment plan with pt at bedside and pt agreed to plan. Will order fluids and medications.  Labs (all labs ordered are listed, but only abnormal results are displayed) Results for orders placed or performed during the hospital encounter of 12/16/16  CBC with Differential  Result Value Ref Range   WBC  14.5 (H) 4.0 - 10.5 K/uL   RBC 3.81 (L) 3.87 - 5.11 MIL/uL   Hemoglobin 12.3 12.0 - 15.0 g/dL   HCT 36.4 36.0 - 46.0 %   MCV 95.5 78.0 - 100.0 fL   MCH 32.3 26.0 - 34.0 pg   MCHC 33.8 30.0 - 36.0 g/dL   RDW 12.0 11.5 - 15.5 %   Platelets 326 150 - 400 K/uL   Neutrophils Relative % 91 %   Neutro Abs 13.2 (H) 1.7 - 7.7 K/uL   Lymphocytes Relative 5 %   Lymphs Abs 0.7 0.7 - 4.0 K/uL   Monocytes Relative 4 %   Monocytes Absolute 0.6 0.1 - 1.0 K/uL   Eosinophils Relative 0 %   Eosinophils Absolute 0.0 0.0 - 0.7 K/uL   Basophils Relative 0 %   Basophils Absolute 0.0 0.0 - 0.1 K/uL  Basic metabolic panel  Result Value Ref Range   Sodium 138 135 - 145 mmol/L   Potassium 3.2 (L) 3.5 - 5.1 mmol/L   Chloride 105 101 - 111 mmol/L   CO2 23 22 - 32 mmol/L   Glucose, Bld 129 (H) 65 - 99 mg/dL   BUN 28 (H) 6 - 20 mg/dL   Creatinine, Ser 0.71 0.44 - 1.00 mg/dL   Calcium 8.8 (L) 8.9 - 10.3 mg/dL   GFR calc non Af Amer >60 >60 mL/min   GFR calc Af Amer >60 >60 mL/min   Anion gap 10 5 - 15  Urinalysis, Routine w reflex microscopic  Result Value Ref Range   Color, Urine YELLOW YELLOW   APPearance CLOUDY (A) CLEAR    Specific Gravity, Urine 1.022 1.005 - 1.030   pH 5.0 5.0 - 8.0   Glucose, UA NEGATIVE NEGATIVE mg/dL   Hgb urine dipstick NEGATIVE NEGATIVE   Bilirubin Urine SMALL (A) NEGATIVE   Ketones, ur NEGATIVE NEGATIVE mg/dL   Protein, ur NEGATIVE NEGATIVE mg/dL   Nitrite NEGATIVE NEGATIVE   Leukocytes, UA SMALL (A) NEGATIVE  Urinalysis, Microscopic (reflex)  Result Value Ref Range   RBC / HPF 0-5 0 - 5 RBC/hpf   WBC, UA 6-30 0 - 5 WBC/hpf   Bacteria, UA MANY (A) NONE SEEN   Squamous Epithelial / LPF 0-5 (A) NONE SEEN   Mucous PRESENT    EKG  EKG Interpretation None       Radiology Dg Abd Acute W/chest  Result Date: 12/16/2016 CLINICAL DATA:  Nausea, vomiting and diarrhea x2 days EXAM: DG ABDOMEN ACUTE W/ 1V CHEST COMPARISON:  CXR 03/06/2016 FINDINGS: Heart is normal in size. Aorta slightly and coiled in appearance without aneurysm. There is mild aortic atherosclerosis. Old right-sided sixth through eighth lateral rib fractures. Left shoulder arthroplasty. Surgical clips project over the periphery of the right breast shadow. Minimal atelectasis at the lung bases. There is dextroconvex scoliosis of the mid lumbar spine. There is lower lumbar degenerative disc disease at L5-S1 with facet arthropathy. Bowel gas pattern is unremarkable without evidence of obstruction. No pneumoperitoneum. No suspicious calculi. Numerous phleboliths are seen in the pelvis bilaterally. IMPRESSION: Unremarkable bowel gas pattern. Bibasilar atelectasis without acute pulmonary disease. Dextroscoliosis of the lumbar spine with lower lumbar degenerative disc and facet arthropathy. Electronically Signed   By: Ashley Royalty M.D.   On: 12/16/2016 17:58    Procedures Procedures (including critical care time)  Medications Ordered in ED Medications  acetaminophen (TYLENOL) tablet 650 mg (not administered)  sodium chloride 0.9 % bolus 1,000 mL (not administered)  famotidine (PEPCID) IVPB 20  mg premix (not administered)    metoCLOPramide (REGLAN) injection 10 mg (not administered)  ondansetron (ZOFRAN) injection 4 mg (4 mg Intravenous Given 12/16/16 1448)     Initial Impression / Assessment and Plan / ED Course  I have reviewed the triage vital signs and the nursing notes.  Pertinent labs & imaging results that were available during my care of the patient were reviewed by me and considered in my medical decision making (see chart for details).  I personally performed the services described in this documentation, which was scribed in my presence. The recorded information has been reviewed and considered. Lajean Saver, MD  Iv ns bolus.  reglan iv. pepcid iv.   Trial of po fluids.  Pt tolerates po.    Pt has no abd pain. abd is soft nt.  Recurrent nv.  Added xrays.  xrays neg acute.  Pt requests phenergan. Phenergan iv.    Recheck resolution of nausea.    Suspected uti on labs. Rocephin iv.   Pt tolerating po.  abd soft nt, no pain.   Pt currently appears stable for d/c.     Final Clinical Impressions(s) / ED Diagnoses   Final diagnoses:  None    New Prescriptions New Prescriptions   No medications on file        Lajean Saver, MD 12/16/16 [phone]

## 2016-12-16 NOTE — ED Triage Notes (Signed)
Pt states she ate an egg sandwich at around 5-5:30p. Began having N/V/D shortly after. Numerous episodes of each. Two other family members had similar s/s.

## 2016-12-18 LAB — URINE CULTURE: Culture: <10000 — AB

## 2016-12-31 ENCOUNTER — Ambulatory Visit (INDEPENDENT_AMBULATORY_CARE_PROVIDER_SITE_OTHER): Payer: Medicare Other | Admitting: Family Medicine

## 2016-12-31 VITALS — BP 124/66 | HR 78 | Temp 98.4°F | Resp 16 | Ht 65.0 in | Wt 179.6 lb

## 2016-12-31 DIAGNOSIS — Z13820 Encounter for screening for osteoporosis: Secondary | ICD-10-CM

## 2016-12-31 DIAGNOSIS — H8110 Benign paroxysmal vertigo, unspecified ear: Secondary | ICD-10-CM | POA: Diagnosis not present

## 2016-12-31 DIAGNOSIS — E785 Hyperlipidemia, unspecified: Secondary | ICD-10-CM | POA: Diagnosis not present

## 2016-12-31 DIAGNOSIS — Z0000 Encounter for general adult medical examination without abnormal findings: Secondary | ICD-10-CM | POA: Diagnosis not present

## 2016-12-31 DIAGNOSIS — E876 Hypokalemia: Secondary | ICD-10-CM

## 2016-12-31 DIAGNOSIS — E2839 Other primary ovarian failure: Secondary | ICD-10-CM

## 2016-12-31 DIAGNOSIS — R1030 Lower abdominal pain, unspecified: Secondary | ICD-10-CM | POA: Diagnosis not present

## 2016-12-31 DIAGNOSIS — Z1159 Encounter for screening for other viral diseases: Secondary | ICD-10-CM | POA: Diagnosis not present

## 2016-12-31 DIAGNOSIS — I10 Essential (primary) hypertension: Secondary | ICD-10-CM | POA: Diagnosis not present

## 2016-12-31 DIAGNOSIS — J4520 Mild intermittent asthma, uncomplicated: Secondary | ICD-10-CM | POA: Diagnosis not present

## 2016-12-31 DIAGNOSIS — R739 Hyperglycemia, unspecified: Secondary | ICD-10-CM | POA: Diagnosis not present

## 2016-12-31 LAB — POCT CBC
Granulocyte percent: 67.3 %G (ref 37–80)
HCT, POC: 35.9 % — AB (ref 37.7–47.9)
Hemoglobin: 12.7 g/dL (ref 12.2–16.2)
Lymph, poc: 2.2 (ref 0.6–3.4)
MCH, POC: 33.3 pg — AB (ref 27–31.2)
MCHC: 35.5 g/dL — AB (ref 31.8–35.4)
MCV: 93.8 fL (ref 80–97)
MID (cbc): 0.1 (ref 0–0.9)
MPV: 7.3 fL (ref 0–99.8)
POC Granulocyte: 4.9 (ref 2–6.9)
POC LYMPH PERCENT: 30.8 %L (ref 10–50)
POC MID %: 1.9 %M (ref 0–12)
Platelet Count, POC: 401 10*3/uL (ref 142–424)
RBC: 3.83 M/uL — AB (ref 4.04–5.48)
RDW, POC: 12.5 %
WBC: 7.3 10*3/uL (ref 4.6–10.2)

## 2016-12-31 LAB — POCT URINALYSIS DIP (MANUAL ENTRY)
Bilirubin, UA: NEGATIVE
Blood, UA: NEGATIVE
Glucose, UA: NEGATIVE
Ketones, POC UA: NEGATIVE
Leukocytes, UA: NEGATIVE
Nitrite, UA: NEGATIVE
Protein Ur, POC: NEGATIVE
Spec Grav, UA: 1.02 (ref 1.030–1.035)
Urobilinogen, UA: 0.2 (ref ?–2.0)
pH, UA: 5.5 (ref 5.0–8.0)

## 2016-12-31 LAB — POC MICROSCOPIC URINALYSIS (UMFC): Mucus: ABSENT

## 2016-12-31 MED ORDER — ATORVASTATIN CALCIUM 20 MG PO TABS: 20.0000 mg | ORAL_TABLET | Freq: Every day | ORAL | 1 refills | Status: DC

## 2016-12-31 MED ORDER — OLMESARTAN MEDOXOMIL-HCTZ 20-12.5 MG PO TABS: 1.0000 | ORAL_TABLET | Freq: Every day | ORAL | 1 refills | Status: DC

## 2016-12-31 MED ORDER — MECLIZINE HCL 25 MG PO TABS: 25.0000 mg | ORAL_TABLET | Freq: Three times a day (TID) | ORAL | 0 refills | Status: DC | PRN

## 2016-12-31 MED ORDER — ALBUTEROL SULFATE HFA 108 (90 BASE) MCG/ACT IN AERS: 2.0000 | INHALATION_SPRAY | Freq: Four times a day (QID) | RESPIRATORY_TRACT | 0 refills | Status: DC | PRN

## 2016-12-31 MED ORDER — FENOFIBRATE 145 MG PO TABS: 145.0000 mg | ORAL_TABLET | Freq: Every day | ORAL | 1 refills | Status: DC

## 2016-12-31 NOTE — Progress Notes (Signed)
By signing my name below, I, Mesha Guinyard, attest that this documentation has been prepared under the direction and in the presence of Merri Ray, MD.  Electronically Signed: Verlee Monte, Medical Scribe. 12/31/16. 10:59 AM.  Subjective:    Patient ID: [name], female    DOB: [birth year], 71 y.o.   MRN: [id number]  HPI Chief Complaint  Patient presents with  . Annual Exam    HPI Comments: [name] is a 71 y.o. female who presents to the Primary Care at [facility] and [facility] for her complete physical. She was seen March 4th in the ED for gastroenteritis but also treated with rocephin injection and keflex at home for UTI - urine culture was negative. Pt is fasting. Pt reports recieving a potassium tablet the last time she was in the hospital.  ROS: Reports fatigue with getting older. Sleep disturbance is treated with unisom. Congestion, wheezing, myalgias, arthralgias, and back pain all stable.  Lower Abdominal: Reports feeling intermittent lower abdominal pain since being treated for a UTI for the last 2-3 weeks. Denies fever, nausea, emesis, vaginal bleeding, diarrhea, and blood in the stool.  Benign Paroxysmal Vertigo: Reports dizzy spells that occur once in a while when she moves her head slightly, when she sits in the back of a car, or with flying. Was given meclizine for relief of her sxs.  HTN: He's on benicar HCT 20/12.5 mg QD. Pt is compliant with benicar. Lab Results  Component Value Date   CREATININE 0.71 12/16/2016   HLD: She takes lipitor 20 mg and tricor 145 mg. Pt is compliant with her medication and denies myalgias or other negative side effects from it. Lab Results  Component Value Date   CHOL 157 06/13/2016   HDL 73 06/13/2016   LDLCALC 68 06/13/2016   TRIG 80 06/13/2016   CHOLHDL 2.2 06/13/2016   Lab Results  Component Value Date   ALT 19 12/16/2016   AST 31 12/16/2016   ALKPHOS 47 12/16/2016   BILITOT 0.6 12/16/2016    Hyperglycemia: She's had a few elevated glucose readings. No dx for DM and not on any medication.  Asthma: Takes dulera 100/5 mcg/act, 2 puffs BID and proair HFA PRN. Her pulmonary is Dr. Mertha Finders. Pt no longer takes dulera and she now takes advair, 1 puff BID, instead due to financial concerns. Pt has to use her rescue inhaler once a month and she's out without refills.  Cancer Screening: Breast CA: Mammogram Oct 2017. No evidence of malignancy, repeat in 1 year. Cervical CA: Pap smear status post hysterectomy at 71 y/o due to high levels of CA cells, no true CA found. Colon CA: Colonoscopy Spring 2008 with Dr. Nevada Crane - by the breast center on [address].  Bone Density Testing: June 2011.  Immunizations: Pt had prevnar vaccine in 2015. Pneumovax as well as flu Aug 29, 2016 at [place]. Immunization History  Administered Date(s) Administered  . Influenza-Unspecified 08/15/2016  . Tdap 02/02/2014   Vision: Last saw an ophthalmologist last year with some glaucoma found, but was told it was nothing to worry about and she wasn't given any medication for it.  Visual Acuity Screening   Right eye Left eye Both eyes  Without correction:     With correction: 20/25 20/25 20/20    Dentist: Has all natural teeth and isn't followed recently by dentist. Hasn't been seen for the last 3 years.  Activity/Exercise: Pt walks her dog for an hour 2x a day.  Fall  Screening: Fall Risk  12/31/2016 06/13/2016 03/06/2016 03/12/2015  Falls in the past year? No No No Yes  Number falls in past yr: - - - 1  Injury with Fall? - - - No   Functional Status Survey: Is the patient deaf or have difficulty hearing?: No Does the patient have difficulty seeing, even when wearing glasses/contacts?: No Does the patient have difficulty concentrating, remembering, or making decisions?: No Does the patient have difficulty walking or climbing stairs?: No Does the patient have difficulty dressing or bathing?: No Does the  patient have difficulty doing errands alone such as visiting a doctor's office or shopping?: No  Advance Directives: Does not have advance directive, so was given paper work.  Hep C Screening: Pt has never had screening but agrees to screening.  Depression Screening: Depression screen [facility] 2/9 12/31/2016 06/13/2016 03/06/2016 11/28/2015 03/12/2015  Decreased Interest 0 0 0 0 0  Down, Depressed, Hopeless 0 0 0 0 0  PHQ - 2 Score 0 0 0 0 0    Patient Active Problem List   Diagnosis Date Noted  . Cough variant asthma 04/04/2016  . Upper airway cough syndrome 04/04/2016  . 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 Arthritis   . GERD (gastroesophageal reflux disease)    at times  . Hyperlipidemia   . Hypertension   . Peripheral vascular disease ([place])    has spider veins  . Pneumothorax, traumatic    RIGHT; associated with multiple rib fractures after a fall   Past Surgical History:  Procedure Laterality Date  . ABDOMINAL HYSTERECTOMY  1982  . 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 BREAST SURGERY    . JOINT REPLACEMENT  2009   right knee  . JOINT REPLACEMENT  2011   left knee  . MASTECTOMY  2008   right breast  . SHOULDER ARTHROSCOPY DISTAL CLAVICLE EXCISION AND OPEN ROTATOR CUFF REPAIR  2011  . TOTAL SHOULDER ARTHROPLASTY  03/14/2012   Procedure: TOTAL SHOULDER ARTHROPLASTY;  Surgeon: Alta Corning, MD;  Location: [facility];  Service: Orthopedics;  Laterality: Left;   Allergies  Allergen Reactions  . Dilaudid [Hydromorphone Hcl] Itching  . Morphine And Related Nausea And Vomiting   Prior to Admission medications   Medication Sig Start Date End Date Taking? Authorizing Provider  acetaminophen (TYLENOL) 325 MG tablet Take 650 mg by mouth every 6 (six) hours as needed.    Historical Provider, MD  albuterol (PROAIR HFA) 108 (90 Base) MCG/ACT inhaler Inhale 2 puffs into the lungs every 6 (six) hours as needed for wheezing or shortness of  breath.    Historical Provider, MD  atorvastatin (LIPITOR) 20 MG tablet take 1 tablet by mouth once daily 11/29/16   [name], MD  cephALEXin (KEFLEX) 500 MG capsule Take 1 capsule (500 mg total) by mouth 4 (four) times daily. 12/16/16   Lajean Saver, MD  diphenhydrAMINE (SOMINEX) 25 MG tablet Take 50 mg by mouth at bedtime as needed for sleep.    Historical Provider, MD  fenofibrate (TRICOR) 145 MG tablet take 1 tablet by mouth once daily 11/29/16   [name], MD  Guaifenesin [facility] MAXIMUM STRENGTH) 1200 MG TB12 Take 1 tablet (1,200 mg total) by mouth every 12 (twelve) hours as needed. 11/28/15   Chelle Jeffery, PA-C  ibuprofen (ADVIL,MOTRIN) 100 MG tablet Take 100 mg by mouth every 6 (six) hours as needed.    Historical Provider,  MD  mometasone-formoterol (DULERA) 100-5 MCG/ACT AERO Take 2 puffs first thing in am and then another 2 puffs about 12 hours later. 04/04/16   Tanda Rockers, MD  Multiple Vitamin (MULTIVITAMIN) capsule Take 1 capsule by mouth daily.    Historical Provider, MD  olmesartan-hydrochlorothiazide (BENICAR HCT) 20-12.5 MG tablet take 1 tablet by mouth once daily 11/29/16   [name], MD  ondansetron (ZOFRAN ODT) 8 MG disintegrating tablet 8mg  ODT q4 hours prn nausea 12/16/16   Veryl Speak, MD  oxymetazoline (AFRIN) 0.05 % nasal spray Place 1 spray into both nostrils Nightly.    Historical Provider, MD  promethazine (PHENERGAN) 25 MG tablet Take 1 tablet (25 mg total) by mouth every 8 (eight) hours as needed for nausea or vomiting. 12/16/16   Lajean Saver, MD   Social History   Social History  . Marital status: Divorced    Spouse name: N/A  . Number of children: 2  . Years of education: N/A   Occupational History  . Retired   . Machine Operator     [employer]   Social History Main Topics  . Smoking status: Never Smoker  . Smokeless tobacco: Never Used  . Alcohol use No  . Drug use: No  . Sexual activity: Not on file   Other Topics  Concern  . Not on file   Social History Narrative  . No narrative on file   Review of Systems  Constitutional: Positive for fatigue.  HENT: Positive for congestion.   Respiratory: Positive for wheezing.   Gastrointestinal: Positive for abdominal pain.  Musculoskeletal: Positive for arthralgias, back pain and myalgias.  Allergic/Immunologic: Positive for environmental allergies.  Psychiatric/Behavioral: Positive for sleep disturbance.   13 point ROS positive for the above, all chronic. Objective:  Physical Exam  Constitutional: She is oriented to person, place, and time. She appears well-developed and well-nourished.  HENT:  Head: Normocephalic and atraumatic.  Right Ear: External ear normal.  Left Ear: External ear normal.  Mouth/Throat: Oropharynx is clear and moist.  Eyes: Conjunctivae are normal. Pupils are equal, round, and reactive to light.  Neck: Normal range of motion. Neck supple. No thyromegaly present.  Cardiovascular: Normal rate, regular rhythm, normal heart sounds and intact distal pulses.   No murmur heard. Pulmonary/Chest: Effort normal and breath sounds normal. No respiratory distress. She has no wheezes.  Abdominal: Soft. Bowel sounds are normal. There is tenderness (minimal) in the right lower quadrant.  Musculoskeletal: Normal range of motion. She exhibits no edema or tenderness.  Lymphadenopathy:    She has no cervical adenopathy.  Neurological: She is alert and oriented to person, place, and time.  Skin: Skin is warm and dry. No rash noted.  Psychiatric: She has a normal mood and affect. Her behavior is normal. Thought content normal.    Vitals:   12/31/16 1019  BP: 124/66  Pulse: 78  Resp: 16  Temp: 98.4 F (36.9 C)  TempSrc: Oral  SpO2: 96%  Weight: 179 lb 9.6 oz (81.5 kg)  Height: 5\' 5"  (1.651 m)   Body mass index is 29.89 kg/m.   Results for orders placed or performed in visit on 12/31/16  POCT urinalysis dipstick  Result Value Ref Range     Color, UA yellow yellow   Clarity, UA clear clear   Glucose, UA negative negative   Bilirubin, UA negative negative   Ketones, POC UA negative negative   Spec Grav, UA 1.020 1.030 - 1.035   Blood, UA  negative negative   pH, UA 5.5 5.0 - 8.0   Protein Ur, POC negative negative   Urobilinogen, UA 0.2 Negative - 2.0   Nitrite, UA Negative Negative   Leukocytes, UA Negative Negative  POCT Microscopic Urinalysis ([facility])  Result Value Ref Range   WBC,UR,HPF,POC None None WBC/hpf   RBC,UR,HPF,POC None None RBC/hpf   Bacteria None None, Too numerous to count   Mucus Absent Absent   Epithelial Cells, UR Per Microscopy None None, Too numerous to count cells/hpf  POCT CBC  Result Value Ref Range   WBC 7.3 4.6 - 10.2 K/uL   Lymph, poc 2.2 0.6 - 3.4   POC LYMPH PERCENT 30.8 10 - 50 %L   MID (cbc) 0.1 0 - 0.9   POC MID % 1.9 0 - 12 %M   POC Granulocyte 4.9 2 - 6.9   Granulocyte percent 67.3 37 - 80 %G   RBC 3.83 (A) 4.04 - 5.48 M/uL   Hemoglobin 12.7 12.2 - 16.2 g/dL   HCT, POC 35.9 (A) 37.7 - 47.9 %   MCV 93.8 80 - 97 fL   MCH, POC 33.3 (A) 27 - 31.2 pg   MCHC 35.5 (A) 31.8 - 35.4 g/dL   RDW, POC 12.5 %   Platelet Count, POC 401 142 - 424 K/uL   MPV 7.3 0 - 99.8 fL   Assessment & Plan:    BEVAN VU is a 71 y.o. female Medicare annual wellness visit, subsequent  -  - anticipatory guidance as below in AVS, screening labs if needed. Health maintenance items as above in HPI discussed/recommended as applicable.   - no concerning responses on depression, fall, or functional status screening. Any positive responses noted as above. Advanced directives discussed as in CHL.   Lower abdominal pain - Plan: Comprehensive metabolic panel, POCT urinalysis dipstick, POCT Microscopic Urinalysis ([facility]), POCT CBC  -Reassuring CBC, urinalysis. Check CMP, and RTC precautions of abdominal pain persists. Also recommended scheduling repeat colonoscopy as appears to be due.   Need for hepatitis C  screening test - Plan: Hepatitis C antibody  Screening for osteoporosis - Plan: DG Bone Density Estrogen deficiency - Plan: DG Bone Density  - History of estrogen deficiency after hysterectomy. Due for repeat bone density.  Hyperlipidemia, unspecified hyperlipidemia type - Plan: Lipid panel, atorvastatin (LIPITOR) 20 MG tablet, fenofibrate (TRICOR) 145 MG tablet  - Tolerating Lipitor and TriCor current doses without new myalgias. Check lipid panel, continue same dose of medication.  Hypokalemia  - Noted at most recent hospital visit. Repeat testing on CMP as above  Hyperglycemia - Plan: Hemoglobin A1c  - Noted on previous blood work. Check A1c.  Essential hypertension - Plan: olmesartan-hydrochlorothiazide (BENICAR HCT) 20-12.5 MG tablet  - Stable, continue Benicar HCT same dose  Mild intermittent asthma without complication - Plan: albuterol (PROAIR HFA) 108 (90 Base) MCG/ACT inhaler  - Stable. Continue albuterol if needed for breakthrough symptoms and Advair at current dose. When she needs a refill, can clarify her dose of Advair at that time as unknown during office visit.  Benign paroxysmal positional vertigo, unspecified laterality - Plan: meclizine (ANTIVERT) 25 MG tablet  -Rare symptoms, denies any new symptoms. Controlled previously with meclizine when necessary.  -Refill placed, but RTC/ER precautions given if new neurologic symptoms.    Meds ordered this encounter  Medications  . albuterol (PROAIR HFA) 108 (90 Base) MCG/ACT inhaler    Sig: Inhale 2 puffs into the lungs every 6 (six) hours as needed  for wheezing or shortness of breath.    Dispense:  1 Inhaler    Refill:  0  . atorvastatin (LIPITOR) 20 MG tablet    Sig: Take 1 tablet (20 mg total) by mouth daily.    Dispense:  90 tablet    Refill:  1  . fenofibrate (TRICOR) 145 MG tablet    Sig: Take 1 tablet (145 mg total) by mouth daily.    Dispense:  90 tablet    Refill:  1  . olmesartan-hydrochlorothiazide  (BENICAR HCT) 20-12.5 MG tablet    Sig: Take 1 tablet by mouth daily.    Dispense:  90 tablet    Refill:  1  . meclizine (ANTIVERT) 25 MG tablet    Sig: Take 1 tablet (25 mg total) by mouth 3 (three) times daily as needed for dizziness.    Dispense:  30 tablet    Refill:  0   Patient Instructions   Call [facility] gastroenterology to see if that is where you had your last colonoscopy in 2008 as you may be due for a repeat study. They may also need to see you for your lower abdominal discomfort if that is not improving in the next week to see if other studies are needed. Return to the clinic or go to the nearest emergency room if any of your symptoms worsen or new symptoms occur.  [address], [place], [place] [address]  Phone: [phone]  No change in asthma meds for now, but call when you need refill of Advair so we can update our records on that dose.   If you require meclizine more frequently or any new symptoms - return to discuss further.   Same blood pressure and cholesterol meds for now.    Keeping You Healthy  Get These Tests  Blood Pressure- Have your blood pressure checked by your healthcare provider at least once a year.  Normal blood pressure is 120/80.  Weight- Have your body mass index (BMI) calculated to screen for obesity.  BMI is a measure of body fat based on height and weight.  You can calculate your own BMI at [employer].it  Cholesterol- Have your cholesterol checked every year.  Diabetes- Have your blood sugar checked every year if you have high blood pressure, high cholesterol, a family history of diabetes or if you are overweight.  Pap Test - Have a pap test every 1 to 5 years if you have been sexually active.  If you are older than 65 and recent pap tests have been normal you may not need additional pap tests.  In addition, if you have had a hysterectomy  for benign disease additional pap tests are not necessary.  Mammogram-Yearly mammograms  are essential for early detection of breast cancer  Screening for Colon Cancer- Colonoscopy starting at age 72. Screening may begin sooner depending on your family history and other health conditions.  Follow up colonoscopy as directed by your Gastroenterologist.  Screening for Osteoporosis- Screening begins at age 27 with bone density scanning, sooner if you are at higher risk for developing Osteoporosis.  Get these medicines  Calcium with Vitamin D- Your body requires 1200-1500 mg of Calcium a day and [phone] IU of Vitamin D a day.  You can only absorb 500 mg of Calcium at a time therefore Calcium must be taken in 2 or 3 separate doses throughout the day.  Hormones- Hormone therapy has been associated with increased risk for certain cancers and heart disease.  Talk to your healthcare provider about if you need relief from menopausal symptoms.  Aspirin- Ask your healthcare provider about taking Aspirin to prevent Heart Disease and Stroke.  Get these Immuniztions  Flu shot- Every fall  Pneumonia shot- Once after the age of 88; if you are younger ask your healthcare provider if you need a pneumonia shot.  Tetanus- Every ten years.  Zostavax- Once after the age of 110 to prevent shingles.  Take these steps  Don't smoke- Your healthcare provider can help you quit. For tips on how to quit, ask your healthcare provider or go to www.smokefree.gov or call 1-800 QUIT-NOW.  Be physically active- Exercise 5 days a week for a minimum of 30 minutes.  If you are not already physically active, start slow and gradually work up to 30 minutes of moderate physical activity.  Try walking, dancing, bike riding, swimming, etc.  Eat a healthy diet- Eat a variety of healthy foods such as fruits, vegetables, whole grains, low fat milk, low fat cheeses, yogurt, lean meats, chicken, fish, eggs, dried beans, tofu, etc.  For more information go to www.thenutritionsource.org  Dental visit- Brush and floss teeth  twice daily; visit your dentist twice a year.  Eye exam- Visit your Optometrist or Ophthalmologist yearly.  Drink alcohol in moderation- Limit alcohol intake to one drink or less a day.  Never drink and drive.  Depression- Your emotional health is as important as your physical health.  If you're feeling down or losing interest in things you normally enjoy, please talk to your healthcare provider.  Seat Belts- can save your life; always wear one  Smoke/Carbon Monoxide detectors- These detectors need to be installed on the appropriate level of your home.  Replace batteries at least once a year.  Violence- If anyone is threatening or hurting you, please tell your healthcare provider.  Living Will/ Health care power of attorney- Discuss with your healthcare provider and family.   Abdominal Pain, Adult Abdominal pain can be caused by many things. Often, abdominal pain is not serious and it gets better with no treatment or by being treated at home. However, sometimes abdominal pain is serious. Your health care provider will do a medical history and a physical exam to try to determine the cause of your abdominal pain. Follow these instructions at home:  Take over-the-counter and prescription medicines only as told by your health care provider. Do not take a laxative unless told by your health care provider.  Drink enough fluid to keep your urine clear or pale yellow.  Watch your condition for any changes.  Keep all follow-up visits as told by your health care provider. This is important. Contact a health care provider if:  Your abdominal pain changes or gets worse.  You are not hungry or you lose weight without trying.  You are constipated or have diarrhea for more than 2-3 days.  You have pain when you urinate or have a bowel movement.  Your abdominal pain wakes you up at night.  Your pain gets worse with meals, after eating, or with certain foods.  You are throwing up and cannot  keep anything down.  You have a fever. Get help right away if:  Your pain does not go away as soon as your health care provider told you to expect.  You cannot stop throwing up.  Your pain is only in areas of the abdomen, such as the right side or the left lower portion of the abdomen.  You have  bloody or black stools, or stools that look like tar.  You have severe pain, cramping, or bloating in your abdomen.  You have signs of dehydration, such as:  Dark urine, very little urine, or no urine.  Cracked lips.  Dry mouth.  Sunken eyes.  Sleepiness.  Weakness. This information is not intended to replace advice given to you by your health care provider. Make sure you discuss any questions you have with your health care provider. Document Released: 07/11/2005 Document Revised: 04/20/2016 Document Reviewed: 03/14/2016 Elsevier Interactive Patient Education  2017 [employer].    IF you received an x-ray today, you will receive an invoice from [facility]. Please contact [facility] at [phone] with questions or concerns regarding your invoice.   IF you received labwork today, you will receive an invoice from [facility]. Please contact [facility] at [phone] with questions or concerns regarding your invoice.   Our billing staff will not be able to assist you with questions regarding bills from these companies.  You will be contacted with the lab results as soon as they are available. The fastest way to get your results is to activate your My Chart account. Instructions are located on the last page of this paperwork. If you have not heard from [place] regarding the results in 2 weeks, please contact this office.       I personally performed the services described in this documentation, which was scribed in my presence. The recorded information has been reviewed and considered for accuracy and completeness, addended by me as needed, and agree with information  above.  Signed,   Merri Ray, MD Primary [facility] at [place].  12/31/16 7:42 PM

## 2016-12-31 NOTE — Patient Instructions (Addendum)
Call [facility] gastroenterology to see if that is where you had your last colonoscopy in 2008 as you may be due for a repeat study. They may also need to see you for your lower abdominal discomfort if that is not improving in the next week to see if other studies are needed. Return to the clinic or go to the nearest emergency room if any of your symptoms worsen or new symptoms occur.  [address], [place], [place] [address]  Phone: [phone]  No change in asthma meds for now, but call when you need refill of Advair so we can update our records on that dose.   If you require meclizine more frequently or any new symptoms - return to discuss further.   Same blood pressure and cholesterol meds for now.    Keeping You Healthy  Get These Tests  Blood Pressure- Have your blood pressure checked by your healthcare provider at least once a year.  Normal blood pressure is 120/80.  Weight- Have your body mass index (BMI) calculated to screen for obesity.  BMI is a measure of body fat based on height and weight.  You can calculate your own BMI at [employer].it  Cholesterol- Have your cholesterol checked every year.  Diabetes- Have your blood sugar checked every year if you have high blood pressure, high cholesterol, a family history of diabetes or if you are overweight.  Pap Test - Have a pap test every 1 to 5 years if you have been sexually active.  If you are older than 65 and recent pap tests have been normal you may not need additional pap tests.  In addition, if you have had a hysterectomy  for benign disease additional pap tests are not necessary.  Mammogram-Yearly mammograms are essential for early detection of breast cancer  Screening for Colon Cancer- Colonoscopy starting at age 76. Screening may begin sooner depending on your family history and other health conditions.  Follow up colonoscopy as directed by your Gastroenterologist.  Screening for Osteoporosis- Screening  begins at age 60 with bone density scanning, sooner if you are at higher risk for developing Osteoporosis.  Get these medicines  Calcium with Vitamin D- Your body requires 1200-1500 mg of Calcium a day and [phone] IU of Vitamin D a day.  You can only absorb 500 mg of Calcium at a time therefore Calcium must be taken in 2 or 3 separate doses throughout the day.  Hormones- Hormone therapy has been associated with increased risk for certain cancers and heart disease.  Talk to your healthcare provider about if you need relief from menopausal symptoms.  Aspirin- Ask your healthcare provider about taking Aspirin to prevent Heart Disease and Stroke.  Get these Immuniztions  Flu shot- Every fall  Pneumonia shot- Once after the age of 18; if you are younger ask your healthcare provider if you need a pneumonia shot.  Tetanus- Every ten years.  Zostavax- Once after the age of 24 to prevent shingles.  Take these steps  Don't smoke- Your healthcare provider can help you quit. For tips on how to quit, ask your healthcare provider or go to www.smokefree.gov or call 1-800 QUIT-NOW.  Be physically active- Exercise 5 days a week for a minimum of 30 minutes.  If you are not already physically active, start slow and gradually work up to 30 minutes of moderate physical activity.  Try walking, dancing, bike riding, swimming, etc.  Eat a healthy diet- Eat a variety of healthy foods  such as fruits, vegetables, whole grains, low fat milk, low fat cheeses, yogurt, lean meats, chicken, fish, eggs, dried beans, tofu, etc.  For more information go to www.thenutritionsource.org  Dental visit- Brush and floss teeth twice daily; visit your dentist twice a year.  Eye exam- Visit your Optometrist or Ophthalmologist yearly.  Drink alcohol in moderation- Limit alcohol intake to one drink or less a day.  Never drink and drive.  Depression- Your emotional health is as important as your physical health.  If you're  feeling down or losing interest in things you normally enjoy, please talk to your healthcare provider.  Seat Belts- can save your life; always wear one  Smoke/Carbon Monoxide detectors- These detectors need to be installed on the appropriate level of your home.  Replace batteries at least once a year.  Violence- If anyone is threatening or hurting you, please tell your healthcare provider.  Living Will/ Health care power of attorney- Discuss with your healthcare provider and family.   Abdominal Pain, Adult Abdominal pain can be caused by many things. Often, abdominal pain is not serious and it gets better with no treatment or by being treated at home. However, sometimes abdominal pain is serious. Your health care provider will do a medical history and a physical exam to try to determine the cause of your abdominal pain. Follow these instructions at home:  Take over-the-counter and prescription medicines only as told by your health care provider. Do not take a laxative unless told by your health care provider.  Drink enough fluid to keep your urine clear or pale yellow.  Watch your condition for any changes.  Keep all follow-up visits as told by your health care provider. This is important. Contact a health care provider if:  Your abdominal pain changes or gets worse.  You are not hungry or you lose weight without trying.  You are constipated or have diarrhea for more than 2-3 days.  You have pain when you urinate or have a bowel movement.  Your abdominal pain wakes you up at night.  Your pain gets worse with meals, after eating, or with certain foods.  You are throwing up and cannot keep anything down.  You have a fever. Get help right away if:  Your pain does not go away as soon as your health care provider told you to expect.  You cannot stop throwing up.  Your pain is only in areas of the abdomen, such as the right side or the left lower portion of the abdomen.  You  have bloody or black stools, or stools that look like tar.  You have severe pain, cramping, or bloating in your abdomen.  You have signs of dehydration, such as:  Dark urine, very little urine, or no urine.  Cracked lips.  Dry mouth.  Sunken eyes.  Sleepiness.  Weakness. This information is not intended to replace advice given to you by your health care provider. Make sure you discuss any questions you have with your health care provider. Document Released: 07/11/2005 Document Revised: 04/20/2016 Document Reviewed: 03/14/2016 Elsevier Interactive Patient Education  2017 [employer].    IF you received an x-ray today, you will receive an invoice from [facility]. Please contact [facility] at [phone] with questions or concerns regarding your invoice.   IF you received labwork today, you will receive an invoice from [place]. Please contact [facility] at [phone] with questions or concerns regarding your invoice.   Our billing staff will not be able to  assist you with questions regarding bills from these companies.  You will be contacted with the lab results as soon as they are available. The fastest way to get your results is to activate your My Chart account. Instructions are located on the last page of this paperwork. If you have not heard from [place] regarding the results in 2 weeks, please contact this office.

## 2017-01-01 LAB — HEMOGLOBIN A1C
Est. average glucose Bld gHb Est-mCnc: 114 mg/dL
Hgb A1c MFr Bld: 5.6 % (ref 4.8–5.6)

## 2017-01-01 LAB — COMPREHENSIVE METABOLIC PANEL
ALT: 20 IU/L (ref 0–32)
AST: 25 IU/L (ref 0–40)
Albumin/Globulin Ratio: 2.1 (ref 1.2–2.2)
Albumin: 4.7 g/dL (ref 3.5–4.8)
Alkaline Phosphatase: 59 IU/L (ref 39–117)
BUN/Creatinine Ratio: 37 — ABNORMAL HIGH (ref 12–28)
BUN: 26 mg/dL (ref 8–27)
Bilirubin Total: 0.4 mg/dL (ref 0.0–1.2)
CO2: 20 mmol/L (ref 18–29)
Calcium: 9.9 mg/dL (ref 8.7–10.3)
Chloride: 104 mmol/L (ref 96–106)
Creatinine, Ser: 0.7 mg/dL (ref 0.57–1.00)
GFR calc Af Amer: 101 mL/min/{1.73_m2} (ref >59)
GFR calc non Af Amer: 87 mL/min/{1.73_m2} (ref >59)
Globulin, Total: 2.2 g/dL (ref 1.5–4.5)
Glucose: 107 mg/dL — ABNORMAL HIGH (ref 65–99)
Potassium: 4.5 mmol/L (ref 3.5–5.2)
Sodium: 142 mmol/L (ref 134–144)
Total Protein: 6.9 g/dL (ref 6.0–8.5)

## 2017-01-01 LAB — LIPID PANEL
Chol/HDL Ratio: 2.7 ratio units (ref 0.0–4.4)
Cholesterol, Total: 188 mg/dL (ref 100–199)
HDL: 69 mg/dL (ref >39)
LDL Calculated: 94 mg/dL (ref 0–99)
Triglycerides: 127 mg/dL (ref 0–149)
VLDL Cholesterol Cal: 25 mg/dL (ref 5–40)

## 2017-01-01 LAB — HEPATITIS C ANTIBODY: Hep C Virus Ab: <0.1 s/co ratio (ref 0.0–0.9)

## 2017-04-03 DIAGNOSIS — M25571 Pain in right ankle and joints of right foot: Secondary | ICD-10-CM | POA: Diagnosis not present

## 2017-04-09 ENCOUNTER — Ambulatory Visit (INDEPENDENT_AMBULATORY_CARE_PROVIDER_SITE_OTHER): Payer: Medicare Other | Admitting: Family Medicine

## 2017-04-09 ENCOUNTER — Encounter: Payer: Self-pay | Admitting: Family Medicine

## 2017-04-09 ENCOUNTER — Ambulatory Visit (INDEPENDENT_AMBULATORY_CARE_PROVIDER_SITE_OTHER): Payer: Medicare Other

## 2017-04-09 VITALS — BP 154/82 | HR 79 | Temp 98.2°F | Resp 16 | Ht 65.0 in | Wt 184.0 lb

## 2017-04-09 DIAGNOSIS — R0602 Shortness of breath: Secondary | ICD-10-CM | POA: Diagnosis not present

## 2017-04-09 DIAGNOSIS — J45991 Cough variant asthma: Secondary | ICD-10-CM | POA: Diagnosis not present

## 2017-04-09 DIAGNOSIS — R062 Wheezing: Secondary | ICD-10-CM

## 2017-04-09 MED ORDER — ALBUTEROL SULFATE (2.5 MG/3ML) 0.083% IN NEBU
2.5000 mg | INHALATION_SOLUTION | Freq: Once | RESPIRATORY_TRACT | Status: AC
  Administered 2017-04-09: 2.5 mg via RESPIRATORY_TRACT

## 2017-04-09 MED ORDER — FLUTICASONE-SALMETEROL 115-21 MCG/ACT IN AERO: 2.0000 | INHALATION_SPRAY | Freq: Two times a day (BID) | RESPIRATORY_TRACT | 5 refills | Status: DC

## 2017-04-09 MED ORDER — IPRATROPIUM BROMIDE 0.02 % IN SOLN
0.5000 mg | Freq: Once | RESPIRATORY_TRACT | Status: AC
  Administered 2017-04-09: 0.5 mg via RESPIRATORY_TRACT

## 2017-04-09 NOTE — Patient Instructions (Addendum)
Thank you for coming in today, it was so nice to see you! Today we talked about:    Asthma exacerbation: We have given you a duoneb breathing treatment to help open your lungs. Please use your albuterol inhaler 2 puffs every 4 hours for the next 24 hours, then as needed. We have refilled your Advair medication as well. Your chest xray looked normal.   Reasons to go to the hospital would be if you have wheezing or shortness of breath that doesn't get better with your albuterol inhaler or if you have fever.   Please follow up in 3 days if you are not feeling better.   Take care!   Sincerely,  Smitty Cords, MD    IF you received an x-ray today, you will receive an invoice from [facility]. Please contact [facility] at [phone] with questions or concerns regarding your invoice.   IF you received labwork today, you will receive an invoice from [place]. Please contact [facility] at [phone] with questions or concerns regarding your invoice.   Our billing staff will not be able to assist you with questions regarding bills from these companies.  You will be contacted with the lab results as soon as they are available. The fastest way to get your results is to activate your My Chart account. Instructions are located on the last page of this paperwork. If you have not heard from [place] regarding the results in 2 weeks, please contact this office.     Asthma, Adult Asthma is a recurring condition in which the airways tighten and narrow. Asthma can make it difficult to breathe. It can cause coughing, wheezing, and shortness of breath. Asthma episodes, also called asthma attacks, range from minor to life-threatening. Asthma cannot be cured, but medicines and lifestyle changes can help control it. What are the causes? Asthma is believed to be caused by inherited (genetic) and environmental factors, but its exact cause is unknown. Asthma may be triggered by allergens, lung  infections, or irritants in the air. Asthma triggers are different for each person. Common triggers include:  Animal dander.  Dust mites.  Cockroaches.  Pollen from trees or grass.  Mold.  Smoke.  Air pollutants such as dust, household cleaners, hair sprays, aerosol sprays, paint fumes, strong chemicals, or strong odors.  Cold air, weather changes, and winds (which increase molds and pollens in the air).  Strong emotional expressions such as crying or laughing hard.  Stress.  Certain medicines (such as aspirin) or types of drugs (such as beta-blockers).  Sulfites in foods and drinks. Foods and drinks that may contain sulfites include dried fruit, potato chips, and sparkling grape juice.  Infections or inflammatory conditions such as the flu, a cold, or an inflammation of the nasal membranes (rhinitis).  Gastroesophageal reflux disease (GERD).  Exercise or strenuous activity.  What are the signs or symptoms? Symptoms may occur immediately after asthma is triggered or many hours later. Symptoms include:  Wheezing.  Excessive nighttime or early morning coughing.  Frequent or severe coughing with a common cold.  Chest tightness.  Shortness of breath.  How is this diagnosed? The diagnosis of asthma is made by a review of your medical history and a physical exam. Tests may also be performed. These may include:  Lung function studies. These tests show how much air you breathe in and out.  Allergy tests.  Imaging tests such as X-rays.  How is this treated? Asthma cannot be cured, but it can usually be controlled.  Treatment involves identifying and avoiding your asthma triggers. It also involves medicines. There are 2 classes of medicine used for asthma treatment:  Controller medicines. These prevent asthma symptoms from occurring. They are usually taken every day.  Reliever or rescue medicines. These quickly relieve asthma symptoms. They are used as needed and  provide short-term relief.  Your health care provider will help you create an asthma action plan. An asthma action plan is a written plan for managing and treating your asthma attacks. It includes a list of your asthma triggers and how they may be avoided. It also includes information on when medicines should be taken and when their dosage should be changed. An action plan may also involve the use of a device called a peak flow meter. A peak flow meter measures how well the lungs are working. It helps you monitor your condition. Follow these instructions at home:  Take medicines only as directed by your health care provider. Speak with your health care provider if you have questions about how or when to take the medicines.  Use a peak flow meter as directed by your health care provider. Record and keep track of readings.  Understand and use the action plan to help minimize or stop an asthma attack without needing to seek medical care.  Control your home environment in the following ways to help prevent asthma attacks: ? Do not smoke. Avoid being exposed to secondhand smoke. ? Change your heating and air conditioning filter regularly. ? Limit your use of fireplaces and wood stoves. ? Get rid of pests (such as roaches and mice) and their droppings. ? Throw away plants if you see mold on them. ? Clean your floors and dust regularly. Use unscented cleaning products. ? Try to have someone else vacuum for you regularly. Stay out of rooms while they are being vacuumed and for a short while afterward. If you vacuum, use a dust mask from a hardware store, a double-layered or microfilter vacuum cleaner bag, or a vacuum cleaner with a HEPA filter. ? Replace carpet with wood, tile, or vinyl flooring. Carpet can trap dander and dust. ? Use allergy-proof pillows, mattress covers, and box spring covers. ? Wash bed sheets and blankets every week in hot water and dry them in a dryer. ? Use blankets that are  made of polyester or cotton. ? Clean bathrooms and kitchens with bleach. If possible, have someone repaint the walls in these rooms with mold-resistant paint. Keep out of the rooms that are being cleaned and painted. ? Wash hands frequently. Contact a health care provider if:  You have wheezing, shortness of breath, or a cough even if taking medicine to prevent attacks.  The colored mucus you cough up (sputum) is thicker than usual.  Your sputum changes from clear or white to yellow, green, gray, or bloody.  You have any problems that may be related to the medicines you are taking (such as a rash, itching, swelling, or trouble breathing).  You are using a reliever medicine more than 2-3 times per week.  Your peak flow is still at 50-79% of your personal best after following your action plan for 1 hour.  You have a fever. Get help right away if:  You seem to be getting worse and are unresponsive to treatment during an asthma attack.  You are short of breath even at rest.  You get short of breath when doing very little physical activity.  You have difficulty eating, drinking, or talking due  to asthma symptoms.  You develop chest pain.  You develop a fast heartbeat.  You have a bluish color to your lips or fingernails.  You are light-headed, dizzy, or faint.  Your peak flow is less than 50% of your personal best. This information is not intended to replace advice given to you by your health care provider. Make sure you discuss any questions you have with your health care provider. Document Released: 10/01/2005 Document Revised: 03/14/2016 Document Reviewed: 04/30/2013 Elsevier Interactive Patient Education  2017 [employer].

## 2017-04-09 NOTE — Progress Notes (Signed)
[name] is a 71 y.o. female with PMH of asthma who presents to Primary Care at [facility] today for  Chief Complaint  Patient presents with  . Cough    with congestion and "yellowish" mucus  . Wheezing    with chest tightness   1. Cough and Wheezing  Onset: Started 2 weeks. Associated with runny nose. Also had some ear itching. Her 83 year old grandson was taken to the hospital on Saturday with cough and nasal congestion  Course: has been stable  Worse with: Nothing  Better with: Nothing identified. Has been trying mucinex and night time cold without relief Uses Albuterol inhaler about 3 times a day for the last 3 days. Was previously on Advair but ran out a couple days ago   Symptoms Sputum: yes, yellow  Fever: no  Shortness of breath:yes  Leg Swelling:no  Heart Burn or Reflux:no  Wheezing: yes, associated with some chest tightness  Endorses some orthopnea and needing to sleep with more pillows than usual (right now using 4 pillows) Post Nasal Drip: yes with the runny nose  Red Flags Weight Loss:  no Immunocompromised:  no  PMH Asthma or COPD: yes asthma   PMH of Smoking: no  Using ACEIs: no   ROS as above.  Pertinently, no palpitations, Fever, Chills, Abd pain, N/V/D.   PMH reviewed. Patient is a nonsmoker.   Past Medical History:  Diagnosis Date  . Arthritis   . GERD (gastroesophageal reflux disease)    at times  . Hyperlipidemia   . Hypertension   . Peripheral vascular disease ([place])    has spider veins  . Pneumothorax, traumatic    RIGHT; associated with multiple rib fractures after a fall   Past Surgical History:  Procedure Laterality Date  . ABDOMINAL HYSTERECTOMY  1982  . 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 BREAST SURGERY    . JOINT REPLACEMENT  2009   right knee  . JOINT REPLACEMENT  2011   left knee  . MASTECTOMY  2008   right breast  . SHOULDER ARTHROSCOPY DISTAL CLAVICLE EXCISION AND OPEN ROTATOR CUFF REPAIR  2011  . TOTAL SHOULDER  ARTHROPLASTY  03/14/2012   Procedure: TOTAL SHOULDER ARTHROPLASTY;  Surgeon: Alta Corning, MD;  Location: [place];  Service: Orthopedics;  Laterality: Left;    Medications reviewed. Current Outpatient Prescriptions  Medication Sig Dispense Refill  . acetaminophen (TYLENOL) 325 MG tablet Take 650 mg by mouth every 6 (six) hours as needed.    Marland Kitchen albuterol (PROAIR HFA) 108 (90 Base) MCG/ACT inhaler Inhale 2 puffs into the lungs every 6 (six) hours as needed for wheezing or shortness of breath. 1 Inhaler 0  . atorvastatin (LIPITOR) 20 MG tablet Take 1 tablet (20 mg total) by mouth daily. 90 tablet 1  . Dextromethorphan-Guaifenesin 20-400 MG TABS Take by mouth daily.    . diphenhydrAMINE (SOMINEX) 25 MG tablet Take 50 mg by mouth at bedtime as needed for sleep.    Marland Kitchen DM-Doxylamine-Acetaminophen (VICKS NYQUIL MULTI-SYMPTOM PO) Take 1 tablet by mouth daily.    . fenofibrate (TRICOR) 145 MG tablet Take 1 tablet (145 mg total) by mouth daily. 90 tablet 1  . GUAIFENESIN PO Take 400 mg by mouth 2 (two) times daily.    Marland Kitchen ibuprofen (ADVIL,MOTRIN) 100 MG tablet Take 100 mg by mouth every 6 (six) hours as needed.    . meclizine (ANTIVERT) 25 MG tablet Take 1 tablet (25 mg total) by  mouth 3 (three) times daily as needed for dizziness. 30 tablet 0  . Multiple Vitamin (MULTIVITAMIN) capsule Take 1 capsule by mouth daily.    Marland Kitchen olmesartan-hydrochlorothiazide (BENICAR HCT) 20-12.5 MG tablet Take 1 tablet by mouth daily. 90 tablet 1  . oxymetazoline (AFRIN) 0.05 % nasal spray Place 1 spray into both nostrils Nightly.    . fluticasone-salmeterol (ADVAIR HFA) 115-21 MCG/ACT inhaler Inhale 2 puffs into the lungs 2 (two) times daily. 1 Inhaler 5   No current facility-administered medications for this visit.    Chest XRAY:   Physical Exam:  BP (!) 154/82 (BP Location: Left Arm, Patient Position: Sitting, Cuff Size: Normal)   Pulse 79   Temp 98.2 F (36.8 C) (Oral)   Resp 16   Ht 5\' 5"  (1.651 m)   Wt 184 lb (83.5  kg)   SpO2 95%   BMI 30.62 kg/m  Gen:  Alert, cooperative patient who appears stated age in no acute distress.  Vital signs reviewed.  HEENT: EOMI,  MMM Pulm: Normal work of breathing. Inspiratory and expiratory wheezing throughout all lung fields bilaterally  Cardiac:  Regular rate and rhythm without murmur auscultated.  Good S1/S2. Exts: Non edematous BL  LE, warm and well perfused.   CXR: No active cardiopulmonary disease  Assessment and Plan:  1.  Asthma exacerbation: History and exam align with asthma exacerbation. It is unusual that she was diagnosed only 2 years ago and has no hx of smoking. Duoneb x 1 given in clinic with improvement of wheezing. CXR obtained and showed no infiltrates, vascular congestion, or signs of pulmonary edema.   - Albuterol q 4 hours x 24 hours and then PRN  - Refilled Advair - Return precautions discussed - Follow up as needed  Smitty Cords, MD [place], PGY-2

## 2017-04-14 NOTE — Progress Notes (Signed)
Patient discussed and examined with Dr. Juanito Doom. Agree with findings, assessment and plan of care per her note.

## 2017-07-19 ENCOUNTER — Other Ambulatory Visit: Payer: Self-pay | Admitting: Family Medicine

## 2017-07-19 DIAGNOSIS — E785 Hyperlipidemia, unspecified: Secondary | ICD-10-CM

## 2017-07-19 DIAGNOSIS — I10 Essential (primary) hypertension: Secondary | ICD-10-CM

## 2017-07-19 NOTE — Telephone Encounter (Signed)
COULDN'T LEAVE MESSAGE ON MACHINE BECAUSE IT WAS FULL IF PT DO CALL PLEASE  SCHEDULE AN OV WITHIN THE END OF THE MONTH FOR MORE MED REFILL WITH [facility]

## 2017-08-13 ENCOUNTER — Ambulatory Visit: Payer: Medicare Other | Admitting: Family Medicine

## 2017-08-16 ENCOUNTER — Other Ambulatory Visit: Payer: Self-pay | Admitting: Family Medicine

## 2017-08-16 DIAGNOSIS — Z1239 Encounter for other screening for malignant neoplasm of breast: Secondary | ICD-10-CM

## 2017-08-17 DIAGNOSIS — Z23 Encounter for immunization: Secondary | ICD-10-CM | POA: Diagnosis not present

## 2017-08-19 ENCOUNTER — Ambulatory Visit (INDEPENDENT_AMBULATORY_CARE_PROVIDER_SITE_OTHER): Payer: Medicare Other | Admitting: Family Medicine

## 2017-08-19 ENCOUNTER — Encounter: Payer: Self-pay | Admitting: Family Medicine

## 2017-08-19 VITALS — BP 134/84 | HR 86 | Temp 98.4°F | Resp 18 | Ht 65.0 in | Wt 186.0 lb

## 2017-08-19 DIAGNOSIS — E785 Hyperlipidemia, unspecified: Secondary | ICD-10-CM

## 2017-08-19 DIAGNOSIS — R062 Wheezing: Secondary | ICD-10-CM

## 2017-08-19 DIAGNOSIS — J45991 Cough variant asthma: Secondary | ICD-10-CM | POA: Diagnosis not present

## 2017-08-19 DIAGNOSIS — I10 Essential (primary) hypertension: Secondary | ICD-10-CM

## 2017-08-19 MED ORDER — ATORVASTATIN CALCIUM 20 MG PO TABS: 20.0000 mg | ORAL_TABLET | Freq: Every day | ORAL | 2 refills | Status: DC

## 2017-08-19 MED ORDER — FENOFIBRATE 145 MG PO TABS: 145.0000 mg | ORAL_TABLET | Freq: Every day | ORAL | 2 refills | Status: DC

## 2017-08-19 MED ORDER — FLUTICASONE-SALMETEROL 115-21 MCG/ACT IN AERO: 2.0000 | INHALATION_SPRAY | Freq: Two times a day (BID) | RESPIRATORY_TRACT | 5 refills | Status: DC

## 2017-08-19 MED ORDER — OLMESARTAN MEDOXOMIL-HCTZ 20-12.5 MG PO TABS: 1.0000 | ORAL_TABLET | Freq: Every day | ORAL | 2 refills | Status: DC

## 2017-08-19 NOTE — Patient Instructions (Addendum)
  No change in meds for now - I will let you know about your lab results.  I do hear a slight wheeze today. Ok to continue Advair for now, but if any increased cough, or wheeze - would recommend stronger dose. Ok to use albuterol (rescue inhaler) if needed, but if that is needed more than 2 days per week - let me know.   Please call to schedule colonoscopy as you are due.   Follow up in 5 months for wellness exam.   IF you received an x-ray today, you will receive an invoice from [facility]. Please contact [facility] at [phone] with questions or concerns regarding your invoice.   IF you received labwork today, you will receive an invoice from [place]. Please contact [facility] at [phone] with questions or concerns regarding your invoice.   Our billing staff will not be able to assist you with questions regarding bills from these companies.  You will be contacted with the lab results as soon as they are available. The fastest way to get your results is to activate your My Chart account. Instructions are located on the last page of this paperwork. If you have not heard from [place] regarding the results in 2 weeks, please contact this office.

## 2017-08-19 NOTE — Progress Notes (Signed)
Subjective:  By signing my name below, I, [name], attest that this documentation has been prepared under the direction and in the presence of Adrienne Agreste, MD Electronically Signed: Ladene Zimmerman, ED Scribe 08/19/2017 at 12:19 PM.   Patient ID: [name], female    DOB: 1945-10-30, 71 y.o.   MRN: [id number]  Chief Complaint  Patient presents with  . Medication Refill    patient requesting refills on Benicar, Tricor and Lipitor   HPI [name] is a 71 y.o. female who presents to Primary Care at [facility] for medication refill. Pt is fasting at this visit.  HTN Takes Benicar HCT 20-12.5 mg qd. Pt does not check her BP outside of the office. Denies HAs, light-headedness, dizziness, cp, cough, any other side-effects.  Lab Results  Component Value Date   CREATININE 0.70 12/31/2016   Hyperlipidemia Lab Results  Component Value Date   CHOL 188 12/31/2016   HDL 69 12/31/2016   LDLCALC 94 12/31/2016   TRIG 127 12/31/2016   CHOLHDL 2.7 12/31/2016   Lab Results  Component Value Date   ALT 20 12/31/2016   AST 25 12/31/2016   ALKPHOS 59 12/31/2016   BILITOT 0.4 12/31/2016  She has tolerated Lipitor and Tricor without myalgias. Denies abdominal pain, blood in stools, melena.   Asthma Treated in June with flare. Continued on Advair 115-21 mcg inhaler, Albuterol PRN. She has been using Advair daily and Albuterol every once in a while when going outdoors to walk her [place]. Denies wheezing, sob.  Health Maintenance  Pt is due for a colonoscopy.  Patient Active Problem List   Diagnosis Date Noted  . Cough variant asthma 04/04/2016  . Upper airway cough syndrome 04/04/2016  . 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 Arthritis   . GERD (gastroesophageal reflux disease)    at times  . Hyperlipidemia   . Hypertension   . Peripheral vascular disease ([place])    has spider veins  . Pneumothorax, traumatic    RIGHT; associated with multiple rib fractures after a fall   Past Surgical History:  Procedure Laterality Date  . ABDOMINAL HYSTERECTOMY  1982  . 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 BREAST SURGERY    . JOINT REPLACEMENT  2009   right knee  . JOINT REPLACEMENT  2011   left knee  . MASTECTOMY  2008   right breast  . SHOULDER ARTHROSCOPY DISTAL CLAVICLE EXCISION AND OPEN ROTATOR CUFF REPAIR  2011   Allergies  Allergen Reactions  . Dilaudid [Hydromorphone Hcl] Itching  . Morphine And Related Nausea And Vomiting   Prior to Admission medications   Medication Sig Start Date End Date Taking? Authorizing Provider  acetaminophen (TYLENOL) 325 MG tablet Take 650 mg by mouth every 6 (six) hours as needed.    [provider]  albuterol (PROAIR HFA) 108 (90 Base) MCG/ACT inhaler Inhale 2 puffs into the lungs every 6 (six) hours as needed for wheezing or shortness of breath. 12/31/16   Adrienne Agreste, MD  atorvastatin (LIPITOR) 20 MG tablet take 1 tablet by mouth once daily 07/19/17   Adrienne Agreste, MD  Dextromethorphan-Guaifenesin 20-400 MG TABS Take by mouth daily.    [provider]  diphenhydrAMINE (SOMINEX) 25 MG tablet Take 50 mg by mouth at bedtime as needed for sleep.    [provider]  DM-Doxylamine-Acetaminophen (VICKS NYQUIL MULTI-SYMPTOM PO) Take 1 tablet  by mouth daily.    [provider]  fenofibrate (TRICOR) 145 MG tablet take 1 tablet by mouth once daily 07/19/17   Adrienne Agreste, MD  fluticasone-salmeterol (ADVAIR [facility]) [phone] MCG/ACT inhaler Inhale 2 puffs into the lungs 2 (two) times daily. 04/09/17   Adrienne Agreste, MD  GUAIFENESIN PO Take 400 mg by mouth 2 (two) times daily.    [provider]  ibuprofen (ADVIL,MOTRIN) 100 MG tablet Take 100 mg by mouth every 6 (six) hours as needed.    [provider]  meclizine (ANTIVERT) 25 MG tablet Take 1 tablet (25 mg total) by mouth 3 (three) times daily as needed for  dizziness. 12/31/16   Adrienne Agreste, MD  Multiple Vitamin (MULTIVITAMIN) capsule Take 1 capsule by mouth daily.    [provider]  olmesartan-hydrochlorothiazide (BENICAR HCT) 20-12.5 MG tablet take 1 tablet by mouth once daily 07/19/17   Adrienne Agreste, MD  oxymetazoline (AFRIN) 0.05 % nasal spray Place 1 spray into both nostrils Nightly.    [provider]   Social History   Socioeconomic History  . Marital status: Divorced    Spouse name: Not on file  . Number of children: 2  . Years of education: Not on file  . Highest education level: Not on file  Social Needs  . Financial resource strain: Not on file  . Food insecurity - worry: Not on file  . Food insecurity - inability: Not on file  . Transportation needs - medical: Not on file  . Transportation needs - non-medical: Not on file  Occupational History  . Occupation: Retired  . Occupation: Glass blower/designer    Comment: Arts development officer  Tobacco Use  . Smoking status: Never Smoker  . Smokeless tobacco: Never Used  Substance and Sexual Activity  . Alcohol use: No    Alcohol/week: 0.0 oz  . Drug use: No  . Sexual activity: Not on file  Other Topics Concern  . Not on file  Social History Narrative  . Not on file   Review of Systems  Constitutional: Negative for fatigue and unexpected weight change.  Respiratory: Negative for cough, chest tightness, shortness of breath and wheezing.   Cardiovascular: Negative for chest pain, palpitations and leg swelling.  Gastrointestinal: Negative for abdominal pain and blood in stool.  Musculoskeletal: Negative for myalgias.  Neurological: Negative for dizziness, syncope, light-headedness and headaches.      Objective:   Physical Exam  Constitutional: She is oriented to person, place, and time. She appears well-developed and well-nourished.  HENT:  Head: Normocephalic and atraumatic.  Eyes: Conjunctivae and EOM are normal. Pupils are equal, round, and  reactive to light.  Neck: Carotid bruit is not present.  Cardiovascular: Normal rate, regular rhythm, normal heart sounds and intact distal pulses.  Pulmonary/Chest: Effort normal. No respiratory distress. She has wheezes.  Slight faint end expiratory wheeze.  Abdominal: Soft. She exhibits no pulsatile midline mass. There is no tenderness.  Neurological: She is alert and oriented to person, place, and time.  Skin: Skin is warm and dry.  Psychiatric: She has a normal mood and affect. Her behavior is normal.  Vitals reviewed.  Vitals:   08/19/17 1158  BP: 134/84  Pulse: 86  Resp: 18  Temp: 98.4 F (36.9 C)  TempSrc: Oral  SpO2: 97%  Weight: 186 lb (84.4 kg)  Height: 5\' 5"  (1.651 m)      Assessment & Plan:   RISSIE SCULLEY is a 71  y.o. female Essential hypertension - Plan: Comprehensive metabolic panel, olmesartan-hydrochlorothiazide (BENICAR HCT) 20-12.5 MG tablet  - stable. Ideal goal less than 130/80. No changes for now. Refilled Benicar HCT same dose, labs pending  Cough variant asthma  -Some faint wheeze on exam, but asymptomatic. Continue Advair, cautioned if increase wheeze or cough - would increase dose or change medication. Albuterol if needed for breakthrough symptoms with RTC precautions if frequent use.  Hyperlipidemia, unspecified hyperlipidemia type - Plan: Comprehensive metabolic panel, Lipid panel, fenofibrate (TRICOR) 145 MG tablet, atorvastatin (LIPITOR) 20 MG tablet  - tolerating current regimen without myalgias. Depending on levels, could consider changing to just statin as a trial. Labs pending.    Meds ordered this encounter  Medications  . olmesartan-hydrochlorothiazide (BENICAR HCT) 20-12.5 MG tablet    Sig: Take 1 tablet daily by mouth.    Dispense:  90 tablet    Refill:  2  . fluticasone-salmeterol (ADVAIR HFA) 115-21 MCG/ACT inhaler    Sig: Inhale 2 puffs 2 (two) times daily into the lungs.    Dispense:  1 Inhaler    Refill:  5  . fenofibrate  (TRICOR) 145 MG tablet    Sig: Take 1 tablet (145 mg total) daily by mouth.    Dispense:  90 tablet    Refill:  2  . atorvastatin (LIPITOR) 20 MG tablet    Sig: Take 1 tablet (20 mg total) daily by mouth.    Dispense:  90 tablet    Refill:  2   Patient Instructions    No change in meds for now - I will let you know about your lab results.  I do hear a slight wheeze today. Ok to continue Advair for now, but if any increased cough, or wheeze - would recommend stronger dose. Ok to use albuterol (rescue inhaler) if needed, but if that is needed more than 2 days per week - let me know.   Please call to schedule colonoscopy as you are due.   Follow up in 5 months for wellness exam.   IF you received an x-ray today, you will receive an invoice from [facility] Rad[facility]. Please contact [facility] Radiology at [phone] with questions or concerns regarding your invoice.   IF you received labwork today, you will receive an invoice from [place]. Please contact [facility] at [phone] with questions or concerns regarding your invoice.   Our billing staff will not be able to assist you with questions regarding bills from these companies.  You will be contacted with the lab results as soon as they are available. The fastest way to get your results is to activate your My Chart account. Instructions are located on the last page of this paperwork. If you have not heard from [place] regarding the results in 2 weeks, please contact this office.      I personally performed the services described in this documentation, which was scribed in my presence. The recorded information has been reviewed and considered for accuracy and completeness, addended by me as needed, and agree with information above.  Signed,   Merri Ray, MD Primary Care at [place].  08/19/17 12:41 PM

## 2017-08-20 ENCOUNTER — Encounter: Payer: Self-pay | Admitting: Radiology

## 2017-08-20 LAB — COMPREHENSIVE METABOLIC PANEL
ALT: 15 IU/L (ref 0–32)
AST: 28 IU/L (ref 0–40)
Albumin/Globulin Ratio: 2 (ref 1.2–2.2)
Albumin: 4.7 g/dL (ref 3.5–4.8)
Alkaline Phosphatase: 65 IU/L (ref 39–117)
BUN/Creatinine Ratio: 28 (ref 12–28)
BUN: 22 mg/dL (ref 8–27)
Bilirubin Total: 0.4 mg/dL (ref 0.0–1.2)
CO2: 23 mmol/L (ref 20–29)
Calcium: 9.7 mg/dL (ref 8.7–10.3)
Chloride: 103 mmol/L (ref 96–106)
Creatinine, Ser: 0.79 mg/dL (ref 0.57–1.00)
GFR calc Af Amer: 86 mL/min/{1.73_m2} (ref >59)
GFR calc non Af Amer: 75 mL/min/{1.73_m2} (ref >59)
Globulin, Total: 2.3 g/dL (ref 1.5–4.5)
Glucose: 105 mg/dL — ABNORMAL HIGH (ref 65–99)
Potassium: 4.4 mmol/L (ref 3.5–5.2)
Sodium: 141 mmol/L (ref 134–144)
Total Protein: 7 g/dL (ref 6.0–8.5)

## 2017-08-20 LAB — LIPID PANEL
Chol/HDL Ratio: 2.4 ratio (ref 0.0–4.4)
Cholesterol, Total: 158 mg/dL (ref 100–199)
HDL: 66 mg/dL (ref >39)
LDL Calculated: 78 mg/dL (ref 0–99)
Triglycerides: 72 mg/dL (ref 0–149)
VLDL Cholesterol Cal: 14 mg/dL (ref 5–40)

## 2017-08-23 ENCOUNTER — Other Ambulatory Visit: Payer: Self-pay

## 2017-08-23 ENCOUNTER — Ambulatory Visit: Payer: Self-pay

## 2017-08-23 ENCOUNTER — Encounter: Payer: Self-pay | Admitting: Emergency Medicine

## 2017-08-23 ENCOUNTER — Ambulatory Visit (INDEPENDENT_AMBULATORY_CARE_PROVIDER_SITE_OTHER): Payer: Medicare Other | Admitting: Emergency Medicine

## 2017-08-23 ENCOUNTER — Ambulatory Visit (INDEPENDENT_AMBULATORY_CARE_PROVIDER_SITE_OTHER): Payer: Medicare Other

## 2017-08-23 VITALS — BP 126/86 | HR 94 | Temp 98.3°F | Resp 16 | Ht 63.25 in | Wt 186.2 lb

## 2017-08-23 DIAGNOSIS — J209 Acute bronchitis, unspecified: Secondary | ICD-10-CM

## 2017-08-23 DIAGNOSIS — J45909 Unspecified asthma, uncomplicated: Secondary | ICD-10-CM | POA: Diagnosis not present

## 2017-08-23 DIAGNOSIS — R05 Cough: Secondary | ICD-10-CM | POA: Diagnosis not present

## 2017-08-23 DIAGNOSIS — R0989 Other specified symptoms and signs involving the circulatory and respiratory systems: Secondary | ICD-10-CM | POA: Diagnosis not present

## 2017-08-23 DIAGNOSIS — J440 Chronic obstructive pulmonary disease with acute lower respiratory infection: Secondary | ICD-10-CM

## 2017-08-23 DIAGNOSIS — R059 Cough, unspecified: Secondary | ICD-10-CM

## 2017-08-23 MED ORDER — METHYLPREDNISOLONE SODIUM SUCC 125 MG IJ SOLR
125.0000 mg | Freq: Once | INTRAMUSCULAR | Status: AC
  Administered 2017-08-23: 125 mg via INTRAMUSCULAR

## 2017-08-23 MED ORDER — AMOXICILLIN-POT CLAVULANATE 875-125 MG PO TABS: 1.0000 | ORAL_TABLET | Freq: Two times a day (BID) | ORAL | 0 refills | Status: AC

## 2017-08-23 MED ORDER — ALBUTEROL SULFATE (2.5 MG/3ML) 0.083% IN NEBU
2.5000 mg | INHALATION_SOLUTION | Freq: Once | RESPIRATORY_TRACT | Status: AC
  Administered 2017-08-23: 2.5 mg via RESPIRATORY_TRACT

## 2017-08-23 MED ORDER — IPRATROPIUM BROMIDE 0.02 % IN SOLN
0.5000 mg | Freq: Once | RESPIRATORY_TRACT | Status: AC
  Administered 2017-08-23: 0.5 mg via RESPIRATORY_TRACT

## 2017-08-23 MED ORDER — PREDNISONE 20 MG PO TABS: 40.0000 mg | ORAL_TABLET | Freq: Every day | ORAL | 0 refills | Status: AC

## 2017-08-23 NOTE — Telephone Encounter (Signed)
  Reason for Disposition . [1] Continuous (nonstop) coughing interferes with work or school AND [2] no improvement using cough treatment per Care Advice  Answer Assessment - Initial Assessment Questions 1. ONSET: "When did the cough begin?"      Started 2 days ago 2. SEVERITY: "How bad is the cough today?"      Moderate 3. RESPIRATORY DISTRESS: "Describe your breathing."      Shortness of breath 4. FEVER: "Do you have a fever?" If so, ask: "What is your temperature, how was it measured, and when did it start?"     Unsure 5. SPUTUM: "Describe the color of your sputum" (clear, white, yellow, green)     Yellow 6. HEMOPTYSIS: "Are you coughing up any blood?" If so ask: "How much?" (flecks, streaks, tablespoons, etc.)     No 7. CARDIAC HISTORY: "Do you have any history of heart disease?" (e.g., heart attack, congestive heart failure)      No 8. LUNG HISTORY: "Do you have any history of lung disease?"  (e.g., pulmonary embolus, asthma, emphysema)     Pneumonia and asthma 9. PE RISK FACTORS: "Do you have a history of blood clots?" (or: recent major surgery, recent prolonged travel, bedridden )     No 10. OTHER SYMPTOMS: "Do you have any other symptoms?" (e.g., runny nose, wheezing, chest pain)       Runny nose and wheezing 11. PREGNANCY: "Is there any chance you are pregnant?" "When was your last menstrual period?"       No 12. TRAVEL: "Have you traveled out of the country in the last month?" (e.g., travel history, exposures)       No  Protocols used: [place] Pt. Reports has been using inhaler without relief. Difficulty sleeping because of cough. OTC not helping.

## 2017-08-23 NOTE — Progress Notes (Signed)
[name] 71 y.o.   Chief Complaint  Patient presents with  . Cough    productive yellow mucus with chest congestion- saw Dr Carlota Raspberry 08/19/17    HISTORY OF PRESENT ILLNESS: This is a 71 y.o. female complaining of productive cough and wheezing x several days.  Cough  This is a new problem. The current episode started in the past 7 days. The problem has been gradually worsening. The problem occurs constantly. The cough is productive of sputum and productive of purulent sputum. Associated symptoms include nasal congestion, shortness of breath and wheezing. Pertinent negatives include no chest pain, chills, ear pain, eye redness, fever, headaches, heartburn, hemoptysis, rash or sore throat. Nothing aggravates the symptoms. She has tried a beta-agonist inhaler, OTC cough suppressant and steroid inhaler for the symptoms. The treatment provided mild relief. Her past medical history is significant for asthma and bronchitis.     Prior to Admission medications   Medication Sig Start Date End Date Taking? Authorizing Provider  albuterol (PROAIR HFA) 108 (90 Base) MCG/ACT inhaler Inhale 2 puffs into the lungs every 6 (six) hours as needed for wheezing or shortness of breath. 12/31/16  Yes [name], MD  atorvastatin (LIPITOR) 20 MG tablet Take 1 tablet (20 mg total) daily by mouth. 08/19/17  Yes [name], MD  Dextromethorphan-Guaifenesin 20-400 MG TABS Take by mouth daily.   Yes [provider]  diphenhydrAMINE (SOMINEX) 25 MG tablet Take 50 mg by mouth at bedtime as needed for sleep.   Yes [provider]  DM-Doxylamine-Acetaminophen (VICKS NYQUIL MULTI-SYMPTOM PO) Take 1 tablet by mouth daily.   Yes [provider]  fenofibrate (TRICOR) 145 MG tablet Take 1 tablet (145 mg total) daily by mouth. 08/19/17  Yes [name], MD  fluticasone-salmeterol (ADVAIR HFA) [phone] MCG/ACT inhaler Inhale 2 puffs 2 (two) times daily into the lungs. 08/19/17  Yes  [name], MD  GUAIFENESIN PO Take 400 mg by mouth 2 (two) times daily.   Yes [provider]  ibuprofen (ADVIL,MOTRIN) 100 MG tablet Take 100 mg by mouth every 6 (six) hours as needed.   Yes [provider]  meclizine (ANTIVERT) 25 MG tablet Take 1 tablet (25 mg total) by mouth 3 (three) times daily as needed for dizziness. 12/31/16  Yes [name], MD  Multiple Vitamin (MULTIVITAMIN) capsule Take 1 capsule by mouth daily.   Yes [provider]  olmesartan-hydrochlorothiazide (BENICAR HCT) 20-12.5 MG tablet Take 1 tablet daily by mouth. 08/19/17  Yes [name], MD  oxymetazoline (AFRIN) 0.05 % nasal spray Place 1 spray into both nostrils Nightly.   Yes [provider]  acetaminophen (TYLENOL) 325 MG tablet Take 650 mg by mouth every 6 (six) hours as needed.    [provider]    Allergies  Allergen Reactions  . Dilaudid [Hydromorphone Hcl] Itching  . Morphine And Related Nausea And Vomiting    Patient Active Problem List   Diagnosis Date Noted  . Essential hypertension 08/19/2017  . Hyperlipidemia 08/19/2017  . Cough variant asthma 04/04/2016  . Upper airway cough syndrome 04/04/2016  . Osteoarthritis of left shoulder 03/24/2012    Past Medical History:  Diagnosis Date  . Arthritis   . GERD (gastroesophageal reflux disease)    at times  . Hyperlipidemia   . Hypertension   . Peripheral vascular disease ([place])    has spider veins  . Pneumothorax, traumatic    RIGHT; associated with multiple rib fractures after  a fall    Past Surgical History:  Procedure Laterality Date  . ABDOMINAL HYSTERECTOMY  1982  . 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 BREAST SURGERY    . JOINT REPLACEMENT  2009   right knee  . JOINT REPLACEMENT  2011   left knee  . MASTECTOMY  2008   right breast  . SHOULDER ARTHROSCOPY DISTAL CLAVICLE EXCISION AND OPEN ROTATOR CUFF REPAIR  2011    Social History   Socioeconomic History  .  Marital status: Divorced    Spouse name: Not on file  . Number of children: 2  . Years of education: Not on file  . Highest education level: Not on file  Social Needs  . Financial resource strain: Not on file  . Food insecurity - worry: Not on file  . Food insecurity - inability: Not on file  . Transportation needs - medical: Not on file  . Transportation needs - non-medical: Not on file  Occupational History  . Occupation: Retired  . Occupation: Glass blower/designer    Comment: Arts development officer  Tobacco Use  . Smoking status: Never Smoker  . Smokeless tobacco: Never Used  Substance and Sexual Activity  . Alcohol use: No    Alcohol/week: 0.0 oz  . Drug use: No  . Sexual activity: Not on file  Other Topics Concern  . Not on file  Social History Narrative  . Not on file    Family History  Problem Relation Age of Onset  . Breast cancer Sister   . Breast cancer Paternal Grandmother   . Anesthesia problems Neg Hx   . Hypotension Neg Hx   . Malignant hyperthermia Neg Hx   . Pseudochol deficiency Neg Hx      Review of Systems  Constitutional: Negative.  Negative for chills and fever.  HENT: Negative.  Negative for ear pain and sore throat.   Eyes: Negative.  Negative for discharge and redness.  Respiratory: Positive for cough, shortness of breath and wheezing. Negative for hemoptysis.   Cardiovascular: Negative for chest pain.  Gastrointestinal: Negative.  Negative for abdominal pain, diarrhea, heartburn, nausea and vomiting.  Genitourinary: Negative.  Negative for hematuria.  Musculoskeletal: Negative.   Skin: Negative for rash.  Neurological: Negative.  Negative for dizziness and headaches.  Endo/Heme/Allergies: Negative.   All other systems reviewed and are negative.   Vitals:   08/23/17 1147  BP: 126/86  Pulse: 94  Resp: 16  Temp: 98.3 F (36.8 C)  SpO2: 96%    Physical Exam  Constitutional: She is oriented to person, place, and time. She appears  well-developed and well-nourished.  HENT:  Head: Normocephalic and atraumatic.  Nose: Nose normal.  Mouth/Throat: Oropharynx is clear and moist.  Eyes: Conjunctivae and EOM are normal. Pupils are equal, round, and reactive to light.  Neck: Normal range of motion. Neck supple. No JVD present. No thyromegaly present.  Cardiovascular: Normal rate, regular rhythm, normal heart sounds and intact distal pulses.  Pulmonary/Chest: Effort normal. She has wheezes (diffuse).  Abdominal: Soft. There is no tenderness.  Musculoskeletal: Normal range of motion.  Lymphadenopathy:    She has no cervical adenopathy.  Neurological: She is alert and oriented to person, place, and time. She exhibits normal muscle tone.  Skin: Skin is warm and dry. Capillary refill takes less than 2 seconds. No rash noted.  Psychiatric: She has a normal mood and affect.  Vitals reviewed.   Dg Chest 2 View  Result Date:  08/23/2017 CLINICAL DATA:  Productive cough and wheezing.  COPD. EXAM: CHEST  2 VIEW COMPARISON:  04/09/2017 chest radiograph. FINDINGS: Partially visualized left shoulder arthroplasty. Surgical clips overlie the lateral right breast. Stable cardiomediastinal silhouette with normal heart size. No pneumothorax. No pleural effusion. No pulmonary edema. No acute consolidative airspace disease. Stable chronic deformities in the lateral right mid to lower ribs with associated mild pleural-parenchymal scarring. IMPRESSION: No active cardiopulmonary disease. Electronically Signed   By: Ilona Sorrel M.D.   On: 08/23/2017 12:15    ASSESSMENT & PLAN: Estefana was seen today for cough.  Diagnoses and all orders for this visit:  Acute asthmatic bronchitis -     albuterol (PROVENTIL) (2.5 MG/3ML) 0.083% nebulizer solution 2.5 mg -     ipratropium (ATROVENT) nebulizer solution 0.5 mg -     methylPREDNISolone sodium succinate (SOLU-MEDROL) 125 mg/2 mL injection 125 mg -     DG Chest 2 View; Future  Cough  Chest  congestion  Other orders -     predniSONE (DELTASONE) 20 MG tablet; Take 2 tablets (40 mg total) daily with breakfast for 5 days by mouth. -     amoxicillin-clavulanate (AUGMENTIN) 875-125 MG tablet; Take 1 tablet 2 (two) times daily for 7 days by mouth.    Patient Instructions       IF you received an x-ray today, you will receive an invoice from [facility] Radiology. Please contact [facility] Radiology at [phone] with questions or concerns regarding your invoice.   IF you received labwork today, you will receive an invoice from [place]. Please contact [facility] at [phone] with questions or concerns regarding your invoice.   Our billing staff will not be able to assist you with questions regarding bills from these companies.  You will be contacted with the lab results as soon as they are available. The fastest way to get your results is to activate your My Chart account. Instructions are located on the last page of this paperwork. If you have not heard from [place] regarding the results in 2 weeks, please contact this office.     Acute Bronchitis, Adult Acute bronchitis is when air tubes (bronchi) in the lungs suddenly get swollen. The condition can make it hard to breathe. It can also cause these symptoms:  A cough.  Coughing up clear, yellow, or green mucus.  Wheezing.  Chest congestion.  Shortness of breath.  A fever.  Body aches.  Chills.  A sore throat.  Follow these instructions at home: Medicines  Take over-the-counter and prescription medicines only as told by your doctor.  If you were prescribed an antibiotic medicine, take it as told by your doctor. Do not stop taking the antibiotic even if you start to feel better. General instructions  Rest.  Drink enough fluids to keep your pee (urine) clear or pale yellow.  Avoid smoking and secondhand smoke. If you smoke and you need help quitting, ask your doctor. Quitting will help your lungs heal  faster.  Use an inhaler, cool mist vaporizer, or humidifier as told by your doctor.  Keep all follow-up visits as told by your doctor. This is important. How is this prevented? To lower your risk of getting this condition again:  Wash your hands often with soap and water. If you cannot use soap and water, use hand sanitizer.  Avoid contact with people who have cold symptoms.  Try not to touch your hands to your mouth, nose, or eyes.  Make sure to get the flu shot  every year.  Contact a doctor if:  Your symptoms do not get better in 2 weeks. Get help right away if:  You cough up blood.  You have chest pain.  You have very bad shortness of breath.  You become dehydrated.  You faint (pass out) or keep feeling like you are going to pass out.  You keep throwing up (vomiting).  You have a very bad headache.  Your fever or chills gets worse. This information is not intended to replace advice given to you by your health care provider. Make sure you discuss any questions you have with your health care provider. Document Released: 03/19/2008 Document Revised: 05/09/2016 Document Reviewed: 03/21/2016 Elsevier Interactive Patient Education  2017 [employer] Inc.      Agustina Caroli, MD [facility]

## 2017-08-23 NOTE — Patient Instructions (Addendum)
     IF you received an x-ray today, you will receive an invoice from [facility]. Please contact [facility] at [phone] with questions or concerns regarding your invoice.   IF you received labwork today, you will receive an invoice from [facility]. Please contact [facility] at [phone] with questions or concerns regarding your invoice.   Our billing staff will not be able to assist you with questions regarding bills from these companies.  You will be contacted with the lab results as soon as they are available. The fastest way to get your results is to activate your My Chart account. Instructions are located on the last page of this paperwork. If you have not heard from us regarding the results in 2 weeks, please contact this office.      Acute Bronchitis, Adult Acute bronchitis is when air tubes (bronchi) in the lungs suddenly get swollen. The condition can make it hard to breathe. It can also cause these symptoms:  A cough.  Coughing up clear, yellow, or green mucus.  Wheezing.  Chest congestion.  Shortness of breath.  A fever.  Body aches.  Chills.  A sore throat.  Follow these instructions at home: Medicines  Take over-the-counter and prescription medicines only as told by your doctor.  If you were prescribed an antibiotic medicine, take it as told by your doctor. Do not stop taking the antibiotic even if you start to feel better. General instructions  Rest.  Drink enough fluids to keep your pee (urine) clear or pale yellow.  Avoid smoking and secondhand smoke. If you smoke and you need help quitting, ask your doctor. Quitting will help your lungs heal faster.  Use an inhaler, cool mist vaporizer, or humidifier as told by your doctor.  Keep all follow-up visits as told by your doctor. This is important. How is this prevented? To lower your risk of getting this condition again:  Wash your hands often with soap and water. If you cannot  use soap and water, use hand sanitizer.  Avoid contact with people who have cold symptoms.  Try not to touch your hands to your mouth, nose, or eyes.  Make sure to get the flu shot every year.  Contact a doctor if:  Your symptoms do not get better in 2 weeks. Get help right away if:  You cough up blood.  You have chest pain.  You have very bad shortness of breath.  You become dehydrated.  You faint (pass out) or keep feeling like you are going to pass out.  You keep throwing up (vomiting).  You have a very bad headache.  Your fever or chills gets worse. This information is not intended to replace advice given to you by your health care provider. Make sure you discuss any questions you have with your health care provider. Document Released: 03/19/2008 Document Revised: 05/09/2016 Document Reviewed: 03/21/2016 Elsevier Interactive Patient Education  2017 Elsevier Inc.  

## 2017-08-26 ENCOUNTER — Telehealth: Payer: Self-pay | Admitting: Family Medicine

## 2017-08-26 DIAGNOSIS — Z1211 Encounter for screening for malignant neoplasm of colon: Secondary | ICD-10-CM

## 2017-08-26 NOTE — Telephone Encounter (Signed)
Called because she needs to have a referral for [facility] gastrology to have a colonoscopy.Marland Kitchen PER PEC.Marland Kitchen  PLEASE ADVISE

## 2017-08-26 NOTE — Telephone Encounter (Signed)
Referral placed.

## 2017-08-26 NOTE — Telephone Encounter (Signed)
Copied from [place] [phone]. Topic: Referral - Request >> Aug 26, 2017  2:37 PM Cleaster Corin, [place] wrote: CRM for notification. See Telephone encounter for:   08/26/17.   Reason for CRM: pt. Called because she needs to have a referral for [facility] gastrology to have a colonoscopy done please give pt. A call when done [phone]

## 2017-08-27 NOTE — Telephone Encounter (Signed)
Please advise/ S.Christopher,CMA

## 2017-08-29 NOTE — Telephone Encounter (Signed)
Referral sent and pt aware on 11/14

## 2017-08-30 ENCOUNTER — Encounter: Payer: Self-pay | Admitting: Family Medicine

## 2017-08-30 ENCOUNTER — Other Ambulatory Visit: Payer: Self-pay

## 2017-08-30 ENCOUNTER — Ambulatory Visit (INDEPENDENT_AMBULATORY_CARE_PROVIDER_SITE_OTHER): Payer: Medicare Other | Admitting: Family Medicine

## 2017-08-30 VITALS — BP 118/78 | HR 79 | Temp 97.9°F | Resp 18 | Ht 63.25 in | Wt 186.8 lb

## 2017-08-30 DIAGNOSIS — J45909 Unspecified asthma, uncomplicated: Secondary | ICD-10-CM

## 2017-08-30 NOTE — Patient Instructions (Addendum)
  Lungs sound clear today. I'm glad to hear that you're doing better with medications. Continue Advair, then albuterol if needed. If you do require albuterol more frequently, or worsening of symptoms, please return for recheck. Thanks for coming in today.   IF you received an x-ray today, you will receive an invoice from [facility]. Please contact [facility] at [phone] with questions or concerns regarding your invoice.   IF you received labwork today, you will receive an invoice from [facility]. Please contact [facility] at [phone] with questions or concerns regarding your invoice.   Our billing staff will not be able to assist you with questions regarding bills from these companies.  You will be contacted with the lab results as soon as they are available. The fastest way to get your results is to activate your My Chart account. Instructions are located on the last page of this paperwork. If you have not heard from [place] regarding the results in 2 weeks, please contact this office.

## 2017-08-30 NOTE — Progress Notes (Signed)
Subjective:  By signing my name below, I, [name], attest that this documentation has been prepared under the direction and in the presence of Adrienne Ray, MD. Electronically Signed: Moises Zimmerman, [name]. 08/30/2017 , 11:22 AM .  Patient was seen in Room 1 .   Patient ID: [name], female    DOB: Oct 24, 1945, 71 y.o.   MRN: [id number] Chief Complaint  Patient presents with  . Follow-up    patient presents for cough follow up. Patient states that she is feeling better, but some slight wheezing remains   HPI [name] is a 71 y.o. female  Here for follow up of cough. She was last seen on Nov 5th for multiple issues at that time. She has a history of asthma, treated in June for a flare. She was continued on Advair inhaler with albuterol as needed. She stated that she used albuterol only once in a while at last visit. She did have some faint end expiratory wheeze on Nov 5th. She was continued on same regime with return to clinic precautions.   She was seen again on Nov 9th by Dr. Mitchel Zimmerman. She had an albuterol and atrovent neb with solu-medrol 125 injection, then treated with prednisone 40mg  for 5 days, and augmentin 875mg  BID for 7 days for asthmatic bronchitis.   Patient states she is still using her Advair inhaler. She reports using only albuterol inhaler once since her last visit with me. She denies any side effects with the medications. She denies history of COPD. She denies history of smoking. She worked around a lot of chemicals when she was younger.   Patient Active Problem List   Diagnosis Date Noted  . Essential hypertension 08/19/2017  . Hyperlipidemia 08/19/2017  . Cough variant asthma 04/04/2016  . Upper airway cough syndrome 04/04/2016  . 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 Arthritis   . GERD (gastroesophageal reflux disease)    at times  . Hyperlipidemia   . Hypertension   . Peripheral vascular  disease ([place])    has spider veins  . Pneumothorax, traumatic    RIGHT; associated with multiple rib fractures after a fall   Past Surgical History:  Procedure Laterality Date  . ABDOMINAL HYSTERECTOMY  1982  . 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 BREAST SURGERY    . JOINT REPLACEMENT  2009   right knee  . JOINT REPLACEMENT  2011   left knee  . MASTECTOMY  2008   right breast  . SHOULDER ARTHROSCOPY DISTAL CLAVICLE EXCISION AND OPEN ROTATOR CUFF REPAIR  2011  . TOTAL SHOULDER ARTHROPLASTY Left 03/14/2012   Performed by Adrienne Corning, MD at [facility] OR   Allergies  Allergen Reactions  . Dilaudid [Hydromorphone Hcl] Itching  . Morphine And Related Nausea And Vomiting   Prior to Admission medications   Medication Sig Start Date End Date Taking? Authorizing Provider  acetaminophen (TYLENOL) 325 MG tablet Take 650 mg by mouth every 6 (six) hours as needed.   Yes [provider]  albuterol (PROAIR HFA) 108 (90 Base) MCG/ACT inhaler Inhale 2 puffs into the lungs every 6 (six) hours as needed for wheezing or shortness of breath. 12/31/16  Yes [name], MD  amoxicillin-clavulanate (AUGMENTIN) 875-125 MG tablet Take 1 tablet 2 (two) times daily for 7 days by mouth. 08/23/17 08/30/17 Yes Sagardia, Ines Bloomer, MD  atorvastatin (LIPITOR) 20 MG tablet Take 1 tablet (  20 mg total) daily by mouth. 08/19/17  Yes [name], MD  Dextromethorphan-Guaifenesin 20-400 MG TABS Take by mouth daily.   Yes [provider]  diphenhydrAMINE (SOMINEX) 25 MG tablet Take 50 mg by mouth at bedtime as needed for sleep.   Yes [provider]  DM-Doxylamine-Acetaminophen (VICKS NYQUIL MULTI-SYMPTOM PO) Take 1 tablet by mouth daily.   Yes [provider]  fenofibrate (TRICOR) 145 MG tablet Take 1 tablet (145 mg total) daily by mouth. 08/19/17  Yes [name], MD  fluticasone-salmeterol (ADVAIR HFA) [phone] MCG/ACT inhaler Inhale 2 puffs 2 (two) times daily into the  lungs. 08/19/17  Yes [name], MD  GUAIFENESIN PO Take 400 mg by mouth 2 (two) times daily.   Yes [provider]  ibuprofen (ADVIL,MOTRIN) 100 MG tablet Take 100 mg by mouth every 6 (six) hours as needed.   Yes [provider]  meclizine (ANTIVERT) 25 MG tablet Take 1 tablet (25 mg total) by mouth 3 (three) times daily as needed for dizziness. 12/31/16  Yes [name], MD  Multiple Vitamin (MULTIVITAMIN) capsule Take 1 capsule by mouth daily.   Yes [provider]  olmesartan-hydrochlorothiazide (BENICAR HCT) 20-12.5 MG tablet Take 1 tablet daily by mouth. 08/19/17  Yes [name], MD  oxymetazoline (AFRIN) 0.05 % nasal spray Place 1 spray into both nostrils Nightly.   Yes [provider]   Social History   Socioeconomic History  . Marital status: Divorced    Spouse name: Not on file  . Number of children: 2  . Years of education: Not on file  . Highest education level: Not on file  Social Needs  . Financial resource strain: Not on file  . Food insecurity - worry: Not on file  . Food insecurity - inability: Not on file  . Transportation needs - medical: Not on file  . Transportation needs - non-medical: Not on file  Occupational History  . Occupation: Retired  . Occupation: Glass blower/designer    Comment: Arts development officer  Tobacco Use  . Smoking status: Never Smoker  . Smokeless tobacco: Never Used  Substance and Sexual Activity  . Alcohol use: No    Alcohol/week: 0.0 oz  . Drug use: No  . Sexual activity: Not on file  Other Topics Concern  . Not on file  Social History Narrative  . Not on file   Review of Systems  Constitutional: Negative for chills, fatigue, fever and unexpected weight change.  Respiratory: Positive for cough and wheezing. Negative for shortness of breath.   Gastrointestinal: Negative for constipation, diarrhea, nausea and vomiting.  Skin: Negative for rash and wound.  Neurological: Negative for  dizziness, weakness and headaches.       Objective:   Physical Exam  Constitutional: She is oriented to person, place, and time. She appears well-developed and well-nourished. No distress.  HENT:  Head: Normocephalic and atraumatic.  Right Ear: Hearing, tympanic membrane, external ear and ear canal normal.  Left Ear: Hearing, tympanic membrane, external ear and ear canal normal.  Nose: Nose normal.  Mouth/Throat: Oropharynx is clear and moist. No oropharyngeal exudate.  Eyes: Conjunctivae and EOM are normal. Pupils are equal, round, and reactive to light.  Cardiovascular: Normal rate, regular rhythm, normal heart sounds and intact distal pulses.  No murmur heard. Pulmonary/Chest: Effort normal and breath sounds normal. No respiratory distress. She has no wheezes. She has no rhonchi.  Neurological: She is alert and oriented to person, place,  and time.  Skin: Skin is warm and dry. No rash noted.  Psychiatric: She has a normal mood and affect. Her behavior is normal.  Vitals reviewed.   Vitals:   08/30/17 1100  BP: 118/78  Pulse: 79  Resp: 18  Temp: 97.9 F (36.6 C)  TempSrc: Oral  SpO2: 98%  Weight: 186 lb 12.8 oz (84.7 kg)  Height: 5' 3.25" (1.607 m)      Assessment & Plan:   [name] is a 71 y.o. female Asthmatic bronchitis without complication, unspecified asthma severity, unspecified whether persistent Improving, status post prednisone, Augmentin. Has not required albuterol recently, albuterol if needed for break through symptoms, continue Advair, RTC precautions given.  No orders of the defined types were placed in this encounter.  Patient Instructions    Lungs sound clear today. I'm glad to hear that you're doing better with medications. Continue Advair, then albuterol if needed. If you do require albuterol more frequently, or worsening of symptoms, please return for recheck. Thanks for coming in today.   IF you received an x-Zimmerman today, you will receive an  invoice from [facility]. Please contact [facility] at [phone] with questions or concerns regarding your invoice.   IF you received labwork today, you will receive an invoice from [facility]. Please contact [facility] at [phone] with questions or concerns regarding your invoice.   Our billing staff will not be able to assist you with questions regarding bills from these companies.  You will be contacted with the lab results as soon as they are available. The fastest way to get your results is to activate your My Chart account. Instructions are located on the last page of this paperwork. If you have not heard from [place] regarding the results in 2 weeks, please contact this office.      I personally performed the services described in this documentation, which was scribed in my presence. The recorded information has been reviewed and considered for accuracy and completeness, addended by me as needed, and agree with information above.  Signed,   Adrienne Ray, MD [facility].  08/30/17 11:33 AM

## 2017-09-08 ENCOUNTER — Emergency Department (HOSPITAL_BASED_OUTPATIENT_CLINIC_OR_DEPARTMENT_OTHER): Payer: Medicare Other

## 2017-09-08 ENCOUNTER — Other Ambulatory Visit: Payer: Self-pay

## 2017-09-08 ENCOUNTER — Encounter (HOSPITAL_BASED_OUTPATIENT_CLINIC_OR_DEPARTMENT_OTHER): Payer: Self-pay | Admitting: Emergency Medicine

## 2017-09-08 ENCOUNTER — Emergency Department (HOSPITAL_BASED_OUTPATIENT_CLINIC_OR_DEPARTMENT_OTHER)
Admission: EM | Admit: 2017-09-08 | Discharge: 2017-09-08 | Disposition: A | Discharge status: Home / Self Care | Payer: Medicare Other | Attending: Emergency Medicine | Admitting: Emergency Medicine

## 2017-09-08 DIAGNOSIS — Z96653 Presence of artificial knee joint, bilateral: Secondary | ICD-10-CM | POA: Insufficient documentation

## 2017-09-08 DIAGNOSIS — Z96612 Presence of left artificial shoulder joint: Secondary | ICD-10-CM | POA: Insufficient documentation

## 2017-09-08 DIAGNOSIS — M25512 Pain in left shoulder: Secondary | ICD-10-CM | POA: Diagnosis not present

## 2017-09-08 DIAGNOSIS — I10 Essential (primary) hypertension: Secondary | ICD-10-CM | POA: Diagnosis not present

## 2017-09-08 MED ORDER — HYDROCODONE-ACETAMINOPHEN 5-325 MG PO TABS: 1.0000 | ORAL_TABLET | Freq: Four times a day (QID) | ORAL | 0 refills | Status: DC | PRN

## 2017-09-08 NOTE — ED Notes (Signed)
Patient transported to X-ray 

## 2017-09-08 NOTE — ED Provider Notes (Signed)
[facility] EMERGENCY DEPARTMENT Provider Note   CSN: [id number] Arrival date & time: 09/08/17  2376     History   Chief Complaint Chief Complaint  Patient presents with  . Arm Pain    HPI [name] is a 71 y.o. female.  Patient is an elderly female with a history of hypertension, hyperlipidemia, asthma and prior shoulder replacement presenting today with left-sided shoulder pain and achiness.  Patient states on Wednesday she started noticing some difficult discomfort which has worsened over the last few days and speaking to the point that she was having difficulty sleeping last night.  The pain is much worse with certain positions and moving the arm.  It starts in her shoulder and radiates down to the mid arm.  She denies any numbness or weakness.  The pain also radiates up into the left side of her neck.  She denies any chest pain, shortness of breath, sweating, nausea or vomiting.  She had tried taking Tylenol and ibuprofen at home without improvement.  Patient has had her left shoulder replaced several years ago.  She does walk a large [place] husky and holds the leash with her left hand and states the dog does jerk quite a bit but also notes 2 days prior to the pain starting she had just started watching her great grandson who is 30+ pounds and she has to pick them up to put him in the car seat.  Short of that she has had no other known injury, falls and denies any fever or infectious symptoms.   The history is provided by the patient.  Arm Pain     Past Medical History:  Diagnosis Date  . Arthritis   . GERD (gastroesophageal reflux disease)    at times  . Hyperlipidemia   . Hypertension   . Peripheral vascular disease ([place])    has spider veins  . Pneumothorax, traumatic    RIGHT; associated with multiple rib fractures after a fall    Patient Active Problem List   Diagnosis Date Noted  . Essential hypertension 08/19/2017  . Hyperlipidemia 08/19/2017    . Cough variant asthma 04/04/2016  . Upper airway cough syndrome 04/04/2016  . 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 ABDOMINAL HYSTERECTOMY  1982  . 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 BREAST SURGERY    . JOINT REPLACEMENT  2009   right knee  . JOINT REPLACEMENT  2011   left knee  . MASTECTOMY  2008   right breast  . SHOULDER ARTHROSCOPY DISTAL CLAVICLE EXCISION AND OPEN ROTATOR CUFF REPAIR  2011  . TOTAL SHOULDER ARTHROPLASTY  03/14/2012   Procedure: TOTAL SHOULDER ARTHROPLASTY;  Surgeon: Alta Corning, MD;  Location: [facility];  Service: Orthopedics;  Laterality: Left;    OB History    No data available       Home Medications    Prior to Admission medications   Medication Sig Start Date End Date Taking? Authorizing Provider  acetaminophen (TYLENOL) 325 MG tablet Take 650 mg by mouth every 6 (six) hours as needed.    [provider]  albuterol (PROAIR HFA) 108 (90 Base) MCG/ACT inhaler Inhale 2 puffs into the lungs every 6 (six) hours as needed for wheezing or shortness of breath. 12/31/16   [name], MD  atorvastatin (LIPITOR) 20 MG tablet Take 1 tablet (20 mg total) daily by mouth. 08/19/17  [name], MD  Dextromethorphan-Guaifenesin 20-400 MG TABS Take by mouth daily.    [provider]  diphenhydrAMINE (SOMINEX) 25 MG tablet Take 50 mg by mouth at bedtime as needed for sleep.    [provider]  DM-Doxylamine-Acetaminophen (VICKS NYQUIL MULTI-SYMPTOM PO) Take 1 tablet by mouth daily.    [provider]  fenofibrate (TRICOR) 145 MG tablet Take 1 tablet (145 mg total) daily by mouth. 08/19/17   [name], MD  fluticasone-salmeterol (ADVAIR HFA) [phone] MCG/ACT inhaler Inhale 2 puffs 2 (two) times daily into the lungs. 08/19/17   [name], MD  GUAIFENESIN PO Take 400 mg by mouth 2 (two) times daily.    [provider]  ibuprofen  (ADVIL,MOTRIN) 100 MG tablet Take 100 mg by mouth every 6 (six) hours as needed.    [provider]  meclizine (ANTIVERT) 25 MG tablet Take 1 tablet (25 mg total) by mouth 3 (three) times daily as needed for dizziness. 12/31/16   [name], MD  Multiple Vitamin (MULTIVITAMIN) capsule Take 1 capsule by mouth daily.    [provider]  olmesartan-hydrochlorothiazide (BENICAR HCT) 20-12.5 MG tablet Take 1 tablet daily by mouth. 08/19/17   [name], MD  oxymetazoline (AFRIN) 0.05 % nasal spray Place 1 spray into both nostrils Nightly.    [provider]    Family History Family History  Problem Relation Age of Onset  . Breast cancer Sister   . Breast cancer Paternal Grandmother   . Anesthesia problems Neg Hx   . Hypotension Neg Hx   . Malignant hyperthermia Neg Hx   . Pseudochol deficiency Neg Hx     Social History Social History   Tobacco Use  . Smoking status: Never Smoker  . Smokeless tobacco: Never Used  Substance Use Topics  . Alcohol use: No    Alcohol/week: 0.0 oz  . Drug use: No     Allergies   Dilaudid [hydromorphone hcl] and Morphine and related   Review of Systems Review of Systems  All other systems reviewed and are negative.    Physical Exam Updated Vital Signs BP 137/77 (BP Location: Left Arm)   Pulse 75   Temp 97.8 F (36.6 C)   Resp 18   Ht 5\' 3"  (1.6 m)   Wt 84.4 kg (186 lb)   SpO2 99%   BMI 32.95 kg/m   Physical Exam  Constitutional: She is oriented to person, place, and time. She appears well-developed and well-nourished. No distress.  HENT:  Head: Normocephalic and atraumatic.  Mouth/Throat: Oropharynx is clear and moist.  Eyes: Conjunctivae and EOM are normal. Pupils are equal, round, and reactive to light.  Neck: Normal range of motion. Neck supple.  Cardiovascular: Normal rate, regular rhythm and intact distal pulses.  No murmur heard. Pulmonary/Chest: Effort normal and breath sounds normal. No  respiratory distress. She has no wheezes. She has no rales.  Musculoskeletal: She exhibits tenderness. She exhibits no edema.       Left shoulder: She exhibits decreased range of motion, tenderness, pain and spasm. She exhibits normal pulse.       Arms: Neurological: She is alert and oriented to person, place, and time.  Skin: Skin is warm and dry. No rash noted. No erythema.  Psychiatric: She has a normal mood and affect. Her behavior is normal.  Nursing note and vitals reviewed.    ED Treatments / Results  Labs (all labs ordered are listed, but only abnormal  results are displayed) Labs Reviewed - No data to display  EKG  EKG Interpretation  Date/Time:  "Sunday September 08 2017 08:28:17 EST Ventricular Rate:  74 PR Interval:    QRS Duration: 84 QT Interval:  371 QTC Calculation: 412 R Axis:   65 Text Interpretation:  Sinus rhythm Low voltage, precordial leads Normal ECG No significant change since last tracing Confirmed by Plunkett, Whitney ([id number]) on 09/08/2017 8:49:59 AM       Radiology Dg Shoulder Left  Result Date: 09/08/2017 CLINICAL DATA:  Acute left shoulder pain without known injury. EXAM: LEFT SHOULDER - 2+ VIEW COMPARISON:  Radiograph of Mar 14, 2012. FINDINGS: Status post left shoulder arthroplasty. No fracture or dislocation is noted. No soft tissue abnormality is noted. IMPRESSION: Status post left shoulder arthroplasty. No acute abnormality seen in the left shoulder. Electronically Signed   By: James  Green Jr, M.D.   On: 09/08/2017 09:02    Procedures Procedures (including critical care time)  Medications Ordered in ED Medications - No data to display   Initial Impression / Assessment and Plan / ED Course  I have reviewed the triage vital signs and the nursing notes.  Pertinent labs & imaging results that were available during my care of the patient were reviewed by me and considered in my medical decision making (see chart for details).     Patient  presenting with pain in her left shoulder.  She has had a prior shoulder replacement of that joint.  Most likely from overuse from recently starting to watch her great grandson and having to lift him into his car seat.  Patient's grandson is more than 30 pounds.  There is no evidence to suggest joint infection and she denies any other type of trauma.  Low suspicion the patient's symptoms are cardiac in nature.  Plain films are pending  9:33 AM Imaging is neg. patient given pain control and she will follow-up with Dr. Graves for further intervention.  Final Clinical Impressions(s) / ED Diagnoses   Final diagnoses:  Acute pain of left shoulder    ED Discharge Orders        Ordered    HYDROcodone-acetaminophen (NORCO/VICODIN) 5-325 MG tablet  Every 6 hours PRN     11" /25/18 1155       Blanchie Dessert, MD 09/08/17 [phone]

## 2017-09-08 NOTE — Discharge Instructions (Signed)
In addition to the pain meds continue to take ibuprofen 400mg  every 6 hours for inflammation.

## 2017-09-08 NOTE — ED Triage Notes (Signed)
L arm pain since Wednesday, denies injury.

## 2017-09-09 ENCOUNTER — Encounter (HOSPITAL_BASED_OUTPATIENT_CLINIC_OR_DEPARTMENT_OTHER): Payer: Self-pay | Admitting: *Deleted

## 2017-09-09 ENCOUNTER — Emergency Department (HOSPITAL_BASED_OUTPATIENT_CLINIC_OR_DEPARTMENT_OTHER)
Admission: EM | Admit: 2017-09-09 | Discharge: 2017-09-09 | Disposition: A | Discharge status: Home / Self Care | Payer: Medicare Other | Attending: Emergency Medicine | Admitting: Emergency Medicine

## 2017-09-09 ENCOUNTER — Other Ambulatory Visit: Payer: Self-pay

## 2017-09-09 DIAGNOSIS — M4722 Other spondylosis with radiculopathy, cervical region: Secondary | ICD-10-CM | POA: Diagnosis not present

## 2017-09-09 DIAGNOSIS — I10 Essential (primary) hypertension: Secondary | ICD-10-CM | POA: Diagnosis not present

## 2017-09-09 DIAGNOSIS — R112 Nausea with vomiting, unspecified: Secondary | ICD-10-CM

## 2017-09-09 DIAGNOSIS — Z96612 Presence of left artificial shoulder joint: Secondary | ICD-10-CM | POA: Diagnosis not present

## 2017-09-09 DIAGNOSIS — M25512 Pain in left shoulder: Secondary | ICD-10-CM | POA: Diagnosis not present

## 2017-09-09 DIAGNOSIS — M6281 Muscle weakness (generalized): Secondary | ICD-10-CM | POA: Diagnosis not present

## 2017-09-09 DIAGNOSIS — Z96653 Presence of artificial knee joint, bilateral: Secondary | ICD-10-CM | POA: Insufficient documentation

## 2017-09-09 DIAGNOSIS — M542 Cervicalgia: Secondary | ICD-10-CM | POA: Diagnosis not present

## 2017-09-09 DIAGNOSIS — Z79899 Other long term (current) drug therapy: Secondary | ICD-10-CM | POA: Insufficient documentation

## 2017-09-09 DIAGNOSIS — R030 Elevated blood-pressure reading, without diagnosis of hypertension: Secondary | ICD-10-CM | POA: Diagnosis not present

## 2017-09-09 DIAGNOSIS — R110 Nausea: Secondary | ICD-10-CM | POA: Diagnosis present

## 2017-09-09 DIAGNOSIS — R531 Weakness: Secondary | ICD-10-CM

## 2017-09-09 LAB — COMPREHENSIVE METABOLIC PANEL
ALT: 20 U/L (ref 14–54)
AST: 24 U/L (ref 15–41)
Albumin: 4.5 g/dL (ref 3.5–5.0)
Alkaline Phosphatase: 56 U/L (ref 38–126)
Anion gap: 11 (ref 5–15)
BUN: 20 mg/dL (ref 6–20)
CO2: 20 mmol/L — ABNORMAL LOW (ref 22–32)
Calcium: 9.9 mg/dL (ref 8.9–10.3)
Chloride: 105 mmol/L (ref 101–111)
Creatinine, Ser: 0.83 mg/dL (ref 0.44–1.00)
GFR calc Af Amer: >60 mL/min (ref >60)
GFR calc non Af Amer: >60 mL/min (ref >60)
Glucose, Bld: 162 mg/dL — ABNORMAL HIGH (ref 65–99)
Potassium: 3.6 mmol/L (ref 3.5–5.1)
Sodium: 136 mmol/L (ref 135–145)
Total Bilirubin: 0.8 mg/dL (ref 0.3–1.2)
Total Protein: 7.5 g/dL (ref 6.5–8.1)

## 2017-09-09 LAB — CBC WITH DIFFERENTIAL/PLATELET
Basophils Absolute: 0 10*3/uL (ref 0.0–0.1)
Basophils Relative: 0 %
Eosinophils Absolute: 0 10*3/uL (ref 0.0–0.7)
Eosinophils Relative: 0 %
HCT: 37 % (ref 36.0–46.0)
Hemoglobin: 12.6 g/dL (ref 12.0–15.0)
Lymphocytes Relative: 11 %
Lymphs Abs: 0.6 10*3/uL — ABNORMAL LOW (ref 0.7–4.0)
MCH: 31.7 pg (ref 26.0–34.0)
MCHC: 34.1 g/dL (ref 30.0–36.0)
MCV: 93.2 fL (ref 78.0–100.0)
Monocytes Absolute: 0 10*3/uL — ABNORMAL LOW (ref 0.1–1.0)
Monocytes Relative: 1 %
Neutro Abs: 4.4 10*3/uL (ref 1.7–7.7)
Neutrophils Relative %: 88 %
Platelets: 354 10*3/uL (ref 150–400)
RBC: 3.97 MIL/uL (ref 3.87–5.11)
RDW: 11.3 % — ABNORMAL LOW (ref 11.5–15.5)
WBC: 5 10*3/uL (ref 4.0–10.5)

## 2017-09-09 LAB — TROPONIN I: Troponin I: <0.03 ng/mL (ref <0.03)

## 2017-09-09 MED ORDER — FAMOTIDINE IN NACL 20-0.9 MG/50ML-% IV SOLN
20.0000 mg | Freq: Once | INTRAVENOUS | Status: AC
  Administered 2017-09-09: 20 mg via INTRAVENOUS
  Filled 2017-09-09: qty 50

## 2017-09-09 MED ORDER — SODIUM CHLORIDE 0.9 % IV BOLUS (SEPSIS)
500.0000 mL | Freq: Once | INTRAVENOUS | Status: AC
  Administered 2017-09-09: 500 mL via INTRAVENOUS

## 2017-09-09 MED ORDER — PROMETHAZINE HCL 25 MG PO TABS: 25.0000 mg | ORAL_TABLET | Freq: Four times a day (QID) | ORAL | 0 refills | Status: DC | PRN

## 2017-09-09 MED ORDER — ONDANSETRON HCL 4 MG/2ML IJ SOLN
4.0000 mg | Freq: Once | INTRAMUSCULAR | Status: AC
  Administered 2017-09-09: 4 mg via INTRAVENOUS
  Filled 2017-09-09: qty 2

## 2017-09-09 MED ORDER — ONDANSETRON 4 MG PO TBDP: 4.0000 mg | ORAL_TABLET | ORAL | 0 refills | Status: DC | PRN

## 2017-09-09 MED ORDER — FAMOTIDINE 20 MG PO TABS: 20.0000 mg | ORAL_TABLET | Freq: Two times a day (BID) | ORAL | 0 refills | Status: DC

## 2017-09-09 MED ORDER — PROMETHAZINE HCL 25 MG/ML IJ SOLN
12.5000 mg | Freq: Once | INTRAMUSCULAR | Status: AC
  Administered 2017-09-09: 12.5 mg via INTRAVENOUS
  Filled 2017-09-09: qty 1

## 2017-09-09 NOTE — ED Notes (Signed)
ED Provider at bedside. 

## 2017-09-09 NOTE — ED Provider Notes (Signed)
[facility] EMERGENCY DEPARTMENT Provider Note   CSN: [id number] Arrival date & time: 09/09/17  1634     History   Chief Complaint Chief Complaint  Patient presents with  . Emesis    HPI [name] is a 71 y.o. female.  HPI Reports that she started having pain in her left shoulder 5 days ago.  She reports that she has been having trouble with that shoulder in certain positions.  It radiates down to her mid arm.  She reports she also notes some pain that radiates up to the left side of her neck.  Patient came to the emergency department yesterday because she thought she needed a cortisone injection.  She was given Vicodin for pain control and followed up with her orthopod yesterday morning and got a cortisone injection as well as a course of prednisone.  Reports that yesterday evening she started getting a lot of nausea and since then she has had nausea with dry heaves.  This morning she reports she really was not able to eat anything and she got feeling very weak while she was doing laundry and sank to the floor.  She had no loss of consciousness no chest pain no shortness of breath.  She continues to feel nauseated with dry heaves.  She reports that she has taken both prednisone and Vicodin in the past with no adverse side effects.  She reports that she finished a course of Augmentin about a week ago abdominal pain or nausea after that. Past Medical History:  Diagnosis Date  . Arthritis   . GERD (gastroesophageal reflux disease)    at times  . Hyperlipidemia   . Hypertension   . Peripheral vascular disease ([place])    has spider veins  . Pneumothorax, traumatic    RIGHT; associated with multiple rib fractures after a fall    Patient Active Problem List   Diagnosis Date Noted  . Essential hypertension 08/19/2017  . Hyperlipidemia 08/19/2017  . Cough variant asthma 04/04/2016  . Upper airway cough syndrome 04/04/2016  . Osteoarthritis of left shoulder 03/24/2012     Past Surgical History:  Procedure Laterality Date  . ABDOMINAL HYSTERECTOMY  1982  . 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 BREAST SURGERY    . JOINT REPLACEMENT  2009   right knee  . JOINT REPLACEMENT  2011   left knee  . MASTECTOMY  2008   right breast  . SHOULDER ARTHROSCOPY DISTAL CLAVICLE EXCISION AND OPEN ROTATOR CUFF REPAIR  2011  . TOTAL SHOULDER ARTHROPLASTY  03/14/2012   Procedure: TOTAL SHOULDER ARTHROPLASTY;  Surgeon: Alta Corning, MD;  Location: [facility];  Service: Orthopedics;  Laterality: Left;    OB History    No data available       Home Medications    Prior to Admission medications   Medication Sig Start Date End Date Taking? Authorizing Provider  predniSONE (DELTASONE) 20 MG tablet Take 20 mg by mouth daily with breakfast.   Yes [provider]  acetaminophen (TYLENOL) 325 MG tablet Take 650 mg by mouth every 6 (six) hours as needed.    [provider]  albuterol (PROAIR HFA) 108 (90 Base) MCG/ACT inhaler Inhale 2 puffs into the lungs every 6 (six) hours as needed for wheezing or shortness of breath. 12/31/16   [name], MD  atorvastatin (LIPITOR) 20 MG tablet Take 1 tablet (20 mg total) daily by mouth. 08/19/17   [name],  MD  Dextromethorphan-Guaifenesin 20-400 MG TABS Take by mouth daily.    [provider]  diphenhydrAMINE (SOMINEX) 25 MG tablet Take 50 mg by mouth at bedtime as needed for sleep.    [provider]  DM-Doxylamine-Acetaminophen (VICKS NYQUIL MULTI-SYMPTOM PO) Take 1 tablet by mouth daily.    [provider]  famotidine (PEPCID) 20 MG tablet Take 1 tablet (20 mg total) by mouth 2 (two) times daily. 09/09/17   Charlesetta Shanks, MD  fenofibrate (TRICOR) 145 MG tablet Take 1 tablet (145 mg total) daily by mouth. 08/19/17   [name], MD  fluticasone-salmeterol (ADVAIR HFA) [phone] MCG/ACT inhaler Inhale 2 puffs 2 (two) times daily into the lungs. 08/19/17   [name], MD   GUAIFENESIN PO Take 400 mg by mouth 2 (two) times daily.    [provider]  HYDROcodone-acetaminophen (NORCO/VICODIN) 5-325 MG tablet Take 1-2 tablets by mouth every 6 (six) hours as needed for severe pain. 09/08/17   Blanchie Dessert, MD  ibuprofen (ADVIL,MOTRIN) 100 MG tablet Take 100 mg by mouth every 6 (six) hours as needed.    [provider]  meclizine (ANTIVERT) 25 MG tablet Take 1 tablet (25 mg total) by mouth 3 (three) times daily as needed for dizziness. 12/31/16   [name], MD  Multiple Vitamin (MULTIVITAMIN) capsule Take 1 capsule by mouth daily.    [provider]  olmesartan-hydrochlorothiazide (BENICAR HCT) 20-12.5 MG tablet Take 1 tablet daily by mouth. 08/19/17   [name], MD  ondansetron (ZOFRAN ODT) 4 MG disintegrating tablet Take 1 tablet (4 mg total) by mouth every 4 (four) hours as needed for nausea or vomiting. 09/09/17   Charlesetta Shanks, MD  oxymetazoline (AFRIN) 0.05 % nasal spray Place 1 spray into both nostrils Nightly.    [provider]  promethazine (PHENERGAN) 25 MG tablet Take 1 tablet (25 mg total) by mouth every 6 (six) hours as needed for nausea or vomiting. 09/09/17   Charlesetta Shanks, MD    Family History Family History  Problem Relation Age of Onset  . Breast cancer Sister   . Breast cancer Paternal Grandmother   . Anesthesia problems Neg Hx   . Hypotension Neg Hx   . Malignant hyperthermia Neg Hx   . Pseudochol deficiency Neg Hx     Social History Social History   Tobacco Use  . Smoking status: Never Smoker  . Smokeless tobacco: Never Used  Substance Use Topics  . Alcohol use: No    Alcohol/week: 0.0 oz  . Drug use: No     Allergies   Dilaudid [hydromorphone hcl] and Morphine and related   Review of Systems Review of Systems  10 Systems reviewed and are negative for acute change except as noted in the HPI.  Physical Exam Updated Vital Signs BP (!) 141/81   Pulse 81   Temp 98.3  F (36.8 C) (Oral)   Resp 18   SpO2 95%   Physical Exam  Constitutional: She is oriented to person, place, and time. She appears well-developed and well-nourished. No distress.  HENT:  Head: Normocephalic and atraumatic.  Nose: Nose normal.  Mouth/Throat: Oropharynx is clear and moist.  Eyes: Conjunctivae and EOM are normal.  Neck: Neck supple.  Tenderness in left trapezius and paraspinous muscle bodies  Cardiovascular: Normal rate, regular rhythm, normal heart sounds and intact distal pulses.  No murmur heard. Pulmonary/Chest: Effort normal and breath sounds normal. No respiratory distress.  Abdominal: Soft. She exhibits no distension.  There is no tenderness. There is no guarding.  Musculoskeletal: She exhibits no edema.  Tenderness over point of left shoulder and upper arm.  No deformity or erythema.  Neurological: She is alert and oriented to person, place, and time. No cranial nerve deficit. She exhibits normal muscle tone. Coordination normal.  Skin: Skin is warm and dry.  Psychiatric: She has a normal mood and affect.  Nursing note and vitals reviewed.    ED Treatments / Results  Labs (all labs ordered are listed, but only abnormal results are displayed) Labs Reviewed  CBC WITH DIFFERENTIAL/PLATELET - Abnormal; Notable for the following components:      Result Value   RDW 11.3 (*)    Lymphs Abs 0.6 (*)    Monocytes Absolute 0.0 (*)    All other components within normal limits  COMPREHENSIVE METABOLIC PANEL - Abnormal; Notable for the following components:   CO2 20 (*)    Glucose, Bld 162 (*)    All other components within normal limits  TROPONIN I    EKG  EKG Interpretation None       Radiology Dg Shoulder Left  Result Date: 09/08/2017 CLINICAL DATA:  Acute left shoulder pain without known injury. EXAM: LEFT SHOULDER - 2+ VIEW COMPARISON:  Radiograph of Mar 14, 2012. FINDINGS: Status post left shoulder arthroplasty. No fracture or dislocation is noted. No  soft tissue abnormality is noted. IMPRESSION: Status post left shoulder arthroplasty. No acute abnormality seen in the left shoulder. Electronically Signed   By: Marijo Conception, M.D.   On: 09/08/2017 09:02    Procedures Procedures (including critical care time)  Medications Ordered in ED Medications  ondansetron (ZOFRAN) injection 4 mg (4 mg Intravenous Given 09/09/17 1648)  sodium chloride 0.9 % bolus 500 mL (0 mLs Intravenous Stopped 09/09/17 1840)  famotidine (PEPCID) IVPB 20 mg premix (0 mg Intravenous Stopped 09/09/17 1840)  promethazine (PHENERGAN) injection 12.5 mg (12.5 mg Intravenous Given 09/09/17 1841)     Initial Impression / Assessment and Plan / ED Course  I have reviewed the triage vital signs and the nursing notes.  Pertinent labs & imaging results that were available during my care of the patient were reviewed by me and considered in my medical decision making (see chart for details).      Final Clinical Impressions(s) / ED Diagnoses   Final diagnoses:  Non-intractable vomiting with nausea, unspecified vomiting type  Generalized weakness   Patient developed nausea and dry heaves after starting treatment for shoulder pain.  She has had problems her shoulder recurrently and a cortisone injection yesterday.  She does not have other associated symptoms of chest pain or abdominal pain.  Diagnostic workup within normal limits.  Symptoms appear most likely to be medication related.  Patient does report she has tolerated Vicodin and prednisone in the past.  This time however she does feel shoulder pain is improving after her injection, she will try to discontinue the Vicodin and prednisone at this time.  Is getting relief with Zofran and Phenergan.  Return precautions reviewed. ED Discharge Orders        Ordered    famotidine (PEPCID) 20 MG tablet  2 times daily     09/09/17 1915    ondansetron (ZOFRAN ODT) 4 MG disintegrating tablet  Every 4 hours PRN     09/09/17 1915      promethazine (PHENERGAN) 25 MG tablet  Every 6 hours PRN     09/09/17 1915  Charlesetta Shanks, MD 09/09/17 1919

## 2017-09-09 NOTE — ED Notes (Signed)
Family at bedside. 

## 2017-09-09 NOTE — ED Triage Notes (Addendum)
Brought in by EMS from Home, c/o n/v x 1 day, pt taking vicodin for pain w/o food, also saw ortho today for arm pain and receive cortisone injection , seen here yesterday for arm pain

## 2017-09-13 ENCOUNTER — Ambulatory Visit
Admission: RE | Admit: 2017-09-13 | Discharge: 2017-09-13 | Disposition: A | Discharge status: Home / Self Care | Payer: Medicare Other | Source: Ambulatory Visit | Attending: Family Medicine | Admitting: Family Medicine

## 2017-09-13 ENCOUNTER — Other Ambulatory Visit: Payer: Self-pay | Admitting: Family Medicine

## 2017-09-13 DIAGNOSIS — Z1239 Encounter for other screening for malignant neoplasm of breast: Secondary | ICD-10-CM

## 2017-09-13 DIAGNOSIS — Z1231 Encounter for screening mammogram for malignant neoplasm of breast: Secondary | ICD-10-CM | POA: Diagnosis not present

## 2017-09-19 DIAGNOSIS — M67912 Unspecified disorder of synovium and tendon, left shoulder: Secondary | ICD-10-CM | POA: Diagnosis not present

## 2017-09-19 DIAGNOSIS — M542 Cervicalgia: Secondary | ICD-10-CM | POA: Diagnosis not present

## 2018-01-11 ENCOUNTER — Other Ambulatory Visit: Payer: Self-pay

## 2018-01-11 ENCOUNTER — Emergency Department (HOSPITAL_BASED_OUTPATIENT_CLINIC_OR_DEPARTMENT_OTHER): Payer: Medicare Other

## 2018-01-11 ENCOUNTER — Encounter (HOSPITAL_BASED_OUTPATIENT_CLINIC_OR_DEPARTMENT_OTHER): Payer: Self-pay | Admitting: Emergency Medicine

## 2018-01-11 ENCOUNTER — Emergency Department (HOSPITAL_BASED_OUTPATIENT_CLINIC_OR_DEPARTMENT_OTHER)
Admission: EM | Admit: 2018-01-11 | Discharge: 2018-01-11 | Disposition: A | Discharge status: Home / Self Care | Payer: Medicare Other | Attending: Emergency Medicine | Admitting: Emergency Medicine

## 2018-01-11 DIAGNOSIS — Y929 Unspecified place or not applicable: Secondary | ICD-10-CM | POA: Insufficient documentation

## 2018-01-11 DIAGNOSIS — S62340A Nondisplaced fracture of base of second metacarpal bone, right hand, initial encounter for closed fracture: Secondary | ICD-10-CM | POA: Diagnosis not present

## 2018-01-11 DIAGNOSIS — Y939 Activity, unspecified: Secondary | ICD-10-CM | POA: Diagnosis not present

## 2018-01-11 DIAGNOSIS — Z79899 Other long term (current) drug therapy: Secondary | ICD-10-CM | POA: Insufficient documentation

## 2018-01-11 DIAGNOSIS — W010XXA Fall on same level from slipping, tripping and stumbling without subsequent striking against object, initial encounter: Secondary | ICD-10-CM | POA: Insufficient documentation

## 2018-01-11 DIAGNOSIS — S6991XA Unspecified injury of right wrist, hand and finger(s), initial encounter: Secondary | ICD-10-CM | POA: Diagnosis present

## 2018-01-11 DIAGNOSIS — I10 Essential (primary) hypertension: Secondary | ICD-10-CM | POA: Diagnosis not present

## 2018-01-11 DIAGNOSIS — S62346A Nondisplaced fracture of base of fifth metacarpal bone, right hand, initial encounter for closed fracture: Secondary | ICD-10-CM | POA: Diagnosis not present

## 2018-01-11 DIAGNOSIS — Y999 Unspecified external cause status: Secondary | ICD-10-CM | POA: Insufficient documentation

## 2018-01-11 DIAGNOSIS — S92355A Nondisplaced fracture of fifth metatarsal bone, left foot, initial encounter for closed fracture: Secondary | ICD-10-CM | POA: Diagnosis not present

## 2018-01-11 NOTE — ED Triage Notes (Signed)
[address] last week and injured right  hand, pain and swelling to hand

## 2018-01-12 NOTE — ED Provider Notes (Signed)
[facility] EMERGENCY DEPARTMENT Provider Note   CSN: [id number] Arrival date & time: 01/11/18  1223     History   Chief Complaint Chief Complaint  Patient presents with  . Hand Injury    HPI [name] is a 72 y.o. female.  72 year old female with past medical history below who presents with right hand pain.  Last week, she tripped and fell, hitting her right hand.  She initially had significant swelling and pain on her dorsal hand near her fifth finger.  She reports that the swelling has improved but she continues to have pain with using her hand.  She denies any numbness or weakness of her fingers.  No other injuries from the fall.  She has been applying biofreeze/icy hot which has helped.  The history is provided by the patient.  Hand Injury      Past Medical History:  Diagnosis Date  . Arthritis   . GERD (gastroesophageal reflux disease)    at times  . Hyperlipidemia   . Hypertension   . Peripheral vascular disease ([place])    has spider veins  . Pneumothorax, traumatic    RIGHT; associated with multiple rib fractures after a fall    Patient Active Problem List   Diagnosis Date Noted  . Essential hypertension 08/19/2017  . Hyperlipidemia 08/19/2017  . Cough variant asthma 04/04/2016  . Upper airway cough syndrome 04/04/2016  . 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 ABDOMINAL HYSTERECTOMY  1982  . AUGMENTATION MAMMAPLASTY Bilateral    Patient has bilateral implants   . 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 BREAST SURGERY    . JOINT REPLACEMENT  2009   right knee  . JOINT REPLACEMENT  2011   left knee  . MASTECTOMY  2008   right breast  . REDUCTION MAMMAPLASTY Left    Patient had lift with implant on Left side   . SHOULDER ARTHROSCOPY DISTAL CLAVICLE EXCISION AND OPEN ROTATOR CUFF REPAIR  2011  . TOTAL SHOULDER ARTHROPLASTY  03/14/2012   Procedure: TOTAL SHOULDER ARTHROPLASTY;   Surgeon: Alta Corning, MD;  Location: [facility];  Service: Orthopedics;  Laterality: Left;     OB History   None      Home Medications    Prior to Admission medications   Medication Sig Start Date End Date Taking? Authorizing Provider  albuterol (PROAIR HFA) 108 (90 Base) MCG/ACT inhaler Inhale 2 puffs into the lungs every 6 (six) hours as needed for wheezing or shortness of breath. 12/31/16  Yes [name], MD  atorvastatin (LIPITOR) 20 MG tablet Take 1 tablet (20 mg total) daily by mouth. 08/19/17  Yes [name], MD  famotidine (PEPCID) 20 MG tablet Take 1 tablet (20 mg total) by mouth 2 (two) times daily. 09/09/17  Yes Charlesetta Shanks, MD  fenofibrate (TRICOR) 145 MG tablet Take 1 tablet (145 mg total) daily by mouth. 08/19/17  Yes [name], MD  fluticasone-salmeterol (ADVAIR HFA) [phone] MCG/ACT inhaler Inhale 2 puffs 2 (two) times daily into the lungs. 08/19/17  Yes [name], MD  GUAIFENESIN PO Take 400 mg by mouth 2 (two) times daily.   Yes [provider]  Multiple Vitamin (MULTIVITAMIN) capsule Take 1 capsule by mouth daily.   Yes [provider]  olmesartan-hydrochlorothiazide (BENICAR HCT) 20-12.5 MG tablet Take 1 tablet daily by mouth. 08/19/17  Yes [name], MD  oxymetazoline (AFRIN) 0.05 % nasal spray Place 1 spray into both nostrils Nightly.   Yes [provider]  promethazine (PHENERGAN) 25 MG tablet Take 1 tablet (25 mg total) by mouth every 6 (six) hours as needed for nausea or vomiting. 09/09/17  Yes Charlesetta Shanks, MD  acetaminophen (TYLENOL) 325 MG tablet Take 650 mg by mouth every 6 (six) hours as needed.    [provider]  Dextromethorphan-Guaifenesin 20-400 MG TABS Take by mouth daily.    [provider]  diphenhydrAMINE (SOMINEX) 25 MG tablet Take 50 mg by mouth at bedtime as needed for sleep.    [provider]  DM-Doxylamine-Acetaminophen (VICKS NYQUIL MULTI-SYMPTOM PO) Take 1  tablet by mouth daily.    [provider]  HYDROcodone-acetaminophen (NORCO/VICODIN) 5-325 MG tablet Take 1-2 tablets by mouth every 6 (six) hours as needed for severe pain. 09/08/17   Blanchie Dessert, MD  ibuprofen (ADVIL,MOTRIN) 100 MG tablet Take 100 mg by mouth every 6 (six) hours as needed.    [provider]  meclizine (ANTIVERT) 25 MG tablet Take 1 tablet (25 mg total) by mouth 3 (three) times daily as needed for dizziness. 12/31/16   [name], MD  ondansetron (ZOFRAN ODT) 4 MG disintegrating tablet Take 1 tablet (4 mg total) by mouth every 4 (four) hours as needed for nausea or vomiting. 09/09/17   Charlesetta Shanks, MD  predniSONE (DELTASONE) 20 MG tablet Take 20 mg by mouth daily with breakfast.    [provider]    Family History Family History  Problem Relation Age of Onset  . Breast cancer Sister   . Breast cancer Paternal Grandmother   . Anesthesia problems Neg Hx   . Hypotension Neg Hx   . Malignant hyperthermia Neg Hx   . Pseudochol deficiency Neg Hx     Social History Social History   Tobacco Use  . Smoking status: Never Smoker  . Smokeless tobacco: Never Used  Substance Use Topics  . Alcohol use: No    Alcohol/week: 0.0 oz  . Drug use: No     Allergies   Dilaudid [hydromorphone hcl] and Morphine and related   Review of Systems Review of Systems  Musculoskeletal: Positive for joint swelling.  Skin: Negative for wound.  Neurological: Negative for weakness and numbness.     Physical Exam Updated Vital Signs BP (!) 142/76 (BP Location: Left Arm)   Pulse 86   Temp 99.2 F (37.3 C) (Oral)   Resp 18   Ht 5\' 4"  (1.626 m)   Wt 83.9 kg (185 lb)   SpO2 96%   BMI 31.76 kg/m   Physical Exam  Constitutional: She is oriented to person, place, and time. She appears well-developed and well-nourished. No distress.  HENT:  Head: Normocephalic and atraumatic.  Eyes: Conjunctivae are normal.  Neck: Neck supple.    Cardiovascular: Intact distal pulses.  Musculoskeletal: Normal range of motion.  R hand: Normal ROM fingers and wrist; mild edema, ecchymosis, and tenderness on dorsal hand over mid to proximal 5th metacarpal  Neurological: She is alert and oriented to person, place, and time. No sensory deficit.  Skin: Skin is warm and dry.  Psychiatric: She has a normal mood and affect. Judgment normal.  Nursing note and vitals reviewed.    ED Treatments / Results  Labs (all labs ordered are listed, but only abnormal results are displayed) Labs Reviewed - No data to display  EKG None  Radiology Dg Hand Complete Right  Result Date:  01/11/2018 CLINICAL DATA:  Fall, pain EXAM: RIGHT HAND - COMPLETE 3+ VIEW COMPARISON:  None. FINDINGS: Generalized osteopenia. Nondisplaced transverse fracture at the base of the fifth metacarpal. Mild osteoarthritis of the first IP joint, first CMC joint and first MCP joint. Mild osteoarthritis of the scaphotrapeziotrapezoid joint. Mild osteoarthritis of the third MCP joint. IMPRESSION: Nondisplaced transverse fracture at the base of the fifth metacarpal. Electronically Signed   By: Kathreen Devoid   On: 01/11/2018 13:24    Procedures Procedures (including critical care time)  Medications Ordered in ED Medications - No data to display   Initial Impression / Assessment and Plan / ED Course  I have reviewed the triage vital signs and the nursing notes.  Pertinent imaging results that were available during my care of the patient were reviewed by me and considered in my medical decision making (see chart for details).     XR shows nondisplaced transverse fx at base of 5th metacarpal. Neurovascularly intact. She already follows w/ [facility], will have her f/u with their clinic this week. Placed in ulnar gutter splint, discussed supportive measures.  Final Clinical Impressions(s) / ED Diagnoses   Final diagnoses:  Closed nondisplaced fracture of base of fifth  metacarpal bone of right hand, initial encounter    ED Discharge Orders    None       Little, Wenda Overland, MD 01/12/18 1454

## 2018-01-16 DIAGNOSIS — S62316A Displaced fracture of base of fifth metacarpal bone, right hand, initial encounter for closed fracture: Secondary | ICD-10-CM | POA: Diagnosis not present

## 2018-02-11 DIAGNOSIS — S62316A Displaced fracture of base of fifth metacarpal bone, right hand, initial encounter for closed fracture: Secondary | ICD-10-CM | POA: Diagnosis not present

## 2018-02-24 DIAGNOSIS — M545 Low back pain: Secondary | ICD-10-CM | POA: Diagnosis not present

## 2018-02-24 DIAGNOSIS — M5416 Radiculopathy, lumbar region: Secondary | ICD-10-CM | POA: Diagnosis not present

## 2018-03-06 DIAGNOSIS — M545 Low back pain: Secondary | ICD-10-CM | POA: Diagnosis not present

## 2018-03-07 DIAGNOSIS — M5416 Radiculopathy, lumbar region: Secondary | ICD-10-CM | POA: Diagnosis not present

## 2018-03-24 DIAGNOSIS — M5416 Radiculopathy, lumbar region: Secondary | ICD-10-CM | POA: Diagnosis not present

## 2018-04-14 ENCOUNTER — Encounter: Payer: Self-pay | Admitting: Family Medicine

## 2018-04-14 ENCOUNTER — Ambulatory Visit (INDEPENDENT_AMBULATORY_CARE_PROVIDER_SITE_OTHER): Payer: Medicare Other | Admitting: Family Medicine

## 2018-04-14 VITALS — BP 158/82 | HR 85 | Temp 97.8°F | Ht 64.0 in | Wt 185.2 lb

## 2018-04-14 DIAGNOSIS — J4520 Mild intermittent asthma, uncomplicated: Secondary | ICD-10-CM | POA: Diagnosis not present

## 2018-04-14 DIAGNOSIS — E785 Hyperlipidemia, unspecified: Secondary | ICD-10-CM

## 2018-04-14 DIAGNOSIS — H8110 Benign paroxysmal vertigo, unspecified ear: Secondary | ICD-10-CM

## 2018-04-14 DIAGNOSIS — Z0000 Encounter for general adult medical examination without abnormal findings: Secondary | ICD-10-CM | POA: Diagnosis not present

## 2018-04-14 DIAGNOSIS — I10 Essential (primary) hypertension: Secondary | ICD-10-CM

## 2018-04-14 DIAGNOSIS — R062 Wheezing: Secondary | ICD-10-CM

## 2018-04-14 MED ORDER — OLMESARTAN MEDOXOMIL-HCTZ 20-12.5 MG PO TABS: 1.0000 | ORAL_TABLET | Freq: Every day | ORAL | 5 refills | Status: DC

## 2018-04-14 MED ORDER — ZOSTER VAC RECOMB ADJUVANTED 50 MCG/0.5ML IM SUSR: 0.5000 mL | Freq: Once | INTRAMUSCULAR | 1 refills | Status: AC

## 2018-04-14 MED ORDER — ALBUTEROL SULFATE HFA 108 (90 BASE) MCG/ACT IN AERS: 2.0000 | INHALATION_SPRAY | Freq: Four times a day (QID) | RESPIRATORY_TRACT | 0 refills | Status: DC | PRN

## 2018-04-14 MED ORDER — FLUTICASONE-SALMETEROL 115-21 MCG/ACT IN AERO: 2.0000 | INHALATION_SPRAY | Freq: Two times a day (BID) | RESPIRATORY_TRACT | 5 refills | Status: DC

## 2018-04-14 MED ORDER — MECLIZINE HCL 25 MG PO TABS: 25.0000 mg | ORAL_TABLET | Freq: Three times a day (TID) | ORAL | 0 refills | Status: DC | PRN

## 2018-04-14 MED ORDER — FENOFIBRATE 145 MG PO TABS: 145.0000 mg | ORAL_TABLET | Freq: Every day | ORAL | 5 refills | Status: DC

## 2018-04-14 MED ORDER — ATORVASTATIN CALCIUM 20 MG PO TABS: 20.0000 mg | ORAL_TABLET | Freq: Every day | ORAL | 5 refills | Status: DC

## 2018-04-14 NOTE — Patient Instructions (Addendum)
Please follow up in the next few weeks back on blood pressure medication to see if changes are needed.   Please schedule colonoscopy.  I do recommend meeting with dentist.  Let me know if you need a referral.   Thanks for coming in today.   Preventive Care 40 Years and Older, Female Preventive care refers to lifestyle choices and visits with your health care provider that can promote health and wellness. What does preventive care include?  A yearly physical exam. This is also called an annual well check.  Dental exams once or twice a year.  Routine eye exams. Ask your health care provider how often you should have your eyes checked.  Personal lifestyle choices, including: ? Daily care of your teeth and gums. ? Regular physical activity. ? Eating a healthy diet. ? Avoiding tobacco and drug use. ? Limiting alcohol use. ? Practicing safe sex. ? Taking low-dose aspirin every day. ? Taking vitamin and mineral supplements as recommended by your health care provider. What happens during an annual well check? The services and screenings done by your health care provider during your annual well check will depend on your age, overall health, lifestyle risk factors, and family history of disease. Counseling Your health care provider may ask you questions about your:  Alcohol use.  Tobacco use.  Drug use.  Emotional well-being.  Home and relationship well-being.  Sexual activity.  Eating habits.  History of falls.  Memory and ability to understand (cognition).  Work and work Statistician.  Reproductive health.  Screening You may have the following tests or measurements:  Height, weight, and BMI.  Blood pressure.  Lipid and cholesterol levels. These may be checked every 5 years, or more frequently if you are over 34 years old.  Skin check.  Lung cancer screening. You may have this screening every year starting at age 51 if you have a 30-pack-year history of smoking  and currently smoke or have quit within the past 15 years.  Fecal occult blood test (FOBT) of the stool. You may have this test every year starting at age 70.  Flexible sigmoidoscopy or colonoscopy. You may have a sigmoidoscopy every 5 years or a colonoscopy every 10 years starting at age 71.  Hepatitis C blood test.  Hepatitis B blood test.  Sexually transmitted disease (STD) testing.  Diabetes screening. This is done by checking your blood sugar (glucose) after you have not eaten for a while (fasting). You may have this done every 1-3 years.  Bone density scan. This is done to screen for osteoporosis. You may have this done starting at age 74.  Mammogram. This may be done every 1-2 years. Talk to your health care provider about how often you should have regular mammograms.  Talk with your health care provider about your test results, treatment options, and if necessary, the need for more tests. Vaccines Your health care provider may recommend certain vaccines, such as:  Influenza vaccine. This is recommended every year.  Tetanus, diphtheria, and acellular pertussis (Tdap, Td) vaccine. You may need a Td booster every 10 years.  Varicella vaccine. You may need this if you have not been vaccinated.  Zoster vaccine. You may need this after age 64.  Measles, mumps, and rubella (MMR) vaccine. You may need at least one dose of MMR if you were born in 1957 or later. You may also need a second dose.  Pneumococcal 13-valent conjugate (PCV13) vaccine. One dose is recommended after age 26.  Pneumococcal polysaccharide (  PPSV23) vaccine. One dose is recommended after age 86.  Meningococcal vaccine. You may need this if you have certain conditions.  Hepatitis A vaccine. You may need this if you have certain conditions or if you travel or work in places where you may be exposed to hepatitis A.  Hepatitis B vaccine. You may need this if you have certain conditions or if you travel or work in  places where you may be exposed to hepatitis B.  Haemophilus influenzae type b (Hib) vaccine. You may need this if you have certain conditions.  Talk to your health care provider about which screenings and vaccines you need and how often you need them. This information is not intended to replace advice given to you by your health care provider. Make sure you discuss any questions you have with your health care provider. Document Released: 10/28/2015 Document Revised: 06/20/2016 Document Reviewed: 08/02/2015 Elsevier Interactive Patient Education  2018 [employer].   IF you received an x-ray today, you will receive an invoice from [facility]. Please contact [facility] at [phone] with questions or concerns regarding your invoice.   IF you received labwork today, you will receive an invoice from [facility]. Please contact [facility] at [phone] with questions or concerns regarding your invoice.   Our billing staff will not be able to assist you with questions regarding bills from these companies.  You will be contacted with the lab results as soon as they are available. The fastest way to get your results is to activate your My Chart account. Instructions are located on the last page of this paperwork. If you have not heard from [place] regarding the results in 2 weeks, please contact this office.

## 2018-04-14 NOTE — Progress Notes (Signed)
Subjective:    Patient ID: [name], female    DOB: 08/20/1946, 72 y.o.   MRN: [id number]  HPI [name] is a 72 y.o. female Presents today for: Chief Complaint  Patient presents with  . Annual Exam    CPE   Here for physical.  History of asthma, cough variant asthma, hypertension, hyperlipidemia.  Rare dizziness - has taken meclizine once every few moths for inner ear symptoms or vertigo sxs, usually resolves with one pill.   Asthma: Evaluated in November 2018 for asthmatic bronchitis treated with Solu-Medrol, prednisone, Augmentin.  Uses Advair inhaler and albuterol as needed for rescue inhaler, rare use of albuterol, daily use of Advair.   Hyperlipidemia: She takes Lipitor 20 mg daily, TriCor 145 mg daily. No new myalgias. Discussed risk of myopathy with combo with understanding expressed.  Lab Results  Component Value Date   CHOL 158 08/19/2017   HDL 66 08/19/2017   LDLCALC 78 08/19/2017   TRIG 72 08/19/2017   CHOLHDL 2.4 08/19/2017   Lab Results  Component Value Date   ALT 20 09/09/2017   AST 24 09/09/2017   ALKPHOS 56 09/09/2017   BILITOT 0.8 09/09/2017   Hypertension: She takes Benicar HCT 20/12.5 mg daily.  Somewhat elevated past few visits. Missed a few pills recently.  Took last one 4 days ago. Has not measured readings on meds.   BP Readings from Last 3 Encounters:  04/14/18 (!) 158/82  01/11/18 (!) 142/76  09/09/17 (!) 141/81   Lab Results  Component Value Date   CREATININE 0.83 09/09/2017   Cancer screening: Colonoscopy: 2008 by report. Has not yet scheduled. Plans on scheduling.  Mammogram: 09/13/17  Immunizations: Immunization History  Administered Date(s) Administered  . Influenza-Unspecified 08/15/2016, 08/17/2017  . Tdap 02/02/2014  pneumonia vaccine performed at [facility].  No hx of shingles vaccine - possible shingles over 10 years ago. Agrees to vaccine.   Fall screening: No falls in the past year.  Memory  screen: 6CIT Screen 04/14/2018  What Year? 0 points  What month? 0 points  What time? 0 points  Count back from 20 0 points  Months in reverse 0 points  Repeat phrase 2 points  Total Score 2     Functional status: Functional Status Survey: Is the patient deaf or have difficulty hearing?: No Does the patient have difficulty seeing, even when wearing glasses/contacts?: No Does the patient have difficulty concentrating, remembering, or making decisions?: No Does the patient have difficulty walking or climbing stairs?: No Does the patient have difficulty dressing or bathing?: No Does the patient have difficulty doing errands alone such as visiting a doctor's office or shopping?: No  Depression screening: Depression screen [facility] 2/9 04/14/2018 08/30/2017 08/23/2017 08/19/2017 04/09/2017  Decreased Interest 0 0 0 0 0  Down, Depressed, Hopeless 0 0 0 0 0  PHQ - 2 Score 0 0 0 0 0   Vision screen:  Visual Acuity Screening   Right eye Left eye Both eyes  Without correction:     With correction: 20/30-2 20/50-1 20/25-1  optho eval 2 years ago. Plans to schedule appt.   Dental: Had dentist years ago. No recent visit.   Exercise: Walking and time with grandson.   Advanced directives: Has living will and healthcare power of attorney   Patient Active Problem List   Diagnosis Date Noted  . Essential hypertension 08/19/2017  . Hyperlipidemia 08/19/2017  . Cough variant asthma 04/04/2016  . Upper airway cough syndrome 04/04/2016  .  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 Arthritis   . GERD (gastroesophageal reflux disease)    at times  . Hyperlipidemia   . Hypertension   . Peripheral vascular disease ([place])    has spider veins  . Pneumothorax, traumatic    RIGHT; associated with multiple rib fractures after a fall   Past Surgical History:  Procedure Laterality Date  . ABDOMINAL HYSTERECTOMY  1982  . AUGMENTATION MAMMAPLASTY  Bilateral    Patient has bilateral implants   . 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 BREAST SURGERY    . JOINT REPLACEMENT  2009   right knee  . JOINT REPLACEMENT  2011   left knee  . MASTECTOMY  2008   right breast  . REDUCTION MAMMAPLASTY Left    Patient had lift with implant on Left side   . SHOULDER ARTHROSCOPY DISTAL CLAVICLE EXCISION AND OPEN ROTATOR CUFF REPAIR  2011  . TOTAL SHOULDER ARTHROPLASTY  03/14/2012   Procedure: TOTAL SHOULDER ARTHROPLASTY;  Surgeon: Alta Corning, MD;  Location: [place];  Service: Orthopedics;  Laterality: Left;   Allergies  Allergen Reactions  . Dilaudid [Hydromorphone Hcl] Itching  . Morphine And Related Nausea And Vomiting   Prior to Admission medications   Medication Sig Start Date End Date Taking? Authorizing Provider  acetaminophen (TYLENOL) 325 MG tablet Take 650 mg by mouth every 6 (six) hours as needed.   Yes [provider]  albuterol (PROAIR HFA) 108 (90 Base) MCG/ACT inhaler Inhale 2 puffs into the lungs every 6 (six) hours as needed for wheezing or shortness of breath. 12/31/16  Yes [name], MD  atorvastatin (LIPITOR) 20 MG tablet Take 1 tablet (20 mg total) daily by mouth. 08/19/17  Yes [name], MD  Dextromethorphan-Guaifenesin 20-400 MG TABS Take by mouth daily.   Yes [provider]  diphenhydrAMINE (SOMINEX) 25 MG tablet Take 50 mg by mouth at bedtime as needed for sleep.   Yes [provider]  DM-Doxylamine-Acetaminophen (VICKS NYQUIL MULTI-SYMPTOM PO) Take 1 tablet by mouth daily.   Yes [provider]  famotidine (PEPCID) 20 MG tablet Take 1 tablet (20 mg total) by mouth 2 (two) times daily. 09/09/17  Yes Charlesetta Shanks, MD  fenofibrate (TRICOR) 145 MG tablet Take 1 tablet (145 mg total) daily by mouth. 08/19/17  Yes [name], MD  fluticasone-salmeterol (ADVAIR HFA) [phone] MCG/ACT inhaler Inhale 2 puffs 2 (two) times daily into the lungs. 08/19/17  Yes [name], MD  GUAIFENESIN PO Take 400 mg by mouth 2 (two) times daily.   Yes [provider]  ibuprofen (ADVIL,MOTRIN) 100 MG tablet Take 100 mg by mouth every 6 (six) hours as needed.   Yes [provider]  meclizine (ANTIVERT) 25 MG tablet Take 1 tablet (25 mg total) by mouth 3 (three) times daily as needed for dizziness. 12/31/16  Yes [name], MD  Multiple Vitamin (MULTIVITAMIN) capsule Take 1 capsule by mouth daily.   Yes [provider]  olmesartan-hydrochlorothiazide (BENICAR HCT) 20-12.5 MG tablet Take 1 tablet daily by mouth. 08/19/17  Yes [name], MD  oxymetazoline (AFRIN) 0.05 % nasal spray Place 1 spray into both nostrils Nightly.   Yes [provider]  gabapentin (NEURONTIN) 300 MG capsule TK 1 C PO TID 03/07/18   [provider]  methocarbamol (ROBAXIN) 750 MG tablet TK 1 T PO Q 8 H PRF  SPASM 04/02/18   [provider]   Social History   Socioeconomic History  . Marital status: Divorced    Spouse name: Not on file  . Number of children: 2  . Years of education: Not on file  . Highest education level: Not on file  Occupational History  . Occupation: Retired  . Occupation: Glass blower/designer    Comment: Arts development officer  Social Needs  . Financial resource strain: Not on file  . Food insecurity:    Worry: Not on file    Inability: Not on file  . Transportation needs:    Medical: Not on file    Non-medical: Not on file  Tobacco Use  . Smoking status: Never Smoker  . Smokeless tobacco: Never Used  Substance and Sexual Activity  . Alcohol use: No    Alcohol/week: 0.0 oz  . Drug use: No  . Sexual activity: Not on file  Lifestyle  . Physical activity:    Days per week: Not on file    Minutes per session: Not on file  . Stress: Not on file  Relationships  . Social connections:    Talks on phone: Not on file    Gets together: Not on file    Attends religious service: Not on file    Active member of  club or organization: Not on file    Attends meetings of clubs or organizations: Not on file    Relationship status: Not on file  . Intimate partner violence:    Fear of current or ex partner: Not on file    Emotionally abused: Not on file    Physically abused: Not on file    Forced sexual activity: Not on file  Other Topics Concern  . Not on file  Social History Narrative  . Not on file    Review of Systems  13 point review of systems per patient health survey noted.  Negative other than as above and hx of back and joint pain - not new - managed by back specialist and PM&R.       Objective:   Physical Exam  Constitutional: She is oriented to person, place, and time. She appears well-developed and well-nourished.  HENT:  Head: Normocephalic and atraumatic.  Right Ear: External ear normal.  Left Ear: External ear normal.  Mouth/Throat: Oropharynx is clear and moist.  Eyes: Pupils are equal, round, and reactive to light. Conjunctivae are normal.  Neck: Normal range of motion. Neck supple. No thyromegaly present.  Cardiovascular: Normal rate, regular rhythm, normal heart sounds and intact distal pulses.  No murmur heard. Pulmonary/Chest: Effort normal and breath sounds normal. No respiratory distress. She has no wheezes.  Abdominal: Soft. Bowel sounds are normal. There is no tenderness.  Musculoskeletal: Normal range of motion. She exhibits no edema or tenderness.  Lymphadenopathy:    She has no cervical adenopathy.  Neurological: She is alert and oriented to person, place, and time.  Skin: Skin is warm and dry. No rash noted.  Psychiatric: She has a normal mood and affect. Her behavior is normal. Thought content normal.   Vitals:   04/14/18 1542 04/14/18 1546  BP: (!) 178/79 (!) 158/82  Pulse: 85   Temp: 97.8 F (36.6 C)   TempSrc: Oral   SpO2: 100%   Weight: 185 lb 3.2 oz (84 kg)   Height: 5' 4"  (1.626 m)       Assessment & Plan:   [name] is a 72 y.o.  female  Medicare wellness exam:  - - anticipatory guidance as below in AVS, screening labs if needed. Health maintenance items as above in HPI discussed/recommended as applicable.   - no concerning responses on depression, fall, or functional status screening. Any positive responses noted as above. Advanced directives discussed as in [facility].    Wheezing - Plan: fluticasone-salmeterol (ADVAIR HFA) 115-21 MCG/ACT inhaler Mild intermittent asthma without complication - Plan: albuterol (PROAIR HFA) 108 (90 Base) MCG/ACT inhaler  -Overall stable with current regimen, continue Advair same dose, albuterol if needed for breakthrough symptoms.  Hyperlipidemia, unspecified hyperlipidemia type - Plan: fenofibrate (TRICOR) 145 MG tablet, atorvastatin (LIPITOR) 20 MG tablet  -Tolerating current combination of TriCor and Lipitor.  Potential risks of myopathy discussed with combination, denies any symptoms and understanding expressed.  Benign paroxysmal positional vertigo, unspecified laterality - Plan: meclizine (ANTIVERT) 25 MG tablet  -Rare vertigo symptoms, quickly improved with meclizine.  Refilled meclizine for as needed use.  Essential hypertension - Plan: olmesartan-hydrochlorothiazide (BENICAR HCT) 20-12.5 MG tablet   - elevated  In office, likely due to missed doses.  Stressed importance of compliance and recheck next few weeks to determine control  Meds ordered this encounter  Medications  . fluticasone-salmeterol (ADVAIR HFA) 115-21 MCG/ACT inhaler    Sig: Inhale 2 puffs into the lungs 2 (two) times daily.    Dispense:  1 Inhaler    Refill:  5  . fenofibrate (TRICOR) 145 MG tablet    Sig: Take 1 tablet (145 mg total) by mouth daily.    Dispense:  30 tablet    Refill:  5  . atorvastatin (LIPITOR) 20 MG tablet    Sig: Take 1 tablet (20 mg total) by mouth daily.    Dispense:  30 tablet    Refill:  5  . albuterol (PROAIR HFA) 108 (90 Base) MCG/ACT inhaler    Sig: Inhale 2 puffs into the lungs  every 6 (six) hours as needed for wheezing or shortness of breath.    Dispense:  1 Inhaler    Refill:  0  . meclizine (ANTIVERT) 25 MG tablet    Sig: Take 1 tablet (25 mg total) by mouth 3 (three) times daily as needed for dizziness.    Dispense:  30 tablet    Refill:  0  . olmesartan-hydrochlorothiazide (BENICAR HCT) 20-12.5 MG tablet    Sig: Take 1 tablet by mouth daily.    Dispense:  30 tablet    Refill:  5  . Zoster Vaccine Adjuvanted [facility]) injection    Sig: Inject 0.5 mLs into the muscle once for 1 dose. Repeat in 2-6 months.    Dispense:  0.5 mL    Refill:  1   Patient Instructions   Please follow up in the next few weeks back on blood pressure medication to see if changes are needed.   Please schedule colonoscopy.  I do recommend meeting with dentist.  Let me know if you need a referral.   Thanks for coming in today.   Preventive Care 35 Years and Older, Female Preventive care refers to lifestyle choices and visits with your health care provider that can promote health and wellness. What does preventive care include?  A yearly physical exam. This is also called an annual well check.  Dental exams once or twice a year.  Routine eye exams. Ask your health care provider how often you should have your eyes checked.  Personal lifestyle choices, including: ? Daily care of your teeth and gums. ?  Regular physical activity. ? Eating a healthy diet. ? Avoiding tobacco and drug use. ? Limiting alcohol use. ? Practicing safe sex. ? Taking low-dose aspirin every day. ? Taking vitamin and mineral supplements as recommended by your health care provider. What happens during an annual well check? The services and screenings done by your health care provider during your annual well check will depend on your age, overall health, lifestyle risk factors, and family history of disease. Counseling Your health care provider may ask you questions about your:  Alcohol  use.  Tobacco use.  Drug use.  Emotional well-being.  Home and relationship well-being.  Sexual activity.  Eating habits.  History of falls.  Memory and ability to understand (cognition).  Work and work Statistician.  Reproductive health.  Screening You may have the following tests or measurements:  Height, weight, and BMI.  Blood pressure.  Lipid and cholesterol levels. These may be checked every 5 years, or more frequently if you are over 10 years old.  Skin check.  Lung cancer screening. You may have this screening every year starting at age 30 if you have a 30-pack-year history of smoking and currently smoke or have quit within the past 15 years.  Fecal occult blood test (FOBT) of the stool. You may have this test every year starting at age 18.  Flexible sigmoidoscopy or colonoscopy. You may have a sigmoidoscopy every 5 years or a colonoscopy every 10 years starting at age 50.  Hepatitis C blood test.  Hepatitis B blood test.  Sexually transmitted disease (STD) testing.  Diabetes screening. This is done by checking your blood sugar (glucose) after you have not eaten for a while (fasting). You may have this done every 1-3 years.  Bone density scan. This is done to screen for osteoporosis. You may have this done starting at age 67.  Mammogram. This may be done every 1-2 years. Talk to your health care provider about how often you should have regular mammograms.  Talk with your health care provider about your test results, treatment options, and if necessary, the need for more tests. Vaccines Your health care provider may recommend certain vaccines, such as:  Influenza vaccine. This is recommended every year.  Tetanus, diphtheria, and acellular pertussis (Tdap, Td) vaccine. You may need a Td booster every 10 years.  Varicella vaccine. You may need this if you have not been vaccinated.  Zoster vaccine. You may need this after age 55.  Measles, mumps, and  rubella (MMR) vaccine. You may need at least one dose of MMR if you were born in 1957 or later. You may also need a second dose.  Pneumococcal 13-valent conjugate (PCV13) vaccine. One dose is recommended after age 44.  Pneumococcal polysaccharide (PPSV23) vaccine. One dose is recommended after age 82.  Meningococcal vaccine. You may need this if you have certain conditions.  Hepatitis A vaccine. You may need this if you have certain conditions or if you travel or work in places where you may be exposed to hepatitis A.  Hepatitis B vaccine. You may need this if you have certain conditions or if you travel or work in places where you may be exposed to hepatitis B.  Haemophilus influenzae type b (Hib) vaccine. You may need this if you have certain conditions.  Talk to your health care provider about which screenings and vaccines you need and how often you need them. This information is not intended to replace advice given to you by your health care provider.  Make sure you discuss any questions you have with your health care provider. Document Released: 10/28/2015 Document Revised: 06/20/2016 Document Reviewed: 08/02/2015 Elsevier Interactive Patient Education  2018 [employer].   IF you received an x-ray today, you will receive an invoice from [facility]. Please contact [facility] at [phone] with questions or concerns regarding your invoice.   IF you received labwork today, you will receive an invoice from [place]. Please contact [facility] at [phone] with questions or concerns regarding your invoice.   Our billing staff will not be able to assist you with questions regarding bills from these companies.  You will be contacted with the lab results as soon as they are available. The fastest way to get your results is to activate your My Chart account. Instructions are located on the last page of this paperwork. If you have not heard from [place] regarding the results in  2 weeks, please contact this office.       Signed,   Merri Ray, MD [facility] [place].  04/17/18 7:27 AM

## 2018-04-17 ENCOUNTER — Encounter: Payer: Self-pay | Admitting: Family Medicine

## 2018-04-29 DIAGNOSIS — M5416 Radiculopathy, lumbar region: Secondary | ICD-10-CM | POA: Diagnosis not present

## 2018-05-12 ENCOUNTER — Encounter: Payer: Self-pay | Admitting: Family Medicine

## 2018-05-12 ENCOUNTER — Ambulatory Visit (INDEPENDENT_AMBULATORY_CARE_PROVIDER_SITE_OTHER): Payer: Medicare Other | Admitting: Family Medicine

## 2018-05-12 ENCOUNTER — Other Ambulatory Visit: Payer: Self-pay

## 2018-05-12 VITALS — BP 140/74 | HR 72 | Temp 98.3°F | Ht 64.0 in | Wt 187.0 lb

## 2018-05-12 DIAGNOSIS — E785 Hyperlipidemia, unspecified: Secondary | ICD-10-CM | POA: Diagnosis not present

## 2018-05-12 DIAGNOSIS — I10 Essential (primary) hypertension: Secondary | ICD-10-CM

## 2018-05-12 NOTE — Patient Instructions (Addendum)
  Please return in next few days for fasting lab visit only. Blood pressure looks better today.   Thanks you for coming in today.    IF you received an x-ray today, you will receive an invoice from [facility]. Please contact [facility] at [phone] with questions or concerns regarding your invoice.   IF you received labwork today, you will receive an invoice from [place]. Please contact [facility] at [phone] with questions or concerns regarding your invoice.   Our billing staff will not be able to assist you with questions regarding bills from these companies.  You will be contacted with the lab results as soon as they are available. The fastest way to get your results is to activate your My Chart account. Instructions are located on the last page of this paperwork. If you have not heard from [place] regarding the results in 2 weeks, please contact this office.

## 2018-05-12 NOTE — Progress Notes (Signed)
Subjective:    Patient ID: [name], female    DOB: 1946-04-30, 72 y.o.   MRN: [id number]  HPI [name] is a 72 y.o. female Presents today for: Chief Complaint  Patient presents with  . Hypertension    1 month f/u    Hypertension: BP Readings from Last 3 Encounters:  05/12/18 140/74  04/14/18 (!) 158/82  01/11/18 (!) 142/76   Lab Results  Component Value Date   CREATININE 0.83 09/09/2017   Uncontrolled on prior testing and when seen July 1st, but had been missing some doses of medication.  Stressed importance of continued dosing of her Benicar HCT, but no changes to dose at that time.   Has been taking meds daily now. No missed doses. No new side effects on daily dosing. No home readings.   Hyperlipidemia:  Lab Results  Component Value Date   CHOL 158 08/19/2017   HDL 66 08/19/2017   LDLCALC 78 08/19/2017   TRIG 72 08/19/2017   CHOLHDL 2.4 08/19/2017   Lab Results  Component Value Date   ALT 20 09/09/2017   AST 24 09/09/2017   ALKPHOS 56 09/09/2017   BILITOT 0.8 09/09/2017   See last office visit.  She takes Lipitor and TriCor without new myalgias.  Risk of myopathy with combination was discussed but tolerating current regimen. Still doing ok, no new myalgias. Not fasting. Last ate 4 hours ago.    Patient Active Problem List   Diagnosis Date Noted  . Essential hypertension 08/19/2017  . Hyperlipidemia 08/19/2017  . Cough variant asthma 04/04/2016  . Upper airway cough syndrome 04/04/2016  . 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 Arthritis   . GERD (gastroesophageal reflux disease)    at times  . Hyperlipidemia   . Hypertension   . Peripheral vascular disease ([place])    has spider veins  . Pneumothorax, traumatic    RIGHT; associated with multiple rib fractures after a fall   Past Surgical History:  Procedure Laterality Date  . ABDOMINAL HYSTERECTOMY  1982  . AUGMENTATION MAMMAPLASTY  Bilateral    Patient has bilateral implants   . 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 BREAST SURGERY    . JOINT REPLACEMENT  2009   right knee  . JOINT REPLACEMENT  2011   left knee  . MASTECTOMY  2008   right breast  . REDUCTION MAMMAPLASTY Left    Patient had lift with implant on Left side   . SHOULDER ARTHROSCOPY DISTAL CLAVICLE EXCISION AND OPEN ROTATOR CUFF REPAIR  2011  . TOTAL SHOULDER ARTHROPLASTY  03/14/2012   Procedure: TOTAL SHOULDER ARTHROPLASTY;  Surgeon: Alta Corning, MD;  Location: [facility];  Service: Orthopedics;  Laterality: Left;   Allergies  Allergen Reactions  . Dilaudid [Hydromorphone Hcl] Itching  . Morphine And Related Nausea And Vomiting   Prior to Admission medications   Medication Sig Start Date End Date Taking? Authorizing Provider  acetaminophen (TYLENOL) 325 MG tablet Take 650 mg by mouth every 6 (six) hours as needed.   Yes [provider]  albuterol (PROAIR HFA) 108 (90 Base) MCG/ACT inhaler Inhale 2 puffs into the lungs every 6 (six) hours as needed for wheezing or shortness of breath. 04/14/18  Yes [name], MD  atorvastatin (LIPITOR) 20 MG tablet Take 1 tablet (20 mg total) by mouth daily. 04/14/18  Yes [name], MD  Dextromethorphan-Guaifenesin 20-400 MG  TABS Take by mouth daily.   Yes [provider]  diphenhydrAMINE (SOMINEX) 25 MG tablet Take 50 mg by mouth at bedtime as needed for sleep.   Yes [provider]  DM-Doxylamine-Acetaminophen (VICKS NYQUIL MULTI-SYMPTOM PO) Take 1 tablet by mouth daily.   Yes [provider]  famotidine (PEPCID) 20 MG tablet Take 1 tablet (20 mg total) by mouth 2 (two) times daily. 09/09/17  Yes Charlesetta Shanks, MD  fenofibrate (TRICOR) 145 MG tablet Take 1 tablet (145 mg total) by mouth daily. 04/14/18  Yes [name], MD  fluticasone-salmeterol (ADVAIR HFA) [phone] MCG/ACT inhaler Inhale 2 puffs into the lungs 2 (two) times daily. 04/14/18  Yes [name], MD   gabapentin (NEURONTIN) 300 MG capsule TK 1 C PO TID 03/07/18  Yes [provider]  GUAIFENESIN PO Take 400 mg by mouth 2 (two) times daily.   Yes [provider]  ibuprofen (ADVIL,MOTRIN) 100 MG tablet Take 100 mg by mouth every 6 (six) hours as needed.   Yes [provider]  meclizine (ANTIVERT) 25 MG tablet Take 1 tablet (25 mg total) by mouth 3 (three) times daily as needed for dizziness. 04/14/18  Yes [name], MD  methocarbamol (ROBAXIN) 750 MG tablet TK 1 T PO Q 8 H PRF SPASM 04/02/18  Yes [provider]  Multiple Vitamin (MULTIVITAMIN) capsule Take 1 capsule by mouth daily.   Yes [provider]  olmesartan-hydrochlorothiazide (BENICAR HCT) 20-12.5 MG tablet Take 1 tablet by mouth daily. 04/14/18  Yes [name], MD  oxymetazoline (AFRIN) 0.05 % nasal spray Place 1 spray into both nostrils Nightly.   Yes [provider]   Social History   Socioeconomic History  . Marital status: Divorced    Spouse name: Not on file  . Number of children: 2  . Years of education: Not on file  . Highest education level: Not on file  Occupational History  . Occupation: Retired  . Occupation: Glass blower/designer    Comment: Arts development officer  Social Needs  . Financial resource strain: Not on file  . Food insecurity:    Worry: Not on file    Inability: Not on file  . Transportation needs:    Medical: Not on file    Non-medical: Not on file  Tobacco Use  . Smoking status: Never Smoker  . Smokeless tobacco: Never Used  Substance and Sexual Activity  . Alcohol use: No    Alcohol/week: 0.0 oz  . Drug use: No  . Sexual activity: Not on file  Lifestyle  . Physical activity:    Days per week: Not on file    Minutes per session: Not on file  . Stress: Not on file  Relationships  . Social connections:    Talks on phone: Not on file    Gets together: Not on file    Attends religious service: Not on file    Active member of  club or organization: Not on file    Attends meetings of clubs or organizations: Not on file    Relationship status: Not on file  . Intimate partner violence:    Fear of current or ex partner: Not on file    Emotionally abused: Not on file    Physically abused: Not on file    Forced sexual activity: Not on file  Other Topics Concern  . Not on file  Social History Narrative  . Not on file    Review of  Systems  Constitutional: Negative for fatigue and unexpected weight change.  Respiratory: Negative for chest tightness and shortness of breath.   Cardiovascular: Negative for chest pain, palpitations and leg swelling.  Gastrointestinal: Negative for abdominal pain and blood in stool.  Musculoskeletal: Negative for myalgias.  Neurological: Negative for dizziness, syncope, light-headedness and headaches.       Objective:   Physical Exam  Constitutional: She is oriented to person, place, and time. She appears well-developed and well-nourished.  HENT:  Head: Normocephalic and atraumatic.  Eyes: Pupils are equal, round, and reactive to light. Conjunctivae and EOM are normal.  Neck: Carotid bruit is not present.  Cardiovascular: Normal rate, regular rhythm, normal heart sounds and intact distal pulses.  Pulmonary/Chest: Effort normal and breath sounds normal.  Abdominal: Soft. She exhibits no pulsatile midline mass. There is no tenderness.  Neurological: She is alert and oriented to person, place, and time.  Skin: Skin is warm and dry.  Psychiatric: She has a normal mood and affect. Her behavior is normal.  Vitals reviewed.  Vitals:   05/12/18 1545 05/12/18 1546  BP: (!) 144/74 140/74  Pulse: 72   Temp: 98.3 F (36.8 C)   TempSrc: Oral   SpO2: 95%   Weight: 187 lb (84.8 kg)   Height: 5\' 4"  (1.626 m)       Assessment & Plan:  [name] is a 72 y.o. female Essential hypertension - Plan: Comprehensive metabolic panel  -Improved on steady dosing of medication, without  any side effects.  Check CMP, lipids at upcoming fasting lab visit.  No change in doses for now.  Hyperlipidemia, unspecified hyperlipidemia type - Plan: Comprehensive metabolic panel, Lipid panel  -Tolerating combination of statin and TriCor as above without signs or symptoms of myalgias.  Increased risk was discussed.  No change in regimen for now, fasting labs pending  No orders of the defined types were placed in this encounter.  Patient Instructions    Please return in next few days for fasting lab visit only. Blood pressure looks better today.   Thanks you for coming in today.    IF you received an x-ray today, you will receive an invoice from [facility]. Please contact [facility] at [phone] with questions or concerns regarding your invoice.   IF you received labwork today, you will receive an invoice from [facility]. Please contact [facility] at [phone] with questions or concerns regarding your invoice.   Our billing staff will not be able to assist you with questions regarding bills from these companies.  You will be contacted with the lab results as soon as they are available. The fastest way to get your results is to activate your My Chart account. Instructions are located on the last page of this paperwork. If you have not heard from [place] regarding the results in 2 weeks, please contact this office.       Signed,   Merri Ray, MD [facility].  05/14/18 4:20 PM

## 2018-05-14 ENCOUNTER — Encounter: Payer: Self-pay | Admitting: Family Medicine

## 2018-05-22 DIAGNOSIS — M5416 Radiculopathy, lumbar region: Secondary | ICD-10-CM | POA: Diagnosis not present

## 2018-06-07 ENCOUNTER — Other Ambulatory Visit: Payer: Self-pay | Admitting: Family Medicine

## 2018-06-07 DIAGNOSIS — E785 Hyperlipidemia, unspecified: Secondary | ICD-10-CM

## 2018-06-09 ENCOUNTER — Telehealth: Payer: Self-pay | Admitting: Family Medicine

## 2018-06-09 NOTE — Telephone Encounter (Signed)
Copied from [place] [id number]. Topic: Quick Communication - Rx Refill/Question >> Jun 09, 2018  9:44 AM Reyne Dumas L wrote: Medication: atorvastatin (LIPITOR) 20 MG tablet  Has the patient contacted their pharmacy? Yes - pharmacy states this medication is not on file for her (Agent: If no, request that the patient contact the pharmacy for the refill.) (Agent: If yes, when and what did the pharmacy advise?)  Preferred Pharmacy (with phone number or street name): [id number] [address], [place] [phone] (Phone) [phone] (Fax)  Agent: Please be advised that RX refills may take up to 3 business days. We ask that you follow-up with your pharmacy.

## 2018-06-10 ENCOUNTER — Other Ambulatory Visit: Payer: Self-pay

## 2018-06-10 DIAGNOSIS — E785 Hyperlipidemia, unspecified: Secondary | ICD-10-CM

## 2018-06-10 MED ORDER — ATORVASTATIN CALCIUM 20 MG PO TABS: 20.0000 mg | ORAL_TABLET | Freq: Every day | ORAL | 5 refills | Status: DC

## 2018-06-10 NOTE — Telephone Encounter (Signed)
Rx was resent and verified by pharmacy.

## 2018-06-17 DIAGNOSIS — M5416 Radiculopathy, lumbar region: Secondary | ICD-10-CM | POA: Diagnosis not present

## 2018-07-21 DIAGNOSIS — M47816 Spondylosis without myelopathy or radiculopathy, lumbar region: Secondary | ICD-10-CM | POA: Diagnosis not present

## 2018-08-13 DIAGNOSIS — Z23 Encounter for immunization: Secondary | ICD-10-CM | POA: Diagnosis not present

## 2018-08-19 DIAGNOSIS — M47816 Spondylosis without myelopathy or radiculopathy, lumbar region: Secondary | ICD-10-CM | POA: Diagnosis not present

## 2018-08-28 ENCOUNTER — Encounter: Payer: Self-pay | Admitting: Family Medicine

## 2018-08-28 ENCOUNTER — Ambulatory Visit (INDEPENDENT_AMBULATORY_CARE_PROVIDER_SITE_OTHER): Payer: Medicare Other | Admitting: Family Medicine

## 2018-08-28 ENCOUNTER — Other Ambulatory Visit: Payer: Self-pay

## 2018-08-28 VITALS — BP 158/88 | HR 93 | Temp 98.6°F | Ht 64.0 in | Wt 180.6 lb

## 2018-08-28 DIAGNOSIS — R0789 Other chest pain: Secondary | ICD-10-CM

## 2018-08-28 DIAGNOSIS — R062 Wheezing: Secondary | ICD-10-CM | POA: Diagnosis not present

## 2018-08-28 DIAGNOSIS — J45901 Unspecified asthma with (acute) exacerbation: Secondary | ICD-10-CM | POA: Diagnosis not present

## 2018-08-28 DIAGNOSIS — R059 Cough, unspecified: Secondary | ICD-10-CM

## 2018-08-28 DIAGNOSIS — R05 Cough: Secondary | ICD-10-CM

## 2018-08-28 MED ORDER — ALBUTEROL SULFATE (2.5 MG/3ML) 0.083% IN NEBU
2.5000 mg | INHALATION_SOLUTION | Freq: Once | RESPIRATORY_TRACT | Status: AC
  Administered 2018-08-28: 2.5 mg via RESPIRATORY_TRACT

## 2018-08-28 MED ORDER — PREDNISONE 20 MG PO TABS: 40.0000 mg | ORAL_TABLET | Freq: Every day | ORAL | 0 refills | Status: DC

## 2018-08-28 MED ORDER — IPRATROPIUM BROMIDE 0.02 % IN SOLN
0.5000 mg | Freq: Once | RESPIRATORY_TRACT | Status: AC
  Administered 2018-08-28: 0.5 mg via RESPIRATORY_TRACT

## 2018-08-28 MED ORDER — AZITHROMYCIN 250 MG PO TABS: ORAL_TABLET | ORAL | 0 refills | Status: DC

## 2018-08-28 NOTE — Patient Instructions (Addendum)
Although I hear a small amount of congestion at the bases of her lungs, the wheezes significantly improved with a breathing treatment in office.  Okay to use her albuterol at home every 4-6 hours as needed for wheezing or cough, start prednisone as that should help current asthma flare, antibiotic was also prescribed for infection.  Follow-up in the next 2 to 3 days if not improving, or sooner if worse.  Soreness of the right chest wall likely related to coughing.  I am glad to hear that is better.  Tylenol over-the-counter if needed, see other information below.  If any worsening symptoms please return for recheck or emergency room.  Return to the clinic or go to the nearest emergency room if any of your symptoms worsen or new symptoms occur.   Chest Wall Pain Chest wall pain is pain in or around the bones and muscles of your chest. Sometimes, an injury causes this pain. Sometimes, the cause may not be known. This pain may take several weeks or longer to get better. Follow these instructions at home: Pay attention to any changes in your symptoms. Take these actions to help with your pain:  Rest as told by your health care provider.  Avoid activities that cause pain. These include any activities that use your chest muscles or your abdominal and side muscles to lift heavy items.  If directed, apply ice to the painful area: ? Put ice in a plastic bag. ? Place a towel between your skin and the bag. ? Leave the ice on for 20 minutes, 2-3 times per day.  Take over-the-counter and prescription medicines only as told by your health care provider.  Do not use tobacco products, including cigarettes, chewing tobacco, and e-cigarettes. If you need help quitting, ask your health care provider.  Keep all follow-up visits as told by your health care provider. This is important.  Contact a health care provider if:  You have a fever.  Your chest pain becomes worse.  You have new symptoms. Get help  right away if:  You have nausea or vomiting.  You feel sweaty or light-headed.  You have a cough with phlegm (sputum) or you cough up blood.  You develop shortness of breath. This information is not intended to replace advice given to you by your health care provider. Make sure you discuss any questions you have with your health care provider. Document Released: 10/01/2005 Document Revised: 02/09/2016 Document Reviewed: 12/27/2014 Elsevier Interactive Patient Education  2018 [employer].   Asthma, Adult Asthma is a recurring condition in which the airways tighten and narrow. Asthma can make it difficult to breathe. It can cause coughing, wheezing, and shortness of breath. Asthma episodes, also called asthma attacks, range from minor to life-threatening. Asthma cannot be cured, but medicines and lifestyle changes can help control it. What are the causes? Asthma is believed to be caused by inherited (genetic) and environmental factors, but its exact cause is unknown. Asthma may be triggered by allergens, lung infections, or irritants in the air. Asthma triggers are different for each person. Common triggers include:  Animal dander.  Dust mites.  Cockroaches.  Pollen from trees or grass.  Mold.  Smoke.  Air pollutants such as dust, household cleaners, hair sprays, aerosol sprays, paint fumes, strong chemicals, or strong odors.  Cold air, weather changes, and winds (which increase molds and pollens in the air).  Strong emotional expressions such as crying or laughing hard.  Stress.  Certain medicines (such as aspirin)  or types of drugs (such as beta-blockers).  Sulfites in foods and drinks. Foods and drinks that may contain sulfites include dried fruit, potato chips, and sparkling grape juice.  Infections or inflammatory conditions such as the flu, a cold, or an inflammation of the nasal membranes (rhinitis).  Gastroesophageal reflux disease (GERD).  Exercise or strenuous  activity.  What are the signs or symptoms? Symptoms may occur immediately after asthma is triggered or many hours later. Symptoms include:  Wheezing.  Excessive nighttime or early morning coughing.  Frequent or severe coughing with a common cold.  Chest tightness.  Shortness of breath.  How is this diagnosed? The diagnosis of asthma is made by a review of your medical history and a physical exam. Tests may also be performed. These may include:  Lung function studies. These tests show how much air you breathe in and out.  Allergy tests.  Imaging tests such as X-rays.  How is this treated? Asthma cannot be cured, but it can usually be controlled. Treatment involves identifying and avoiding your asthma triggers. It also involves medicines. There are 2 classes of medicine used for asthma treatment:  Controller medicines. These prevent asthma symptoms from occurring. They are usually taken every day.  Reliever or rescue medicines. These quickly relieve asthma symptoms. They are used as needed and provide short-term relief.  Your health care provider will help you create an asthma action plan. An asthma action plan is a written plan for managing and treating your asthma attacks. It includes a list of your asthma triggers and how they may be avoided. It also includes information on when medicines should be taken and when their dosage should be changed. An action plan may also involve the use of a device called a peak flow meter. A peak flow meter measures how well the lungs are working. It helps you monitor your condition. Follow these instructions at home:  Take medicines only as directed by your health care provider. Speak with your health care provider if you have questions about how or when to take the medicines.  Use a peak flow meter as directed by your health care provider. Record and keep track of readings.  Understand and use the action plan to help minimize or stop an asthma  attack without needing to seek medical care.  Control your home environment in the following ways to help prevent asthma attacks: ? Do not smoke. Avoid being exposed to secondhand smoke. ? Change your heating and air conditioning filter regularly. ? Limit your use of fireplaces and wood stoves. ? Get rid of pests (such as roaches and mice) and their droppings. ? Throw away plants if you see mold on them. ? Clean your floors and dust regularly. Use unscented cleaning products. ? Try to have someone else vacuum for you regularly. Stay out of rooms while they are being vacuumed and for a short while afterward. If you vacuum, use a dust mask from a hardware store, a double-layered or microfilter vacuum cleaner bag, or a vacuum cleaner with a HEPA filter. ? Replace carpet with wood, tile, or vinyl flooring. Carpet can trap dander and dust. ? Use allergy-proof pillows, mattress covers, and box spring covers. ? Wash bed sheets and blankets every week in hot water and dry them in a dryer. ? Use blankets that are made of polyester or cotton. ? Clean bathrooms and kitchens with bleach. If possible, have someone repaint the walls in these rooms with mold-resistant paint. Keep out of the  rooms that are being cleaned and painted. ? Wash hands frequently. Contact a health care provider if:  You have wheezing, shortness of breath, or a cough even if taking medicine to prevent attacks.  The colored mucus you cough up (sputum) is thicker than usual.  Your sputum changes from clear or white to yellow, green, gray, or bloody.  You have any problems that may be related to the medicines you are taking (such as a rash, itching, swelling, or trouble breathing).  You are using a reliever medicine more than 2-3 times per week.  Your peak flow is still at 50-79% of your personal best after following your action plan for 1 hour.  You have a fever. Get help right away if:  You seem to be getting worse and are  unresponsive to treatment during an asthma attack.  You are short of breath even at rest.  You get short of breath when doing very little physical activity.  You have difficulty eating, drinking, or talking due to asthma symptoms.  You develop chest pain.  You develop a fast heartbeat.  You have a bluish color to your lips or fingernails.  You are light-headed, dizzy, or faint.  Your peak flow is less than 50% of your personal best. This information is not intended to replace advice given to you by your health care provider. Make sure you discuss any questions you have with your health care provider. Document Released: 10/01/2005 Document Revised: 03/14/2016 Document Reviewed: 04/30/2013 Elsevier Interactive Patient Education  [employer].    If you have lab work done today you will be contacted with your lab results within the next 2 weeks.  If you have not heard from [place] then please contact us. The fastest way to get your results is to register for My Chart.   IF you received an x-ray today, you will receive an invoice from [facility]. Please contact [facility] at [phone] with questions or concerns regarding your invoice.   IF you received labwork today, you will receive an invoice from [place]. Please contact [facility] at [phone] with questions or concerns regarding your invoice.   Our billing staff will not be able to assist you with questions regarding bills from these companies.  You will be contacted with the lab results as soon as they are available. The fastest way to get your results is to activate your My Chart account. Instructions are located on the last page of this paperwork. If you have not heard from [place] regarding the results in 2 weeks, please contact this office.

## 2018-08-28 NOTE — Progress Notes (Signed)
Subjective:  By signing my name below, I, [name], attest that this documentation has been prepared under the direction and in the presence of Merri Ray, MD. Electronically Signed: Moises Zimmerman, [place]. 08/28/2018 , 6:03 PM .  Patient was seen in Room 8 .   Patient ID: [name], female    DOB: [birth year], 72 y.o.   MRN: [id number] Chief Complaint  Patient presents with  . Cough    bad since yesterday. Started tue  . sharp pain on right side    behind the right breast. she felt it when she breath this morning but now it is gone   HPI [name] is a 72 y.o. female  Patient with history of cough variant asthma reports cough that started 2 days ago around noon. She states her cough has gradually gotten worse. She has noticed some wheezing; has been using her Advair bid, but no use of her rescue albuterol. She also mentions having right sharp pain over her right chest wall this morning when taking deep breath and coughing with body aches. She notes having yellow hued productive cough. She denies fever.   Patient Active Problem List   Diagnosis Date Noted  . Essential hypertension 08/19/2017  . Hyperlipidemia 08/19/2017  . Cough variant asthma 04/04/2016  . Upper airway cough syndrome 04/04/2016  . 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 Arthritis   . GERD (gastroesophageal reflux disease)    at times  . Hyperlipidemia   . Hypertension   . Peripheral vascular disease ([place])    has spider veins  . Pneumothorax, traumatic    RIGHT; associated with multiple rib fractures after a fall   Past Surgical History:  Procedure Laterality Date  . ABDOMINAL HYSTERECTOMY  1982  . AUGMENTATION MAMMAPLASTY Bilateral    Patient has bilateral implants   . 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 BREAST SURGERY    . JOINT REPLACEMENT  2009   right knee  . JOINT REPLACEMENT  2011   left knee  . MASTECTOMY  2008   right  breast  . REDUCTION MAMMAPLASTY Left    Patient had lift with implant on Left side   . SHOULDER ARTHROSCOPY DISTAL CLAVICLE EXCISION AND OPEN ROTATOR CUFF REPAIR  2011  . TOTAL SHOULDER ARTHROPLASTY  03/14/2012   Procedure: TOTAL SHOULDER ARTHROPLASTY;  Surgeon: Alta Corning, MD;  Location: [facility];  Service: Orthopedics;  Laterality: Left;   Allergies  Allergen Reactions  . Dilaudid [Hydromorphone Hcl] Itching  . Morphine And Related Nausea And Vomiting   Prior to Admission medications   Medication Sig Start Date End Date Taking? Authorizing Provider  acetaminophen (TYLENOL) 325 MG tablet Take 650 mg by mouth every 6 (six) hours as needed.    [provider]  albuterol (PROAIR HFA) 108 (90 Base) MCG/ACT inhaler Inhale 2 puffs into the lungs every 6 (six) hours as needed for wheezing or shortness of breath. 04/14/18   [name], MD  atorvastatin (LIPITOR) 20 MG tablet Take 1 tablet (20 mg total) by mouth daily. 06/10/18   [name], MD  Dextromethorphan-Guaifenesin 20-400 MG TABS Take by mouth daily.    [provider]  diphenhydrAMINE (SOMINEX) 25 MG tablet Take 50 mg by mouth at bedtime as needed for sleep.    [provider]  DM-Doxylamine-Acetaminophen (VICKS NYQUIL MULTI-SYMPTOM PO) Take 1 tablet by mouth daily.  [provider]  famotidine (PEPCID) 20 MG tablet Take 1 tablet (20 mg total) by mouth 2 (two) times daily. 09/09/17   Charlesetta Shanks, MD  fenofibrate (TRICOR) 145 MG tablet Take 1 tablet (145 mg total) by mouth daily. 04/14/18   [name], MD  fluticasone-salmeterol (ADVAIR HFA) [phone] MCG/ACT inhaler Inhale 2 puffs into the lungs 2 (two) times daily. 04/14/18   [name], MD  gabapentin (NEURONTIN) 300 MG capsule TK 1 C PO TID 03/07/18   [provider]  GUAIFENESIN PO Take 400 mg by mouth 2 (two) times daily.    [provider]  ibuprofen (ADVIL,MOTRIN) 100 MG tablet Take 100 mg by mouth every 6  (six) hours as needed.    [provider]  meclizine (ANTIVERT) 25 MG tablet Take 1 tablet (25 mg total) by mouth 3 (three) times daily as needed for dizziness. 04/14/18   [name], MD  methocarbamol (ROBAXIN) 750 MG tablet TK 1 T PO Q 8 H PRF SPASM 04/02/18   [provider]  Multiple Vitamin (MULTIVITAMIN) capsule Take 1 capsule by mouth daily.    [provider]  olmesartan-hydrochlorothiazide (BENICAR HCT) 20-12.5 MG tablet Take 1 tablet by mouth daily. 04/14/18   [name], MD  oxymetazoline (AFRIN) 0.05 % nasal spray Place 1 spray into both nostrils Nightly.    [provider]   Social History   Socioeconomic History  . Marital status: Divorced    Spouse name: Not on file  . Number of children: 2  . Years of education: Not on file  . Highest education level: Not on file  Occupational History  . Occupation: Retired  . Occupation: Glass blower/designer    Comment: Arts development officer  Social Needs  . Financial resource strain: Not on file  . Food insecurity:    Worry: Not on file    Inability: Not on file  . Transportation needs:    Medical: Not on file    Non-medical: Not on file  Tobacco Use  . Smoking status: Never Smoker  . Smokeless tobacco: Never Used  Substance and Sexual Activity  . Alcohol use: No    Alcohol/week: 0.0 standard drinks  . Drug use: No  . Sexual activity: Not on file  Lifestyle  . Physical activity:    Days per week: Not on file    Minutes per session: Not on file  . Stress: Not on file  Relationships  . Social connections:    Talks on phone: Not on file    Gets together: Not on file    Attends religious service: Not on file    Active member of club or organization: Not on file    Attends meetings of clubs or organizations: Not on file    Relationship status: Not on file  . Intimate partner violence:    Fear of current or ex partner: Not on file    Emotionally abused: Not on file    Physically  abused: Not on file    Forced sexual activity: Not on file  Other Topics Concern  . Not on file  Social History Narrative  . Not on file   Review of Systems  Constitutional: Negative for chills, fatigue, fever and unexpected weight change.  Respiratory: Positive for cough.   Gastrointestinal: Negative for constipation, diarrhea, nausea and vomiting.  Musculoskeletal:       Chest wall soreness, located behind right breast  Skin: Negative for rash and wound.  Neurological: Negative for dizziness, weakness and headaches.       Objective:   Physical Exam  Constitutional: She is oriented to person, place, and time. She appears well-developed and well-nourished. No distress.  HENT:  Head: Normocephalic and atraumatic.  Eyes: Pupils are equal, round, and reactive to light. EOM are normal.  Neck: Neck supple.  Cardiovascular: Normal rate.  Pulmonary/Chest: Effort normal. No respiratory distress. She has wheezes (diffuse expiratory wheeze throughout).  Diffuse expiratory wheeze throughout Chest wall: some reproducible pain along right chest wall, no palpable tenderness along abdomen  Musculoskeletal: Normal range of motion.  Neurological: She is alert and oriented to person, place, and time.  Skin: Skin is warm and dry.  Psychiatric: She has a normal mood and affect. Her behavior is normal.  Nursing note and vitals reviewed.   Vitals:   08/28/18 1742 08/28/18 1744  BP: (!) 178/100 (!) 158/88  Pulse: 93   Temp: 98.6 F (37 C)   TempSrc: Oral   SpO2: 98%   Weight: 180 lb 9.6 oz (81.9 kg)   Height: 5\' 4"  (1.626 m)    [6:33 PM] Repeat exam after albuterol neb: overall clear, very faint end expiratory wheeze at the base; symptomatically improved.      Assessment & Plan:   [name] is a 72 y.o. female Wheezing - Plan: albuterol (PROVENTIL) (2.5 MG/3ML) 0.083% nebulizer solution 2.5 mg, ipratropium (ATROVENT) nebulizer solution 0.5 mg, azithromycin (ZITHROMAX) 250 MG  tablet  Cough - Plan: albuterol (PROVENTIL) (2.5 MG/3ML) 0.083% nebulizer solution 2.5 mg, ipratropium (ATROVENT) nebulizer solution 0.5 mg, predniSONE (DELTASONE) 20 MG tablet, azithromycin (ZITHROMAX) 250 MG tablet  Asthmatic bronchitis with acute exacerbation, unspecified asthma severity, unspecified whether persistent - Plan: predniSONE (DELTASONE) 20 MG tablet, azithromycin (ZITHROMAX) 250 MG tablet  Chest wall pain  Suspected asthmatic bronchitis but with discolored phlegm and coughing fits could have atypical infection.   - neb in office with significant improvement.   - start prednisone, azithromycin. Potential risks and side effects discussed.   - chest wall pain reproducible, likely from cough. Improving. Symptomatic care and RTC precautions.   Meds ordered this encounter  Medications  . albuterol (PROVENTIL) (2.5 MG/3ML) 0.083% nebulizer solution 2.5 mg  . ipratropium (ATROVENT) nebulizer solution 0.5 mg  . predniSONE (DELTASONE) 20 MG tablet    Sig: Take 2 tablets (40 mg total) by mouth daily with breakfast.    Dispense:  10 tablet    Refill:  0  . azithromycin (ZITHROMAX) 250 MG tablet    Sig: Take 2 pills by mouth on day 1, then 1 pill by mouth per day on days 2 through 5.    Dispense:  6 tablet    Refill:  0   Patient Instructions   Although I hear a small amount of congestion at the bases of her lungs, the wheezes significantly improved with a breathing treatment in office.  Okay to use her albuterol at home every 4-6 hours as needed for wheezing or cough, start prednisone as that should help current asthma flare, antibiotic was also prescribed for infection.  Follow-up in the next 2 to 3 days if not improving, or sooner if worse.  Soreness of the right chest wall likely related to coughing.  I am glad to hear that is better.  Tylenol over-the-counter if needed, see other information below.  If any worsening symptoms please return for recheck or emergency room.  Return  to the clinic or go to the nearest emergency room  if any of your symptoms worsen or new symptoms occur.   Chest Wall Pain Chest wall pain is pain in or around the bones and muscles of your chest. Sometimes, an injury causes this pain. Sometimes, the cause may not be known. This pain may take several weeks or longer to get better. Follow these instructions at home: Pay attention to any changes in your symptoms. Take these actions to help with your pain:  Rest as told by your health care provider.  Avoid activities that cause pain. These include any activities that use your chest muscles or your abdominal and side muscles to lift heavy items.  If directed, apply ice to the painful area: ? Put ice in a plastic bag. ? Place a towel between your skin and the bag. ? Leave the ice on for 20 minutes, 2-3 times per day.  Take over-the-counter and prescription medicines only as told by your health care provider.  Do not use tobacco products, including cigarettes, chewing tobacco, and e-cigarettes. If you need help quitting, ask your health care provider.  Keep all follow-up visits as told by your health care provider. This is important.  Contact a health care provider if:  You have a fever.  Your chest pain becomes worse.  You have new symptoms. Get help right away if:  You have nausea or vomiting.  You feel sweaty or light-headed.  You have a cough with phlegm (sputum) or you cough up Zimmerman.  You develop shortness of breath. This information is not intended to replace advice given to you by your health care provider. Make sure you discuss any questions you have with your health care provider. Document Released: 10/01/2005 Document Revised: 02/09/2016 Document Reviewed: 12/27/2014 Elsevier Interactive Patient Education  2018 [employer].   Asthma, Adult Asthma is a recurring condition in which the airways tighten and narrow. Asthma can make it difficult to breathe. It can cause  coughing, wheezing, and shortness of breath. Asthma episodes, also called asthma attacks, range from minor to life-threatening. Asthma cannot be cured, but medicines and lifestyle changes can help control it. What are the causes? Asthma is believed to be caused by inherited (genetic) and environmental factors, but its exact cause is unknown. Asthma may be triggered by allergens, lung infections, or irritants in the air. Asthma triggers are different for each person. Common triggers include:  Animal dander.  Dust mites.  Cockroaches.  Pollen from trees or grass.  Mold.  Smoke.  Air pollutants such as dust, household cleaners, hair sprays, aerosol sprays, paint fumes, strong chemicals, or strong odors.  Cold air, weather changes, and winds (which increase molds and pollens in the air).  Strong emotional expressions such as crying or laughing hard.  Stress.  Certain medicines (such as aspirin) or types of drugs (such as beta-blockers).  Sulfites in foods and drinks. Foods and drinks that may contain sulfites include dried fruit, potato chips, and sparkling grape juice.  Infections or inflammatory conditions such as the flu, a cold, or an inflammation of the nasal membranes (rhinitis).  Gastroesophageal reflux disease (GERD).  Exercise or strenuous activity.  What are the signs or symptoms? Symptoms may occur immediately after asthma is triggered or many hours later. Symptoms include:  Wheezing.  Excessive nighttime or early morning coughing.  Frequent or severe coughing with a common cold.  Chest tightness.  Shortness of breath.  How is this diagnosed? The diagnosis of asthma is made by a review of your medical history and a physical  exam. Tests may also be performed. These may include:  Lung function studies. These tests show how much air you breathe in and out.  Allergy tests.  Imaging tests such as X-rays.  How is this treated? Asthma cannot be cured, but it  can usually be controlled. Treatment involves identifying and avoiding your asthma triggers. It also involves medicines. There are 2 classes of medicine used for asthma treatment:  Controller medicines. These prevent asthma symptoms from occurring. They are usually taken every day.  Reliever or rescue medicines. These quickly relieve asthma symptoms. They are used as needed and provide short-term relief.  Your health care provider will help you create an asthma action plan. An asthma action plan is a written plan for managing and treating your asthma attacks. It includes a list of your asthma triggers and how they may be avoided. It also includes information on when medicines should be taken and when their dosage should be changed. An action plan may also involve the use of a device called a peak flow meter. A peak flow meter measures how well the lungs are working. It helps you monitor your condition. Follow these instructions at home:  Take medicines only as directed by your health care provider. Speak with your health care provider if you have questions about how or when to take the medicines.  Use a peak flow meter as directed by your health care provider. Record and keep track of readings.  Understand and use the action plan to help minimize or stop an asthma attack without needing to seek medical care.  Control your home environment in the following ways to help prevent asthma attacks: ? Do not smoke. Avoid being exposed to secondhand smoke. ? Change your heating and air conditioning filter regularly. ? Limit your use of fireplaces and wood stoves. ? Get rid of pests (such as roaches and mice) and their droppings. ? Throw away plants if you see mold on them. ? Clean your floors and dust regularly. Use unscented cleaning products. ? Try to have someone else vacuum for you regularly. Stay out of rooms while they are being vacuumed and for a short while afterward. If you vacuum, use a dust  mask from a hardware store, a double-layered or microfilter vacuum cleaner bag, or a vacuum cleaner with a HEPA filter. ? Replace carpet with wood, tile, or vinyl flooring. Carpet can trap dander and dust. ? Use allergy-proof pillows, mattress covers, and box spring covers. ? Wash bed sheets and blankets every week in hot water and dry them in a dryer. ? Use blankets that are made of polyester or cotton. ? Clean bathrooms and kitchens with bleach. If possible, have someone repaint the walls in these rooms with mold-resistant paint. Keep out of the rooms that are being cleaned and painted. ? Wash hands frequently. Contact a health care provider if:  You have wheezing, shortness of breath, or a cough even if taking medicine to prevent attacks.  The colored mucus you cough up (sputum) is thicker than usual.  Your sputum changes from clear or white to yellow, green, gray, or bloody.  You have any problems that may be related to the medicines you are taking (such as a rash, itching, swelling, or trouble breathing).  You are using a reliever medicine more than 2-3 times per week.  Your peak flow is still at 50-79% of your personal best after following your action plan for 1 hour.  You have a fever. Get help right  away if:  You seem to be getting worse and are unresponsive to treatment during an asthma attack.  You are short of breath even at rest.  You get short of breath when doing very little physical activity.  You have difficulty eating, drinking, or talking due to asthma symptoms.  You develop chest pain.  You develop a fast heartbeat.  You have a bluish color to your lips or fingernails.  You are light-headed, dizzy, or faint.  Your peak flow is less than 50% of your personal best. This information is not intended to replace advice given to you by your health care provider. Make sure you discuss any questions you have with your health care provider. Document Released:  10/01/2005 Document Revised: 03/14/2016 Document Reviewed: 04/30/2013 Elsevier Interactive Patient Education  [employer].    If you have lab work done today you will be contacted with your lab results within the next 2 weeks.  If you have not heard from [place] then please contact us. The fastest way to get your results is to register for My Chart.   IF you received an x-ray today, you will receive an invoice from [facility]. Please contact [facility] at [phone] with questions or concerns regarding your invoice.   IF you received labwork today, you will receive an invoice from [facility]. Please contact [facility] at [phone] with questions or concerns regarding your invoice.   Our billing staff will not be able to assist you with questions regarding bills from these companies.  You will be contacted with the lab results as soon as they are available. The fastest way to get your results is to activate your My Chart account. Instructions are located on the last page of this paperwork. If you have not heard from [place] regarding the results in 2 weeks, please contact this office.       I personally performed the services described in this documentation, which was scribed in my presence. The recorded information has been reviewed and considered for accuracy and completeness, addended by me as needed, and agree with information above.  Signed,   Merri Ray, MD [facility].  09/01/18 10:54 PM

## 2018-09-02 ENCOUNTER — Other Ambulatory Visit: Payer: Self-pay | Admitting: Family Medicine

## 2018-09-02 DIAGNOSIS — Z1231 Encounter for screening mammogram for malignant neoplasm of breast: Secondary | ICD-10-CM

## 2018-10-06 DIAGNOSIS — H2513 Age-related nuclear cataract, bilateral: Secondary | ICD-10-CM | POA: Diagnosis not present

## 2018-10-17 ENCOUNTER — Ambulatory Visit: Payer: Medicare Other

## 2018-11-07 ENCOUNTER — Other Ambulatory Visit: Payer: Self-pay | Admitting: Family Medicine

## 2018-11-07 DIAGNOSIS — I10 Essential (primary) hypertension: Secondary | ICD-10-CM

## 2018-11-07 DIAGNOSIS — E785 Hyperlipidemia, unspecified: Secondary | ICD-10-CM

## 2018-11-07 NOTE — Telephone Encounter (Signed)
Requested medication (s) are due for refill today: yes to both  Requested medication (s) are on the active medication list: yes to both   Last refill:  Olmesartan 04/14/18   Fenofibrate  7/1//19  Future visit scheduled: no  Notes to clinic:  Was scheduled to come in for lab visit and did not come    Requested Prescriptions  Pending Prescriptions Disp Refills   olmesartan-hydrochlorothiazide (BENICAR HCT) 20-12.5 MG tablet [Pharmacy Med Name: OLMESARTAN MEDOX/HCTZ 20-12.5MG TAB] 30 tablet 5    Sig: Take 1 tablet by mouth daily.     Cardiovascular: ARB + Diuretic Combos Failed - 11/07/2018 12:04 PM      Failed - K in normal range and within 180 days    Potassium  Date Value Ref Range Status  09/09/2017 3.6 3.5 - 5.1 mmol/L Final         Failed - Na in normal range and within 180 days    Sodium  Date Value Ref Range Status  09/09/2017 136 135 - 145 mmol/L Final  08/19/2017 141 134 - 144 mmol/L Final         Failed - Cr in normal range and within 180 days    Creat  Date Value Ref Range Status  06/13/2016 0.88 0.60 - 0.93 mg/dL Final    Comment:      For patients > or = 73 years of age: The upper reference limit for Creatinine is approximately 13% higher for people identified as African-American.      Creatinine, Ser  Date Value Ref Range Status  09/09/2017 0.83 0.44 - 1.00 mg/dL Final         Failed - Ca in normal range and within 180 days    Calcium  Date Value Ref Range Status  09/09/2017 9.9 8.9 - 10.3 mg/dL Final         Failed - Last BP in normal range    BP Readings from Last 1 Encounters:  08/28/18 (!) 158/88         Passed - Patient is not pregnant      Passed - Valid encounter within last 6 months    Recent Outpatient Visits          2 months ago Wheezing   [facility], Ranell Patrick, MD   5 months ago Essential hypertension   [facility], Ranell Patrick, MD   6 months ago Encounter for Medicare annual wellness exam   [facility], Ranell Patrick, MD   1 year ago Asthmatic bronchitis without complication, unspecified asthma severity, unspecified whether persistent   [facility], Ranell Patrick, MD   1 year ago Acute asthmatic bronchitis   [facility], [place], MD            fenofibrate (TRICOR) 145 MG tablet [Pharmacy Med Name: FENOFIBRATE 145MG TABLETS] 30 tablet 5    Sig: TAKE 1 TABLET(145 MG) BY MOUTH DAILY     Cardiovascular:  Antilipid - Fibric Acid Derivatives Failed - 11/07/2018 12:04 PM      Failed - Total Cholesterol in normal range and within 360 days    Cholesterol, Total  Date Value Ref Range Status  08/19/2017 158 100 - 199 mg/dL Final         Failed - LDL in normal range and within 360 days    LDL Calculated  Date Value Ref Range Status  08/19/2017 78 0 - 99  mg/dL Final         Failed - HDL in normal range and within 360 days    HDL  Date Value Ref Range Status  08/19/2017 66 >39 mg/dL Final         Failed - Triglycerides in normal range and within 360 days    Triglycerides  Date Value Ref Range Status  08/19/2017 72 0 - 149 mg/dL Final         Failed - ALT in normal range and within 180 days    ALT  Date Value Ref Range Status  09/09/2017 20 14 - 54 U/L Final         Failed - AST in normal range and within 180 days    AST  Date Value Ref Range Status  09/09/2017 24 15 - 41 U/L Final         Failed - Cr in normal range and within 180 days    Creat  Date Value Ref Range Status  06/13/2016 0.88 0.60 - 0.93 mg/dL Final    Comment:      For patients > or = 74 years of age: The upper reference limit for Creatinine is approximately 13% higher for people identified as African-American.      Creatinine, Ser  Date Value Ref Range Status  09/09/2017 0.83 0.44 - 1.00 mg/dL Final         Failed - eGFR in normal range and within 180 days    GFR, Est African American  Date Value Ref Range Status  06/13/2016 77  >=60 mL/min Final   GFR calc Af Amer  Date Value Ref Range Status  09/09/2017 >60 >60 mL/min Final    Comment:    (NOTE) The eGFR has been calculated using the CKD EPI equation. This calculation has not been validated in all clinical situations. eGFR's persistently <60 mL/min signify possible Chronic Kidney Disease.    GFR, Est Non African American  Date Value Ref Range Status  06/13/2016 67 >=60 mL/min Final   GFR calc non Af Amer  Date Value Ref Range Status  09/09/2017 >60 >60 mL/min Final         Passed - Valid encounter within last 12 months    Recent Outpatient Visits          2 months ago Wheezing   [facility], Ranell Patrick, MD   5 months ago Essential hypertension   [facility], Ranell Patrick, MD   6 months ago Encounter for Medicare annual wellness exam   [facility], Ranell Patrick, MD   1 year ago Asthmatic bronchitis without complication, unspecified asthma severity, unspecified whether persistent   [facility], Ranell Patrick, MD   1 year ago Acute asthmatic bronchitis   [facility], Ines Bloomer, MD

## 2018-11-07 NOTE — Telephone Encounter (Signed)
Requested medication (s) are due for refill today: yes  Requested medication (s) are on the active medication list: yes  Last refill:  04/14/18  Future visit scheduled: no  Notes to clinic: overdue labs    Requested Prescriptions  Pending Prescriptions Disp Refills   olmesartan-hydrochlorothiazide (BENICAR HCT) 20-12.5 MG tablet [Pharmacy Med Name: OLMESARTAN MEDOX/HCTZ 20-12.5MG TAB] 30 tablet 5    Sig: Take 1 tablet by mouth daily.     Cardiovascular: ARB + Diuretic Combos Failed - 11/07/2018 12:04 PM      Failed - K in normal range and within 180 days    Potassium  Date Value Ref Range Status  09/09/2017 3.6 3.5 - 5.1 mmol/L Final         Failed - Na in normal range and within 180 days    Sodium  Date Value Ref Range Status  09/09/2017 136 135 - 145 mmol/L Final  08/19/2017 141 134 - 144 mmol/L Final         Failed - Cr in normal range and within 180 days    Creat  Date Value Ref Range Status  06/13/2016 0.88 0.60 - 0.93 mg/dL Final    Comment:      For patients > or = 74 years of age: The upper reference limit for Creatinine is approximately 13% higher for people identified as African-American.      Creatinine, Ser  Date Value Ref Range Status  09/09/2017 0.83 0.44 - 1.00 mg/dL Final         Failed - Ca in normal range and within 180 days    Calcium  Date Value Ref Range Status  09/09/2017 9.9 8.9 - 10.3 mg/dL Final         Failed - Last BP in normal range    BP Readings from Last 1 Encounters:  08/28/18 (!) 158/88         Passed - Patient is not pregnant      Passed - Valid encounter within last 6 months    Recent Outpatient Visits          2 months ago Wheezing   [facility], Ranell Patrick, MD   5 months ago Essential hypertension   [facility], Ranell Patrick, MD   6 months ago Encounter for Medicare annual wellness exam   [facility], Ranell Patrick, MD   1 year ago Asthmatic bronchitis without  complication, unspecified asthma severity, unspecified whether persistent   [facility], Ranell Patrick, MD   1 year ago Acute asthmatic bronchitis   [facility], [place], MD            fenofibrate (TRICOR) 145 MG tablet [Pharmacy Med Name: FENOFIBRATE 145MG TABLETS] 30 tablet 5    Sig: TAKE 1 TABLET(145 MG) BY MOUTH DAILY     Cardiovascular:  Antilipid - Fibric Acid Derivatives Failed - 11/07/2018 12:04 PM      Failed - Total Cholesterol in normal range and within 360 days    Cholesterol, Total  Date Value Ref Range Status  08/19/2017 158 100 - 199 mg/dL Final         Failed - LDL in normal range and within 360 days    LDL Calculated  Date Value Ref Range Status  08/19/2017 78 0 - 99 mg/dL Final         Failed - HDL in normal range and within 360 days  HDL  Date Value Ref Range Status  08/19/2017 66 >39 mg/dL Final         Failed - Triglycerides in normal range and within 360 days    Triglycerides  Date Value Ref Range Status  08/19/2017 72 0 - 149 mg/dL Final         Failed - ALT in normal range and within 180 days    ALT  Date Value Ref Range Status  09/09/2017 20 14 - 54 U/L Final         Failed - AST in normal range and within 180 days    AST  Date Value Ref Range Status  09/09/2017 24 15 - 41 U/L Final         Failed - Cr in normal range and within 180 days    Creat  Date Value Ref Range Status  06/13/2016 0.88 0.60 - 0.93 mg/dL Final    Comment:      For patients > or = 74 years of age: The upper reference limit for Creatinine is approximately 13% higher for people identified as African-American.      Creatinine, Ser  Date Value Ref Range Status  09/09/2017 0.83 0.44 - 1.00 mg/dL Final         Failed - eGFR in normal range and within 180 days    GFR, Est African American  Date Value Ref Range Status  06/13/2016 77 >=60 mL/min Final   GFR calc Af Amer  Date Value Ref Range Status  09/09/2017 >60 >60  mL/min Final    Comment:    (NOTE) The eGFR has been calculated using the CKD EPI equation. This calculation has not been validated in all clinical situations. eGFR's persistently <60 mL/min signify possible Chronic Kidney Disease.    GFR, Est Non African American  Date Value Ref Range Status  06/13/2016 67 >=60 mL/min Final   GFR calc non Af Amer  Date Value Ref Range Status  09/09/2017 >60 >60 mL/min Final         Passed - Valid encounter within last 12 months    Recent Outpatient Visits          2 months ago Wheezing   [facility], Ranell Patrick, MD   5 months ago Essential hypertension   [facility], Ranell Patrick, MD   6 months ago Encounter for Medicare annual wellness exam   [facility], Ranell Patrick, MD   1 year ago Asthmatic bronchitis without complication, unspecified asthma severity, unspecified whether persistent   [facility], Ranell Patrick, MD   1 year ago Acute asthmatic bronchitis   [facility], Ines Bloomer, MD

## 2018-11-07 NOTE — Telephone Encounter (Signed)
Pt following up on refill request.  She states she did not know her meds were not refilled for a year, but she does not have the money to come in. Hopes her meds refilled until her cpe in July

## 2018-12-23 DIAGNOSIS — M545 Low back pain: Secondary | ICD-10-CM | POA: Diagnosis not present

## 2018-12-23 DIAGNOSIS — M4696 Unspecified inflammatory spondylopathy, lumbar region: Secondary | ICD-10-CM | POA: Diagnosis not present

## 2019-01-12 ENCOUNTER — Telehealth: Payer: Self-pay | Admitting: Family Medicine

## 2019-01-12 NOTE — Telephone Encounter (Signed)
Copied from [facility] [phone]. Topic: Quick Communication - Rx Refill/Question >> Jan 12, 2019 12:29 PM Selinda Flavin B, NT wrote: Medication: atorvastatin (LIPITOR) 20 MG tablet  Has the patient contacted their pharmacy? yes (Agent: If no, request that the patient contact the pharmacy for the refill.) (Agent: If yes, when and what did the pharmacy advise?)  Preferred Pharmacy (with phone number or street name): [id number] - [place], [place]: Please be advised that RX refills may take up to 3 business days. We ask that you follow-up with your pharmacy.

## 2019-01-14 NOTE — Telephone Encounter (Signed)
Needs tele med visit for refills

## 2019-01-15 ENCOUNTER — Other Ambulatory Visit: Payer: Self-pay

## 2019-01-15 DIAGNOSIS — E785 Hyperlipidemia, unspecified: Secondary | ICD-10-CM

## 2019-01-15 MED ORDER — ATORVASTATIN CALCIUM 20 MG PO TABS: 20.0000 mg | ORAL_TABLET | Freq: Every day | ORAL | 4 refills | Status: DC

## 2019-01-16 ENCOUNTER — Other Ambulatory Visit: Payer: Self-pay

## 2019-01-16 ENCOUNTER — Telehealth (INDEPENDENT_AMBULATORY_CARE_PROVIDER_SITE_OTHER): Payer: Medicare Other | Admitting: Family Medicine

## 2019-01-16 ENCOUNTER — Encounter: Payer: Self-pay | Admitting: Family Medicine

## 2019-01-16 DIAGNOSIS — E785 Hyperlipidemia, unspecified: Secondary | ICD-10-CM | POA: Diagnosis not present

## 2019-01-16 DIAGNOSIS — R739 Hyperglycemia, unspecified: Secondary | ICD-10-CM | POA: Diagnosis not present

## 2019-01-16 DIAGNOSIS — J4520 Mild intermittent asthma, uncomplicated: Secondary | ICD-10-CM

## 2019-01-16 DIAGNOSIS — I10 Essential (primary) hypertension: Secondary | ICD-10-CM | POA: Diagnosis not present

## 2019-01-16 MED ORDER — ATORVASTATIN CALCIUM 20 MG PO TABS: 20.0000 mg | ORAL_TABLET | Freq: Every day | ORAL | 4 refills | Status: DC

## 2019-01-16 NOTE — Patient Instructions (Signed)
My staff will call you to schedule a lab only visit sometime next week.  I would like them to check your blood pressure at that visit so I can have a reading to review.  No change in medications for now.  Please let me know if there are questions, but otherwise I will see you in 3 months for a follow-up visit.  Return to the clinic or go to the nearest emergency room if any of your symptoms worsen or new symptoms occur.

## 2019-01-16 NOTE — Progress Notes (Signed)
Pt c/o medication refill liptor. Says she probably needs blood work. No travel

## 2019-01-16 NOTE — Progress Notes (Signed)
Virtual Visit via Telephone Note  I connected with [name] on 01/16/19 at 11:33 AM by telephone and verified that I am speaking with the correct person using two identifiers.   I discussed the limitations, risks, security and privacy concerns of performing an evaluation and management service by telephone and the availability of in person appointments. I also discussed with the patient that there may be a patient responsible charge related to this service. The patient expressed understanding and agreed to proceed, consent obtained  Chief complaint: HLD  History of Present Illness: [name] is a 73 y.o. female  Hyperlipidemia:  Lab Results  Component Value Date   CHOL 158 08/19/2017   HDL 66 08/19/2017   LDLCALC 78 08/19/2017   TRIG 72 08/19/2017   CHOLHDL 2.4 08/19/2017   Lab Results  Component Value Date   ALT 20 09/09/2017   AST 24 09/09/2017   ALKPHOS 56 09/09/2017   BILITOT 0.8 09/09/2017  Plan for fasting lab visit after visit in July 2019, but that has not been performed.  She takes TriCor 145 mg daily, as well as atorvastatin 20 mg daily. Denies missed doses, no myalgias or new side effects.    Hypertension: BP Readings from Last 3 Encounters:  08/28/18 (!) 158/88  05/12/18 140/74  04/14/18 (!) 158/82   Lab Results  Component Value Date   CREATININE 0.83 09/09/2017  Improved readings in the past with continued dosing of medication.  She was continued on Benicar HCT 20/12.5 mg daily.  Plan for lab work as above, but not has not had checked since 2018.  Taking med daily.  Occasional BP measurement at pharmacy - unknown numbers.  "Running ok" No new CP, dizziness, dyspnea, HA.   Hyperglycemia: Glucose 105-129 over past 2 years.  Lab Results  Component Value Date   HGBA1C 5.6 12/31/2016    Asthma: Has used Advair HFA 115/21 mcg 2 puffs twice daily albuterol as needed.  Suspected flare of asthmatic bronchitis with discolored phlegm in  November 2019.  Treated with albuterol/Atrovent neb treatment in office with significant improvement at that time.  Treated with prednisone and azithromycin outpatient.  Doing ok, even with pollen, able to go on walks, no recent use of albuterol needed.   Avoiding public places, uses mask/gloves.     Patient Active Problem List   Diagnosis Date Noted  . Essential hypertension 08/19/2017  . Hyperlipidemia 08/19/2017  . Cough variant asthma 04/04/2016  . Upper airway cough syndrome 04/04/2016  . 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 Arthritis   . GERD (gastroesophageal reflux disease)    at times  . Hyperlipidemia   . Hypertension   . Peripheral vascular disease ([place])    has spider veins  . Pneumothorax, traumatic    RIGHT; associated with multiple rib fractures after a fall   Past Surgical History:  Procedure Laterality Date  . ABDOMINAL HYSTERECTOMY  1982  . AUGMENTATION MAMMAPLASTY Bilateral    Patient has bilateral implants   . 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 BREAST SURGERY    . JOINT REPLACEMENT  2009   right knee  . JOINT REPLACEMENT  2011   left knee  . MASTECTOMY  2008   right breast  . REDUCTION MAMMAPLASTY Left    Patient had lift with implant on Left side   . SHOULDER ARTHROSCOPY DISTAL CLAVICLE EXCISION AND OPEN ROTATOR CUFF REPAIR  2011  . TOTAL SHOULDER ARTHROPLASTY  03/14/2012   Procedure: TOTAL SHOULDER ARTHROPLASTY;  Surgeon: Alta Corning, MD;  Location: [facility];  Service: Orthopedics;  Laterality: Left;   Allergies  Allergen Reactions  . Dilaudid [Hydromorphone Hcl] Itching  . Morphine And Related Nausea And Vomiting   Prior to Admission medications   Medication Sig Start Date End Date Taking? Authorizing Provider  albuterol (PROAIR HFA) 108 (90 Base) MCG/ACT inhaler Inhale 2 puffs into the lungs every 6 (six) hours as needed for wheezing or shortness of breath. 04/14/18  Yes [name],  MD  atorvastatin (LIPITOR) 20 MG tablet Take 1 tablet (20 mg total) by mouth daily. 01/15/19  Yes [name], MD  diphenhydrAMINE (SOMINEX) 25 MG tablet Take 50 mg by mouth at bedtime as needed for sleep.   Yes [provider]  DM-Doxylamine-Acetaminophen (VICKS NYQUIL MULTI-SYMPTOM PO) Take 1 tablet by mouth daily.   Yes [provider]  famotidine (PEPCID) 20 MG tablet Take 1 tablet (20 mg total) by mouth 2 (two) times daily. 09/09/17  Yes Charlesetta Shanks, MD  fenofibrate (TRICOR) 145 MG tablet TAKE 1 TABLET(145 MG) BY MOUTH DAILY 11/07/18  Yes [name], MD  fluticasone-salmeterol (ADVAIR [facility]) 115-21 MCG/ACT inhaler Inhale 2 puffs into the lungs 2 (two) times daily. 04/14/18  Yes [name], MD  gabapentin (NEURONTIN) 300 MG capsule TK 1 C PO TID 03/07/18  Yes [provider]  GUAIFENESIN PO Take 400 mg by mouth 2 (two) times daily.   Yes [provider]  ibuprofen (ADVIL,MOTRIN) 100 MG tablet Take 100 mg by mouth every 6 (six) hours as needed.   Yes [provider]  meclizine (ANTIVERT) 25 MG tablet Take 1 tablet (25 mg total) by mouth 3 (three) times daily as needed for dizziness. 04/14/18  Yes [name], MD  methocarbamol (ROBAXIN) 750 MG tablet TK 1 T PO Q 8 H PRF SPASM 04/02/18  Yes [provider]  Multiple Vitamin (MULTIVITAMIN) capsule Take 1 capsule by mouth daily.   Yes [provider]  olmesartan-hydrochlorothiazide (BENICAR HCT) 20-12.5 MG tablet TAKE 1 TABLET BY MOUTH DAILY 11/07/18  Yes [name], MD  oxymetazoline (AFRIN) 0.05 % nasal spray Place 1 spray into both nostrils Nightly.   Yes [provider]  acetaminophen (TYLENOL) 325 MG tablet Take 650 mg by mouth every 6 (six) hours as needed.    [provider]  Dextromethorphan-Guaifenesin 20-400 MG TABS Take by mouth daily.    [provider]   Social History   Socioeconomic History  . Marital status: Divorced     Spouse name: Not on file  . Number of children: 2  . Years of education: Not on file  . Highest education level: Not on file  Occupational History  . Occupation: Retired  . Occupation: Glass blower/designer    Comment: Arts development officer  Social Needs  . Financial resource strain: Not on file  . Food insecurity:    Worry: Not on file    Inability: Not on file  . Transportation needs:    Medical: Not on file    Non-medical: Not on file  Tobacco Use  . Smoking status: Never Smoker  . Smokeless tobacco: Never Used  Substance and Sexual Activity  . Alcohol use: No    Alcohol/week: 0.0 standard drinks  . Drug use: No  . Sexual activity: Not on file  Lifestyle  . Physical activity:    Days  per week: Not on file    Minutes per session: Not on file  . Stress: Not on file  Relationships  . Social connections:    Talks on phone: Not on file    Gets together: Not on file    Attends religious service: Not on file    Active member of club or organization: Not on file    Attends meetings of clubs or organizations: Not on file    Relationship status: Not on file  . Intimate partner violence:    Fear of current or ex partner: Not on file    Emotionally abused: Not on file    Physically abused: Not on file    Forced sexual activity: Not on file  Other Topics Concern  . Not on file  Social History Narrative  . Not on file     Observations/Objective:   Assessment and Plan: Hyperglycemia - Plan: Hemoglobin A1c  -Check A1c at upcoming lab appointment.  Hyperlipidemia, unspecified hyperlipidemia type - Plan: atorvastatin (LIPITOR) 20 MG tablet, Comprehensive metabolic panel, Lipid panel  -Tolerating combination of both Lipitor and TriCor without new myalgias.  Overdue for labs, plan for lab only visit this week.  Essential hypertension - Plan: Comprehensive metabolic panel  -Reports stable control, but elevated at previous visit.  We will asked for her to have a blood  pressure check at upcoming lab only visit, monitoring home, no med changes for now.  Mild intermittent asthma without complication  -Stable, including with current pollen/seasonal allergies.  Continue same regimen, RTC precautions  Follow Up Instructions:  Lab visit, then 3 month appt.   Patient Instructions  My staff will call you to schedule a lab only visit sometime next week.  I would like them to check your blood pressure at that visit so I can have a reading to review.  No change in medications for now.  Please let me know if there are questions, but otherwise I will see you in 3 months for a follow-up visit.  Return to the clinic or go to the nearest emergency room if any of your symptoms worsen or new symptoms occur.      I discussed the assessment and treatment plan with the patient. The patient was provided an opportunity to ask questions and all were answered. The patient agreed with the plan and demonstrated an understanding of the instructions.   The patient was advised to call back or seek an in-person evaluation if the symptoms worsen or if the condition fails to improve as anticipated.  I provided 11 minutes of non-face-to-face time during this encounter.  Signed,   Merri Ray, MD [facility].  01/16/19

## 2019-01-23 ENCOUNTER — Ambulatory Visit: Payer: Medicare Other

## 2019-01-23 ENCOUNTER — Other Ambulatory Visit: Payer: Self-pay

## 2019-01-23 DIAGNOSIS — I10 Essential (primary) hypertension: Secondary | ICD-10-CM

## 2019-01-23 DIAGNOSIS — R739 Hyperglycemia, unspecified: Secondary | ICD-10-CM | POA: Diagnosis not present

## 2019-01-23 DIAGNOSIS — E785 Hyperlipidemia, unspecified: Secondary | ICD-10-CM

## 2019-01-24 LAB — COMPREHENSIVE METABOLIC PANEL
ALT: 10 IU/L (ref 0–32)
AST: 21 IU/L (ref 0–40)
Albumin/Globulin Ratio: 1.8 (ref 1.2–2.2)
Albumin: 4.6 g/dL (ref 3.7–4.7)
Alkaline Phosphatase: 66 IU/L (ref 39–117)
BUN/Creatinine Ratio: 36 — ABNORMAL HIGH (ref 12–28)
BUN: 35 mg/dL — ABNORMAL HIGH (ref 8–27)
Bilirubin Total: 0.5 mg/dL (ref 0.0–1.2)
CO2: 21 mmol/L (ref 20–29)
Calcium: 10 mg/dL (ref 8.7–10.3)
Chloride: 100 mmol/L (ref 96–106)
Creatinine, Ser: 0.97 mg/dL (ref 0.57–1.00)
GFR calc Af Amer: 67 mL/min/{1.73_m2} (ref >59)
GFR calc non Af Amer: 58 mL/min/{1.73_m2} — ABNORMAL LOW (ref >59)
Globulin, Total: 2.6 g/dL (ref 1.5–4.5)
Glucose: 125 mg/dL — ABNORMAL HIGH (ref 65–99)
Potassium: 4.1 mmol/L (ref 3.5–5.2)
Sodium: 139 mmol/L (ref 134–144)
Total Protein: 7.2 g/dL (ref 6.0–8.5)

## 2019-01-24 LAB — LIPID PANEL
Chol/HDL Ratio: 2.9 ratio (ref 0.0–4.4)
Cholesterol, Total: 175 mg/dL (ref 100–199)
HDL: 61 mg/dL (ref >39)
LDL Calculated: 89 mg/dL (ref 0–99)
Triglycerides: 123 mg/dL (ref 0–149)
VLDL Cholesterol Cal: 25 mg/dL (ref 5–40)

## 2019-01-24 LAB — HEMOGLOBIN A1C
Est. average glucose Bld gHb Est-mCnc: 120 mg/dL
Hgb A1c MFr Bld: 5.8 % — ABNORMAL HIGH (ref 4.8–5.6)

## 2019-02-04 DIAGNOSIS — M47816 Spondylosis without myelopathy or radiculopathy, lumbar region: Secondary | ICD-10-CM | POA: Diagnosis not present

## 2019-03-17 IMAGING — DX DG CHEST 2V
2 series · 2 of 2 positions shown · non-contrast
Comparison: 04/09/2017 chest radiograph.

CLINICAL DATA: Productive cough and wheezing.  COPD.

EXAM:
CHEST  2 VIEW

[chest pa]
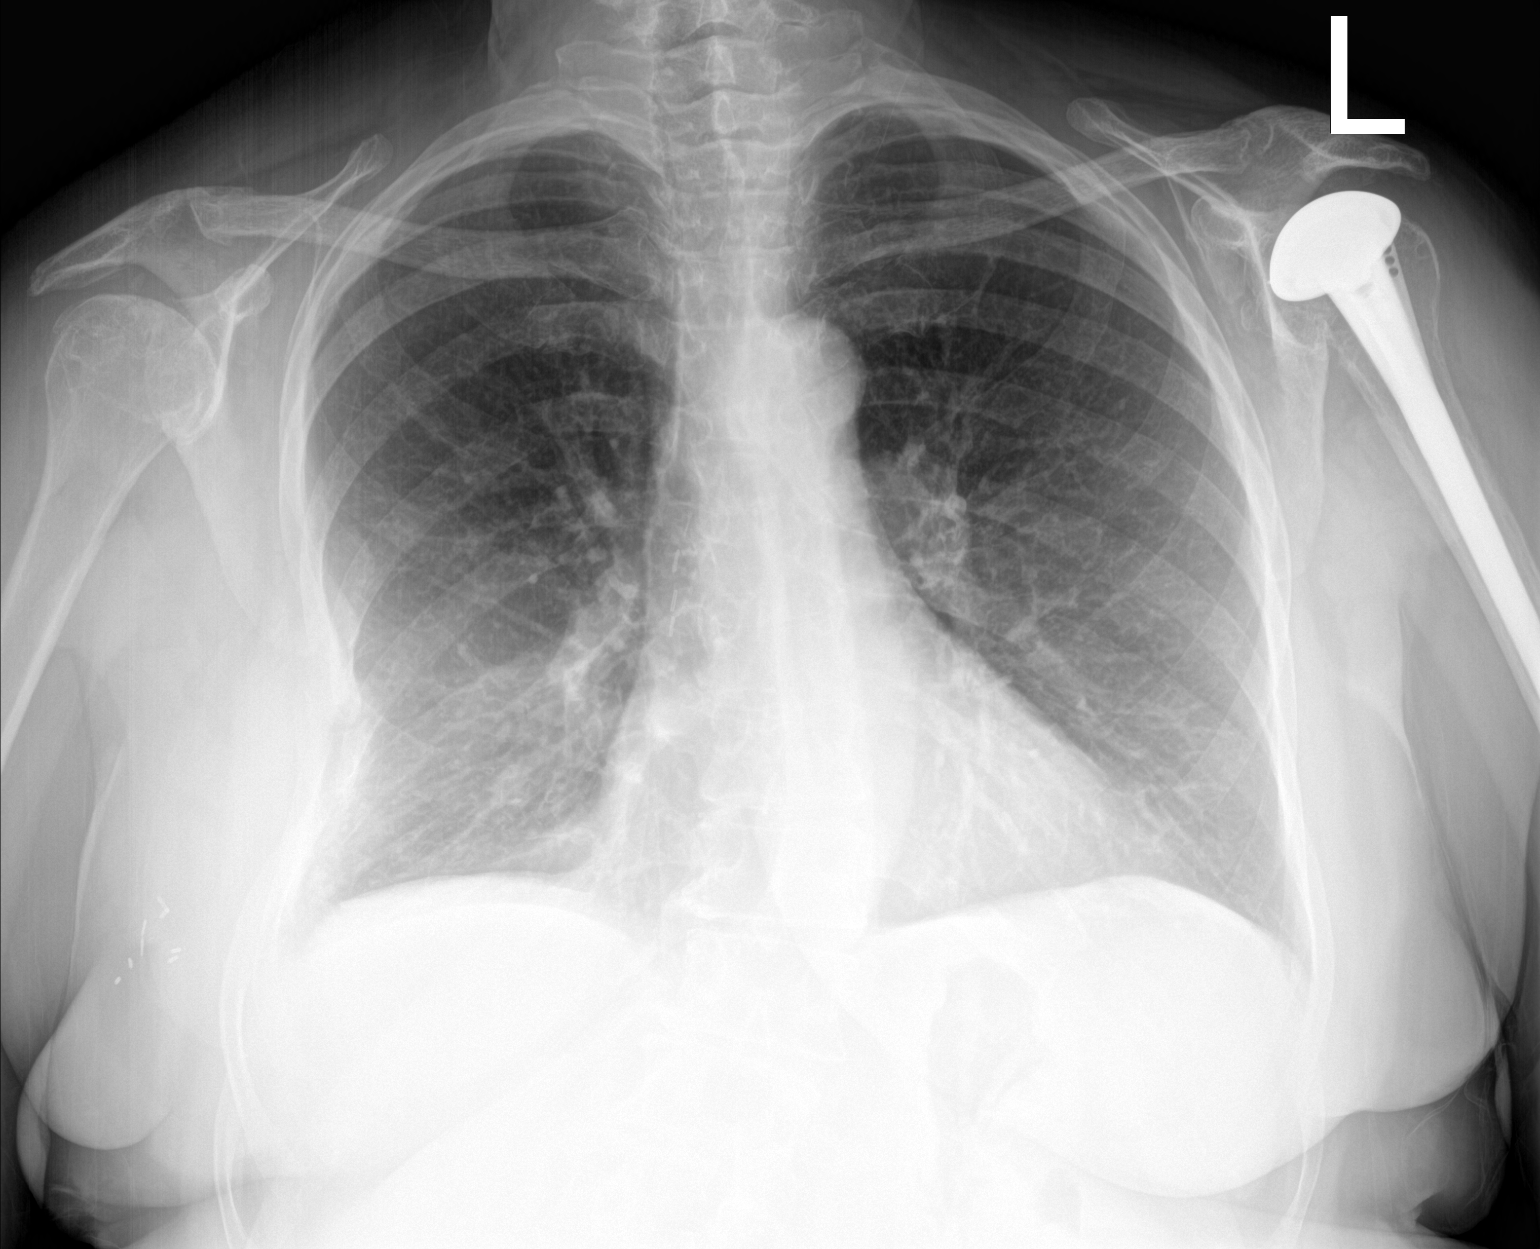

[chest lat]
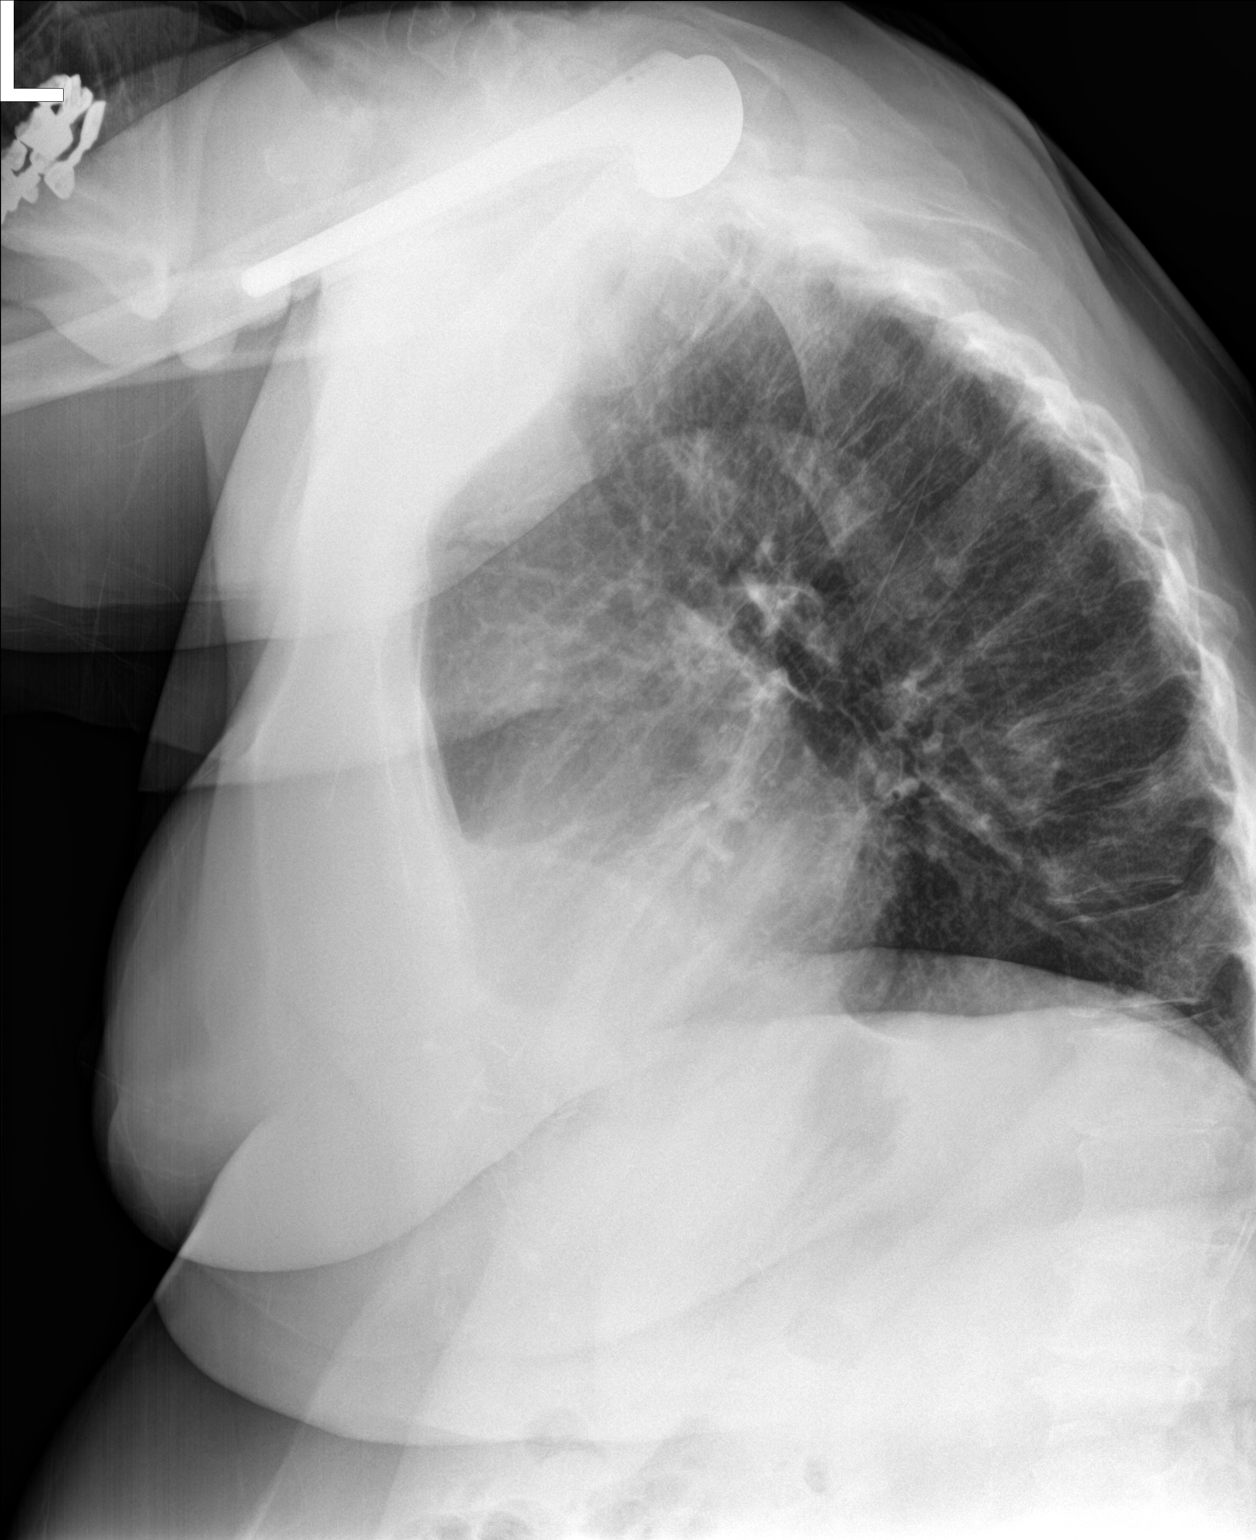

[2 of 2 positions shown; findings below may reference images not displayed]

FINDINGS: Partially visualized left shoulder arthroplasty. Surgical clips
overlie the lateral right breast. Stable cardiomediastinal
silhouette with normal heart size. No pneumothorax. No pleural
effusion. No pulmonary edema. No acute consolidative airspace
disease. Stable chronic deformities in the lateral right mid to
lower ribs with associated mild pleural-parenchymal scarring.
IMPRESSION: No active cardiopulmonary disease.

## 2019-04-10 ENCOUNTER — Other Ambulatory Visit: Payer: Self-pay

## 2019-04-10 ENCOUNTER — Encounter (HOSPITAL_BASED_OUTPATIENT_CLINIC_OR_DEPARTMENT_OTHER): Payer: Self-pay | Admitting: Emergency Medicine

## 2019-04-10 ENCOUNTER — Emergency Department (HOSPITAL_BASED_OUTPATIENT_CLINIC_OR_DEPARTMENT_OTHER): Payer: Medicare Other

## 2019-04-10 ENCOUNTER — Emergency Department (HOSPITAL_BASED_OUTPATIENT_CLINIC_OR_DEPARTMENT_OTHER)
Admission: EM | Admit: 2019-04-10 | Discharge: 2019-04-10 | Disposition: A | Discharge status: Home / Self Care | Payer: Medicare Other | Attending: Emergency Medicine | Admitting: Emergency Medicine

## 2019-04-10 DIAGNOSIS — S99922A Unspecified injury of left foot, initial encounter: Secondary | ICD-10-CM | POA: Diagnosis present

## 2019-04-10 DIAGNOSIS — Y33XXXA Other specified events, undetermined intent, initial encounter: Secondary | ICD-10-CM | POA: Insufficient documentation

## 2019-04-10 DIAGNOSIS — I10 Essential (primary) hypertension: Secondary | ICD-10-CM | POA: Diagnosis not present

## 2019-04-10 DIAGNOSIS — Y999 Unspecified external cause status: Secondary | ICD-10-CM | POA: Diagnosis not present

## 2019-04-10 DIAGNOSIS — Z96653 Presence of artificial knee joint, bilateral: Secondary | ICD-10-CM | POA: Diagnosis not present

## 2019-04-10 DIAGNOSIS — S92322A Displaced fracture of second metatarsal bone, left foot, initial encounter for closed fracture: Secondary | ICD-10-CM | POA: Diagnosis not present

## 2019-04-10 DIAGNOSIS — Z79899 Other long term (current) drug therapy: Secondary | ICD-10-CM | POA: Diagnosis not present

## 2019-04-10 DIAGNOSIS — Y939 Activity, unspecified: Secondary | ICD-10-CM | POA: Diagnosis not present

## 2019-04-10 DIAGNOSIS — Y929 Unspecified place or not applicable: Secondary | ICD-10-CM | POA: Insufficient documentation

## 2019-04-10 NOTE — ED Notes (Signed)
C/o pain to top and bottom of left foot sudden onset while walking  Denies known inj some redness and swelling noted,  Ice applied

## 2019-04-10 NOTE — ED Notes (Signed)
Ice pack placed on injured foot

## 2019-04-10 NOTE — ED Notes (Signed)
Patient transported to X-ray 

## 2019-04-10 NOTE — ED Provider Notes (Signed)
[facility] EMERGENCY DEPARTMENT Provider Note   CSN: [id number] Arrival date & time: 04/10/19  1303    History   Chief Complaint Chief Complaint  Patient presents with  . Foot Pain    HPI [name] is a 73 y.o. female with past medical history of peroneal tendinitis, hypertension, hyperlipidemia, osteoarthritis, presenting to the emergency department with complaint of sudden onset of gradually worsening pain to her left foot that began on Wednesday.  Patient states she had been at [facility] and when she was walking out she noticed the pain to her foot.  The pain is worse with palpation and walking.  She is taken ibuprofen, elevated, and applied ice with some relief.  She had a cam walker boot at home and wore this which also provided some relief.  She states she has a history of peroneal tendinitis in this foot, however the pain was more localized to her heel.  The pain is localized to the distal foot at the base of the toes and under the ball of her foot.  She notes associated swelling.  No known injury.  No history or family history of gout.  No history of diabetes.  Patient does have an orthopedic specialist.     The history is provided by the patient.    Past Medical History:  Diagnosis Date  . Arthritis   . GERD (gastroesophageal reflux disease)    at times  . Hyperlipidemia   . Hypertension   . Peripheral vascular disease ([place])    has spider veins  . Pneumothorax, traumatic    RIGHT; associated with multiple rib fractures after a fall    Patient Active Problem List   Diagnosis Date Noted  . Essential hypertension 08/19/2017  . Hyperlipidemia 08/19/2017  . Cough variant asthma 04/04/2016  . Upper airway cough syndrome 04/04/2016  . 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 ABDOMINAL HYSTERECTOMY  1982  . AUGMENTATION MAMMAPLASTY Bilateral    Patient has bilateral implants   . AXILLARY  SURGERY  2007   due to MRSA  . BREAST SURGERY    . JOINT REPLACEMENT  2009   right knee  . JOINT REPLACEMENT  2011   left knee  . MASTECTOMY  2008   right breast  . REDUCTION MAMMAPLASTY Left    Patient had lift with implant on Left side   . SHOULDER ARTHROSCOPY DISTAL CLAVICLE EXCISION AND OPEN ROTATOR CUFF REPAIR  2011  . TOTAL SHOULDER ARTHROPLASTY  03/14/2012   Procedure: TOTAL SHOULDER ARTHROPLASTY;  Surgeon: Alta Corning, MD;  Location: [facility];  Service: Orthopedics;  Laterality: Left;     OB History   No obstetric history on file.      Home Medications    Prior to Admission medications   Medication Sig Start Date End Date Taking? Authorizing Provider  acetaminophen (TYLENOL) 325 MG tablet Take 650 mg by mouth every 6 (six) hours as needed.    [provider]  albuterol (PROAIR HFA) 108 (90 Base) MCG/ACT inhaler Inhale 2 puffs into the lungs every 6 (six) hours as needed for wheezing or shortness of breath. 04/14/18   [name], MD  atorvastatin (LIPITOR) 20 MG tablet Take 1 tablet (20 mg total) by mouth daily at 6 PM. 01/16/19   [name], MD  Dextromethorphan-Guaifenesin 20-400 MG TABS Take by mouth daily.    [provider]  diphenhydrAMINE ([place]) 25 MG  tablet Take 50 mg by mouth at bedtime as needed for sleep.    [provider]  DM-Doxylamine-Acetaminophen (VICKS NYQUIL MULTI-SYMPTOM PO) Take 1 tablet by mouth daily.    [provider]  famotidine (PEPCID) 20 MG tablet Take 1 tablet (20 mg total) by mouth 2 (two) times daily. 09/09/17   Charlesetta Shanks, MD  fenofibrate (TRICOR) 145 MG tablet TAKE 1 TABLET(145 MG) BY MOUTH DAILY 11/07/18   [name], MD  fluticasone-salmeterol (ADVAIR HFA) [phone] MCG/ACT inhaler Inhale 2 puffs into the lungs 2 (two) times daily. 04/14/18   [name], MD  gabapentin (NEURONTIN) 300 MG capsule TK 1 C PO TID 03/07/18   [provider]  GUAIFENESIN PO Take 400 mg by mouth  2 (two) times daily.    [provider]  ibuprofen (ADVIL,MOTRIN) 100 MG tablet Take 100 mg by mouth every 6 (six) hours as needed.    [provider]  meclizine (ANTIVERT) 25 MG tablet Take 1 tablet (25 mg total) by mouth 3 (three) times daily as needed for dizziness. 04/14/18   [name], MD  methocarbamol (ROBAXIN) 750 MG tablet TK 1 T PO Q 8 H PRF SPASM 04/02/18   [provider]  Multiple Vitamin (MULTIVITAMIN) capsule Take 1 capsule by mouth daily.    [provider]  olmesartan-hydrochlorothiazide (BENICAR HCT) 20-12.5 MG tablet TAKE 1 TABLET BY MOUTH DAILY 11/07/18   [name], MD  oxymetazoline (AFRIN) 0.05 % nasal spray Place 1 spray into both nostrils Nightly.    [provider]    Family History Family History  Problem Relation Age of Onset  . Breast cancer Sister   . Breast cancer Paternal Grandmother   . Anesthesia problems Neg Hx   . Hypotension Neg Hx   . Malignant hyperthermia Neg Hx   . Pseudochol deficiency Neg Hx     Social History Social History   Tobacco Use  . Smoking status: Never Smoker  . Smokeless tobacco: Never Used  Substance Use Topics  . Alcohol use: No    Alcohol/week: 0.0 standard drinks  . Drug use: No     Allergies   Dilaudid [hydromorphone hcl] and Morphine and related   Review of Systems Review of Systems  Constitutional: Negative for fever.  Musculoskeletal: Positive for arthralgias.  Skin: Negative for wound.  Neurological: Negative for numbness.     Physical Exam Updated Vital Signs BP (!) 156/82 (BP Location: Right Arm)   Pulse 88   Temp 98.3 F (36.8 C) (Oral)   Resp 16   Ht 5\' 4"  (1.626 m)   Wt 81.6 kg   SpO2 98%   BMI 30.90 kg/m   Physical Exam Vitals signs and nursing note reviewed.  Constitutional:      General: She is not in acute distress.    Appearance: She is well-developed.  HENT:     Head: Normocephalic and atraumatic.  Eyes:      Conjunctiva/sclera: Conjunctivae normal.  Cardiovascular:     Rate and Rhythm: Normal rate.  Pulmonary:     Effort: Pulmonary effort is normal.  Abdominal:     Palpations: Abdomen is soft.  Musculoskeletal:     Comments: There is some swelling noted to the left foot.  There is slight erythema noted to the distal dorsum of the foot at the base of the toes.  There is tenderness here as well as on the plantar aspect of the foot or the MTP joints.  There is normal active range of motion of the foot and ankle.  There is no tenderness to the arch of the foot or the calcaneus.  No wounds.  Normal sensation and intact pulses.  Skin:    General: Skin is warm.  Neurological:     Mental Status: She is alert.  Psychiatric:        Behavior: Behavior normal.      ED Treatments / Results  Labs (all labs ordered are listed, but only abnormal results are displayed) Labs Reviewed - No data to display  EKG None  Radiology Dg Foot Complete Left  Result Date: 04/10/2019 CLINICAL DATA:  C/o pain to top and bottom of left foot sudden onset while walking Denies known inj some redness and swelling noted EXAM: LEFT FOOT - COMPLETE 3+ VIEW COMPARISON:  None. FINDINGS: Transverse minimally comminuted fracture through the midshaft of the second metatarsal. The distal fracture fragment is displaced laterally approximately 3 mm. Comminuted fragment displaced dorsally several mm. Small calcaneal spur. No other bone abnormality is evident. Soft tissues unremarkable. IMPRESSION: Minimally displaced transverse fracture, midshaft second metatarsal. Electronically Signed   By: Lucrezia Europe M.D.   On: 04/10/2019 14:08    Procedures Procedures (including critical care time)  Medications Ordered in ED Medications - No data to display   Initial Impression / Assessment and Plan / ED Course  I have reviewed the triage vital signs and the nursing notes.  Pertinent labs & imaging results that were available during my  care of the patient were reviewed by me and considered in my medical decision making (see chart for details).        Patient with transverse fracture of the left second metatarsal, closed.  Patient cannot recall a particular injury, though states she has a large dog that pulls when she walks him, and its possible she injured her foot this way.  No history of osteoporosis/osteopenia.  Neurovascularly intact.  Patient has a cam walker boot and crutches at home, is also followed by orthopedics.  Discussed symptomatic management including R ICE therapy, Tylenol, and follow-up with orthopedic specialist.  Nonweightbearing.  Patient agreeable to plan and safe for discharge.  Patient discussed with Dr. Wilson Singer, who agrees with plan.  Discussed results, findings, treatment and follow up. Patient advised of return precautions. Patient verbalized understanding and agreed with plan.  Final Clinical Impressions(s) / ED Diagnoses   Final diagnoses:  Closed displaced fracture of second metatarsal bone of left foot, initial encounter    ED Discharge Orders    None       , [place] N, PA-C 04/10/19 1451    Virgel Manifold, MD 04/12/19 [phone]

## 2019-04-10 NOTE — ED Triage Notes (Signed)
Patient here for left foot pain that started this afternoon after walking around [employer]. No deformity noticed. Hx of tendonitis.

## 2019-04-10 NOTE — Discharge Instructions (Addendum)
Please read instructions below. Apply ice to your foot for 20 minutes at a time.  Elevated as much as possible.  Wear your boot and use your crutches to avoid weightbearing. You can take over-the-counter pain medication as needed for pain. Schedule an appointment with your orthopedic specialist in 1 to 2 weeks for repeat x-ray and follow-up on your injury. Return to the ER for new or concerning symptoms.

## 2019-04-22 ENCOUNTER — Ambulatory Visit: Payer: Medicare Other | Admitting: Family Medicine

## 2019-04-23 ENCOUNTER — Ambulatory Visit (INDEPENDENT_AMBULATORY_CARE_PROVIDER_SITE_OTHER): Payer: Medicare Other | Admitting: Family Medicine

## 2019-04-23 ENCOUNTER — Other Ambulatory Visit: Payer: Self-pay

## 2019-04-23 VITALS — BP 156/82 | Ht 64.0 in | Wt 180.0 lb

## 2019-04-23 DIAGNOSIS — Z0000 Encounter for general adult medical examination without abnormal findings: Secondary | ICD-10-CM

## 2019-04-23 NOTE — Patient Instructions (Addendum)
Thank you for taking time to come for your Medicare Wellness Visit. I appreciate your ongoing commitment to your health goals. Please review the following plan we discussed and let me know if I can assist you in the future.  Julie Greer LPN  Preventive Care 74 Years and Older, Female Preventive care refers to lifestyle choices and visits with your health care provider that can promote health and wellness. This includes:  A yearly physical exam. This is also called an annual well check.  Regular dental and eye exams.  Immunizations.  Screening for certain conditions.  Healthy lifestyle choices, such as diet and exercise. What can I expect for my preventive care visit? Physical exam Your health care provider will check:  Height and weight. These may be used to calculate body mass index (BMI), which is a measurement that tells if you are at a healthy weight.  Heart rate and blood pressure.  Your skin for abnormal spots. Counseling Your health care provider may ask you questions about:  Alcohol, tobacco, and drug use.  Emotional well-being.  Home and relationship well-being.  Sexual activity.  Eating habits.  History of falls.  Memory and ability to understand (cognition).  Work and work environment.  Pregnancy and menstrual history. What immunizations do I need?  Influenza (flu) vaccine  This is recommended every year. Tetanus, diphtheria, and pertussis (Tdap) vaccine  You may need a Td booster every 10 years. Varicella (chickenpox) vaccine  You may need this vaccine if you have not already been vaccinated. Zoster (shingles) vaccine  You may need this after age 60. Pneumococcal conjugate (PCV13) vaccine  One dose is recommended after age 74. Pneumococcal polysaccharide (PPSV23) vaccine  One dose is recommended after age 74. Measles, mumps, and rubella (MMR) vaccine  You may need at least one dose of MMR if you were born in 1957 or later. You may also  need a second dose. Meningococcal conjugate (MenACWY) vaccine  You may need this if you have certain conditions. Hepatitis A vaccine  You may need this if you have certain conditions or if you travel or work in places where you may be exposed to hepatitis A. Hepatitis B vaccine  You may need this if you have certain conditions or if you travel or work in places where you may be exposed to hepatitis B. Haemophilus influenzae type b (Hib) vaccine  You may need this if you have certain conditions. You may receive vaccines as individual doses or as more than one vaccine together in one shot (combination vaccines). Talk with your health care provider about the risks and benefits of combination vaccines. What tests do I need? Blood tests  Lipid and cholesterol levels. These may be checked every 5 years, or more frequently depending on your overall health.  Hepatitis C test.  Hepatitis B test. Screening  Lung cancer screening. You may have this screening every year starting at age 74 if you have a 30-pack-year history of smoking and currently smoke or have quit within the past 15 years.  Colorectal cancer screening. All adults should have this screening starting at age 74 and continuing until age 75. Your health care provider may recommend screening at age 45 if you are at increased risk. You will have tests every 1-10 years, depending on your results and the type of screening test.  Diabetes screening. This is done by checking your blood sugar (glucose) after you have not eaten for a while (fasting). You may have this done every 1-3   years.  Mammogram. This may be done every 1-2 years. Talk with your health care provider about how often you should have regular mammograms.  BRCA-related cancer screening. This may be done if you have a family history of breast, ovarian, tubal, or peritoneal cancers. Other tests  Sexually transmitted disease (STD) testing.  Bone density scan. This is done  to screen for osteoporosis. You may have this done starting at age 74 Follow these instructions at home: Eating and drinking  Eat a diet that includes fresh fruits and vegetables, whole grains, lean protein, and low-fat dairy products. Limit your intake of foods with high amounts of sugar, saturated fats, and salt.  Take vitamin and mineral supplements as recommended by your health care provider.  Do not drink alcohol if your health care provider tells you not to drink.  If you drink alcohol: ? Limit how much you have to 0-1 drink a day. ? Be aware of how much alcohol is in your drink. In the [place] one drink equals one 12 oz bottle of beer (355 mL), one 5 oz glass of wine (148 mL), or one 1 oz glass of hard liquor (44 mL). Lifestyle  Take daily care of your teeth and gums.  Stay active. Exercise for at least 30 minutes on 5 or more days each week.  Do not use any products that contain nicotine or tobacco, such as cigarettes, e-cigarettes, and chewing tobacco. If you need help quitting, ask your health care provider.  If you are sexually active, practice safe sex. Use a condom or other form of protection in order to prevent STIs (sexually transmitted infections).  Talk with your health care provider about taking a low-dose aspirin or statin. What's next?  Go to your health care provider once a year for a well check visit.  Ask your health care provider how often you should have your eyes and teeth checked.  Stay up to date on all vaccines. This information is not intended to replace advice given to you by your health care provider. Make sure you discuss any questions you have with your health care provider. Document Released: 10/28/2015 Document Revised: 09/25/2018 Document Reviewed: 09/25/2018 Elsevier Patient Education  2020 [employer].

## 2019-04-23 NOTE — Progress Notes (Signed)
Presents today for [employer] Visit   Date of last exam: 01/16/2019  Interpreter used for this visit? No  I connected with  [name] on 04/23/19 by a video enabled telemedicine application and verified that I am speaking with the correct person using two identifiers.   .   Patient Care Team: [name], MD as PCP - General (Family Medicine)   Other items to address today:   Discussed Eye/Dental exams Discussed immunizations Follow up scheduled    Other Screening: Last screening for diabetes: 01/23/2019 Last lipid screening: 01/23/2019  ADVANCE DIRECTIVES: Discussed:yes On File: no Materials Provided: yes (mailed)  Immunization status:  Immunization History  Administered Date(s) Administered  . Influenza, High Dose Seasonal PF August 25, 1946  . Influenza-Unspecified 08/15/2016, 08/17/2017  . Tdap 02/02/2014     Health Maintenance Due  Topic Date Due  . COLONOSCOPY  10/15/2016  . PNA vac Low Risk Adult (2 of 2 - PPSV23) 08/29/2017     Functional Status Survey: Is the patient deaf or have difficulty hearing?: No Does the patient have difficulty seeing, even when wearing glasses/contacts?: No Does the patient have difficulty concentrating, remembering, or making decisions?: No Does the patient have difficulty walking or climbing stairs?: No Does the patient have difficulty dressing or bathing?: No Does the patient have difficulty doing errands alone such as visiting a doctor's office or shopping?: No   6CIT Screen 04/23/2019 04/14/2018  What Year? 0 points 0 points  What month? 0 points 0 points  What time? 0 points 0 points  Count back from 20 - 0 points  Months in reverse 0 points 0 points  Repeat phrase 0 points 2 points  Total Score - 2        Clinical Support from 04/23/2019 in [facility] at [place]  AUDIT-C Score  0       Home Environment:  Lives in an apartment   No trouble climbing stairs No scattered rugs No  grab bars Adequate lighting   Patient Active Problem List   Diagnosis Date Noted  . Essential hypertension 08/19/2017  . Hyperlipidemia 08/19/2017  . Cough variant asthma 04/04/2016  . Upper airway cough syndrome 04/04/2016  . Osteoarthritis of left shoulder 03/24/2012     Past Medical History:  Diagnosis Date  . Arthritis   . GERD (gastroesophageal reflux disease)    at times  . Hyperlipidemia   . Hypertension   . Peripheral vascular disease ([place])    has spider veins  . Pneumothorax, traumatic    RIGHT; associated with multiple rib fractures after a fall     Past Surgical History:  Procedure Laterality Date  . ABDOMINAL HYSTERECTOMY  1982  . AUGMENTATION MAMMAPLASTY Bilateral    Patient has bilateral implants   . 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 BREAST SURGERY    . JOINT REPLACEMENT  2009   right knee  . JOINT REPLACEMENT  2011   left knee  . MASTECTOMY  2008   right breast  . REDUCTION MAMMAPLASTY Left    Patient had lift with implant on Left side   . SHOULDER ARTHROSCOPY DISTAL CLAVICLE EXCISION AND OPEN ROTATOR CUFF REPAIR  2011  . TOTAL SHOULDER ARTHROPLASTY  03/14/2012   Procedure: TOTAL SHOULDER ARTHROPLASTY;  Surgeon: Alta Corning, MD;  Location: [facility];  Service: Orthopedics;  Laterality: Left;     Family History  Problem Relation Age of Onset  . Breast cancer  Sister   . Breast cancer Paternal Grandmother   . Anesthesia problems Neg Hx   . Hypotension Neg Hx   . Malignant hyperthermia Neg Hx   . Pseudochol deficiency Neg Hx      Social History   Socioeconomic History  . Marital status: Divorced    Spouse name: Not on file  . Number of children: 2  . Years of education: Not on file  . Highest education level: Not on file  Occupational History  . Occupation: Retired  . Occupation: Glass blower/designer    Comment: Arts development officer  Social Needs  . Financial resource strain: Not on file  . Food insecurity    Worry: Not on file     Inability: Not on file  . Transportation needs    Medical: Not on file    Non-medical: Not on file  Tobacco Use  . Smoking status: Never Smoker  . Smokeless tobacco: Never Used  Substance and Sexual Activity  . Alcohol use: No    Alcohol/week: 0.0 standard drinks  . Drug use: No  . Sexual activity: Not on file  Lifestyle  . Physical activity    Days per week: Not on file    Minutes per session: Not on file  . Stress: Not on file  Relationships  . Social Herbalist on phone: Not on file    Gets together: Not on file    Attends religious service: Not on file    Active member of club or organization: Not on file    Attends meetings of clubs or organizations: Not on file    Relationship status: Not on file  . Intimate partner violence    Fear of current or ex partner: Not on file    Emotionally abused: Not on file    Physically abused: Not on file    Forced sexual activity: Not on file  Other Topics Concern  . Not on file  Social History Narrative  . Not on file     Allergies  Allergen Reactions  . Dilaudid [Hydromorphone Hcl] Itching  . Morphine And Related Nausea And Vomiting     Prior to Admission medications   Medication Sig Start Date End Date Taking? Authorizing Provider  acetaminophen (TYLENOL) 325 MG tablet Take 650 mg by mouth every 6 (six) hours as needed.   Yes [provider]  albuterol (PROAIR HFA) 108 (90 Base) MCG/ACT inhaler Inhale 2 puffs into the lungs every 6 (six) hours as needed for wheezing or shortness of breath. 04/14/18  Yes [name], MD  atorvastatin (LIPITOR) 20 MG tablet Take 1 tablet (20 mg total) by mouth daily at 6 PM. 01/16/19  Yes [name], MD  diphenhydrAMINE (SOMINEX) 25 MG tablet Take 50 mg by mouth at bedtime as needed for sleep.   Yes [provider]  famotidine (PEPCID) 20 MG tablet Take 1 tablet (20 mg total) by mouth 2 (two) times daily. 09/09/17  Yes Charlesetta Shanks, MD  fenofibrate  (TRICOR) 145 MG tablet TAKE 1 TABLET(145 MG) BY MOUTH DAILY 11/07/18  Yes [name], MD  fluticasone-salmeterol (ADVAIR [facility]) 115-21 MCG/ACT inhaler Inhale 2 puffs into the lungs 2 (two) times daily. 04/14/18  Yes [name], MD  gabapentin (NEURONTIN) 300 MG capsule TK 1 C PO TID 03/07/18  Yes [provider]  GUAIFENESIN PO Take 400 mg by mouth 2 (two) times daily.   Yes [provider]  methocarbamol (ROBAXIN) 750  MG tablet TK 1 T PO Q 8 H PRF SPASM 04/02/18  Yes [provider]  olmesartan-hydrochlorothiazide (BENICAR HCT) 20-12.5 MG tablet TAKE 1 TABLET BY MOUTH DAILY 11/07/18  Yes [name], MD  Dextromethorphan-Guaifenesin 20-400 MG TABS Take by mouth daily.    [provider]  DM-Doxylamine-Acetaminophen (VICKS NYQUIL MULTI-SYMPTOM PO) Take 1 tablet by mouth daily.    [provider]  ibuprofen (ADVIL,MOTRIN) 100 MG tablet Take 100 mg by mouth every 6 (six) hours as needed.    [provider]  meclizine (ANTIVERT) 25 MG tablet Take 1 tablet (25 mg total) by mouth 3 (three) times daily as needed for dizziness. Patient not taking: Reported on 04/23/2019 04/14/18   [name], MD  Multiple Vitamin (MULTIVITAMIN) capsule Take 1 capsule by mouth daily.    [provider]  oxymetazoline (AFRIN) 0.05 % nasal spray Place 1 spray into both nostrils Nightly.    [provider]     Depression screen [facility] 2/9 04/23/2019 01/16/2019 05/12/2018 04/14/2018 08/30/2017  Decreased Interest 0 0 0 0 0  Down, Depressed, Hopeless 0 0 0 0 0  PHQ - 2 Score 0 0 0 0 0     Fall Risk  04/23/2019 01/16/2019 08/28/2018 05/12/2018 04/14/2018  Falls in the past year? 0 0 0 No No  Number falls in past yr: 0 - - - -  Injury with Fall? 0 - - - -  Follow up Falls evaluation completed;Education provided;Falls prevention discussed - - - -      PHYSICAL EXAM: BP (!) 156/82   Ht 5\' 4"  (1.626 m)   Wt 180 lb (81.6 kg)   BMI 30.90 kg/m     Wt Readings from Last 3 Encounters:  04/23/19 180 lb (81.6 kg)  04/10/19 180 lb (81.6 kg)  08/28/18 180 lb 9.6 oz (81.9 kg)     No exam data present    Physical Exam   Education/Counseling provided regarding diet and exercise, prevention of chronic diseases, smoking/tobacco cessation, if applicable, and reviewed "Covered Medicare Preventive Services."

## 2019-05-31 ENCOUNTER — Encounter (HOSPITAL_BASED_OUTPATIENT_CLINIC_OR_DEPARTMENT_OTHER): Payer: Self-pay | Admitting: Emergency Medicine

## 2019-05-31 ENCOUNTER — Other Ambulatory Visit: Payer: Self-pay

## 2019-05-31 ENCOUNTER — Emergency Department (HOSPITAL_BASED_OUTPATIENT_CLINIC_OR_DEPARTMENT_OTHER)
Admission: EM | Admit: 2019-05-31 | Discharge: 2019-05-31 | Disposition: A | Discharge status: Home / Self Care | Payer: Medicare Other | Attending: Emergency Medicine | Admitting: Emergency Medicine

## 2019-05-31 ENCOUNTER — Emergency Department (HOSPITAL_BASED_OUTPATIENT_CLINIC_OR_DEPARTMENT_OTHER): Payer: Medicare Other

## 2019-05-31 DIAGNOSIS — I10 Essential (primary) hypertension: Secondary | ICD-10-CM | POA: Insufficient documentation

## 2019-05-31 DIAGNOSIS — J209 Acute bronchitis, unspecified: Secondary | ICD-10-CM | POA: Insufficient documentation

## 2019-05-31 DIAGNOSIS — Z79899 Other long term (current) drug therapy: Secondary | ICD-10-CM | POA: Diagnosis not present

## 2019-05-31 DIAGNOSIS — N390 Urinary tract infection, site not specified: Secondary | ICD-10-CM | POA: Diagnosis not present

## 2019-05-31 DIAGNOSIS — E785 Hyperlipidemia, unspecified: Secondary | ICD-10-CM | POA: Insufficient documentation

## 2019-05-31 DIAGNOSIS — R509 Fever, unspecified: Secondary | ICD-10-CM | POA: Diagnosis not present

## 2019-05-31 DIAGNOSIS — Z20828 Contact with and (suspected) exposure to other viral communicable diseases: Secondary | ICD-10-CM | POA: Diagnosis not present

## 2019-05-31 DIAGNOSIS — R05 Cough: Secondary | ICD-10-CM | POA: Diagnosis not present

## 2019-05-31 LAB — URINALYSIS, ROUTINE W REFLEX MICROSCOPIC
Bilirubin Urine: NEGATIVE
Glucose, UA: NEGATIVE mg/dL
Hgb urine dipstick: NEGATIVE
Ketones, ur: NEGATIVE mg/dL
Nitrite: POSITIVE — AB
Protein, ur: NEGATIVE mg/dL
Specific Gravity, Urine: 1.01 (ref 1.005–1.030)
pH: 6 (ref 5.0–8.0)

## 2019-05-31 LAB — CBC WITH DIFFERENTIAL/PLATELET
Abs Immature Granulocytes: 0.02 10*3/uL (ref 0.00–0.07)
Basophils Absolute: 0 10*3/uL (ref 0.0–0.1)
Basophils Relative: 1 %
Eosinophils Absolute: 0.2 10*3/uL (ref 0.0–0.5)
Eosinophils Relative: 3 %
HCT: 35.5 % — ABNORMAL LOW (ref 36.0–46.0)
Hemoglobin: 12 g/dL (ref 12.0–15.0)
Immature Granulocytes: 0 %
Lymphocytes Relative: 22 %
Lymphs Abs: 1.6 10*3/uL (ref 0.7–4.0)
MCH: 32.3 pg (ref 26.0–34.0)
MCHC: 33.8 g/dL (ref 30.0–36.0)
MCV: 95.4 fL (ref 80.0–100.0)
Monocytes Absolute: 0.5 10*3/uL (ref 0.1–1.0)
Monocytes Relative: 7 %
Neutro Abs: 4.9 10*3/uL (ref 1.7–7.7)
Neutrophils Relative %: 67 %
Platelets: 360 10*3/uL (ref 150–400)
RBC: 3.72 MIL/uL — ABNORMAL LOW (ref 3.87–5.11)
RDW: 11.6 % (ref 11.5–15.5)
WBC: 7.2 10*3/uL (ref 4.0–10.5)
nRBC: 0 % (ref 0.0–0.2)

## 2019-05-31 LAB — COMPREHENSIVE METABOLIC PANEL
ALT: 14 U/L (ref 0–44)
AST: 19 U/L (ref 15–41)
Albumin: 4.4 g/dL (ref 3.5–5.0)
Alkaline Phosphatase: 61 U/L (ref 38–126)
Anion gap: 13 (ref 5–15)
BUN: 18 mg/dL (ref 8–23)
CO2: 22 mmol/L (ref 22–32)
Calcium: 9.7 mg/dL (ref 8.9–10.3)
Chloride: 101 mmol/L (ref 98–111)
Creatinine, Ser: 0.75 mg/dL (ref 0.44–1.00)
GFR calc Af Amer: >60 mL/min (ref >60)
GFR calc non Af Amer: >60 mL/min (ref >60)
Glucose, Bld: 139 mg/dL — ABNORMAL HIGH (ref 70–99)
Potassium: 3.3 mmol/L — ABNORMAL LOW (ref 3.5–5.1)
Sodium: 136 mmol/L (ref 135–145)
Total Bilirubin: 0.7 mg/dL (ref 0.3–1.2)
Total Protein: 7.3 g/dL (ref 6.5–8.1)

## 2019-05-31 LAB — LIPASE, BLOOD: Lipase: 27 U/L (ref 11–51)

## 2019-05-31 LAB — URINALYSIS, MICROSCOPIC (REFLEX): RBC / HPF: NONE SEEN RBC/hpf (ref 0–5)

## 2019-05-31 MED ORDER — PREDNISONE 20 MG PO TABS: 60.0000 mg | ORAL_TABLET | Freq: Every day | ORAL | 0 refills | Status: AC

## 2019-05-31 MED ORDER — CEPHALEXIN 500 MG PO CAPS: 500.0000 mg | ORAL_CAPSULE | Freq: Two times a day (BID) | ORAL | 0 refills | Status: DC

## 2019-05-31 MED ORDER — PROCHLORPERAZINE EDISYLATE 10 MG/2ML IJ SOLN
INTRAMUSCULAR | Status: AC
  Filled 2019-05-31: qty 2

## 2019-05-31 MED ORDER — ALBUTEROL SULFATE HFA 108 (90 BASE) MCG/ACT IN AERS
6.0000 | INHALATION_SPRAY | Freq: Once | RESPIRATORY_TRACT | Status: AC
  Administered 2019-05-31: 6 via RESPIRATORY_TRACT
  Filled 2019-05-31: qty 6.7

## 2019-05-31 MED ORDER — SODIUM CHLORIDE 0.9 % IV BOLUS
1000.0000 mL | Freq: Once | INTRAVENOUS | Status: AC
  Administered 2019-05-31: 1000 mL via INTRAVENOUS

## 2019-05-31 MED ORDER — CEPHALEXIN 500 MG PO CAPS: 500.0000 mg | ORAL_CAPSULE | Freq: Two times a day (BID) | ORAL | 0 refills | Status: AC

## 2019-05-31 MED ORDER — PREDNISONE 50 MG PO TABS
60.0000 mg | ORAL_TABLET | Freq: Once | ORAL | Status: AC
  Administered 2019-05-31: 60 mg via ORAL
  Filled 2019-05-31: qty 1

## 2019-05-31 MED ORDER — ONDANSETRON HCL 4 MG/2ML IJ SOLN
4.0000 mg | Freq: Once | INTRAMUSCULAR | Status: AC
  Administered 2019-05-31: 4 mg via INTRAVENOUS
  Filled 2019-05-31: qty 2

## 2019-05-31 MED ORDER — PROCHLORPERAZINE EDISYLATE 10 MG/2ML IJ SOLN
10.0000 mg | Freq: Once | INTRAMUSCULAR | Status: AC
  Administered 2019-05-31: 10 mg via INTRAVENOUS
  Filled 2019-05-31: qty 2

## 2019-05-31 NOTE — ED Notes (Signed)
Portable xray at bedside.

## 2019-05-31 NOTE — ED Notes (Signed)
MD at bedside. 

## 2019-05-31 NOTE — ED Notes (Signed)
Ambulated to bathroom to provide specimen 

## 2019-05-31 NOTE — ED Triage Notes (Signed)
Pt reports vomiting and nausea since last night. Also states her asthma is acting up. She is using inhalers without relief.

## 2019-05-31 NOTE — ED Provider Notes (Addendum)
[facility] Provider Note   CSN: [id number] Arrival date & time: 05/31/19  1049    History   Chief Complaint Chief Complaint  Patient presents with  . Emesis  . Cough    HPI [name] is a 73 y.o. female.     The history is provided by the patient.  Cough Cough characteristics:  Non-productive Sputum characteristics:  Nondescript Severity:  Mild Onset quality:  Gradual Duration:  1 week Timing:  Intermittent Progression:  Waxing and waning Chronicity:  Recurrent Smoker: no   Context: with activity   Relieved by:  Beta-agonist inhaler Worsened by:  Nothing Associated symptoms: wheezing   Associated symptoms: no chest pain, no chills, no ear pain, no fever, no rash, no shortness of breath and no sore throat   Risk factors: no recent infection     Past Medical History:  Diagnosis Date  . Arthritis   . GERD (gastroesophageal reflux disease)    at times  . Hyperlipidemia   . Hypertension   . Peripheral vascular disease ([place])    has spider veins  . Pneumothorax, traumatic    RIGHT; associated with multiple rib fractures after a fall    Patient Active Problem List   Diagnosis Date Noted  . Essential hypertension 08/19/2017  . Hyperlipidemia 08/19/2017  . Cough variant asthma 04/04/2016  . Upper airway cough syndrome 04/04/2016  . 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 ABDOMINAL HYSTERECTOMY  1982  . AUGMENTATION MAMMAPLASTY Bilateral    Patient has bilateral implants   . 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 BREAST SURGERY    . JOINT REPLACEMENT  2009   right knee  . JOINT REPLACEMENT  2011   left knee  . MASTECTOMY  2008   right breast  . REDUCTION MAMMAPLASTY Left    Patient had lift with implant on Left side   . SHOULDER ARTHROSCOPY DISTAL CLAVICLE EXCISION AND OPEN ROTATOR CUFF REPAIR  2011  . TOTAL SHOULDER ARTHROPLASTY  03/14/2012   Procedure: TOTAL SHOULDER ARTHROPLASTY;  Surgeon: Alta Corning, MD;  Location: [facility];  Service: Orthopedics;  Laterality: Left;     OB History   No obstetric history on file.      Home Medications    Prior to Admission medications   Medication Sig Start Date End Date Taking? Authorizing Provider  acetaminophen (TYLENOL) 325 MG tablet Take 650 mg by mouth every 6 (six) hours as needed.    [provider]  albuterol (PROAIR HFA) 108 (90 Base) MCG/ACT inhaler Inhale 2 puffs into the lungs every 6 (six) hours as needed for wheezing or shortness of breath. 04/14/18   [name], MD  atorvastatin (LIPITOR) 20 MG tablet Take 1 tablet (20 mg total) by mouth daily at 6 PM. 01/16/19   [name], MD  cephALEXin (KEFLEX) 500 MG capsule Take 1 capsule (500 mg total) by mouth 2 (two) times daily for 5 days. 05/31/19 06/05/19  Curatolo, Adam, DO  Dextromethorphan-Guaifenesin 20-400 MG TABS Take by mouth daily.    [provider]  diphenhydrAMINE (SOMINEX) 25 MG tablet Take 50 mg by mouth at bedtime as needed for sleep.    [provider]  DM-Doxylamine-Acetaminophen (VICKS NYQUIL MULTI-SYMPTOM PO) Take 1 tablet by mouth daily.    [provider]  famotidine (PEPCID) 20 MG tablet Take 1 tablet (20 mg total) by mouth 2 (  two) times daily. 09/09/17   Charlesetta Shanks, MD  fenofibrate (TRICOR) 145 MG tablet TAKE 1 TABLET(145 MG) BY MOUTH DAILY 11/07/18   [name], MD  fluticasone-salmeterol (ADVAIR HFA) [phone] MCG/ACT inhaler Inhale 2 puffs into the lungs 2 (two) times daily. 04/14/18   [name], MD  gabapentin (NEURONTIN) 300 MG capsule TK 1 C PO TID 03/07/18   [provider]  GUAIFENESIN PO Take 400 mg by mouth 2 (two) times daily.    [provider]  ibuprofen (ADVIL,MOTRIN) 100 MG tablet Take 100 mg by mouth every 6 (six) hours as needed.    [provider]  meclizine (ANTIVERT) 25 MG tablet Take 1 tablet (25 mg total)  by mouth 3 (three) times daily as needed for dizziness. Patient not taking: Reported on 04/23/2019 04/14/18   [name], MD  methocarbamol (ROBAXIN) 750 MG tablet TK 1 T PO Q 8 H PRF SPASM 04/02/18   [provider]  Multiple Vitamin (MULTIVITAMIN) capsule Take 1 capsule by mouth daily.    [provider]  olmesartan-hydrochlorothiazide (BENICAR HCT) 20-12.5 MG tablet TAKE 1 TABLET BY MOUTH DAILY 11/07/18   [name], MD  oxymetazoline (AFRIN) 0.05 % nasal spray Place 1 spray into both nostrils Nightly.    [provider]  predniSONE (DELTASONE) 20 MG tablet Take 3 tablets (60 mg total) by mouth daily for 4 days. 05/31/19 06/04/19  Lennice Sites, DO    Family History Family History  Problem Relation Age of Onset  . Breast cancer Sister   . Breast cancer Paternal Grandmother   . Anesthesia problems Neg Hx   . Hypotension Neg Hx   . Malignant hyperthermia Neg Hx   . Pseudochol deficiency Neg Hx     Social History Social History   Tobacco Use  . Smoking status: Never Smoker  . Smokeless tobacco: Never Used  Substance Use Topics  . Alcohol use: No    Alcohol/week: 0.0 standard drinks  . Drug use: No     Allergies   Dilaudid [hydromorphone hcl] and Morphine and related   Review of Systems Review of Systems  Constitutional: Negative for chills and fever.  HENT: Negative for ear pain and sore throat.   Eyes: Negative for pain and visual disturbance.  Respiratory: Positive for cough and wheezing. Negative for shortness of breath.   Cardiovascular: Negative for chest pain and palpitations.  Gastrointestinal: Positive for nausea and vomiting. Negative for abdominal pain.  Genitourinary: Negative for dysuria and hematuria.  Musculoskeletal: Negative for arthralgias and back pain.  Skin: Negative for color change and rash.  Neurological: Negative for seizures and syncope.  All other systems reviewed and are negative.    Physical Exam  Updated Vital Signs BP 130/60 (BP Location: Left Arm)   Pulse 78   Temp 98.5 F (36.9 C) (Oral)   Resp 20   Ht 5\' 4"  (1.626 m)   Wt 81.6 kg   SpO2 97%   BMI 30.90 kg/m   Physical Exam Vitals signs and nursing note reviewed.  Constitutional:      General: She is not in acute distress.    Appearance: She is well-developed.  HENT:     Head: Normocephalic and atraumatic.     Nose: Nose normal.     Mouth/Throat:     Mouth: Mucous membranes are moist.  Eyes:     Extraocular Movements: Extraocular movements intact.     Conjunctiva/sclera: Conjunctivae normal.     Pupils:  Pupils are equal, round, and reactive to light.  Neck:     Musculoskeletal: Normal range of motion and neck supple.  Cardiovascular:     Rate and Rhythm: Normal rate and regular rhythm.     Pulses: Normal pulses.     Heart sounds: Normal heart sounds. No murmur.  Pulmonary:     Effort: Pulmonary effort is normal. No respiratory distress.     Breath sounds: Wheezing present.  Abdominal:     General: There is no distension.     Palpations: Abdomen is soft.     Tenderness: There is no abdominal tenderness. There is guarding. There is no rebound.     Hernia: No hernia is present.  Musculoskeletal: Normal range of motion.     Right lower leg: No edema.     Left lower leg: No edema.  Skin:    General: Skin is warm and dry.     Capillary Refill: Capillary refill takes less than 2 seconds.  Neurological:     General: No focal deficit present.     Mental Status: She is alert and oriented to person, place, and time.  Psychiatric:        Mood and Affect: Mood normal.      ED Treatments / Results  Labs (all labs ordered are listed, but only abnormal results are displayed) Labs Reviewed  CBC WITH DIFFERENTIAL/PLATELET - Abnormal; Notable for the following components:      Result Value   RBC 3.72 (*)    HCT 35.5 (*)    All other components within normal limits  COMPREHENSIVE METABOLIC PANEL - Abnormal;  Notable for the following components:   Potassium 3.3 (*)    Glucose, Bld 139 (*)    All other components within normal limits  URINALYSIS, ROUTINE W REFLEX MICROSCOPIC - Abnormal; Notable for the following components:   APPearance HAZY (*)    Nitrite POSITIVE (*)    Leukocytes,Ua SMALL (*)    All other components within normal limits  URINALYSIS, MICROSCOPIC (REFLEX) - Abnormal; Notable for the following components:   Bacteria, UA MANY (*)    All other components within normal limits  NOVEL CORONAVIRUS, NAA (HOSPITAL ORDER, SEND-OUT TO REF LAB)  LIPASE, BLOOD    EKG EKG Interpretation  Date/Time:  Sunday May 31 2019 11:51:21 EDT Ventricular Rate:  87 PR Interval:    QRS Duration: 91 QT Interval:  364 QTC Calculation: 438 R Axis:   69 Text Interpretation:  Sinus rhythm Baseline wander in lead(s) V1 Confirmed by Lennice Sites [phone]) on 05/31/2019 1:40:52 PM   Radiology Dg Chest Portable 1 View  Result Date: 05/31/2019 CLINICAL DATA:  Cough x 1 week; no fever; h/o asthma; achy; using inhaler w/o relief; mailaise; Vomiting; nausea; abdominal paincough EXAM: PORTABLE CHEST 1 VIEW COMPARISON:  Radiograph 08/23/2017 FINDINGS: Normal mediastinum and cardiac silhouette. Normal pulmonary vasculature. No evidence of effusion, infiltrate, or pneumothorax. No acute bony abnormality. Remote RIGHT chest wall rib fractures IMPRESSION: No acute cardiopulmonary process. Electronically Signed   By: Suzy Bouchard M.D.   On: 05/31/2019 12:05    Procedures Procedures (including critical care time)  Medications Ordered in ED Medications  ondansetron (ZOFRAN) injection 4 mg (4 mg Intravenous Given 05/31/19 1128)  sodium chloride 0.9 % bolus 1,000 mL (0 mLs Intravenous Stopped 05/31/19 1232)  albuterol (VENTOLIN HFA) 108 (90 Base) MCG/ACT inhaler 6 puff (6 puffs Inhalation Given 05/31/19 1148)  predniSONE (DELTASONE) tablet 60 mg (60 mg Oral Given 05/31/19 1321)  prochlorperazine (  COMPAZINE)  injection 10 mg (10 mg Intravenous Given 05/31/19 1321)     Initial Impression / Assessment and Plan / ED Course  I have reviewed the triage vital signs and the nursing notes.  Pertinent labs & imaging results that were available during my care of the patient were reviewed by me and considered in my medical decision making (see chart for details).     [name] is a 73 year old female history of asthma, high cholesterol, reflux who presents the ED with cough, emesis.  Patient with normal vitals.  No fever.  Patient with worsening cough over the last week.  Has had some improvement with albuterol.  Started to develop some nausea and vomiting last night.  No abdominal pain.  Symptoms occur after coughing.  She has some mild wheezing on exam.  No abdominal tenderness on exam.  Overall is well-appearing.  Will evaluate with lab work, chest x-ray.  May consider coronavirus testing as well.  Will treat with IV fluids, IV Zofran, albuterol.  Does not have any chest pain, no shortness of breath.  No concern for ACS or PE.  Possibly reactive airway disease/viral illness.  Chest x-ray unremarkable.  No significant electrolyte abnormality, kidney injury, leukocytosis.  Patient felt better after breathing treatment, Zofran.  Patient to be swab for coronavirus.  Will start steroids for likely asthma exacerbation.  Possibly some posttussis emesis.  Given return precautions and discharged in ED in good condition.  Given information about self isolation at home. Possible UTI, will treat with kelfex.  [name] was evaluated in Emergency Department on 05/31/2019 for the symptoms described in the history of present illness. She was evaluated in the context of the global COVID-19 pandemic, which necessitated consideration that the patient might be at risk for infection with the SARS-CoV-2 virus that causes COVID-19. Institutional protocols and algorithms that pertain to the evaluation of patients at risk for  COVID-19 are in a state of rapid change based on information released by regulatory bodies including the CDC and federal and state organizations. These policies and algorithms were followed during the patient's care in the ED.   This chart was dictated using voice recognition software.  Despite best efforts to proofread,  errors can occur which can change the documentation meaning.    Final Clinical Impressions(s) / ED Diagnoses   Final diagnoses:  Acute bronchitis, unspecified organism  Urinary tract infection without hematuria, site unspecified    ED Discharge Orders         Ordered    predniSONE (DELTASONE) 20 MG tablet  Daily     05/31/19 1341    cephALEXin (KEFLEX) 500 MG capsule  2 times daily,   Status:  Discontinued     05/31/19 1359    cephALEXin (KEFLEX) 500 MG capsule  2 times daily     05/31/19 1400           Lennice Sites, DO 05/31/19 1341    Curatolo, Adam, DO 05/31/19 1400

## 2019-06-01 ENCOUNTER — Encounter (HOSPITAL_BASED_OUTPATIENT_CLINIC_OR_DEPARTMENT_OTHER): Payer: Self-pay | Admitting: *Deleted

## 2019-06-01 ENCOUNTER — Emergency Department (HOSPITAL_BASED_OUTPATIENT_CLINIC_OR_DEPARTMENT_OTHER)
Admission: EM | Admit: 2019-06-01 | Discharge: 2019-06-02 | Disposition: A | Discharge status: Home / Self Care | Payer: Medicare Other | Attending: Emergency Medicine | Admitting: Emergency Medicine

## 2019-06-01 ENCOUNTER — Other Ambulatory Visit: Payer: Self-pay

## 2019-06-01 ENCOUNTER — Emergency Department (HOSPITAL_BASED_OUTPATIENT_CLINIC_OR_DEPARTMENT_OTHER): Payer: Medicare Other

## 2019-06-01 DIAGNOSIS — R112 Nausea with vomiting, unspecified: Secondary | ICD-10-CM | POA: Diagnosis not present

## 2019-06-01 DIAGNOSIS — I10 Essential (primary) hypertension: Secondary | ICD-10-CM | POA: Diagnosis not present

## 2019-06-01 DIAGNOSIS — R05 Cough: Secondary | ICD-10-CM | POA: Insufficient documentation

## 2019-06-01 DIAGNOSIS — Z885 Allergy status to narcotic agent status: Secondary | ICD-10-CM | POA: Insufficient documentation

## 2019-06-01 DIAGNOSIS — E785 Hyperlipidemia, unspecified: Secondary | ICD-10-CM | POA: Diagnosis not present

## 2019-06-01 DIAGNOSIS — J069 Acute upper respiratory infection, unspecified: Secondary | ICD-10-CM | POA: Diagnosis not present

## 2019-06-01 DIAGNOSIS — R059 Cough, unspecified: Secondary | ICD-10-CM

## 2019-06-01 DIAGNOSIS — Z79899 Other long term (current) drug therapy: Secondary | ICD-10-CM | POA: Insufficient documentation

## 2019-06-01 LAB — BASIC METABOLIC PANEL
Anion gap: 14 (ref 5–15)
BUN: 21 mg/dL (ref 8–23)
CO2: 23 mmol/L (ref 22–32)
Calcium: 10 mg/dL (ref 8.9–10.3)
Chloride: 99 mmol/L (ref 98–111)
Creatinine, Ser: 0.81 mg/dL (ref 0.44–1.00)
GFR calc Af Amer: >60 mL/min (ref >60)
GFR calc non Af Amer: >60 mL/min (ref >60)
Glucose, Bld: 135 mg/dL — ABNORMAL HIGH (ref 70–99)
Potassium: 3.4 mmol/L — ABNORMAL LOW (ref 3.5–5.1)
Sodium: 136 mmol/L (ref 135–145)

## 2019-06-01 LAB — CBC
HCT: 37.8 % (ref 36.0–46.0)
Hemoglobin: 12.7 g/dL (ref 12.0–15.0)
MCH: 32.2 pg (ref 26.0–34.0)
MCHC: 33.6 g/dL (ref 30.0–36.0)
MCV: 95.7 fL (ref 80.0–100.0)
Platelets: 457 10*3/uL — ABNORMAL HIGH (ref 150–400)
RBC: 3.95 MIL/uL (ref 3.87–5.11)
RDW: 11.7 % (ref 11.5–15.5)
WBC: 9.6 10*3/uL (ref 4.0–10.5)
nRBC: 0 % (ref 0.0–0.2)

## 2019-06-01 LAB — NOVEL CORONAVIRUS, NAA (HOSP ORDER, SEND-OUT TO REF LAB; TAT 18-24 HRS): SARS-CoV-2, NAA: NOT DETECTED

## 2019-06-01 MED ORDER — PROMETHAZINE HCL 25 MG/ML IJ SOLN
12.5000 mg | Freq: Once | INTRAMUSCULAR | Status: AC
  Administered 2019-06-01: 12.5 mg via INTRAVENOUS
  Filled 2019-06-01: qty 1

## 2019-06-01 MED ORDER — PROMETHAZINE HCL 25 MG RE SUPP: 25.0000 mg | Freq: Four times a day (QID) | RECTAL | 0 refills | Status: DC | PRN

## 2019-06-01 MED ORDER — SODIUM CHLORIDE 0.9 % IV BOLUS
1000.0000 mL | Freq: Once | INTRAVENOUS | Status: AC
  Administered 2019-06-01: 1000 mL via INTRAVENOUS

## 2019-06-01 MED ORDER — ONDANSETRON HCL 4 MG/2ML IJ SOLN
INTRAMUSCULAR | Status: AC
  Filled 2019-06-01: qty 2

## 2019-06-01 MED ORDER — ONDANSETRON HCL 4 MG/2ML IJ SOLN
4.0000 mg | Freq: Once | INTRAMUSCULAR | Status: AC
  Administered 2019-06-01: 4 mg via INTRAVENOUS

## 2019-06-01 NOTE — ED Provider Notes (Signed)
[facility] EMERGENCY DEPARTMENT Provider Note   CSN: [id number] Arrival date & time: 06/01/19  1728     History   Chief Complaint Chief Complaint  Patient presents with   Nausea    HPI [name] is a 73 y.o. female.     The history is provided by the patient and medical records. No language interpreter was used.  Emesis Severity:  Severe Duration:  2 days Timing:  Constant Quality:  Stomach contents Progression:  Unchanged Chronicity:  Recurrent Recent urination:  Normal Relieved by:  Nothing Worsened by:  Nothing Ineffective treatments:  Antiemetics Associated symptoms: cough and URI   Associated symptoms: no abdominal pain, no chills, no diarrhea, no fever, no headaches and no sore throat     Past Medical History:  Diagnosis Date   Arthritis    GERD (gastroesophageal reflux disease)    at times   Hyperlipidemia    Hypertension    Peripheral vascular disease (HCC)    has spider veins   Pneumothorax, traumatic    RIGHT; associated with multiple rib fractures after a fall    Patient Active Problem List   Diagnosis Date Noted   Essential hypertension 08/19/2017   Hyperlipidemia 08/19/2017   Cough variant asthma 04/04/2016   Upper airway cough syndrome 04/04/2016   Osteoarthritis of left shoulder 03/24/2012    Past Surgical History:  Procedure Laterality Date   ABDOMINAL HYSTERECTOMY  1982   AUGMENTATION MAMMAPLASTY Bilateral    Patient has bilateral implants    AXILLARY SURGERY  2007   due to [place]  2009   right knee   JOINT REPLACEMENT  2011   left knee   MASTECTOMY  2008   right breast   REDUCTION MAMMAPLASTY Left    Patient had lift with implant on Left side    SHOULDER ARTHROSCOPY DISTAL CLAVICLE EXCISION AND OPEN ROTATOR CUFF REPAIR  2011   TOTAL SHOULDER ARTHROPLASTY  03/14/2012   Procedure: TOTAL SHOULDER ARTHROPLASTY;  Surgeon: Alta Corning, MD;  Location: [facility];  Service: Orthopedics;  Laterality: Left;     OB History   No obstetric history on file.      Home Medications    Prior to Admission medications   Medication Sig Start Date End Date Taking? Authorizing Provider  acetaminophen (TYLENOL) 325 MG tablet Take 650 mg by mouth every 6 (six) hours as needed.    [provider]  albuterol (PROAIR HFA) 108 (90 Base) MCG/ACT inhaler Inhale 2 puffs into the lungs every 6 (six) hours as needed for wheezing or shortness of breath. 04/14/18   [name], MD  atorvastatin (LIPITOR) 20 MG tablet Take 1 tablet (20 mg total) by mouth daily at 6 PM. 01/16/19   [name], MD  cephALEXin (KEFLEX) 500 MG capsule Take 1 capsule (500 mg total) by mouth 2 (two) times daily for 5 days. 05/31/19 06/05/19  Curatolo, Adam, DO  Dextromethorphan-Guaifenesin 20-400 MG TABS Take by mouth daily.    [provider]  diphenhydrAMINE (SOMINEX) 25 MG tablet Take 50 mg by mouth at bedtime as needed for sleep.    [provider]  DM-Doxylamine-Acetaminophen (VICKS NYQUIL MULTI-SYMPTOM PO) Take 1 tablet by mouth daily.    [provider]  famotidine (PEPCID) 20 MG tablet Take 1 tablet (20 mg total) by mouth 2 (two) times daily. 09/09/17   Charlesetta Shanks, MD  fenofibrate (TRICOR) 145 MG tablet  TAKE 1 TABLET(145 MG) BY MOUTH DAILY 11/07/18   [name], MD  fluticasone-salmeterol (ADVAIR [facility]) [phone] MCG/ACT inhaler Inhale 2 puffs into the lungs 2 (two) times daily. 04/14/18   [name], MD  gabapentin (NEURONTIN) 300 MG capsule TK 1 C PO TID 03/07/18   [provider]  GUAIFENESIN PO Take 400 mg by mouth 2 (two) times daily.    [provider]  ibuprofen (ADVIL,MOTRIN) 100 MG tablet Take 100 mg by mouth every 6 (six) hours as needed.    [provider]  meclizine (ANTIVERT) 25 MG tablet Take 1 tablet (25 mg total) by mouth 3 (three) times daily as needed for dizziness. Patient not taking:  Reported on 04/23/2019 04/14/18   [name], MD  methocarbamol (ROBAXIN) 750 MG tablet TK 1 T PO Q 8 H PRF SPASM 04/02/18   [provider]  Multiple Vitamin (MULTIVITAMIN) capsule Take 1 capsule by mouth daily.    [provider]  olmesartan-hydrochlorothiazide (BENICAR HCT) 20-12.5 MG tablet TAKE 1 TABLET BY MOUTH DAILY 11/07/18   [name], MD  oxymetazoline (AFRIN) 0.05 % nasal spray Place 1 spray into both nostrils Nightly.    [provider]  predniSONE (DELTASONE) 20 MG tablet Take 3 tablets (60 mg total) by mouth daily for 4 days. 05/31/19 06/04/19  Lennice Sites, DO    Family History Family History  Problem Relation Age of Onset   Breast cancer Sister    Breast cancer Paternal Grandmother    Anesthesia problems Neg Hx    Hypotension Neg Hx    Malignant hyperthermia Neg Hx    Pseudochol deficiency Neg Hx     Social History Social History   Tobacco Use   Smoking status: Never Smoker   Smokeless tobacco: Never Used  Substance Use Topics   Alcohol use: No    Alcohol/week: 0.0 standard drinks   Drug use: No     Allergies   Dilaudid [hydromorphone hcl] and Morphine and related   Review of Systems Review of Systems  Constitutional: Positive for fatigue. Negative for chills, diaphoresis and fever.  HENT: Negative for congestion and sore throat.   Respiratory: Positive for cough. Negative for chest tightness, shortness of breath, wheezing and stridor.   Cardiovascular: Negative for chest pain and palpitations.  Gastrointestinal: Positive for nausea and vomiting. Negative for abdominal pain, constipation and diarrhea.  Genitourinary: Negative for flank pain.  Musculoskeletal: Negative for back pain, neck pain and neck stiffness.  Neurological: Negative for dizziness, light-headedness, numbness and headaches.  All other systems reviewed and are negative.    Physical Exam Updated Vital Signs BP (!) 155/86    Pulse 88     Temp 98.8 F (37.1 C)    Resp 17    Ht 5\' 4"  (1.626 m)    Wt 81 kg    SpO2 100%    BMI 30.65 kg/m   Physical Exam Vitals signs and nursing note reviewed.  Constitutional:      General: She is not in acute distress.    Appearance: She is well-developed. She is not ill-appearing, toxic-appearing or diaphoretic.  HENT:     Head: Normocephalic and atraumatic.     Right Ear: External ear normal.     Left Ear: External ear normal.     Nose: Nose normal. No congestion or rhinorrhea.     Mouth/Throat:     Mouth: Mucous membranes are moist.     Pharynx: No oropharyngeal exudate.  Eyes:     Conjunctiva/sclera: Conjunctivae normal.     Pupils: Pupils are equal, round, and reactive to light.  Neck:     Musculoskeletal: Normal range of motion and neck supple. No muscular tenderness.  Cardiovascular:     Rate and Rhythm: Normal rate.     Pulses: Normal pulses.     Heart sounds: No murmur.  Pulmonary:     Effort: Pulmonary effort is normal. No respiratory distress.     Breath sounds: No stridor. Wheezing and rhonchi present. No rales.  Chest:     Chest wall: No tenderness.  Abdominal:     General: There is no distension.     Tenderness: There is no abdominal tenderness. There is no right CVA tenderness or rebound.  Musculoskeletal:        General: No tenderness.     Right lower leg: No edema.     Left lower leg: No edema.  Skin:    General: Skin is warm.     Capillary Refill: Capillary refill takes less than 2 seconds.     Findings: No erythema or rash.  Neurological:     General: No focal deficit present.     Mental Status: She is alert.     Motor: No abnormal muscle tone.     Deep Tendon Reflexes: Reflexes are normal and symmetric.  Psychiatric:        Mood and Affect: Mood normal.      ED Treatments / Results  Labs (all labs ordered are listed, but only abnormal results are displayed) Labs Reviewed  CBC - Abnormal; Notable for the following components:      Result Value     Platelets 457 (*)    All other components within normal limits  BASIC METABOLIC PANEL - Abnormal; Notable for the following components:   Potassium 3.4 (*)    Glucose, Bld 135 (*)    All other components within normal limits    EKG EKG Interpretation  Date/Time:  Monday June 01 2019 20:31:54 EDT Ventricular Rate:  82 PR Interval:    QRS Duration: 98 QT Interval:  371 QTC Calculation: 434 R Axis:   62 Text Interpretation:  Sinus rhythm When compared to prior, no significant changes seen.  No STEMI Confirmed by Antony Blackbird [phone]) on 06/01/2019 8:37:08 PM   Radiology Dg Chest 2 View  Result Date: 06/01/2019 CLINICAL DATA:  Cough EXAM: CHEST - 2 VIEW COMPARISON:  May 31, 2019 FINDINGS: The heart size is stable. There are old right-sided rib fractures. There is no pneumothorax. The patient is status post total shoulder arthroplasty on the left. There are degenerative changes of the right glenohumeral joint. IMPRESSION: No active cardiopulmonary disease. Electronically Signed   By: Constance Holster M.D.   On: 06/01/2019 21:48   Dg Chest Portable 1 View  Result Date: 05/31/2019 CLINICAL DATA:  Cough x 1 week; no fever; h/o asthma; achy; using inhaler w/o relief; mailaise; Vomiting; nausea; abdominal paincough EXAM: PORTABLE CHEST 1 VIEW COMPARISON:  Radiograph 08/23/2017 FINDINGS: Normal mediastinum and cardiac silhouette. Normal pulmonary vasculature. No evidence of effusion, infiltrate, or pneumothorax. No acute bony abnormality. Remote RIGHT chest wall rib fractures IMPRESSION: No acute cardiopulmonary process. Electronically Signed   By: Suzy Bouchard M.D.   On: 05/31/2019 12:05    Procedures Procedures (including critical care time)  Medications Ordered in ED Medications  ondansetron (ZOFRAN) injection 4 mg (4 mg Intravenous Given 06/01/19 2036)  sodium chloride 0.9 % bolus 1,000 mL (  0 mLs Intravenous Stopped 06/02/19 0015)  promethazine (PHENERGAN) injection 12.5 mg  (12.5 mg Intravenous Given 06/01/19 2155)     Initial Impression / Assessment and Plan / ED Course  I have reviewed the triage vital signs and the nursing notes.  Pertinent labs & imaging results that were available during my care of the patient were reviewed by me and considered in my medical decision making (see chart for details).        LASHONE STAUBER is a 73 y.o. female with a past medical history significant for hypertension, hyperlipidemia, GERD, traumatic pneumothorax, and recent diagnosis of URI and UTI who presents with nausea, vomiting, and continued cough.  Patient reports that she was seen yesterday and was informed that she has a urinary tract infection that may be contributing to nausea and vomiting.  She did not have a pneumonia on exam and imaging yesterday and was checked for coronavirus.  She was COVID negative today but is now having continued nausea and vomiting and cannot tolerate taking the antibiotics for the UTI or maintaining hydration.  She presents for reevaluation.  On exam, abdomen is nontender.  Lungs are slightly coarse as well as had faint wheezing.  Chest and abdomen nontender.  Patient moving all extremities.  Patient resting comfortably on room air.  Patient and I had a shared decision-making conversation about work-up and we agreed to repeat some blood work and a repeat chest x-ray to look for pneumonia development.  Patient did not want any albuterol as she reports it makes her "crazy" and she wants to see a pulmonologist before making changes with this.  Patient's work-up showed no significant electrolyte imbalance and her chest x-ray shows no pneumonia.  Patient is COVID negative.  Patient was able to tolerate eating and drinking after we gave her Phenergan and fluids for rehydration.  She was feeling much better after the medicine and reports that Phenergan has been the medicine helped her in the past.  Patient was given prescription for Phenergan  suppository and will follow-up with PCP.  Patient was able to eat and drink and was safe for discharge home.  Patient discharged in good condition to continue outpatient management.        Final Clinical Impressions(s) / ED Diagnoses   Final diagnoses:  Non-intractable vomiting with nausea, unspecified vomiting type  Cough    ED Discharge Orders         Ordered    promethazine (PHENERGAN) 25 MG suppository  Every 6 hours PRN     06/01/19 2341          Clinical Impression: 1. Non-intractable vomiting with nausea, unspecified vomiting type   2. Cough     Disposition: Discharge  Condition: Good  I have discussed the results, Dx and Tx plan with the pt(& family if present). He/she/they expressed understanding and agree(s) with the plan. Discharge instructions discussed at great length. Strict return precautions discussed and pt &/or family have verbalized understanding of the instructions. No further questions at time of discharge.    Discharge Medication List as of 06/01/2019 11:42 PM    START taking these medications   Details  promethazine (PHENERGAN) 25 MG suppository Place 1 suppository (25 mg total) rectally every 6 (six) hours as needed for nausea or vomiting., Starting Mon 06/01/2019, Print        Follow Up: [name], MD [place] [address] [phone]     [facility] [address] [id number] mc [place] [facility] [address] [place] [address] [phone]       Keagon Glascoe, Gwenyth Allegra, MD 06/02/19 [phone]

## 2019-06-01 NOTE — ED Triage Notes (Addendum)
Pt c/o nausea todau , seen here yesterday for URI and UTI,

## 2019-06-01 NOTE — Discharge Instructions (Signed)
Your work-up today showed no evidence of pneumonia.

## 2019-06-01 NOTE — ED Notes (Signed)
Pt reports coughing so bad that she throws up. Pt unable to eat or drink. Pt denies abdominal pain. Pt reports normal BM. Pt denies pain.

## 2019-06-01 NOTE — ED Notes (Signed)
ED Provider at bedside. 

## 2019-06-08 ENCOUNTER — Telehealth: Payer: Self-pay | Admitting: Family Medicine

## 2019-06-08 NOTE — Telephone Encounter (Signed)
Pt scheduled cpe for 9/16 Requested refill for fenofibrate And bp meds

## 2019-06-09 ENCOUNTER — Other Ambulatory Visit: Payer: Self-pay | Admitting: Emergency Medicine

## 2019-06-09 DIAGNOSIS — E785 Hyperlipidemia, unspecified: Secondary | ICD-10-CM

## 2019-06-09 MED ORDER — FENOFIBRATE 145 MG PO TABS: ORAL_TABLET | ORAL | 1 refills | Status: DC

## 2019-06-09 NOTE — Telephone Encounter (Signed)
Courtesy refill has been sent in for fenofibrate 30 tab -0rf till appt. Can refill fill for longer at office visit

## 2019-06-12 ENCOUNTER — Other Ambulatory Visit: Payer: Self-pay | Admitting: Family Medicine

## 2019-06-12 DIAGNOSIS — R062 Wheezing: Secondary | ICD-10-CM

## 2019-06-12 DIAGNOSIS — I10 Essential (primary) hypertension: Secondary | ICD-10-CM

## 2019-06-12 NOTE — Telephone Encounter (Signed)
Forwarding medication refills request to the clinical pool for review.

## 2019-06-15 NOTE — Telephone Encounter (Signed)
Patient calling to check the status of this request. She is currently out of both.

## 2019-06-18 DIAGNOSIS — M47816 Spondylosis without myelopathy or radiculopathy, lumbar region: Secondary | ICD-10-CM | POA: Diagnosis not present

## 2019-07-01 ENCOUNTER — Other Ambulatory Visit: Payer: Self-pay

## 2019-07-01 ENCOUNTER — Encounter: Payer: Self-pay | Admitting: Family Medicine

## 2019-07-01 ENCOUNTER — Ambulatory Visit (INDEPENDENT_AMBULATORY_CARE_PROVIDER_SITE_OTHER): Payer: Medicare Other | Admitting: Family Medicine

## 2019-07-01 VITALS — BP 126/74 | HR 91 | Temp 98.4°F | Resp 14 | Wt 165.6 lb

## 2019-07-01 DIAGNOSIS — E785 Hyperlipidemia, unspecified: Secondary | ICD-10-CM | POA: Diagnosis not present

## 2019-07-01 DIAGNOSIS — R739 Hyperglycemia, unspecified: Secondary | ICD-10-CM

## 2019-07-01 DIAGNOSIS — R062 Wheezing: Secondary | ICD-10-CM

## 2019-07-01 DIAGNOSIS — M5441 Lumbago with sciatica, right side: Secondary | ICD-10-CM

## 2019-07-01 DIAGNOSIS — J4520 Mild intermittent asthma, uncomplicated: Secondary | ICD-10-CM

## 2019-07-01 DIAGNOSIS — Z23 Encounter for immunization: Secondary | ICD-10-CM

## 2019-07-01 DIAGNOSIS — I10 Essential (primary) hypertension: Secondary | ICD-10-CM | POA: Diagnosis not present

## 2019-07-01 MED ORDER — SHINGRIX 50 MCG/0.5ML IM SUSR: 0.5000 mL | Freq: Once | INTRAMUSCULAR | 1 refills | Status: AC

## 2019-07-01 MED ORDER — ALBUTEROL SULFATE HFA 108 (90 BASE) MCG/ACT IN AERS: 2.0000 | INHALATION_SPRAY | Freq: Four times a day (QID) | RESPIRATORY_TRACT | 0 refills | Status: DC | PRN

## 2019-07-01 MED ORDER — FENOFIBRATE 145 MG PO TABS: ORAL_TABLET | ORAL | 5 refills | Status: DC

## 2019-07-01 MED ORDER — GABAPENTIN 300 MG PO CAPS: ORAL_CAPSULE | ORAL | 1 refills | Status: DC

## 2019-07-01 MED ORDER — ATORVASTATIN CALCIUM 20 MG PO TABS: 20.0000 mg | ORAL_TABLET | Freq: Every day | ORAL | 5 refills | Status: DC

## 2019-07-01 MED ORDER — ADVAIR HFA 115-21 MCG/ACT IN AERO: 2.0000 | INHALATION_SPRAY | Freq: Two times a day (BID) | RESPIRATORY_TRACT | 5 refills | Status: DC

## 2019-07-01 NOTE — Progress Notes (Signed)
Subjective:    Patient ID: [name], female    DOB: 1946-06-25, 73 y.o.   MRN: [id number]  HPI [name] is a 73 y.o. female Presents today for: Chief Complaint  Patient presents with  . Annual Exam    here today for her annual physical. Would like a refill on gabapentin for neuropathy pain with back doctor at the [place] [facility]. But do not see Spine doc anymore   Presents for annual exam/med review.  Medicare wellness exam July 9.  Flu vaccine, colonoscopy, pneumonia vaccine today.  Mammogram due in November. Plans to call gastroenterologist for colonoscopy, she will schedule mammogram.  Plans on flu shoat at [facility], and has pneumonia vaccine done at pharmacy - will bring record.  Immunization History  Administered Date(s) Administered  . Influenza, High Dose Seasonal PF 1946/08/08  . Influenza-Unspecified 08/15/2016, 08/17/2017  . Tdap 02/02/2014  Shingles vaccine: has not had - requests.    Hyperlipidemia:  Lab Results  Component Value Date   CHOL 175 01/23/2019   HDL 61 01/23/2019   LDLCALC 89 01/23/2019   TRIG 123 01/23/2019   CHOLHDL 2.9 01/23/2019   Lab Results  Component Value Date   ALT 14 05/31/2019   AST 19 05/31/2019   ALKPHOS 61 05/31/2019   BILITOT 0.7 05/31/2019  Lipitor 20 mg daily, TriCor 145 mg daily.  No new myalgias or side effects.   Hypertension: BP Readings from Last 3 Encounters:  07/01/19 126/74  06/01/19 (!) 155/87  05/31/19 130/60   Lab Results  Component Value Date   CREATININE 0.81 06/01/2019  On losartan HCT 20/12.5 mg daily. Home readings - none.  No new side effects with meds.   Hyperglycemia/prediabetes Lab Results  Component Value Date   HGBA1C 5.8 (H) 01/23/2019   Wt Readings from Last 3 Encounters:  07/01/19 165 lb 9.6 oz (75.1 kg)  06/01/19 178 lb 9.2 oz (81 kg)  05/31/19 180 lb (81.6 kg)  has been walking some for exercise. Weight has been improving. Appetite has decreased since illness  8/17. No vomiting.  No fever, no night sweats.   Asthma: Using Advair 115/21 mcg 2 puffs twice daily, albuterol as needed. Using albuterol every few months. Controlled.    Lumbar spondylosis, low back pain. Treated by Dr. Robinette Haines. status post 3  injections/treatment with Dr. Mina Marble for lumbar radiculopathy. Planning on nerve block next few weeks. Has taken gabapentin 300 mg  About 2 times daiily - pain radiating to knee improves on that med - not refilled by ortho/spine.    Patient Active Problem List   Diagnosis Date Noted  . Essential hypertension 08/19/2017  . Hyperlipidemia 08/19/2017  . Cough variant asthma 04/04/2016  . Upper airway cough syndrome 04/04/2016  . 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 Arthritis   . GERD (gastroesophageal reflux disease)    at times  . Hyperlipidemia   . Hypertension   . Peripheral vascular disease ([place])    has spider veins  . Pneumothorax, traumatic    RIGHT; associated with multiple rib fractures after a fall   Past Surgical History:  Procedure Laterality Date  . ABDOMINAL HYSTERECTOMY  1982  . AUGMENTATION MAMMAPLASTY Bilateral    Patient has bilateral implants   . 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 BREAST SURGERY    . JOINT REPLACEMENT  2009   right knee  . JOINT REPLACEMENT  2011   left knee  . MASTECTOMY  2008   right breast  . REDUCTION MAMMAPLASTY Left    Patient had lift with implant on Left side   . SHOULDER ARTHROSCOPY DISTAL CLAVICLE EXCISION AND OPEN ROTATOR CUFF REPAIR  2011  . TOTAL SHOULDER ARTHROPLASTY  03/14/2012   Procedure: TOTAL SHOULDER ARTHROPLASTY;  Surgeon: Alta Corning, MD;  Location: [facility];  Service: Orthopedics;  Laterality: Left;   Allergies  Allergen Reactions  . Dilaudid [Hydromorphone Hcl] Itching  . Morphine And Related Nausea And Vomiting   Prior to Admission medications   Medication Sig Start Date End Date Taking?  Authorizing Provider  acetaminophen (TYLENOL) 325 MG tablet Take 650 mg by mouth every 6 (six) hours as needed.   Yes [provider]  ADVAIR HFA 115-21 MCG/ACT inhaler INHALE 2 PUFFS INTO THE LUNGS TWICE DAILY 06/15/19  Yes [name], MD  albuterol [facility] HFA) 108 (90 Base) MCG/ACT inhaler Inhale 2 puffs into the lungs every 6 (six) hours as needed for wheezing or shortness of breath. 04/14/18  Yes [name], MD  atorvastatin (LIPITOR) 20 MG tablet Take 1 tablet (20 mg total) by mouth daily at 6 PM. 01/16/19  Yes [name], MD  Dextromethorphan-Guaifenesin 20-400 MG TABS Take by mouth daily.   Yes [provider]  diphenhydrAMINE (SOMINEX) 25 MG tablet Take 50 mg by mouth at bedtime as needed for sleep.   Yes [provider]  DM-Doxylamine-Acetaminophen (VICKS NYQUIL MULTI-SYMPTOM PO) Take 1 tablet by mouth daily.   Yes [provider]  famotidine (PEPCID) 20 MG tablet Take 1 tablet (20 mg total) by mouth 2 (two) times daily. 09/09/17  Yes Charlesetta Shanks, MD  fenofibrate (TRICOR) 145 MG tablet TAKE 1 TABLET(145 MG) BY MOUTH DAILY 06/09/19  Yes [name], MD  GUAIFENESIN PO Take 400 mg by mouth 2 (two) times daily.   Yes [provider]  ibuprofen (ADVIL,MOTRIN) 100 MG tablet Take 100 mg by mouth every 6 (six) hours as needed.   Yes [provider]  meclizine (ANTIVERT) 25 MG tablet Take 1 tablet (25 mg total) by mouth 3 (three) times daily as needed for dizziness. 04/14/18  Yes [name], MD  Multiple Vitamin (MULTIVITAMIN) capsule Take 1 capsule by mouth daily.   Yes [provider]  olmesartan-hydrochlorothiazide (BENICAR HCT) 20-12.5 MG tablet TAKE 1 TABLET BY MOUTH DAILY 06/15/19  Yes [name], MD  oxymetazoline (AFRIN) 0.05 % nasal spray Place 1 spray into both nostrils Nightly.   Yes [provider]  gabapentin (NEURONTIN) 300 MG capsule TK 1 C PO TID 03/07/18   [provider]   Social History   Socioeconomic History  . Marital status: Divorced    Spouse name: Not on file  . Number of children: 2  . Years of education: Not on file  . Highest education level: Not on file  Occupational History  . Occupation: Retired  . Occupation: Glass blower/designer    Comment: Arts development officer  Social Needs  . Financial resource strain: Not on file  . Food insecurity    Worry: Not on file    Inability: Not on file  . Transportation needs    Medical: Not on file    Non-medical: Not on file  Tobacco Use  . Smoking status: Never Smoker  . Smokeless tobacco: Never Used  Substance and Sexual Activity  . Alcohol use: No    Alcohol/week: 0.0 standard  drinks  . Drug use: No  . Sexual activity: Not on file  Lifestyle  . Physical activity    Days per week: Not on file    Minutes per session: Not on file  . Stress: Not on file  Relationships  . Social Herbalist on phone: Not on file    Gets together: Not on file    Attends religious service: Not on file    Active member of club or organization: Not on file    Attends meetings of clubs or organizations: Not on file    Relationship status: Not on file  . Intimate partner violence    Fear of current or ex partner: Not on file    Emotionally abused: Not on file    Physically abused: Not on file    Forced sexual activity: Not on file  Other Topics Concern  . Not on file  Social History Narrative  . Not on file    Review of Systems  Constitutional: Positive for appetite change. Negative for chills.   13 point review of systems per patient health survey noted.  Negative other than as indicated above or in HPI.      Objective:   Physical Exam Vitals signs reviewed.  Constitutional:      Appearance: She is well-developed.  HENT:     Head: Normocephalic and atraumatic.  Eyes:     Conjunctiva/sclera: Conjunctivae normal.     Pupils: Pupils are equal, round, and reactive to light.   Neck:     Vascular: No carotid bruit.  Cardiovascular:     Rate and Rhythm: Normal rate and regular rhythm.     Heart sounds: Normal heart sounds.  Pulmonary:     Effort: Pulmonary effort is normal.     Breath sounds: Normal breath sounds.  Abdominal:     Palpations: Abdomen is soft. There is no pulsatile mass.     Tenderness: There is no abdominal tenderness.  Skin:    General: Skin is warm and dry.  Neurological:     Mental Status: She is alert and oriented to person, place, and time.  Psychiatric:        Behavior: Behavior normal.    Vitals:   07/01/19 0926  BP: 126/74  Pulse: 91  Resp: 14  Temp: 98.4 F (36.9 C)  TempSrc: Oral  SpO2: 97%  Weight: 165 lb 9.6 oz (75.1 kg)       Assessment & Plan:   [name] is a 73 y.o. female Essential hypertension - Plan: Comprehensive metabolic panel  -  Stable, tolerating current regimen. Medications refilled recently. Labs pending as above.   Need for shingles vaccine - Plan: Zoster Vaccine Adjuvanted [facility]) injection sent to pharmacy.   Hyperglycemia - Plan: Hemoglobin A1c  -Check A1c.  Decreased appetite since recent illness, plan to follow-up in the next month for weight check with RTC precautions if worsening sooner.  Mild intermittent asthma without complication - Plan: fluticasone-salmeterol (ADVAIR HFA) 115-21 MCG/ACT inhaler, albuterol (PROAIR HFA) 108 (90 Base) MCG/ACT inhaler Wheezing - Plan: fluticasone-salmeterol (ADVAIR HFA) 115-21 MCG/ACT inhaler   -Stable, continue Advair, albuterol refilled if needed.  Right-sided low back pain with right-sided sciatica, unspecified chronicity - Plan: gabapentin (NEURONTIN) 300 MG capsule  -Refilled gabapentin for now, may have decreased need once she has other procedure with specialist.  Hyperlipidemia, unspecified hyperlipidemia type - Plan: Comprehensive metabolic panel, Lipid Panel, fenofibrate (TRICOR) 145 MG tablet, atorvastatin (LIPITOR) 20 MG tablet   -  Tolerating combination without new myalgias.  Continue same.  Meds ordered this encounter  Medications  . Zoster Vaccine Adjuvanted [facility]) injection    Sig: Inject 0.5 mLs into the muscle once for 1 dose. Repeat in 2-6 months.    Dispense:  0.5 mL    Refill:  1  . gabapentin (NEURONTIN) 300 MG capsule    Sig: 1 po BID prn.    Dispense:  60 capsule    Refill:  1  . fluticasone-salmeterol (ADVAIR HFA) 115-21 MCG/ACT inhaler    Sig: Inhale 2 puffs into the lungs 2 (two) times daily.    Dispense:  12 g    Refill:  5  . fenofibrate (TRICOR) 145 MG tablet    Sig: TAKE 1 TABLET(145 MG) BY MOUTH DAILY    Dispense:  30 tablet    Refill:  5  . albuterol (PROAIR HFA) 108 (90 Base) MCG/ACT inhaler    Sig: Inhale 2 puffs into the lungs every 6 (six) hours as needed for wheezing or shortness of breath.    Dispense:  18 g    Refill:  0  . atorvastatin (LIPITOR) 20 MG tablet    Sig: Take 1 tablet (20 mg total) by mouth daily at 6 PM.    Dispense:  30 tablet    Refill:  5   Patient Instructions   I will check some labs today. recehck in 9month to discuss appetite and weight. Return to the clinic or go to the nearest emergency room if any of your symptoms worsen or new symptoms occur.  Gabapentin if needed, but upcoming procedure should help and lessen need for this med.      If you have lab work done today you will be contacted with your lab results within the next 2 weeks.  If you have not heard from [place] then please contact us. The fastest way to get your results is to register for My Chart.   IF you received an x-ray today, you will receive an invoice from [facility]. Please contact [facility] at [phone] with questions or concerns regarding your invoice.   IF you received labwork today, you will receive an invoice from [facility]. Please contact [facility] at [phone] with questions or concerns regarding your invoice.   Our billing staff will not be able to  assist you with questions regarding bills from these companies.  You will be contacted with the lab results as soon as they are available. The fastest way to get your results is to activate your My Chart account. Instructions are located on the last page of this paperwork. If you have not heard from [place] regarding the results in 2 weeks, please contact this office.       Signed,   Merri Ray, MD [facility] [place].  07/01/19 7:25 PM

## 2019-07-01 NOTE — Patient Instructions (Addendum)
I will check some labs today. recehck in 75month to discuss appetite and weight. Return to the clinic or go to the nearest emergency room if any of your symptoms worsen or new symptoms occur.  Gabapentin if needed, but upcoming procedure should help and lessen need for this med.      If you have lab work done today you will be contacted with your lab results within the next 2 weeks.  If you have not heard from [place] then please contact us. The fastest way to get your results is to register for My Chart.   IF you received an x-ray today, you will receive an invoice from [facility]. Please contact [facility] at [phone] with questions or concerns regarding your invoice.   IF you received labwork today, you will receive an invoice from [facility]. Please contact [facility] at [phone] with questions or concerns regarding your invoice.   Our billing staff will not be able to assist you with questions regarding bills from these companies.  You will be contacted with the lab results as soon as they are available. The fastest way to get your results is to activate your My Chart account. Instructions are located on the last page of this paperwork. If you have not heard from [place] regarding the results in 2 weeks, please contact this office.

## 2019-07-02 LAB — COMPREHENSIVE METABOLIC PANEL
ALT: 15 IU/L (ref 0–32)
AST: 18 IU/L (ref 0–40)
Albumin/Globulin Ratio: 2.4 — ABNORMAL HIGH (ref 1.2–2.2)
Albumin: 4.8 g/dL — ABNORMAL HIGH (ref 3.7–4.7)
Alkaline Phosphatase: 65 IU/L (ref 39–117)
BUN/Creatinine Ratio: 21 (ref 12–28)
BUN: 17 mg/dL (ref 8–27)
Bilirubin Total: 0.5 mg/dL (ref 0.0–1.2)
CO2: 23 mmol/L (ref 20–29)
Calcium: 10.2 mg/dL (ref 8.7–10.3)
Chloride: 102 mmol/L (ref 96–106)
Creatinine, Ser: 0.82 mg/dL (ref 0.57–1.00)
GFR calc Af Amer: 82 mL/min/{1.73_m2} (ref >59)
GFR calc non Af Amer: 71 mL/min/{1.73_m2} (ref >59)
Globulin, Total: 2 g/dL (ref 1.5–4.5)
Glucose: 135 mg/dL — ABNORMAL HIGH (ref 65–99)
Potassium: 3.7 mmol/L (ref 3.5–5.2)
Sodium: 140 mmol/L (ref 134–144)
Total Protein: 6.8 g/dL (ref 6.0–8.5)

## 2019-07-02 LAB — HEMOGLOBIN A1C
Est. average glucose Bld gHb Est-mCnc: 120 mg/dL
Hgb A1c MFr Bld: 5.8 % — ABNORMAL HIGH (ref 4.8–5.6)

## 2019-07-02 LAB — LIPID PANEL
Chol/HDL Ratio: 2.8 ratio (ref 0.0–4.4)
Cholesterol, Total: 165 mg/dL (ref 100–199)
HDL: 58 mg/dL (ref >39)
LDL Chol Calc (NIH): 82 mg/dL (ref 0–99)
Triglycerides: 148 mg/dL (ref 0–149)
VLDL Cholesterol Cal: 25 mg/dL (ref 5–40)

## 2019-07-07 ENCOUNTER — Encounter: Payer: Self-pay | Admitting: Radiology

## 2019-07-14 DIAGNOSIS — M47816 Spondylosis without myelopathy or radiculopathy, lumbar region: Secondary | ICD-10-CM | POA: Diagnosis not present

## 2019-07-15 ENCOUNTER — Encounter: Payer: Self-pay | Admitting: Critical Care Medicine

## 2019-07-15 ENCOUNTER — Other Ambulatory Visit: Payer: Self-pay

## 2019-07-15 ENCOUNTER — Ambulatory Visit (INDEPENDENT_AMBULATORY_CARE_PROVIDER_SITE_OTHER): Payer: Medicare Other | Admitting: Critical Care Medicine

## 2019-07-15 VITALS — BP 120/70 | HR 80 | Temp 98.2°F | Ht 64.0 in | Wt 170.4 lb

## 2019-07-15 DIAGNOSIS — J9809 Other diseases of bronchus, not elsewhere classified: Secondary | ICD-10-CM

## 2019-07-15 DIAGNOSIS — J410 Simple chronic bronchitis: Secondary | ICD-10-CM | POA: Diagnosis not present

## 2019-07-15 DIAGNOSIS — G4730 Sleep apnea, unspecified: Secondary | ICD-10-CM | POA: Diagnosis not present

## 2019-07-15 DIAGNOSIS — J4530 Mild persistent asthma, uncomplicated: Secondary | ICD-10-CM

## 2019-07-15 DIAGNOSIS — Z579 Occupational exposure to unspecified risk factor: Secondary | ICD-10-CM

## 2019-07-15 DIAGNOSIS — Z23 Encounter for immunization: Secondary | ICD-10-CM | POA: Diagnosis not present

## 2019-07-15 MED ORDER — FLUTTER DEVI: 1.0000 | Freq: Two times a day (BID) | 0 refills | Status: DC

## 2019-07-15 NOTE — Patient Instructions (Addendum)
Thank you for visiting Dr. Carlis Abbott at [facility]. We recommend the following: Orders Placed This Encounter  Procedures  . Home sleep test   Orders Placed This Encounter  Procedures  . Home sleep test    Standing Status:   Future    Standing Expiration Date:   07/14/2020    Order Specific Question:   Where should this test be performed:    Answer:   [facility] - Pul[facility]    Meds ordered this encounter  Medications  . Respiratory Therapy Supplies (FLUTTER) DEVI    Sig: 1 each by Does not apply route 2 (two) times daily.    Dispense:  1 each    Refill:  0    Return in about 3 months (around 10/14/2019).    Please do your part to reduce the spread of COVID-19.

## 2019-07-15 NOTE — Progress Notes (Signed)
   Subjective:    Patient ID: [name], female    DOB: Oct 25, 1945, 73 y.o.   MRN: [id number]  HPI    Review of Systems  Constitutional: Negative for fever and unexpected weight change.  HENT: Negative for congestion, dental problem, ear pain, nosebleeds, postnasal drip, rhinorrhea, sinus pressure, sneezing, sore throat and trouble swallowing.   Eyes: Negative for redness and itching.  Respiratory: Positive for shortness of breath. Negative for cough, chest tightness and wheezing.   Cardiovascular: Negative for palpitations and leg swelling.  Gastrointestinal: Negative for nausea and vomiting.  Genitourinary: Negative for dysuria.  Musculoskeletal: Negative for joint swelling.  Skin: Negative for rash.  Allergic/Immunologic: Negative.  Negative for environmental allergies, food allergies and immunocompromised state.  Neurological: Negative for headaches.  Hematological: Does not bruise/bleed easily.  Psychiatric/Behavioral: Negative for dysphoric mood. The patient is not nervous/anxious.        Objective:   Physical Exam        Assessment & Plan:

## 2019-07-15 NOTE — Progress Notes (Signed)
Synopsis: Referred in June 2017 for bronchitis by [name], MD.  Previously patient of Dr. Melvyn Novas.  Subjective:   PATIENT ID: [name] GENDER: female DOB: [birth year], MRN: [id number]  Chief Complaint  Patient presents with  . Consult    Self referral for SOB. Per patient, her son is concerned about her continued SOB. Worked for years in [place] that contained chemicals. Notices the SOB during exertion and being around strong fumes.     [name] is a 73 year old woman who presents for evaluation of asthma.  She was diagnosed with asthma by her primary care provider after retirement in 2007.  She formerly worked at [employer] in [place].  She was not around grains, but was around the beers that was being..  She did not wear a respirator at work.  She was exposed to many chemicals, but she is unsure what they were.  Multiple of her former colleagues have also developed asthma or other respiratory issues.  She does not have childhood asthma and there is no family history of lung disease.  She has no history of immunosuppression, immune deficiency, and the only family history of rheumatologic diseases her sister with rheumatoid arthritis.  She has been maintained on Advair twice daily for several years, which helps her symptoms.  She uses albuterol infrequently, less than once per month.  Her main symptoms are dyspnea on exertion, but she can walk 20 to 30 minutes before having to stop to rest.  She does not cough regularly, but expectorates gray, thick sputum several times throughout the day.  Mucinex helps with this.  No hemoptysis.  She only has wheezing when she has an infection.. She had pneumonia once as a child and a few times as an adult.  In 2005 she had pneumothorax after falling and breaking several ribs on the right side; she was in the hospital for a week with a chest tube.  Whenever she gets a respiratory infection it moved into her chest quickly, and she requires  antibiotics.  Overall she is healthy and does not feel that her activity is significantly limited.  Her son is concerned by her symptoms, and has recommended that she be seen by pulmonology he is a never smoker/vaper.  She has no history of environmental allergies.  She has well-controlled GERD.  Her son is concerned that he has seen her when she is sleeping appear to stop breathing.  She denies morning headaches or daytime fatigue.  ESS = 0.  She has never been evaluated for sleep apnea.    Flowsheet data:  Lab Results  Component Value Date   NITRICOXIDE 14 04/04/2016    Past Medical History:  Diagnosis Date  . Arthritis   . GERD (gastroesophageal reflux disease)    at times  . Hyperlipidemia   . Hypertension   . Peripheral vascular disease ([place])    has spider veins  . Pneumothorax, traumatic    RIGHT; associated with multiple rib fractures after a fall     Family History  Problem Relation Age of Onset  . Breast cancer Sister   . Breast cancer Paternal Grandmother   . Anesthesia problems Neg Hx   . Hypotension Neg Hx   . Malignant hyperthermia Neg Hx   . Pseudochol deficiency Neg Hx      Past Surgical History:  Procedure Laterality Date  . ABDOMINAL HYSTERECTOMY  1982  . AUGMENTATION MAMMAPLASTY Bilateral    Patient has bilateral implants   .  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 BREAST SURGERY    . JOINT REPLACEMENT  2009   right knee  . JOINT REPLACEMENT  2011   left knee  . MASTECTOMY  2008   right breast  . REDUCTION MAMMAPLASTY Left    Patient had lift with implant on Left side   . SHOULDER ARTHROSCOPY DISTAL CLAVICLE EXCISION AND OPEN ROTATOR CUFF REPAIR  2011  . TOTAL SHOULDER ARTHROPLASTY  03/14/2012   Procedure: TOTAL SHOULDER ARTHROPLASTY;  Surgeon: Alta Corning, MD;  Location: [facility];  Service: Orthopedics;  Laterality: Left;    Social History   Socioeconomic History  . Marital status: Divorced    Spouse name: Not on file  . Number of  children: 2  . Years of education: Not on file  . Highest education level: Not on file  Occupational History  . Occupation: Retired  . Occupation: Glass blower/designer    Comment: Arts development officer  Social Needs  . Financial resource strain: Not on file  . Food insecurity    Worry: Not on file    Inability: Not on file  . Transportation needs    Medical: Not on file    Non-medical: Not on file  Tobacco Use  . Smoking status: Never Smoker  . Smokeless tobacco: Never Used  Substance and Sexual Activity  . Alcohol use: No    Alcohol/week: 0.0 standard drinks  . Drug use: No  . Sexual activity: Not on file  Lifestyle  . Physical activity    Days per week: Not on file    Minutes per session: Not on file  . Stress: Not on file  Relationships  . Social Herbalist on phone: Not on file    Gets together: Not on file    Attends religious service: Not on file    Active member of club or organization: Not on file    Attends meetings of clubs or organizations: Not on file    Relationship status: Not on file  . Intimate partner violence    Fear of current or ex partner: Not on file    Emotionally abused: Not on file    Physically abused: Not on file    Forced sexual activity: Not on file  Other Topics Concern  . Not on file  Social History Narrative  . Not on file     Allergies  Allergen Reactions  . Dilaudid [Hydromorphone Hcl] Itching  . Morphine And Related Nausea And Vomiting     Immunization History  Administered Date(s) Administered  . Influenza, High Dose Seasonal PF 05[birth year]02/45  . Influenza-Unspecified 08/15/2016, 08/17/2017  . Tdap 02/02/2014    Outpatient Medications Prior to Visit  Medication Sig Dispense Refill  . acetaminophen (TYLENOL) 325 MG tablet Take 650 mg by mouth every 6 (six) hours as needed.    Marland Kitchen albuterol (PROAIR HFA) 108 (90 Base) MCG/ACT inhaler Inhale 2 puffs into the lungs every 6 (six) hours as needed for wheezing or shortness of  breath. 18 g 0  . atorvastatin (LIPITOR) 20 MG tablet Take 1 tablet (20 mg total) by mouth daily at 6 PM. 30 tablet 5  . Dextromethorphan-Guaifenesin 20-400 MG TABS Take by mouth daily.    . diphenhydrAMINE (SOMINEX) 25 MG tablet Take 50 mg by mouth at bedtime as needed for sleep.    Marland Kitchen DM-Doxylamine-Acetaminophen (VICKS NYQUIL MULTI-SYMPTOM PO) Take 1 tablet by mouth daily.    . famotidine (PEPCID)  20 MG tablet Take 1 tablet (20 mg total) by mouth 2 (two) times daily. 30 tablet 0  . fenofibrate (TRICOR) 145 MG tablet TAKE 1 TABLET(145 MG) BY MOUTH DAILY 30 tablet 5  . fluticasone-salmeterol (ADVAIR HFA) 115-21 MCG/ACT inhaler Inhale 2 puffs into the lungs 2 (two) times daily. 12 g 5  . gabapentin (NEURONTIN) 300 MG capsule 1 po BID prn. 60 capsule 1  . GUAIFENESIN PO Take 400 mg by mouth 2 (two) times daily.    Marland Kitchen ibuprofen (ADVIL,MOTRIN) 100 MG tablet Take 100 mg by mouth every 6 (six) hours as needed.    . meclizine (ANTIVERT) 25 MG tablet Take 1 tablet (25 mg total) by mouth 3 (three) times daily as needed for dizziness. 30 tablet 0  . Multiple Vitamin (MULTIVITAMIN) capsule Take 1 capsule by mouth daily.    Marland Kitchen olmesartan-hydrochlorothiazide (BENICAR HCT) 20-12.5 MG tablet TAKE 1 TABLET BY MOUTH DAILY 30 tablet 5  . oxymetazoline (AFRIN) 0.05 % nasal spray Place 1 spray into both nostrils Nightly.     No facility-administered medications prior to visit.     Review of Systems  Constitutional: Negative for chills, fever, malaise/fatigue and weight loss.  HENT: Negative for congestion.        No postnasal drip  Eyes: Negative.   Respiratory: Positive for sputum production and shortness of breath. Negative for cough, hemoptysis and wheezing.   Cardiovascular: Negative for chest pain, palpitations and leg swelling.  Gastrointestinal: Negative for constipation, heartburn, melena, nausea and vomiting.  Genitourinary: Negative.   Musculoskeletal: Negative for myalgias.       Chronic right  shoulder pain and weakness from OA  Skin: Negative for rash.  Neurological: Negative for focal weakness and headaches.  Endo/Heme/Allergies: Negative for environmental allergies.  Psychiatric/Behavioral: Negative.      Objective:   Vitals:   07/15/19 1405  BP: 120/70  Pulse: 80  Temp: 98.2 F (36.8 C)  TempSrc: Temporal  SpO2: 98%  Weight: 170 lb 6.4 oz (77.3 kg)  Height: 5\' 4"  (1.626 m)   98% on   RA BMI Readings from Last 3 Encounters:  07/15/19 29.25 kg/m  07/01/19 28.43 kg/m  06/01/19 30.65 kg/m   Wt Readings from Last 3 Encounters:  07/15/19 170 lb 6.4 oz (77.3 kg)  07/01/19 165 lb 9.6 oz (75.1 kg)  06/01/19 178 lb 9.2 oz (81 kg)    Physical Exam Vitals signs reviewed.  Constitutional:      Appearance: Normal appearance. She is not diaphoretic.     Comments: Healthy-appearing elderly woman in no acute distress  HENT:     Head: Normocephalic and atraumatic.     Nose:     Comments: Deferred due to masking requirement.    Mouth/Throat:     Comments: Deferred due to masking requirement. Eyes:     General: No scleral icterus. Neck:     Musculoskeletal: Neck supple.  Cardiovascular:     Rate and Rhythm: Normal rate.     Heart sounds: No murmur.  Pulmonary:     Comments: Breathing comfortably on room air.  No conversational dyspnea or tachypnea.  Clear to auscultation bilaterally.  No coughing with forced exhalation. Abdominal:     General: There is no distension.     Palpations: Abdomen is soft.  Musculoskeletal:        General: No swelling or deformity.  Lymphadenopathy:     Cervical: No cervical adenopathy.  Skin:    General: Skin is warm and dry.  Findings: No rash.  Neurological:     General: No focal deficit present.     Mental Status: She is alert.     Coordination: Coordination normal.  Psychiatric:        Mood and Affect: Mood normal.        Behavior: Behavior normal.      CBC    Component Value Date/Time   WBC 9.6 06/01/2019  2031   RBC 3.95 06/01/2019 2031   HGB 12.7 06/01/2019 2031   HGB 12.7 07/06/2011 0922   HCT 37.8 06/01/2019 2031   HCT 37.4 07/06/2011 0922   PLT 457 (H) 06/01/2019 2031   PLT 237 07/06/2011 0922   MCV 95.7 06/01/2019 2031   MCV 93.8 12/31/2016 1146   MCV 92.3 07/06/2011 0922   MCH 32.2 06/01/2019 2031   MCHC 33.6 06/01/2019 2031   RDW 11.7 06/01/2019 2031   RDW 12.5 07/06/2011 0922   LYMPHSABS 1.6 05/31/2019 1124   LYMPHSABS 3.1 07/06/2011 0922   MONOABS 0.5 05/31/2019 1124   MONOABS 0.5 07/06/2011 0922   EOSABS 0.2 05/31/2019 1124   EOSABS 0.3 07/06/2011 0922   BASOSABS 0.0 05/31/2019 1124   BASOSABS 0.0 07/06/2011 0922    CHEMISTRY No results for input(s): NA, K, CL, CO2, GLUCOSE, BUN, CREATININE, CALCIUM, MG, PHOS in the last 168 hours. Estimated Creatinine Clearance: 61.4 mL/min (by C-G formula based on SCr of 0.82 mg/dL).   Chest Imaging- films reviewed: CXR 06/01/2019, 2 view- symmetric increased bibasilar opacities with no silhouetting of the hemidiaphragms.  Broken ribs laterally on the right.  Surgical clips in the right axilla.  Left shoulder prosthesis.  Pulmonary Functions Testing Results: No flowsheet data found. Spirometry 04/04/2016   FVC 2.1 (74%) FEV1 1.5 (68%) Ratio 69% FEF 25-75   45% predicted Mild obstruction     Assessment & Plan:     ICD-10-CM   1. 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 Bronchorrhea  J98.09 Respiratory Therapy Supplies (FLUTTER) DEVI  3. Simple chronic bronchitis ([place])  J41.0 Respiratory Therapy Supplies (FLUTTER) DEVI  4. Sleep apnea, unspecified type  G47.30 Home sleep test   Witnessed apneas raising concern for OSA. ESS 0. -Home sleep apnea test  Occupational asthma- stable and well-controlled. No longer working.  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  Has bronchorrhea with sputum production throughout the day. -Continue Advair twice  daily -Continue albuterol as needed -Flu shot today.  Up-to-date on pneumonia vaccines. -Maintain regular physical activity to maintain functional status -Flutter valve twice daily -Continue Mucinex. -Will defer additional workup given stability and lack of concerning B symptoms.   RTC in 3 months.  Current Outpatient Medications:  .  acetaminophen (TYLENOL) 325 MG tablet, Take 650 mg by mouth every 6 (six) hours as needed., Disp: , Rfl:  .  albuterol (PROAIR HFA) 108 (90 Base) MCG/ACT inhaler, Inhale 2 puffs into the lungs every 6 (six) hours as needed for wheezing or shortness of breath., Disp: 18 g, Rfl: 0 .  atorvastatin (LIPITOR) 20 MG tablet, Take 1 tablet (20 mg total) by mouth daily at 6 PM., Disp: 30 tablet, Rfl: 5 .  Dextromethorphan-Guaifenesin 20-400 MG TABS, Take by mouth daily., Disp: , Rfl:  .  diphenhydrAMINE (SOMINEX) 25 MG tablet, Take 50 mg by mouth at bedtime as needed for sleep., Disp: , Rfl:  .  DM-Doxylamine-Acetaminophen (VICKS NYQUIL MULTI-SYMPTOM PO), Take 1 tablet by mouth daily.,  Disp: , Rfl:  .  famotidine (PEPCID) 20 MG tablet, Take 1 tablet (20 mg total) by mouth 2 (two) times daily., Disp: 30 tablet, Rfl: 0 .  fenofibrate (TRICOR) 145 MG tablet, TAKE 1 TABLET(145 MG) BY MOUTH DAILY, Disp: 30 tablet, Rfl: 5 .  fluticasone-salmeterol (ADVAIR HFA) 115-21 MCG/ACT inhaler, Inhale 2 puffs into the lungs 2 (two) times daily., Disp: 12 g, Rfl: 5 .  gabapentin (NEURONTIN) 300 MG capsule, 1 po BID prn., Disp: 60 capsule, Rfl: 1 .  GUAIFENESIN PO, Take 400 mg by mouth 2 (two) times daily., Disp: , Rfl:  .  ibuprofen (ADVIL,MOTRIN) 100 MG tablet, Take 100 mg by mouth every 6 (six) hours as needed., Disp: , Rfl:  .  meclizine (ANTIVERT) 25 MG tablet, Take 1 tablet (25 mg total) by mouth 3 (three) times daily as needed for dizziness., Disp: 30 tablet, Rfl: 0 .  Multiple Vitamin (MULTIVITAMIN) capsule, Take 1 capsule by mouth daily., Disp: , Rfl:  .   olmesartan-hydrochlorothiazide (BENICAR HCT) 20-12.5 MG tablet, TAKE 1 TABLET BY MOUTH DAILY, Disp: 30 tablet, Rfl: 5 .  oxymetazoline (AFRIN) 0.05 % nasal spray, Place 1 spray into both nostrils Nightly., Disp: , Rfl:    Julian Hy, DO [facility] 07/15/2019 2:19 PM

## 2019-07-20 ENCOUNTER — Ambulatory Visit
Admission: RE | Admit: 2019-07-20 | Discharge: 2019-07-20 | Disposition: A | Discharge status: Home / Self Care | Payer: Medicare Other | Source: Ambulatory Visit | Attending: Family Medicine | Admitting: Family Medicine

## 2019-07-20 ENCOUNTER — Other Ambulatory Visit: Payer: Self-pay

## 2019-07-20 DIAGNOSIS — Z1231 Encounter for screening mammogram for malignant neoplasm of breast: Secondary | ICD-10-CM

## 2019-07-20 DIAGNOSIS — Z1211 Encounter for screening for malignant neoplasm of colon: Secondary | ICD-10-CM | POA: Diagnosis not present

## 2019-07-20 DIAGNOSIS — K5904 Chronic idiopathic constipation: Secondary | ICD-10-CM | POA: Diagnosis not present

## 2019-07-23 ENCOUNTER — Telehealth: Payer: Self-pay | Admitting: Critical Care Medicine

## 2019-07-23 NOTE — Telephone Encounter (Signed)
Call returned to patient, confirmed DOB, she needed to know how to use her flutter valve. I explained over the phone, I made her aware if this was not helpful she can always stop by the office and have someone show her. Voiced understanding. Nothing further needed at this time.

## 2019-07-27 DIAGNOSIS — M47816 Spondylosis without myelopathy or radiculopathy, lumbar region: Secondary | ICD-10-CM | POA: Diagnosis not present

## 2019-07-29 ENCOUNTER — Ambulatory Visit (INDEPENDENT_AMBULATORY_CARE_PROVIDER_SITE_OTHER): Payer: Medicare Other | Admitting: Family Medicine

## 2019-07-29 ENCOUNTER — Other Ambulatory Visit: Payer: Self-pay

## 2019-07-29 ENCOUNTER — Encounter: Payer: Self-pay | Admitting: Family Medicine

## 2019-07-29 VITALS — BP 132/72 | HR 87 | Temp 98.3°F | Wt 167.0 lb

## 2019-07-29 DIAGNOSIS — R630 Anorexia: Secondary | ICD-10-CM | POA: Diagnosis not present

## 2019-07-29 DIAGNOSIS — F439 Reaction to severe stress, unspecified: Secondary | ICD-10-CM

## 2019-07-29 NOTE — Patient Instructions (Addendum)
  Thanks for coming in today. Glad to hear that things are going better. Keep follow up with other specialists, but let me know if I can help further.    If you have lab work done today you will be contacted with your lab results within the next 2 weeks.  If you have not heard from [place] then please contact us. The fastest way to get your results is to register for My Chart.   IF you received an x-ray today, you will receive an invoice from [facility]. Please contact [facility] at [phone] with questions or concerns regarding your invoice.   IF you received labwork today, you will receive an invoice from [facility]. Please contact [facility] at [phone] with questions or concerns regarding your invoice.   Our billing staff will not be able to assist you with questions regarding bills from these companies.  You will be contacted with the lab results as soon as they are available. The fastest way to get your results is to activate your My Chart account. Instructions are located on the last page of this paperwork. If you have not heard from [place] regarding the results in 2 weeks, please contact this office.

## 2019-07-29 NOTE — Progress Notes (Signed)
Subjective:    Patient ID: [name], female    DOB: April 14, 1946, 73 y.o.   MRN: [id number]  HPI [name] is a 73 y.o. female Presents today for: Chief Complaint  Patient presents with  . Follow-up    1 month f/u on appetite and weight. Appetite is a little better.   Decreased appetite: Discussed at last visit September 16.  Thought to be related to recent illness.  Did have hyperglycemia, with A1c in prediabetes range. Weight 165 on September 16, 167 today. Appetite has improved, no n/v. Eating ok now, drinking fluids. Still watching diet as prediabetes. Had been under more stress last visit. Stress with grandson and finances.  Doing better now has son working with her with savings account.   Wt Readings from Last 3 Encounters:  07/29/19 167 lb (75.8 kg)  07/15/19 170 lb 6.4 oz (77.3 kg)  07/01/19 165 lb 9.6 oz (75.1 kg)    Lab Results  Component Value Date   HGBA1C 5.8 (H) 07/01/2019   HM:  Immunization History  Administered Date(s) Administered  . Fluad Quad(high Dose 65+) 07/15/2019  . Influenza, High Dose Seasonal PF 04-Feb-1946  . Influenza-Unspecified 08/15/2016, 08/17/2017, 07/15/2019  . Meningococcal Polysaccharide 07/17/2016  . Pneumococcal Conjugate-13 09/13/2014  . Tdap 02/02/2014  [facility] records reviewed - Prevnar 08/27/14, Pneumovax 07/17/16.  Colonoscopy 11/19 planned.    Patient Active Problem List   Diagnosis Date Noted  . Essential hypertension 08/19/2017  . Hyperlipidemia 08/19/2017  . Cough variant asthma 04/04/2016  . Upper airway cough syndrome 04/04/2016  . 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 Arthritis   . GERD (gastroesophageal reflux disease)    at times  . Hyperlipidemia   . Hypertension   . Peripheral vascular disease ([place])    has spider veins  . Pneumothorax, traumatic    RIGHT; associated with multiple rib fractures after a fall   Past Surgical History:   Procedure Laterality Date  . ABDOMINAL HYSTERECTOMY  1982  . AUGMENTATION MAMMAPLASTY Bilateral    Patient has bilateral implants   . 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 BREAST SURGERY    . JOINT REPLACEMENT  2009   right knee  . JOINT REPLACEMENT  2011   left knee  . MASTECTOMY  2008   right breast  . REDUCTION MAMMAPLASTY Left    Patient had lift with implant on Left side   . SHOULDER ARTHROSCOPY DISTAL CLAVICLE EXCISION AND OPEN ROTATOR CUFF REPAIR  2011  . TOTAL SHOULDER ARTHROPLASTY  03/14/2012   Procedure: TOTAL SHOULDER ARTHROPLASTY;  Surgeon: Alta Corning, MD;  Location: [facility];  Service: Orthopedics;  Laterality: Left;   Allergies  Allergen Reactions  . Dilaudid [Hydromorphone Hcl] Itching  . Morphine And Related Nausea And Vomiting   Prior to Admission medications   Medication Sig Start Date End Date Taking? Authorizing Provider  acetaminophen (TYLENOL) 325 MG tablet Take 650 mg by mouth every 6 (six) hours as needed.   Yes [provider]  albuterol (PROAIR HFA) 108 (90 Base) MCG/ACT inhaler Inhale 2 puffs into the lungs every 6 (six) hours as needed for wheezing or shortness of breath. 07/01/19  Yes [name], MD  atorvastatin (LIPITOR) 20 MG tablet Take 1 tablet (20 mg total) by mouth daily at 6 PM. 07/01/19  Yes [name], MD  Dextromethorphan-Guaifenesin 20-400 MG TABS Take by mouth daily.  Yes [provider]  diphenhydrAMINE (SOMINEX) 25 MG tablet Take 50 mg by mouth at bedtime as needed for sleep.   Yes [provider]  famotidine (PEPCID) 20 MG tablet Take 1 tablet (20 mg total) by mouth 2 (two) times daily. 09/09/17  Yes Charlesetta Shanks, MD  fenofibrate (TRICOR) 145 MG tablet TAKE 1 TABLET(145 MG) BY MOUTH DAILY 07/01/19  Yes [name], MD  fluticasone-salmeterol (ADVAIR [facility]) 115-21 MCG/ACT inhaler Inhale 2 puffs into the lungs 2 (two) times daily. 07/01/19  Yes [name], MD  gabapentin (NEURONTIN) 300  MG capsule 1 po BID prn. 07/01/19  Yes [name], MD  GUAIFENESIN PO Take 400 mg by mouth 2 (two) times daily.   Yes [provider]  ibuprofen (ADVIL,MOTRIN) 100 MG tablet Take 100 mg by mouth every 6 (six) hours as needed.   Yes [provider]  meclizine (ANTIVERT) 25 MG tablet Take 1 tablet (25 mg total) by mouth 3 (three) times daily as needed for dizziness. 04/14/18  Yes [name], MD  Multiple Vitamin (MULTIVITAMIN) capsule Take 1 capsule by mouth daily.   Yes [provider]  olmesartan-hydrochlorothiazide (BENICAR HCT) 20-12.5 MG tablet TAKE 1 TABLET BY MOUTH DAILY 06/15/19  Yes [name], MD  oxymetazoline (AFRIN) 0.05 % nasal spray Place 1 spray into both nostrils Nightly.   Yes [provider]  Respiratory Therapy Supplies (FLUTTER) DEVI 1 each by Does not apply route 2 (two) times daily. 07/15/19  Yes Julian Hy, DO   Social History   Socioeconomic History  . Marital status: Divorced    Spouse name: Not on file  . Number of children: 2  . Years of education: Not on file  . Highest education level: Not on file  Occupational History  . Occupation: Retired  . Occupation: Glass blower/designer    Comment: Arts development officer  Social Needs  . Financial resource strain: Not on file  . Food insecurity    Worry: Not on file    Inability: Not on file  . Transportation needs    Medical: Not on file    Non-medical: Not on file  Tobacco Use  . Smoking status: Never Smoker  . Smokeless tobacco: Never Used  Substance and Sexual Activity  . Alcohol use: No    Alcohol/week: 0.0 standard drinks  . Drug use: No  . Sexual activity: Not on file  Lifestyle  . Physical activity    Days per week: Not on file    Minutes per session: Not on file  . Stress: Not on file  Relationships  . Social Herbalist on phone: Not on file    Gets together: Not on file    Attends religious service: Not on file    Active member of  club or organization: Not on file    Attends meetings of clubs or organizations: Not on file    Relationship status: Not on file  . Intimate partner violence    Fear of current or ex partner: Not on file    Emotionally abused: Not on file    Physically abused: Not on file    Forced sexual activity: Not on file  Other Topics Concern  . Not on file  Social History Narrative  . Not on file    Review of Systems Per [facility].     Objective:   Physical Exam Constitutional:      General: She is not in  acute distress.    Appearance: She is well-developed.  HENT:     Head: Normocephalic and atraumatic.  Cardiovascular:     Rate and Rhythm: Normal rate.  Pulmonary:     Effort: Pulmonary effort is normal.  Abdominal:     General: Abdomen is flat.     Tenderness: There is no abdominal tenderness.  Neurological:     Mental Status: She is alert and oriented to person, place, and time.  Psychiatric:        Mood and Affect: Mood normal.        Behavior: Behavior normal.        Thought Content: Thought content normal.    Vitals:   07/29/19 0958  BP: 132/72  Pulse: 87  Temp: 98.3 F (36.8 C)  TempSrc: Oral  SpO2: 97%  Weight: 167 lb (75.8 kg)        Assessment & Plan:  [name] is a 73 y.o. female Decreased appetite  Situational stress  Appetite has improved.  Previous situational stressor also under better control.  No new meds/treatments at this time.  Continue to watch diet with healthy eating with prediabetes.  RTC precautions given.  No orders of the defined types were placed in this encounter.  Patient Instructions    Thanks for coming in today. Glad to hear that things are going better. Keep follow up with other specialists, but let me know if I can help further.    If you have lab work done today you will be contacted with your lab results within the next 2 weeks.  If you have not heard from [place] then please contact us. The fastest way to get your results is  to register for My Chart.   IF you received an x-ray today, you will receive an invoice from [facility]. Please contact [facility] at [phone] with questions or concerns regarding your invoice.   IF you received labwork today, you will receive an invoice from [place]. Please contact [facility] at [phone] with questions or concerns regarding your invoice.   Our billing staff will not be able to assist you with questions regarding bills from these companies.  You will be contacted with the lab results as soon as they are available. The fastest way to get your results is to activate your My Chart account. Instructions are located on the last page of this paperwork. If you have not heard from [place] regarding the results in 2 weeks, please contact this office.       Signed,   Merri Ray, MD [facility].  07/29/19 7:12 PM

## 2019-07-30 ENCOUNTER — Ambulatory Visit: Payer: Medicare Other

## 2019-07-30 DIAGNOSIS — G4733 Obstructive sleep apnea (adult) (pediatric): Secondary | ICD-10-CM | POA: Diagnosis not present

## 2019-07-30 DIAGNOSIS — G4730 Sleep apnea, unspecified: Secondary | ICD-10-CM

## 2019-07-31 ENCOUNTER — Telehealth: Payer: Self-pay | Admitting: Critical Care Medicine

## 2019-07-31 DIAGNOSIS — G4733 Obstructive sleep apnea (adult) (pediatric): Secondary | ICD-10-CM | POA: Diagnosis not present

## 2019-08-03 ENCOUNTER — Telehealth: Payer: Self-pay | Admitting: *Deleted

## 2019-08-03 DIAGNOSIS — G4733 Obstructive sleep apnea (adult) (pediatric): Secondary | ICD-10-CM

## 2019-08-03 NOTE — Telephone Encounter (Signed)
Spoke with patient. Advised her that her HST showed that she has OSA. She wanted to know accurate the test was since she stated every time she woke up, the device was off of her hand. She is not willing to start cpap therapy until she has had an in lab study. She has refused a f/u in office with an app.   Dr. Carlis Abbott, please advise. Thanks.

## 2019-08-03 NOTE — Telephone Encounter (Signed)
Split-night sleep study in-lab ordered.  The patient is reluctant to start CPAP or follow-up with one of the APP's in the office due to lack of trust in the home sleep study that she had performed.  Julian Hy, DO 08/03/19 5:28 PM [facility]

## 2019-08-03 NOTE — Telephone Encounter (Signed)
-----   Message from Julian Hy, DO sent at 08/03/2019  9:03 AM EDT ----- Regarding: appt with APP Lauren,  Dr. Halford Chessman recommended getting this lady in with one of the APPs to establish with her CPAP. Can we do this within 1-2 weeks please?  Thanks!  Arby Barrette

## 2019-08-03 NOTE — Telephone Encounter (Signed)
returnign call  CB# [phone]

## 2019-08-03 NOTE — Telephone Encounter (Signed)
ATC no VM setup

## 2019-08-03 NOTE — Telephone Encounter (Signed)
Lauren,  Can you let her know that her sleep study showed that she does have sleep apnea and I am going to start her on an auto-titrating CPAP. [facility] will call her to set this up. If she has any questions I am happy to answer them! Thanks!  Paige   Routing comment    ATC patient, no VM set up will try again later

## 2019-08-04 NOTE — Telephone Encounter (Signed)
Patient already scheduled for split night sleep study 08/18/19.  Will follow up after that. Nothing further needed at this time.

## 2019-08-04 NOTE — Telephone Encounter (Signed)
Pt returning call - she can be reached at [phone]

## 2019-08-04 NOTE — Telephone Encounter (Signed)
Called and spoke with Patient. Sleep results from Dr. Carlis Abbott given.  Patient stated she is unsure if she would like to go further with cpap.  Patient is concerned results may be incorrect due to piece on her finger kept coming off during sleep study.  Message routed to [place], RN   Results per Dr. Carlis Abbott- Can you let her know that her sleep study showed that she does have sleep apnea and I am going to start her on an auto-titrating CPAP. [facility] will call her to set this up. If she has any questions I am happy to answer them! Thanks!  Arby Barrette

## 2019-08-15 ENCOUNTER — Other Ambulatory Visit (HOSPITAL_COMMUNITY): Payer: Medicare Other

## 2019-08-18 ENCOUNTER — Encounter (HOSPITAL_BASED_OUTPATIENT_CLINIC_OR_DEPARTMENT_OTHER): Payer: Medicare Other | Admitting: Pulmonary Disease

## 2019-08-18 DIAGNOSIS — M47816 Spondylosis without myelopathy or radiculopathy, lumbar region: Secondary | ICD-10-CM | POA: Diagnosis not present

## 2019-08-26 ENCOUNTER — Other Ambulatory Visit (HOSPITAL_COMMUNITY): Payer: Medicare Other

## 2019-08-29 ENCOUNTER — Encounter (HOSPITAL_BASED_OUTPATIENT_CLINIC_OR_DEPARTMENT_OTHER): Payer: Medicare Other | Admitting: Pulmonary Disease

## 2019-09-07 ENCOUNTER — Other Ambulatory Visit: Payer: Self-pay | Admitting: Family Medicine

## 2019-09-07 DIAGNOSIS — H8110 Benign paroxysmal vertigo, unspecified ear: Secondary | ICD-10-CM

## 2019-09-07 NOTE — Telephone Encounter (Signed)
Requested medication (s) are due for refill today: yes  Requested medication (s) are on the active medication list:yes  Last refill:  04/14/2018  Future visit scheduled: no  Notes to clinic:  Patient has one pill left Review for refill   Requested Prescriptions  Pending Prescriptions Disp Refills   meclizine (ANTIVERT) 25 MG tablet 30 tablet 0    Sig: Take 1 tablet (25 mg total) by mouth 3 (three) times daily as needed for dizziness.     Not Delegated - Gastroenterology: Antiemetics Failed - 09/07/2019 10:36 AM      Failed - This refill cannot be delegated      Passed - Valid encounter within last 6 months    Recent Outpatient Visits          1 month ago Decreased appetite   [facility], MD   2 months ago Essential hypertension   [facility], Ranell Patrick, MD   4 months ago Medicare annual wellness visit, subsequent   [facility], Ranell Patrick, MD   7 months ago Hyperglycemia   [facility], Ranell Patrick, MD   1 year ago Wheezing   [facility], Ranell Patrick, MD      Future Appointments            In 1 month Carlis Abbott Venita Sheffield, DO [facility]

## 2019-09-07 NOTE — Telephone Encounter (Signed)
Medication Refill - Medication: meclizine (ANTIVERT) 25 MG tablet    Has the patient contacted their pharmacy? Yes.   Pt called stating she only has one pill left. Please advise.  (Agent: If no, request that the patient contact the pharmacy for the refill.) (Agent: If yes, when and what did the pharmacy advise?)  Preferred Pharmacy (with phone number or street name):  [facility] RE:[id number] [address] [place], [place]  [address] [place] [address]  Phone: [phone] Fax: [phone]  Not a 24 hour pharmacy; exact hours not known.     Agent: Please be advised that RX refills may take up to 3 business days. We ask that you follow-up with your pharmacy.

## 2019-09-15 ENCOUNTER — Other Ambulatory Visit (HOSPITAL_COMMUNITY)
Admission: RE | Admit: 2019-09-15 | Discharge: 2019-09-15 | Disposition: A | Discharge status: Home / Self Care | Payer: Medicare Other | Source: Ambulatory Visit | Attending: Pulmonary Disease | Admitting: Pulmonary Disease

## 2019-09-15 DIAGNOSIS — Z20828 Contact with and (suspected) exposure to other viral communicable diseases: Secondary | ICD-10-CM | POA: Insufficient documentation

## 2019-09-15 DIAGNOSIS — M47816 Spondylosis without myelopathy or radiculopathy, lumbar region: Secondary | ICD-10-CM | POA: Diagnosis not present

## 2019-09-15 DIAGNOSIS — M5416 Radiculopathy, lumbar region: Secondary | ICD-10-CM | POA: Diagnosis not present

## 2019-09-15 DIAGNOSIS — Z01812 Encounter for preprocedural laboratory examination: Secondary | ICD-10-CM | POA: Diagnosis not present

## 2019-09-15 LAB — SARS CORONAVIRUS 2 (TAT 6-24 HRS): SARS Coronavirus 2: NEGATIVE

## 2019-09-17 ENCOUNTER — Ambulatory Visit (HOSPITAL_BASED_OUTPATIENT_CLINIC_OR_DEPARTMENT_OTHER): Payer: Medicare Other | Attending: Critical Care Medicine | Admitting: Pulmonary Disease

## 2019-09-17 ENCOUNTER — Other Ambulatory Visit: Payer: Self-pay

## 2019-09-17 DIAGNOSIS — Z79899 Other long term (current) drug therapy: Secondary | ICD-10-CM | POA: Insufficient documentation

## 2019-09-17 DIAGNOSIS — G4733 Obstructive sleep apnea (adult) (pediatric): Secondary | ICD-10-CM | POA: Insufficient documentation

## 2019-09-18 DIAGNOSIS — G4733 Obstructive sleep apnea (adult) (pediatric): Secondary | ICD-10-CM

## 2019-09-18 NOTE — Procedures (Signed)
    Patient Name: [name], [name] Date: 09/17/2019 Gender: Female D.O.B: 13-Feb-1946 Age (years): 73 Referring Provider: Noemi Chapel DO Height (inches): 66 Interpreting Physician: Chesley Mires MD, ABSM Weight (lbs): 175 RPSGT: Zadie Rhine BMI: 30 MRN: [id number] Neck Size: 13.50  CLINICAL INFORMATION Sleep Study Type: NPSG  Indication for sleep study: OSA  Epworth Sleepiness Score: 2  SLEEP STUDY TECHNIQUE As per the AASM Manual for the Scoring of Sleep and Associated Events v2.3 (April 2016) with a hypopnea requiring 4% desaturations.  The channels recorded and monitored were frontal, central and occipital EEG, electrooculogram (EOG), submentalis EMG (chin), nasal and oral airflow, thoracic and abdominal wall motion, anterior tibialis EMG, snore microphone, electrocardiogram, and pulse oximetry.  MEDICATIONS Medications self-administered by patient taken the night of the study : BENADRYL, GABAPENTIN, SLEEPING PILL  SLEEP ARCHITECTURE The study was initiated at 10:59:20 PM and ended at 4:56:49 AM.  Sleep onset time was 12.4 minutes and the sleep efficiency was 91.3%%. The total sleep time was 326.5 minutes.  Stage REM latency was 111.5 minutes.  The patient spent 2.6%% of the night in stage N1 sleep, 72.9%% in stage N2 sleep, 0.0%% in stage N3 and 24.5% in REM.  Alpha intrusion was absent.  Supine sleep was 100.00%.  RESPIRATORY PARAMETERS The overall apnea/hypopnea index (AHI) was 10.5 per hour. There were 13 total apneas, including 13 obstructive, 0 central and 0 mixed apneas. There were 44 hypopneas and 10 RERAs.  The AHI during Stage REM sleep was 34.5 per hour.  AHI while supine was 10.5 per hour.  The mean oxygen saturation was 92.5%. The minimum SpO2 during sleep was 76.0%.  soft snoring was noted during this study.  CARDIAC DATA The 2 lead EKG demonstrated sinus rhythm. The mean heart rate was 66.0 beats per minute. Other EKG findings include:  None.  LEG MOVEMENT DATA The total PLMS were 0 with a resulting PLMS index of 0.0. Associated arousal with leg movement index was 0.4 .  IMPRESSIONS - Mild obstructive sleep apnea with an AHI of 10.5 and SpO2 low of 76%. - Significant REM effect with REM AHI 34.5.  DIAGNOSIS - Obstructive Sleep Apnea (327.23 [G47.33 ICD-10])  RECOMMENDATIONS - Additional therapies include weight loss, CPAP, oral appliance, or surgical assessment.  [Electronically signed] 09/18/2019 01:18 PM  Chesley Mires MD, [place], American Board of Sleep Medicine   NPI: [id number]

## 2019-09-22 DIAGNOSIS — M545 Low back pain: Secondary | ICD-10-CM | POA: Diagnosis not present

## 2019-09-22 DIAGNOSIS — M19071 Primary osteoarthritis, right ankle and foot: Secondary | ICD-10-CM | POA: Diagnosis not present

## 2019-09-22 DIAGNOSIS — M19072 Primary osteoarthritis, left ankle and foot: Secondary | ICD-10-CM | POA: Diagnosis not present

## 2019-09-22 NOTE — Progress Notes (Signed)
[name], Can we find out why [name] has not followed up for her sleep medicine appointments as prescribed? Her sleep study is positive for OSA, but she needs to establish with a sleep medicine doctor. She has cancelled appointments with Dr. Halford Chessman. Thanks!  Arby Barrette

## 2019-09-23 DIAGNOSIS — Z1159 Encounter for screening for other viral diseases: Secondary | ICD-10-CM | POA: Diagnosis not present

## 2019-09-28 DIAGNOSIS — K648 Other hemorrhoids: Secondary | ICD-10-CM | POA: Diagnosis not present

## 2019-09-28 DIAGNOSIS — K6389 Other specified diseases of intestine: Secondary | ICD-10-CM | POA: Diagnosis not present

## 2019-09-28 DIAGNOSIS — Z1211 Encounter for screening for malignant neoplasm of colon: Secondary | ICD-10-CM | POA: Diagnosis not present

## 2019-09-28 LAB — HM COLONOSCOPY

## 2019-09-29 ENCOUNTER — Telehealth: Payer: Self-pay | Admitting: *Deleted

## 2019-09-29 NOTE — Telephone Encounter (Signed)
I spoke to [name] regarding her PSG results indicating that she has mild OSA. She is not sure if she wants to treat this because she does not think that she would want to wear a mask while sleeping. She will call back to let us know if she wants a sleep medicine appointment made, but she is declining it currently.   Julian Hy, DO 09/29/19 2:23 PM [facility]

## 2019-09-29 NOTE — Telephone Encounter (Signed)
-----   Message from Julian Hy, DO sent at 09/22/2019  2:21 PM EST -----   ----- Message ----- From: Chesley Mires, MD Sent: 09/18/2019   1:19 PM EST To: [name], MD, Julian Hy, DO

## 2019-09-29 NOTE — Telephone Encounter (Signed)
Spoke with patient. She states she has never cancelled an appt with Dr. Halford Chessman. She has had her sleep study, I let her know it was positive, she wants to know her results as far as oxygen dropping or stop breathing. She is not going to make an appointment with Dr. Halford Chessman at this time. She want to speak with Dr. Carlis Abbott.   Will route to Dr. Carlis Abbott for recommendations

## 2019-09-30 ENCOUNTER — Other Ambulatory Visit: Payer: Self-pay | Admitting: Gastroenterology

## 2019-09-30 DIAGNOSIS — K389 Disease of appendix, unspecified: Secondary | ICD-10-CM

## 2019-10-07 ENCOUNTER — Ambulatory Visit
Admission: RE | Admit: 2019-10-07 | Discharge: 2019-10-07 | Disposition: A | Discharge status: Home / Self Care | Payer: Medicare Other | Source: Ambulatory Visit | Attending: Gastroenterology | Admitting: Gastroenterology

## 2019-10-07 DIAGNOSIS — K389 Disease of appendix, unspecified: Secondary | ICD-10-CM

## 2019-10-07 MED ORDER — IOPAMIDOL (ISOVUE-300) INJECTION 61%
100.0000 mL | Freq: Once | INTRAVENOUS | Status: AC | PRN
  Administered 2019-10-07: 100 mL via INTRAVENOUS

## 2019-10-13 ENCOUNTER — Encounter: Payer: Self-pay | Admitting: Critical Care Medicine

## 2019-10-13 ENCOUNTER — Other Ambulatory Visit: Payer: Self-pay

## 2019-10-13 ENCOUNTER — Ambulatory Visit (INDEPENDENT_AMBULATORY_CARE_PROVIDER_SITE_OTHER): Payer: Medicare Other | Admitting: Critical Care Medicine

## 2019-10-13 VITALS — BP 112/68 | HR 91 | Temp 97.1°F | Ht 64.0 in | Wt 167.4 lb

## 2019-10-13 DIAGNOSIS — R062 Wheezing: Secondary | ICD-10-CM | POA: Diagnosis not present

## 2019-10-13 DIAGNOSIS — J4530 Mild persistent asthma, uncomplicated: Secondary | ICD-10-CM

## 2019-10-13 DIAGNOSIS — G4733 Obstructive sleep apnea (adult) (pediatric): Secondary | ICD-10-CM | POA: Diagnosis not present

## 2019-10-13 DIAGNOSIS — Z579 Occupational exposure to unspecified risk factor: Secondary | ICD-10-CM

## 2019-10-13 DIAGNOSIS — J4520 Mild intermittent asthma, uncomplicated: Secondary | ICD-10-CM | POA: Diagnosis not present

## 2019-10-13 NOTE — Patient Instructions (Addendum)
Thank you for visiting Dr. Carlis Abbott at [facility]. We recommend the following:  Use your albuterol every 6 hours for the next few days. If your wheezing continues, please call to let me know.  Keep using your Advair 2 times daily. Rinse your mouth after every use.   Return in about 6 months (around 04/12/2020).    Please do your part to reduce the spread of COVID-19.

## 2019-10-13 NOTE — Progress Notes (Signed)
Synopsis: Referred in June 2017 for bronchitis by [name], MD.  Previously patient of Dr. Melvyn Novas.  Subjective:   PATIENT ID: [name] GENDER: female DOB: 1946/07/18, MRN: [id number]  Chief Complaint  Patient presents with  . Follow-up    Patient is here for asthma follow up. Patient states she feels the same as last visit. Patient does have shortness of breath with exertion.     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  Her asthma has been well controlled recently on Advair.  She has had mild thick mucus, coughing and wheezing for the past few days.  She denies rhinorrhea, postnasal drip, fever.  She has not been using her albuterol at all.  She continues to stay active, walking with her grandson's dog up to 45 minutes at a time, sometimes walking twice per day.  She occasionally rests due to back pain, but infrequently due to breathing.  She saw multiple family members over the Christmas holiday, but reports that they wore masks the whole time.  She went for her inpatient sleep study, but did not meet criteria for sleep apnea titration.  She denies any daytime symptoms of fatigue and is constantly on the move.  She sleeps well and wakes up refreshed.  She is unable to sleep on her sides due to previous shoulder replacement on the left and shoulder arthritis with rib fractures on the right.  She often sleeps sitting slightly propped up.     07/15/2019: [name] is a 73 year old woman who presents for evaluation of asthma.  She was diagnosed with asthma by her primary care provider after retirement in 2007.  She formerly worked at [employer] in [place].  She was not around grains, but was around the beers that was being..  She did not wear a respirator at work.  She was exposed to many chemicals, but she is unsure what they were.  Multiple of her former colleagues have also  developed asthma or other respiratory issues.  She does not have childhood asthma and there is no family history of lung disease.  She has no history of immunosuppression, immune deficiency, and the only family history of rheumatologic diseases her sister with rheumatoid arthritis.  She has been maintained on Advair twice daily for several years, which helps her symptoms.  She uses albuterol infrequently, less than once per month.  Her main symptoms are dyspnea on exertion, but she can walk 20 to 30 minutes before having to stop to rest.  She does not cough regularly, but expectorates gray, thick sputum several times throughout the day.  Mucinex helps with this.  No hemoptysis.  She only has wheezing when she has an infection.. She had pneumonia once as a child and a few times as an adult.  In 2005 she had pneumothorax after falling and breaking several ribs on the right side; she was in the hospital for a week with a chest tube.  Whenever she gets a respiratory infection it moved into her chest quickly, and she requires antibiotics.  Overall she is healthy and does not feel that her activity is significantly limited.  Her son is concerned by her symptoms, and has recommended that she be seen by pulmonology he is a never smoker/vaper.  She has no history of environmental allergies.  She has well-controlled GERD.  Her son is concerned that he has seen her  when she is sleeping appear to stop breathing.  She denies morning headaches or daytime fatigue.  ESS = 0.  She has never been evaluated for sleep apnea.     Flowsheet data:  Lab Results  Component Value Date   NITRICOXIDE 14 04/04/2016    Past Medical History:  Diagnosis Date  . Arthritis   . GERD (gastroesophageal reflux disease)    at times  . Hyperlipidemia   . Hypertension   . Peripheral vascular disease ([place])    has spider veins  . Pneumothorax, traumatic    RIGHT; associated with multiple rib fractures after a fall     Family  History  Problem Relation Age of Onset  . Breast cancer Sister   . Rheum arthritis Sister   . Breast cancer Paternal Grandmother   . Kidney Stones Mother   . CVA Mother   . Hypertension Father   . Anesthesia problems Neg Hx   . Hypotension Neg Hx   . Malignant hyperthermia Neg Hx   . Pseudochol deficiency Neg Hx      Past Surgical History:  Procedure Laterality Date  . ABDOMINAL HYSTERECTOMY  1982  . AUGMENTATION MAMMAPLASTY Bilateral    Patient has bilateral implants   . 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 BREAST SURGERY    . JOINT REPLACEMENT  2009   right knee  . JOINT REPLACEMENT  2011   left knee  . MASTECTOMY  2008   right breast  . REDUCTION MAMMAPLASTY Left    Patient had lift with implant on Left side   . SHOULDER ARTHROSCOPY DISTAL CLAVICLE EXCISION AND OPEN ROTATOR CUFF REPAIR  2011  . TOTAL SHOULDER ARTHROPLASTY  03/14/2012   Procedure: TOTAL SHOULDER ARTHROPLASTY;  Surgeon: Alta Corning, MD;  Location: [facility];  Service: Orthopedics;  Laterality: Left;    Social History   Socioeconomic History  . Marital status: Divorced    Spouse name: Not on file  . Number of children: 2  . Years of education: Not on file  . Highest education level: Not on file  Occupational History  . Occupation: Retired  . Occupation: Glass blower/designer    Comment: Arts development officer  Tobacco Use  . Smoking status: Never Smoker  . Smokeless tobacco: Never Used  Substance and Sexual Activity  . Alcohol use: No    Alcohol/week: 0.0 standard drinks  . Drug use: No  . Sexual activity: Not on file  Other Topics Concern  . Not on file  Social History Narrative  . Not on file   Social Determinants of Health   Financial Resource Strain:   . Difficulty of Paying Living Expenses: Not on file  Food Insecurity:   . Worried About Charity fundraiser in the Last Year: Not on file  . Ran Out of Food in the Last Year: Not on file  Transportation Needs:   . Lack of  Transportation (Medical): Not on file  . Lack of Transportation (Non-Medical): Not on file  Physical Activity:   . Days of Exercise per Week: Not on file  . Minutes of Exercise per Session: Not on file  Stress:   . Feeling of Stress : Not on file  Social Connections:   . Frequency of Communication with Friends and Family: Not on file  . Frequency of Social Gatherings with Friends and Family: Not on file  . Attends Religious Services: Not on file  . Active Member of Clubs or  Organizations: Not on file  . Attends Archivist Meetings: Not on file  . Marital Status: Not on file  Intimate Partner Violence:   . Fear of Current or Ex-Partner: Not on file  . Emotionally Abused: Not on file  . Physically Abused: Not on file  . Sexually Abused: Not on file     Allergies  Allergen Reactions  . Dilaudid [Hydromorphone Hcl] Itching  . Morphine And Related Nausea And Vomiting     Immunization History  Administered Date(s) Administered  . Fluad Quad(high Dose 65+) 07/15/2019  . Influenza, High Dose Seasonal PF 06/18/1946, 08/13/2018  . Influenza-Unspecified 08/15/2016, 08/17/2017, 07/15/2019  . Meningococcal Polysaccharide 07/17/2016  . Pneumococcal Conjugate-13 09/13/2014  . Tdap 02/02/2014    Outpatient Medications Prior to Visit  Medication Sig Dispense Refill  . acetaminophen (TYLENOL) 325 MG tablet Take 650 mg by mouth every 6 (six) hours as needed.    Marland Kitchen albuterol (PROAIR HFA) 108 (90 Base) MCG/ACT inhaler Inhale 2 puffs into the lungs every 6 (six) hours as needed for wheezing or shortness of breath. 18 g 0  . atorvastatin (LIPITOR) 20 MG tablet Take 1 tablet (20 mg total) by mouth daily at 6 PM. 30 tablet 5  . Dextromethorphan-Guaifenesin 20-400 MG TABS Take by mouth daily.    . diphenhydrAMINE (SOMINEX) 25 MG tablet Take 50 mg by mouth at bedtime as needed for sleep.    . famotidine (PEPCID) 20 MG tablet Take 1 tablet (20 mg total) by mouth 2 (two) times daily. 30  tablet 0  . fenofibrate (TRICOR) 145 MG tablet TAKE 1 TABLET(145 MG) BY MOUTH DAILY 30 tablet 5  . fluticasone-salmeterol (ADVAIR HFA) 115-21 MCG/ACT inhaler Inhale 2 puffs into the lungs 2 (two) times daily. 12 g 5  . gabapentin (NEURONTIN) 300 MG capsule 1 po BID prn. 60 capsule 1  . GUAIFENESIN PO Take 400 mg by mouth 2 (two) times daily.    Marland Kitchen ibuprofen (ADVIL,MOTRIN) 100 MG tablet Take 100 mg by mouth every 6 (six) hours as needed.    . meclizine (ANTIVERT) 25 MG tablet Take 1 tablet (25 mg total) by mouth 3 (three) times daily as needed for dizziness. 30 tablet 0  . Multiple Vitamin (MULTIVITAMIN) capsule Take 1 capsule by mouth daily.    Marland Kitchen olmesartan-hydrochlorothiazide (BENICAR HCT) 20-12.5 MG tablet TAKE 1 TABLET BY MOUTH DAILY 30 tablet 5  . oxymetazoline (AFRIN) 0.05 % nasal spray Place 1 spray into both nostrils Nightly.    Marland Kitchen Respiratory Therapy Supplies (FLUTTER) DEVI 1 each by Does not apply route 2 (two) times daily. 1 each 0   No facility-administered medications prior to visit.    Review of Systems  Constitutional: Negative for chills, fever, malaise/fatigue and weight loss.  HENT: Negative for congestion.        No postnasal drip  Eyes: Negative.   Respiratory: Positive for sputum production and shortness of breath. Negative for cough, hemoptysis and wheezing.   Cardiovascular: Negative for chest pain, palpitations and leg swelling.  Gastrointestinal: Negative for constipation, heartburn, melena, nausea and vomiting.  Genitourinary: Negative.   Musculoskeletal: Negative for myalgias.       Chronic right shoulder pain and weakness from OA  Skin: Negative for rash.  Neurological: Negative for focal weakness and headaches.  Endo/Heme/Allergies: Negative for environmental allergies.  Psychiatric/Behavioral: Negative.      Objective:   Vitals:   10/13/19 1442  BP: 112/68  Pulse: 91  Temp: (!) 97.1 F (36.2 C)  TempSrc: Temporal  SpO2: 94%  Weight: 167 lb 6.4 oz  (75.9 kg)  Height: 5\' 4"  (1.626 m)   94% on   RA BMI Readings from Last 3 Encounters:  10/13/19 28.73 kg/m  09/17/19 30.04 kg/m  07/29/19 28.67 kg/m   Wt Readings from Last 3 Encounters:  10/13/19 167 lb 6.4 oz (75.9 kg)  09/17/19 175 lb (79.4 kg)  07/29/19 167 lb (75.8 kg)    Physical Exam Vitals reviewed.  HENT:     Head: Normocephalic and atraumatic.     Nose:     Comments: Deferred due to masking requirement.    Mouth/Throat:     Comments: Deferred due to masking requirement. Eyes:     General: No scleral icterus. Cardiovascular:     Rate and Rhythm: Normal rate and regular rhythm.     Heart sounds: No murmur.  Pulmonary:     Comments: Mild inspiratory wheezing throughout.  No conversational dyspnea.  No witnessed coughing. Abdominal:     General: There is no distension.     Tenderness: There is no abdominal tenderness.  Musculoskeletal:        General: No swelling or deformity.     Cervical back: Neck supple.  Lymphadenopathy:     Cervical: No cervical adenopathy.  Skin:    General: Skin is warm and dry.     Findings: No bruising or rash.  Neurological:     Mental Status: She is alert.     Motor: No weakness.     Coordination: Coordination normal.  Psychiatric:        Mood and Affect: Mood normal.        Behavior: Behavior normal.      CBC    Component Value Date/Time   WBC 9.6 06/01/2019 2031   RBC 3.95 06/01/2019 2031   HGB 12.7 06/01/2019 2031   HGB 12.7 07/06/2011 0922   HCT 37.8 06/01/2019 2031   HCT 37.4 07/06/2011 0922   PLT 457 (H) 06/01/2019 2031   PLT 237 07/06/2011 0922   MCV 95.7 06/01/2019 2031   MCV 93.8 12/31/2016 1146   MCV 92.3 07/06/2011 0922   MCH 32.2 06/01/2019 2031   MCHC 33.6 06/01/2019 2031   RDW 11.7 06/01/2019 2031   RDW 12.5 07/06/2011 0922   LYMPHSABS 1.6 05/31/2019 1124   LYMPHSABS 3.1 07/06/2011 0922   MONOABS 0.5 05/31/2019 1124   MONOABS 0.5 07/06/2011 0922   EOSABS 0.2 05/31/2019 1124   EOSABS 0.3  07/06/2011 0922   BASOSABS 0.0 05/31/2019 1124   BASOSABS 0.0 07/06/2011 0922    CHEMISTRY No results for input(s): NA, K, CL, CO2, GLUCOSE, BUN, CREATININE, CALCIUM, MG, PHOS in the last 168 hours. CrCl cannot be calculated (Patient's most recent lab result is older than the maximum 21 days allowed.).   Chest Imaging- films reviewed: CXR 06/01/2019, 2 view- symmetric increased bibasilar opacities with no silhouetting of the hemidiaphragms.  Broken ribs laterally on the right.  Surgical clips in the right axilla.  Left shoulder prosthesis.  CT abdomen pelvis 10/07/2019, lung images reviewed-mild airway thickening and RML, previous right-sided rib fracture with associated pleural thickening and subpleural scarring.  Concerning enhancing lesion in the body of the pancreas  Pulmonary Functions Testing Results: No flowsheet data found. Spirometry 04/04/2016   FVC 2.1 (74%) FEV1 1.5 (68%) Ratio 69% FEF 25-75   45% predicted Mild obstruction   HSAT 07/30/2019 -AHI 15.7. Total of 113 desaturations occurred with nadir SpO2 76%.  Assessment & Plan:        ICD-10-CM   1. OSA (obstructive sleep apnea)  G47.33   2. 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 ESS 0. Did not meet criteria on in-lab study for CPAP titration. -We will monitor symptoms clinically, and if symptoms worsen, will refer for sleep apnea titration study.  Occupational asthma with mild acute exacerbation.  Chronically well controlled.  No longer working.  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  Has bronchorrhea with sputum production throughout the day. -Continue Advair twice daily -We will use albuterol every 6 hours for the next few days.  If wheezing does not resolve she will let us know and she should be started on an outpatient course of prednisone. -Continue albuterol as needed -Up-to-date on seasonal flu and  pneumonia vaccines -Continue COVID-19 precautions-social distancing, mask wearing, handwashing.  I recommend the COVID-19 vaccine when it is available. -Flu shot today.  Up-to-date on pneumonia vaccines. -Continue regular physical activity as she is doing -Continue flutter valve twice daily  Abnormal abdominal CT-ordered after colonoscopy. -She will follow up with PCP and GI  RTC in 6 months.    Current Outpatient Medications:  .  acetaminophen (TYLENOL) 325 MG tablet, Take 650 mg by mouth every 6 (six) hours as needed., Disp: , Rfl:  .  albuterol (PROAIR HFA) 108 (90 Base) MCG/ACT inhaler, Inhale 2 puffs into the lungs every 6 (six) hours as needed for wheezing or shortness of breath., Disp: 18 g, Rfl: 0 .  atorvastatin (LIPITOR) 20 MG tablet, Take 1 tablet (20 mg total) by mouth daily at 6 PM., Disp: 30 tablet, Rfl: 5 .  Dextromethorphan-Guaifenesin 20-400 MG TABS, Take by mouth daily., Disp: , Rfl:  .  diphenhydrAMINE (SOMINEX) 25 MG tablet, Take 50 mg by mouth at bedtime as needed for sleep., Disp: , Rfl:  .  famotidine (PEPCID) 20 MG tablet, Take 1 tablet (20 mg total) by mouth 2 (two) times daily., Disp: 30 tablet, Rfl: 0 .  fenofibrate (TRICOR) 145 MG tablet, TAKE 1 TABLET(145 MG) BY MOUTH DAILY, Disp: 30 tablet, Rfl: 5 .  fluticasone-salmeterol (ADVAIR HFA) 115-21 MCG/ACT inhaler, Inhale 2 puffs into the lungs 2 (two) times daily., Disp: 12 g, Rfl: 5 .  gabapentin (NEURONTIN) 300 MG capsule, 1 po BID prn., Disp: 60 capsule, Rfl: 1 .  GUAIFENESIN PO, Take 400 mg by mouth 2 (two) times daily., Disp: , Rfl:  .  ibuprofen (ADVIL,MOTRIN) 100 MG tablet, Take 100 mg by mouth every 6 (six) hours as needed., Disp: , Rfl:  .  meclizine (ANTIVERT) 25 MG tablet, Take 1 tablet (25 mg total) by mouth 3 (three) times daily as needed for dizziness., Disp: 30 tablet, Rfl: 0 .  Multiple Vitamin (MULTIVITAMIN) capsule, Take 1 capsule by mouth daily., Disp: , Rfl:  .  olmesartan-hydrochlorothiazide  (BENICAR HCT) 20-12.5 MG tablet, TAKE 1 TABLET BY MOUTH DAILY, Disp: 30 tablet, Rfl: 5 .  oxymetazoline (AFRIN) 0.05 % nasal spray, Place 1 spray into both nostrils Nightly., Disp: , Rfl:  .  Respiratory Therapy Supplies (FLUTTER) DEVI, 1 each by Does not apply route 2 (two) times daily., Disp: 1 each, Rfl: 0   Julian Hy, DO  Pulmonary Critical Care 10/13/2019 3:13 PM

## 2019-10-20 DIAGNOSIS — M67911 Unspecified disorder of synovium and tendon, right shoulder: Secondary | ICD-10-CM | POA: Diagnosis not present

## 2019-10-20 DIAGNOSIS — M19011 Primary osteoarthritis, right shoulder: Secondary | ICD-10-CM | POA: Diagnosis not present

## 2019-10-28 DIAGNOSIS — M5416 Radiculopathy, lumbar region: Secondary | ICD-10-CM | POA: Diagnosis not present

## 2019-11-05 ENCOUNTER — Encounter: Payer: Self-pay | Admitting: Family Medicine

## 2019-11-10 DIAGNOSIS — M19011 Primary osteoarthritis, right shoulder: Secondary | ICD-10-CM | POA: Diagnosis not present

## 2019-11-10 DIAGNOSIS — M75121 Complete rotator cuff tear or rupture of right shoulder, not specified as traumatic: Secondary | ICD-10-CM | POA: Diagnosis not present

## 2019-11-11 DIAGNOSIS — M25511 Pain in right shoulder: Secondary | ICD-10-CM | POA: Diagnosis not present

## 2019-12-04 DIAGNOSIS — M19211 Secondary osteoarthritis, right shoulder: Secondary | ICD-10-CM | POA: Diagnosis not present

## 2019-12-07 ENCOUNTER — Other Ambulatory Visit: Payer: Self-pay | Admitting: Gastroenterology

## 2019-12-07 DIAGNOSIS — Q453 Other congenital malformations of pancreas and pancreatic duct: Secondary | ICD-10-CM | POA: Diagnosis not present

## 2020-01-01 ENCOUNTER — Other Ambulatory Visit (HOSPITAL_COMMUNITY)
Admission: RE | Admit: 2020-01-01 | Discharge: 2020-01-01 | Disposition: A | Discharge status: Home / Self Care | Payer: Medicare Other | Source: Ambulatory Visit | Attending: Gastroenterology | Admitting: Gastroenterology

## 2020-01-01 DIAGNOSIS — Z01812 Encounter for preprocedural laboratory examination: Secondary | ICD-10-CM | POA: Diagnosis not present

## 2020-01-01 DIAGNOSIS — Z20822 Contact with and (suspected) exposure to covid-19: Secondary | ICD-10-CM | POA: Insufficient documentation

## 2020-01-01 LAB — SARS CORONAVIRUS 2 (TAT 6-24 HRS): SARS Coronavirus 2: NEGATIVE

## 2020-01-03 ENCOUNTER — Other Ambulatory Visit: Payer: Self-pay | Admitting: Family Medicine

## 2020-01-03 DIAGNOSIS — I10 Essential (primary) hypertension: Secondary | ICD-10-CM

## 2020-01-03 NOTE — Telephone Encounter (Signed)
Requested Prescriptions  Pending Prescriptions Disp Refills  . olmesartan-hydrochlorothiazide (BENICAR HCT) 20-12.5 MG tablet [Pharmacy Med Name: OLMESARTAN MEDOX/HCTZ 20-12.5MG  TAB] 30 tablet 0    Sig: TAKE 1 TABLET BY MOUTH DAILY     Cardiovascular: ARB + Diuretic Combos Failed - 01/03/2020 12:40 PM      Failed - K in normal range and within 180 days    Potassium  Date Value Ref Range Status  07/01/2019 3.7 3.5 - 5.2 mmol/L Final         Failed - Na in normal range and within 180 days    Sodium  Date Value Ref Range Status  07/01/2019 140 134 - 144 mmol/L Final         Failed - Cr in normal range and within 180 days    Creat  Date Value Ref Range Status  06/13/2016 0.88 0.60 - 0.93 mg/dL Final    Comment:      For patients > or = 75 years of age: The upper reference limit for Creatinine is approximately 13% higher for people identified as African-American.      Creatinine, Ser  Date Value Ref Range Status  07/01/2019 0.82 0.57 - 1.00 mg/dL Final         Failed - Ca in normal range and within 180 days    Calcium  Date Value Ref Range Status  07/01/2019 10.2 8.7 - 10.3 mg/dL Final         Passed - Patient is not pregnant      Passed - Last BP in normal range    BP Readings from Last 1 Encounters:  10/13/19 112/68         Passed - Valid encounter within last 6 months    Recent Outpatient Visits          5 months ago Decreased appetite   [facility], MD   6 months ago Essential hypertension   [facility], Ranell Patrick, MD   8 months ago Medicare annual wellness visit, subsequent   [facility], Ranell Patrick, MD   11 months ago Hyperglycemia   [facility], Ranell Patrick, MD   1 year ago Wheezing   [facility], Ranell Patrick, MD

## 2020-01-05 ENCOUNTER — Ambulatory Visit (HOSPITAL_COMMUNITY)
Admission: RE | Admit: 2020-01-05 | Discharge: 2020-01-05 | Disposition: A | Discharge status: Home / Self Care | Payer: Medicare Other | Attending: Gastroenterology | Admitting: Gastroenterology

## 2020-01-05 ENCOUNTER — Other Ambulatory Visit: Payer: Self-pay

## 2020-01-05 ENCOUNTER — Encounter (HOSPITAL_COMMUNITY)
Admission: RE | Disposition: A | Discharge status: Home / Self Care | Payer: Self-pay | Source: Home / Self Care | Attending: Gastroenterology

## 2020-01-05 ENCOUNTER — Ambulatory Visit (HOSPITAL_COMMUNITY): Payer: Medicare Other | Admitting: Anesthesiology

## 2020-01-05 ENCOUNTER — Encounter (HOSPITAL_COMMUNITY): Payer: Self-pay | Admitting: Gastroenterology

## 2020-01-05 DIAGNOSIS — R933 Abnormal findings on diagnostic imaging of other parts of digestive tract: Secondary | ICD-10-CM | POA: Insufficient documentation

## 2020-01-05 DIAGNOSIS — E669 Obesity, unspecified: Secondary | ICD-10-CM | POA: Insufficient documentation

## 2020-01-05 DIAGNOSIS — Z7951 Long term (current) use of inhaled steroids: Secondary | ICD-10-CM | POA: Insufficient documentation

## 2020-01-05 DIAGNOSIS — D490 Neoplasm of unspecified behavior of digestive system: Secondary | ICD-10-CM | POA: Insufficient documentation

## 2020-01-05 DIAGNOSIS — E785 Hyperlipidemia, unspecified: Secondary | ICD-10-CM | POA: Diagnosis not present

## 2020-01-05 DIAGNOSIS — K219 Gastro-esophageal reflux disease without esophagitis: Secondary | ICD-10-CM | POA: Insufficient documentation

## 2020-01-05 DIAGNOSIS — K862 Cyst of pancreas: Secondary | ICD-10-CM | POA: Diagnosis not present

## 2020-01-05 DIAGNOSIS — Z79899 Other long term (current) drug therapy: Secondary | ICD-10-CM | POA: Insufficient documentation

## 2020-01-05 DIAGNOSIS — I739 Peripheral vascular disease, unspecified: Secondary | ICD-10-CM | POA: Insufficient documentation

## 2020-01-05 DIAGNOSIS — J45909 Unspecified asthma, uncomplicated: Secondary | ICD-10-CM | POA: Insufficient documentation

## 2020-01-05 DIAGNOSIS — I10 Essential (primary) hypertension: Secondary | ICD-10-CM | POA: Diagnosis not present

## 2020-01-05 DIAGNOSIS — Z6831 Body mass index (BMI) 31.0-31.9, adult: Secondary | ICD-10-CM | POA: Diagnosis not present

## 2020-01-05 DIAGNOSIS — C251 Malignant neoplasm of body of pancreas: Secondary | ICD-10-CM | POA: Diagnosis not present

## 2020-01-05 DIAGNOSIS — K8689 Other specified diseases of pancreas: Secondary | ICD-10-CM | POA: Diagnosis not present

## 2020-01-05 DIAGNOSIS — M1990 Unspecified osteoarthritis, unspecified site: Secondary | ICD-10-CM | POA: Diagnosis not present

## 2020-01-05 HISTORY — PX: ESOPHAGOGASTRODUODENOSCOPY (EGD) WITH PROPOFOL: SHX5813

## 2020-01-05 HISTORY — PX: EUS: SHX5427

## 2020-01-05 HISTORY — PX: FINE NEEDLE ASPIRATION: SHX5430

## 2020-01-05 LAB — GLUCOSE, CAPILLARY: Glucose-Capillary: 105 mg/dL — ABNORMAL HIGH (ref 70–99)

## 2020-01-05 SURGERY — UPPER ENDOSCOPIC ULTRASOUND (EUS) LINEAR
Anesthesia: Monitor Anesthesia Care

## 2020-01-05 MED ORDER — PROPOFOL 10 MG/ML IV BOLUS
INTRAVENOUS | Status: DC | PRN
  Administered 2020-01-05: 50 mg via INTRAVENOUS
  Administered 2020-01-05 (×2): 20 mg via INTRAVENOUS

## 2020-01-05 MED ORDER — SODIUM CHLORIDE 0.9 % IV SOLN: INTRAVENOUS | Status: DC

## 2020-01-05 MED ORDER — LACTATED RINGERS IV SOLN
INTRAVENOUS | Status: DC
  Administered 2020-01-05: 08:00:00 1000 mL via INTRAUTERINE

## 2020-01-05 MED ORDER — PROPOFOL 500 MG/50ML IV EMUL
INTRAVENOUS | Status: AC
  Filled 2020-01-05: qty 50

## 2020-01-05 MED ORDER — PROPOFOL 500 MG/50ML IV EMUL
INTRAVENOUS | Status: DC | PRN
  Administered 2020-01-05: 125 ug/kg/min via INTRAVENOUS

## 2020-01-05 NOTE — H&P (Signed)
Patient interval history reviewed.  Patient examined again.  There has been no change from documented H/P dated 01/05/20 (scanned into chart from our office) except as documented above.  Assessment:  1.  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  Plan:  1.  Upper endoscopic ultrasound with possible fine needle aspiration. 2.  Risks (bleeding, infection, bowel perforation that could require surgery, sedation-related changes in cardiopulmonary systems), benefits (identification and possible treatment of source of symptoms, exclusion of certain causes of symptoms), and alternatives (watchful waiting, radiographic imaging studies, empiric medical treatment) of upper endoscopy with ultrasound and possible fine needle aspiration (EUS +/- FNA) were explained to patient/family in detail and patient wishes to proceed.

## 2020-01-05 NOTE — Anesthesia Postprocedure Evaluation (Signed)
Anesthesia Post Note  Patient: [name]  Procedure(s) Performed: UPPER ENDOSCOPIC ULTRASOUND (EUS) LINEAR (N/A ) FINE NEEDLE ASPIRATION (FNA) LINEAR (N/A )     Patient location during evaluation: PACU Anesthesia Type: MAC Level of consciousness: awake and alert Pain management: pain level controlled Vital Signs Assessment: post-procedure vital signs reviewed and stable Respiratory status: spontaneous breathing, nonlabored ventilation and respiratory function stable Cardiovascular status: blood pressure returned to baseline and stable Postop Assessment: no apparent nausea or vomiting Anesthetic complications: no    Last Vitals:  Vitals:   01/05/20 1050 01/05/20 1056  BP: (!) 148/77 (!) 145/67  Pulse: 73 71  Resp: 16 19  Temp:    SpO2: 100% 100%    Last Pain:  Vitals:   01/05/20 1050  TempSrc:   PainSc: 0-No pain                 Jarome Matin Lucille Witts

## 2020-01-05 NOTE — Anesthesia Procedure Notes (Signed)
Procedure Name: MAC Date/Time: 01/05/2020 10:11 AM Performed by: Deliah Boston, CRNA Pre-anesthesia Checklist: Patient identified, Emergency Drugs available, Suction available and Patient being monitored Patient Re-evaluated:Patient Re-evaluated prior to induction Oxygen Delivery Method: Simple face mask Preoxygenation: Pre-oxygenation with 100% oxygen Placement Confirmation: positive ETCO2 and breath sounds checked- equal and bilateral

## 2020-01-05 NOTE — Anesthesia Preprocedure Evaluation (Addendum)
Anesthesia Evaluation  Patient identified by MRN, date of birth, ID band Patient awake    Reviewed: Allergy & Precautions, H&P , NPO status , Patient's Chart, lab work & pertinent test results  History of Anesthesia Complications (+) PONV  Airway Mallampati: II  TM Distance: >3 FB Neck ROM: Full    Dental  (+) Teeth Intact, Caps,    Pulmonary asthma ,  Inhalers: advair- last took yesterday, albuterol rescue inhaler only uses 2x/year   Pulmonary exam normal breath sounds clear to auscultation       Cardiovascular hypertension, Pt. on medications + Peripheral Vascular Disease  Normal cardiovascular exam Rhythm:Regular  HLD   Neuro/Psych negative neurological ROS  negative psych ROS   GI/Hepatic Neg liver ROS, GERD  Medicated and Controlled,Suspected small neuroendocrine tumor on body of pancreas  Asymptomatic GERD currently   Endo/Other  negative endocrine ROSObesity BMI 31  Renal/GU negative Renal ROS  negative genitourinary   Musculoskeletal  (+) Arthritis , Osteoarthritis,    Abdominal Normal abdominal exam  (+)   Peds  Hematology negative hematology ROS (+)   Anesthesia Other Findings   Reproductive/Obstetrics negative OB ROS                           Anesthesia Physical Anesthesia Plan  ASA: II  Anesthesia Plan: MAC   Post-op Pain Management:    Induction:   PONV Risk Score and Plan: 2 and Propofol infusion and TIVA  Airway Management Planned: Natural Airway and Simple Face Mask  Additional Equipment: None  Intra-op Plan:   Post-operative Plan:   Informed Consent: I have reviewed the patients History and Physical, chart, labs and discussed the procedure including the risks, benefits and alternatives for the proposed anesthesia with the patient or authorized representative who has indicated his/her understanding and acceptance.       Plan Discussed with:  CRNA  Anesthesia Plan Comments:         Anesthesia Quick Evaluation

## 2020-01-05 NOTE — Op Note (Signed)
[facility] Patient Name: [name] Procedure Date: 01/05/2020 MRN: [id number] Attending MD: Arta Silence , MD Date of Birth: 20-Jun-1946 CSN: [id number] Age: 74 Admit Type: Outpatient Procedure:                Upper EUS Indications:              Suspected mass in pancreas on MRI Providers:                Arta Silence, MD, Carmie End, RN, Reda Citron Dalton, Technician Referring MD:              Medicines:                Monitored Anesthesia Care Complications:            No immediate complications. Estimated Blood Loss:     Estimated blood loss: none. Procedure:                Pre-Anesthesia Assessment:                           - Prior to the procedure, a History and Physical                            was performed, and patient medications and                            allergies were reviewed. The patient's tolerance of                            previous anesthesia was also reviewed. The risks                            and benefits of the procedure and the sedation                            options and risks were discussed with the patient.                            All questions were answered, and informed consent                            was obtained. Prior Anticoagulants: The patient has                            taken no previous anticoagulant or antiplatelet                            agents. ASA Grade Assessment: II - A patient with                            mild systemic disease. After reviewing the risks  and benefits, the patient was deemed in                            satisfactory condition to undergo the procedure.                           After obtaining informed consent, the endoscope was                            passed under direct vision. Throughout the                            procedure, the patient's blood pressure, pulse, and                            oxygen saturations  were monitored continuously. The                            [id number]) Olympus Linear EUS was                            introduced through the mouth, and advanced to the                            antrum of the stomach. The upper EUS was                            accomplished without difficulty. The patient                            tolerated the procedure well. Scope In: Scope Out: Findings:      ENDOSONOGRAPHIC FINDING: :      An anechoic lesion suggestive of a cyst was identified in the genu of       the pancreas. It is not in obvious communication with the pancreatic       duct. The lesion measured 6 mm by 6 mm in maximal cross-sectional       diameter. There were 2 compartments without septae. The outer wall of       the lesion was not seen. There was no associated mass. There was no       internal debris within the fluid-filled cavity.      A round mass was identified in the pancreatic body. The mass was       hypoechoic. The mass measured 10 mm by 11 mm in maximal cross-sectional       diameter. The endosonographic borders were well-defined. Fine needle       aspiration for cytology was performed. Color Doppler imaging was       utilized prior to needle puncture to confirm a lack of significant       vascular structures within the needle path. Three passes were made with       the 25 gauge needle using a transgastric approach. A stylet was used. A       cytotechnologist was present to evaluate the adequacy of the specimen.       Final cytology results are pending. Impression:               -  A cystic lesion was seen in the genu of the                            pancreas.                           - A mass was identified in the pancreatic body.                            Fine needle aspiration performed. 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 Moderate Sedation:      None Recommendation:           - Discharge patient to home (via wheelchair).                           - Resume previous diet today.                           - Continue present medications.                           - Await cytology results.                           - Return to GI clinic after studies are complete.                           - Return to referring physician as previously                            scheduled. Procedure Code(s):        --- Professional ---                           [phone], Esophagogastroduodenoscopy, flexible,                            transoral; with transendoscopic ultrasound-guided                            intramural or transmural fine needle                            aspiration/biopsy(s) (includes endoscopic                            ultrasound examination of the esophagus, stomach,  and either the duodenum or a surgically altered                            stomach where the jejunum is examined distal to the                            anastomosis) Diagnosis Code(s):        --- Professional ---                           K86.2, Cyst of pancreas                           K86.89, Other specified diseases of pancreas                           R93.3, Abnormal findings on diagnostic imaging of                            other parts of digestive tract CPT copyright 2019 American Medical Association. All rights reserved. The codes documented in this report are preliminary and upon coder review may  be revised to meet current compliance requirements. Arta Silence, MD 01/05/2020 10:48:03 AM This report has been signed electronically. Number of Addenda: 0

## 2020-01-05 NOTE — Discharge Instructions (Signed)
YOU HAD AN ENDOSCOPIC PROCEDURE TODAY: Refer to the procedure report and other information in the discharge instructions given to you for any specific questions about what was found during the examination. If this information does not answer your questions, please call [place] office at [phone] to clarify.  ° °YOU SHOULD EXPECT: Some feelings of bloating in the abdomen. Passage of more gas than usual. Walking can help get rid of the air that was put into your GI tract during the procedure and reduce the bloating. If you had a lower endoscopy (such as a colonoscopy or flexible sigmoidoscopy) you may notice spotting of blood in your stool or on the toilet paper. Some abdominal soreness may be present for a day or two, also. ° °DIET: Your first meal following the procedure should be a light meal and then it is ok to progress to your normal diet. A half-sandwich or bowl of soup is an example of a good first meal. Heavy or fried foods are harder to digest and may make you feel nauseous or bloated. Drink plenty of fluids but you should avoid alcoholic beverages for 24 hours. If you had a esophageal dilation, please see attached instructions for diet.   ° °ACTIVITY: Your care partner should take you home directly after the procedure. You should plan to take it easy, moving slowly for the rest of the day. You can resume normal activity the day after the procedure however YOU SHOULD NOT DRIVE, use power tools, machinery or perform tasks that involve climbing or major physical exertion for 24 hours (because of the sedation medicines used during the test).  ° °SYMPTOMS TO REPORT IMMEDIATELY: °A gastroenterologist can be reached at any hour. Please call [phone]  for any of the following symptoms:  °Following lower endoscopy (colonoscopy, flexible sigmoidoscopy) °Excessive amounts of blood in the stool  °Significant tenderness, worsening of abdominal pains  °Swelling of the abdomen that is new, acute  °Fever of 100° or  higher  °Following upper endoscopy (EGD, EUS, ERCP, esophageal dilation) °Vomiting of blood or coffee ground material  °New, significant abdominal pain  °New, significant chest pain or pain under the shoulder blades  °Painful or persistently difficult swallowing  °New shortness of breath  °Black, tarry-looking or red, bloody stools ° °FOLLOW UP:  °If any biopsies were taken you will be contacted by phone or by letter within the next 1-3 weeks. Call [phone]  if you have not heard about the biopsies in 3 weeks.  °Please also call with any specific questions about appointments or follow up tests. ° °

## 2020-01-05 NOTE — Transfer of Care (Signed)
Immediate Anesthesia Transfer of Care Note  Patient: [name]  Procedure(s) Performed: Procedure(s): UPPER ENDOSCOPIC ULTRASOUND (EUS) LINEAR (N/A) FINE NEEDLE ASPIRATION (FNA) LINEAR (N/A)  Patient Location: PACU  Anesthesia Type:MAC  Level of Consciousness: Patient easily awoken, sedated, comfortable, cooperative, following commands, responds to stimulation.   Airway & Oxygen Therapy: Patient spontaneously breathing, ventilating well, oxygen via simple oxygen mask.  Post-op Assessment: Report given to PACU RN, vital signs reviewed and stable, moving all extremities.   Post vital signs: Reviewed and stable.  Complications: No apparent anesthesia complications Last Vitals:  Vitals Value Taken Time  BP    Temp    Pulse 74 01/05/20 1048  Resp 18 01/05/20 1048  SpO2 100 % 01/05/20 1048  Vitals shown include unvalidated device data.  Last Pain:  Vitals:   01/05/20 0802  TempSrc: Oral         Complications: No apparent anesthesia complications

## 2020-01-07 ENCOUNTER — Encounter: Payer: Self-pay | Admitting: *Deleted

## 2020-01-07 LAB — CYTOLOGY - NON PAP

## 2020-01-13 DIAGNOSIS — M7062 Trochanteric bursitis, left hip: Secondary | ICD-10-CM | POA: Diagnosis not present

## 2020-01-13 DIAGNOSIS — M25552 Pain in left hip: Secondary | ICD-10-CM | POA: Diagnosis not present

## 2020-01-21 DIAGNOSIS — M25552 Pain in left hip: Secondary | ICD-10-CM | POA: Diagnosis not present

## 2020-02-01 ENCOUNTER — Other Ambulatory Visit: Payer: Self-pay | Admitting: General Surgery

## 2020-02-01 DIAGNOSIS — J45909 Unspecified asthma, uncomplicated: Secondary | ICD-10-CM | POA: Diagnosis not present

## 2020-02-01 DIAGNOSIS — D3A8 Other benign neuroendocrine tumors: Secondary | ICD-10-CM | POA: Diagnosis not present

## 2020-02-03 ENCOUNTER — Telehealth: Payer: Self-pay | Admitting: Family Medicine

## 2020-02-03 NOTE — Telephone Encounter (Signed)
What is the name of the medication? atorvastatin (LIPITOR) 20 MG tablet [id number] and olmesartan-hydrochlorothiazide (BENICAR HCT) 20-12.5 MG tablet [id number]    Have you contacted your pharmacy to request a refill? Yes she has. She needs an ov. She has an upcoming appointment with Carlota Raspberry on 02/12/20.please advise at [phone].  Which pharmacy would you like this sent to? Pharmacy  [facility] [phone] [address], [place] [address]  [address] [place] [address]  Phone:  [phone] Fax:  [phone]       Patient notified that their request is being sent to the clinical staff for review and that they should receive a call once it is complete. If they do not receive a call within 72 hours they can check with their pharmacy or our office.

## 2020-02-04 ENCOUNTER — Other Ambulatory Visit: Payer: Self-pay

## 2020-02-04 DIAGNOSIS — M1612 Unilateral primary osteoarthritis, left hip: Secondary | ICD-10-CM | POA: Diagnosis not present

## 2020-02-04 DIAGNOSIS — E785 Hyperlipidemia, unspecified: Secondary | ICD-10-CM

## 2020-02-04 DIAGNOSIS — I10 Essential (primary) hypertension: Secondary | ICD-10-CM

## 2020-02-04 DIAGNOSIS — M25552 Pain in left hip: Secondary | ICD-10-CM | POA: Diagnosis not present

## 2020-02-04 MED ORDER — ATORVASTATIN CALCIUM 20 MG PO TABS: 20.0000 mg | ORAL_TABLET | Freq: Every day | ORAL | 5 refills | Status: DC

## 2020-02-04 MED ORDER — OLMESARTAN MEDOXOMIL-HCTZ 20-12.5 MG PO TABS: 1.0000 | ORAL_TABLET | Freq: Every day | ORAL | 0 refills | Status: DC

## 2020-02-05 ENCOUNTER — Other Ambulatory Visit (HOSPITAL_COMMUNITY): Payer: Self-pay | Admitting: General Surgery

## 2020-02-05 DIAGNOSIS — D3A8 Other benign neuroendocrine tumors: Secondary | ICD-10-CM

## 2020-02-12 ENCOUNTER — Ambulatory Visit (INDEPENDENT_AMBULATORY_CARE_PROVIDER_SITE_OTHER): Payer: Medicare Other | Admitting: Family Medicine

## 2020-02-12 ENCOUNTER — Ambulatory Visit (INDEPENDENT_AMBULATORY_CARE_PROVIDER_SITE_OTHER): Payer: Medicare Other

## 2020-02-12 ENCOUNTER — Encounter: Payer: Self-pay | Admitting: Family Medicine

## 2020-02-12 ENCOUNTER — Other Ambulatory Visit: Payer: Self-pay

## 2020-02-12 VITALS — BP 153/77 | HR 91 | Temp 97.8°F | Ht 64.0 in | Wt 180.0 lb

## 2020-02-12 DIAGNOSIS — I10 Essential (primary) hypertension: Secondary | ICD-10-CM

## 2020-02-12 DIAGNOSIS — R7303 Prediabetes: Secondary | ICD-10-CM | POA: Diagnosis not present

## 2020-02-12 DIAGNOSIS — Z01818 Encounter for other preprocedural examination: Secondary | ICD-10-CM

## 2020-02-12 DIAGNOSIS — J4520 Mild intermittent asthma, uncomplicated: Secondary | ICD-10-CM | POA: Diagnosis not present

## 2020-02-12 DIAGNOSIS — E785 Hyperlipidemia, unspecified: Secondary | ICD-10-CM

## 2020-02-12 MED ORDER — FENOFIBRATE 145 MG PO TABS: ORAL_TABLET | ORAL | 5 refills | Status: DC

## 2020-02-12 MED ORDER — ADVAIR HFA 115-21 MCG/ACT IN AERO: 2.0000 | INHALATION_SPRAY | Freq: Two times a day (BID) | RESPIRATORY_TRACT | 5 refills | Status: DC

## 2020-02-12 MED ORDER — OLMESARTAN MEDOXOMIL-HCTZ 20-12.5 MG PO TABS: 1.0000 | ORAL_TABLET | Freq: Every day | ORAL | 5 refills | Status: DC

## 2020-02-12 MED ORDER — ATORVASTATIN CALCIUM 20 MG PO TABS: 20.0000 mg | ORAL_TABLET | Freq: Every day | ORAL | 5 refills | Status: DC

## 2020-02-12 NOTE — Progress Notes (Signed)
Subjective:  Patient ID: [name], female    DOB: 08-15-1946  Age: 74 y.o. MRN: [id number]  CC:  Chief Complaint  Patient presents with  . Hypertension    pt reports no issues with BP since last visit. pt don't check her BP at home. pt hasn't had her BP medication until today pt states thats probably why her BP is elevated. pt reports no physical symptoms of this condition and her current medication works well for her with no side effects.  . Hyperlipidemia    pt reports no physical symptoms of this condition and her current medication works well for her with no side effects.    HPI [name] presents for  Med follow up. Last visit with me in October 2020.   Planning on left hip replacement with Dr. Berenice Primas. Will need surgical clearance.   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 GI Dr. Paulita Fujita. Planned surgery by Dr. Barry Dienes. Pain in hip was worse - plans on hip surgery before pancreas surgery.   Preop: Intermediate risk surgery.  No CP with exercise.  Able to walk up 2 flights of stairs without CP or dyspnea. Over 4 mets of activity.  No hx of CVA/TIA.  No hx of ischemic heart disease or CHF.  Goldman/RSRI scale score of 0.  No hx of OSA.  BMI 30.9 Nonsmoker.  Lab Results  Component Value Date   CREATININE 0.82 07/01/2019    Prediabetes: Wt Readings from Last 3 Encounters:  02/12/20 180 lb (81.6 kg)  01/05/20 179 lb 14.3 oz (81.6 kg)  10/13/19 167 lb 6.4 oz (75.9 kg)   Lab Results  Component Value Date   HGBA1C 5.8 (H) 07/01/2019    Hypertension: Takes Benicar HCT 20/12.5 mg. Off benicar for a few days, then restarted this am.  Usual Home readings: unknown.  BP Readings from Last 3 Encounters:  02/12/20 (!) 153/77  01/05/20 (!) 145/67  10/13/19 112/68   Lab Results  Component Value Date   CREATININE 0.82 07/01/2019   Hyperlipidemia: Lipitor 20 mg daily. No new side effects.   Lab Results  Component Value Date   CHOL 165 07/01/2019   HDL  58 07/01/2019   LDLCALC 82 07/01/2019   TRIG 148 07/01/2019   CHOLHDL 2.8 07/01/2019   Lab Results  Component Value Date   ALT 15 07/01/2019   AST 18 07/01/2019   ALKPHOS 65 07/01/2019   BILITOT 0.5 07/01/2019   Cough variant asthma/mild intermittent asthma Continue on Advair 115/21 BID, with Pro Air as needed. Not needing albuterol inhaler.   History Patient Active Problem List   Diagnosis Date Noted  . Essential hypertension 08/19/2017  . Hyperlipidemia 08/19/2017  . Cough variant asthma 04/04/2016  . Upper airway cough syndrome 04/04/2016  . 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 Arthritis   . GERD (gastroesophageal reflux disease)    at times  . Hyperlipidemia   . Hypertension   . Peripheral vascular disease ([place])    has spider veins  . Pneumothorax, traumatic    RIGHT; associated with multiple rib fractures after a fall   Past Surgical History:  Procedure Laterality Date  . ABDOMINAL HYSTERECTOMY  1982  . AUGMENTATION MAMMAPLASTY Bilateral    Patient has bilateral implants   . 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 BREAST SURGERY    . ESOPHAGOGASTRODUODENOSCOPY (EGD) WITH PROPOFOL N/A 01/05/2020   Procedure: ESOPHAGOGASTRODUODENOSCOPY (EGD) WITH  PROPOFOL;  Surgeon: Arta Silence, MD;  Location: [facility];  Service: Endoscopy;  Laterality: N/A;  . EUS N/A 01/05/2020   Procedure: UPPER ENDOSCOPIC ULTRASOUND (EUS) LINEAR;  Surgeon: Arta Silence, MD;  Location: [facility];  Service: Endoscopy;  Laterality: N/A;  . FINE NEEDLE ASPIRATION N/A 01/05/2020   Procedure: FINE NEEDLE ASPIRATION (FNA) LINEAR;  Surgeon: Arta Silence, MD;  Location: [facility];  Service: Endoscopy;  Laterality: N/A;  . JOINT REPLACEMENT  2009   right knee  . JOINT REPLACEMENT  2011   left knee  . MASTECTOMY  2008   right breast  . REDUCTION MAMMAPLASTY Left    Patient had lift with implant on Left side   . SHOULDER ARTHROSCOPY  DISTAL CLAVICLE EXCISION AND OPEN ROTATOR CUFF REPAIR  2011  . TOTAL SHOULDER ARTHROPLASTY  03/14/2012   Procedure: TOTAL SHOULDER ARTHROPLASTY;  Surgeon: Alta Corning, MD;  Location: [facility];  Service: Orthopedics;  Laterality: Left;   Allergies  Allergen Reactions  . Dilaudid [Hydromorphone Hcl] Itching  . Morphine And Related Nausea And Vomiting   Prior to Admission medications   Medication Sig Start Date End Date Taking? Authorizing Provider  acetaminophen (TYLENOL) 325 MG tablet Take 650 mg by mouth every 6 (six) hours as needed for mild pain or moderate pain.    Yes [provider]  albuterol (PROAIR HFA) 108 (90 Base) MCG/ACT inhaler Inhale 2 puffs into the lungs every 6 (six) hours as needed for wheezing or shortness of breath. 07/01/19  Yes [name], MD  atorvastatin (LIPITOR) 20 MG tablet Take 1 tablet (20 mg total) by mouth daily at 6 PM. 02/04/20  Yes [name], MD  Dextromethorphan-Guaifenesin 20-400 MG TABS Take 1 tablet by mouth 2 (two) times daily as needed. Mucus Relief   Yes [provider]  diphenhydrAMINE (SOMINEX) 25 MG tablet Take 50 mg by mouth at bedtime.    Yes [provider]  fenofibrate (TRICOR) 145 MG tablet TAKE 1 TABLET(145 MG) BY MOUTH DAILY Patient taking differently: Take 145 mg by mouth daily.  07/01/19  Yes [name], MD  fluticasone-salmeterol (ADVAIR HFA) [phone] MCG/ACT inhaler Inhale 2 puffs into the lungs 2 (two) times daily. 07/01/19  Yes [name], MD  gabapentin (NEURONTIN) 300 MG capsule 1 po BID prn. Patient taking differently: Take 300 mg by mouth 3 (three) times daily.  07/01/19  Yes [name], MD  ibuprofen (ADVIL,MOTRIN) 100 MG tablet Take 100 mg by mouth every 6 (six) hours as needed for pain.    Yes [provider]  meclizine (ANTIVERT) 25 MG tablet Take 1 tablet (25 mg total) by mouth 3 (three) times daily as needed for dizziness. 04/14/18  Yes [name], MD  Multiple  Vitamin (MULTIVITAMIN) capsule Take 1 capsule by mouth daily.   Yes [provider]  olmesartan-hydrochlorothiazide (BENICAR HCT) 20-12.5 MG tablet Take 1 tablet by mouth daily. 02/04/20  Yes [name], MD  oxymetazoline (AFRIN) 0.05 % nasal spray Place 1 spray into both nostrils at bedtime as needed for congestion.    Yes [provider]  Respiratory Therapy Supplies (FLUTTER) DEVI 1 each by Does not apply route 2 (two) times daily. 07/15/19  Yes Noemi Chapel P, DO  tiZANidine (ZANAFLEX) 2 MG tablet Take 2 mg by mouth 3 (three) times daily.  12/25/19  Yes [provider]   Social History   Socioeconomic History  . Marital status: Divorced    Spouse  name: Not on file  . Number of children: 2  . Years of education: Not on file  . Highest education level: Not on file  Occupational History  . Occupation: Retired  . Occupation: Glass blower/designer    Comment: Arts development officer  Tobacco Use  . Smoking status: Never Smoker  . Smokeless tobacco: Never Used  Substance and Sexual Activity  . Alcohol use: No    Alcohol/week: 0.0 standard drinks  . Drug use: No  . Sexual activity: Not on file  Other Topics Concern  . Not on file  Social History Narrative  . Not on file   Social Determinants of Health   Financial Resource Strain:   . Difficulty of Paying Living Expenses:   Food Insecurity:   . Worried About Charity fundraiser in the Last Year:   . Arboriculturist in the Last Year:   Transportation Needs:   . Film/video editor (Medical):   Marland Kitchen Lack of Transportation (Non-Medical):   Physical Activity:   . Days of Exercise per Week:   . Minutes of Exercise per Session:   Stress:   . Feeling of Stress :   Social Connections:   . Frequency of Communication with Friends and Family:   . Frequency of Social Gatherings with Friends and Family:   . Attends Religious Services:   . Active Member of Clubs or Organizations:   . Attends Theatre manager Meetings:   Marland Kitchen Marital Status:   Intimate Partner Violence:   . Fear of Current or Ex-Partner:   . Emotionally Abused:   Marland Kitchen Physically Abused:   . Sexually Abused:     Review of Systems  Constitutional: Negative for fatigue and unexpected weight change.  Respiratory: Negative for chest tightness and shortness of breath.   Cardiovascular: Negative for chest pain, palpitations and leg swelling.  Gastrointestinal: Negative for abdominal pain and blood in stool.  Neurological: Negative for dizziness, syncope, light-headedness and headaches.     Objective:   Vitals:   02/12/20 1343  BP: (!) 153/77  Pulse: 91  Temp: 97.8 F (36.6 C)  TempSrc: Temporal  SpO2: 94%  Weight: 180 lb (81.6 kg)  Height: 5\' 4"  (1.626 m)     Physical Exam Vitals reviewed.  Constitutional:      Appearance: She is well-developed.  HENT:     Head: Normocephalic and atraumatic.  Eyes:     Conjunctiva/sclera: Conjunctivae normal.     Pupils: Pupils are equal, round, and reactive to light.  Neck:     Vascular: No carotid bruit.  Cardiovascular:     Rate and Rhythm: Normal rate and regular rhythm.     Heart sounds: Normal heart sounds.  Pulmonary:     Effort: Pulmonary effort is normal.     Breath sounds: Normal breath sounds.  Abdominal:     Palpations: Abdomen is soft. There is no pulsatile mass.     Tenderness: There is no abdominal tenderness.  Skin:    General: Skin is warm and dry.  Neurological:     Mental Status: She is alert and oriented to person, place, and time.  Psychiatric:        Behavior: Behavior normal.    EKG: Sinus rhythm, some artifact but no acute findings.  DG Chest 2 View  Result Date: 02/12/2020 CLINICAL DATA:  Preop. EXAM: CHEST - 2 VIEW COMPARISON:  None. FINDINGS: The heart size and mediastinal contours are within normal limits. Both lungs are  clear. Probable old right rib fractures are noted. IMPRESSION: No active cardiopulmonary disease.  Electronically Signed   By: Marijo Conception M.D.   On: 02/12/2020 15:05      Assessment & Plan:  [name] is a 74 y.o. female . Preoperative evaluation to rule out surgical contraindication - Plan: Comprehensive metabolic panel, CBC, EKG [id number], DG Chest 2 View  -Appears to be acceptable risk for intermediate risk surgery above.  No apparent increase of major adverse cardiac event.  Check labs.  Will complete paperwork for surgeon.  Hyperlipidemia, unspecified hyperlipidemia type - Plan: Lipid panel, fenofibrate (TRICOR) 145 MG tablet, atorvastatin (LIPITOR) 20 MG tablet  -Tolerating combination of TriCor and Lipitor without myalgias.  Continue same, labs pending  Essential hypertension - Plan: EKG 12-Lead, DG Chest 2 View, olmesartan-hydrochlorothiazide (BENICAR HCT) 20-12.5 MG tablet  -Recently off meds.  Plan on nurse visit next 2 weeks to determine control, continue same dose Benicar HCT for now.  Mild intermittent asthma without complication - Plan: CBC, EKG 12-Lead, fluticasone-salmeterol (ADVAIR HFA) 115-21 MCG/ACT inhaler  -Stable with Advair.  Continue same  Prediabetes - Plan: Hemoglobin A1c  -Check A1c, diet/exercise discussed.  Meds ordered this encounter  Medications  . fenofibrate (TRICOR) 145 MG tablet    Sig: TAKE 1 TABLET(145 MG) BY MOUTH DAILY    Dispense:  30 tablet    Refill:  5  . olmesartan-hydrochlorothiazide (BENICAR HCT) 20-12.5 MG tablet    Sig: Take 1 tablet by mouth daily.    Dispense:  30 tablet    Refill:  5  . fluticasone-salmeterol (ADVAIR HFA) 115-21 MCG/ACT inhaler    Sig: Inhale 2 puffs into the lungs 2 (two) times daily.    Dispense:  12 g    Refill:  5  . atorvastatin (LIPITOR) 20 MG tablet    Sig: Take 1 tablet (20 mg total) by mouth daily at 6 PM.    Dispense:  30 tablet    Refill:  5   Patient Instructions    Nurse visit for repeat blood pressure in next 2 weeks. If high, can adjust meds.  No change in other medications for  now.  I will watch for the paperwork from orthopedics, but based on what I am seeing at this time I do not see any specific concerns with surgery.    If you have lab work done today you will be contacted with your lab results within the next 2 weeks.  If you have not heard from [place] then please contact us. The fastest way to get your results is to register for My Chart.   IF you received an x-ray today, you will receive an invoice from [facility]. Please contact [facility] at [phone] with questions or concerns regarding your invoice.   IF you received labwork today, you will receive an invoice from [place]. Please contact [facility] at [phone] with questions or concerns regarding your invoice.   Our billing staff will not be able to assist you with questions regarding bills from these companies.  You will be contacted with the lab results as soon as they are available. The fastest way to get your results is to activate your My Chart account. Instructions are located on the last page of this paperwork. If you have not heard from [place] regarding the results in 2 weeks, please contact this office.         Signed, Merri Ray, MD [facility]

## 2020-02-12 NOTE — Patient Instructions (Addendum)
  Nurse visit for repeat blood pressure in next 2 weeks. If high, can adjust meds.  No change in other medications for now.  I will watch for the paperwork from orthopedics, but based on what I am seeing at this time I do not see any specific concerns with surgery.    If you have lab work done today you will be contacted with your lab results within the next 2 weeks.  If you have not heard from [place] then please contact us. The fastest way to get your results is to register for My Chart.   IF you received an x-ray today, you will receive an invoice from [facility]. Please contact [facility] at [phone] with questions or concerns regarding your invoice.   IF you received labwork today, you will receive an invoice from [facility]. Please contact [facility] at [phone] with questions or concerns regarding your invoice.   Our billing staff will not be able to assist you with questions regarding bills from these companies.  You will be contacted with the lab results as soon as they are available. The fastest way to get your results is to activate your My Chart account. Instructions are located on the last page of this paperwork. If you have not heard from [place] regarding the results in 2 weeks, please contact this office.

## 2020-02-13 LAB — COMPREHENSIVE METABOLIC PANEL
ALT: 13 IU/L (ref 0–32)
AST: 19 IU/L (ref 0–40)
Albumin/Globulin Ratio: 2.7 — ABNORMAL HIGH (ref 1.2–2.2)
Albumin: 4.9 g/dL — ABNORMAL HIGH (ref 3.7–4.7)
Alkaline Phosphatase: 70 IU/L (ref 39–117)
BUN/Creatinine Ratio: 19 (ref 12–28)
BUN: 16 mg/dL (ref 8–27)
Bilirubin Total: 0.3 mg/dL (ref 0.0–1.2)
CO2: 23 mmol/L (ref 20–29)
Calcium: 10 mg/dL (ref 8.7–10.3)
Chloride: 105 mmol/L (ref 96–106)
Creatinine, Ser: 0.85 mg/dL (ref 0.57–1.00)
GFR calc Af Amer: 78 mL/min/{1.73_m2} (ref >59)
GFR calc non Af Amer: 68 mL/min/{1.73_m2} (ref >59)
Globulin, Total: 1.8 g/dL (ref 1.5–4.5)
Glucose: 142 mg/dL — ABNORMAL HIGH (ref 65–99)
Potassium: 4.3 mmol/L (ref 3.5–5.2)
Sodium: 142 mmol/L (ref 134–144)
Total Protein: 6.7 g/dL (ref 6.0–8.5)

## 2020-02-13 LAB — CBC
Hematocrit: 36.6 % (ref 34.0–46.6)
Hemoglobin: 12.2 g/dL (ref 11.1–15.9)
MCH: 32.3 pg (ref 26.6–33.0)
MCHC: 33.3 g/dL (ref 31.5–35.7)
MCV: 97 fL (ref 79–97)
Platelets: 337 10*3/uL (ref 150–450)
RBC: 3.78 x10E6/uL (ref 3.77–5.28)
RDW: 11.8 % (ref 11.7–15.4)
WBC: 5.8 10*3/uL (ref 3.4–10.8)

## 2020-02-13 LAB — LIPID PANEL
Chol/HDL Ratio: 2.9 ratio (ref 0.0–4.4)
Cholesterol, Total: 218 mg/dL — ABNORMAL HIGH (ref 100–199)
HDL: 76 mg/dL (ref >39)
LDL Chol Calc (NIH): 127 mg/dL — ABNORMAL HIGH (ref 0–99)
Triglycerides: 88 mg/dL (ref 0–149)
VLDL Cholesterol Cal: 15 mg/dL (ref 5–40)

## 2020-02-13 LAB — HEMOGLOBIN A1C
Est. average glucose Bld gHb Est-mCnc: 128 mg/dL
Hgb A1c MFr Bld: 6.1 % — ABNORMAL HIGH (ref 4.8–5.6)

## 2020-02-14 DIAGNOSIS — R7303 Prediabetes: Secondary | ICD-10-CM | POA: Insufficient documentation

## 2020-02-15 ENCOUNTER — Other Ambulatory Visit (HOSPITAL_COMMUNITY): Payer: Self-pay | Admitting: Physician Assistant

## 2020-02-15 ENCOUNTER — Other Ambulatory Visit: Payer: Self-pay

## 2020-02-15 ENCOUNTER — Ambulatory Visit (HOSPITAL_COMMUNITY)
Admission: RE | Admit: 2020-02-15 | Discharge: 2020-02-15 | Disposition: A | Discharge status: Home / Self Care | Payer: Medicare Other | Source: Ambulatory Visit | Attending: General Surgery | Admitting: General Surgery

## 2020-02-15 DIAGNOSIS — C254 Malignant neoplasm of endocrine pancreas: Secondary | ICD-10-CM | POA: Diagnosis not present

## 2020-02-15 DIAGNOSIS — D3A8 Other benign neuroendocrine tumors: Secondary | ICD-10-CM | POA: Insufficient documentation

## 2020-02-15 MED ORDER — GALLIUM GA 68 DOTATATE IV KIT
4.5200 | PACK | Freq: Once | INTRAVENOUS | Status: AC | PRN
  Administered 2020-02-15: 4.52 via INTRAVENOUS

## 2020-02-22 NOTE — Telephone Encounter (Signed)
Pt left message stating that surgical clearance will be faxed to pcp

## 2020-02-24 ENCOUNTER — Telehealth: Payer: Self-pay | Admitting: Emergency Medicine

## 2020-02-24 ENCOUNTER — Telehealth: Payer: Self-pay | Admitting: Family Medicine

## 2020-02-24 NOTE — Telephone Encounter (Signed)
This has already been sent in to the orthopedic surgeon. Confirmation of successful fax transmittal was received

## 2020-02-24 NOTE — Telephone Encounter (Signed)
Pt called she need her Paper work to be Fills up for her Hip Rep A soon is possible.please Advice

## 2020-02-24 NOTE — Telephone Encounter (Signed)
Surgical clearance form has been filled out and faxed back to [place] along with office notes. Fax went through successful and copy has been sent to be scanned [place]

## 2020-02-25 ENCOUNTER — Telehealth: Payer: Self-pay | Admitting: Family Medicine

## 2020-02-25 DIAGNOSIS — Z01818 Encounter for other preprocedural examination: Secondary | ICD-10-CM

## 2020-02-25 NOTE — Telephone Encounter (Signed)
Noted  

## 2020-02-25 NOTE — Telephone Encounter (Signed)
Gave fax to [facility] for her to give to Dr. Hervey Ard for pts surgery clearance.

## 2020-02-25 NOTE — Telephone Encounter (Signed)
[name] with [facility] calling to verify we received lab request for this pt/ was wanting Dr.Greene to order labs / pt is coming in tomorrow 05.14.2021 for BP check . Please call [name] at [phone] when we  are receive fax

## 2020-02-26 ENCOUNTER — Ambulatory Visit (INDEPENDENT_AMBULATORY_CARE_PROVIDER_SITE_OTHER): Payer: Medicare Other | Admitting: Family Medicine

## 2020-02-26 ENCOUNTER — Other Ambulatory Visit: Payer: Self-pay

## 2020-02-26 DIAGNOSIS — Z01818 Encounter for other preprocedural examination: Secondary | ICD-10-CM | POA: Diagnosis not present

## 2020-02-26 NOTE — Progress Notes (Signed)
Labs only visit

## 2020-02-27 ENCOUNTER — Encounter: Payer: Self-pay | Admitting: Family Medicine

## 2020-02-27 LAB — PROTIME-INR
INR: 1 (ref 0.9–1.2)
Prothrombin Time: 10.8 s (ref 9.1–12.0)

## 2020-02-27 NOTE — Progress Notes (Signed)
Lab only visit - pt not seen.

## 2020-03-08 NOTE — Telephone Encounter (Signed)
-----   Message from [name], MD sent at 02/27/2020  8:44 AM EDT ----- May 14th nurse visit was supposed to be for labs and blood pressure assessment, see notes on that visit.  I do not see where blood pressure was obtained.  If that was not done, then we will need to have her come back and check blood pressure.  If so, please apologize to her for the inconvenience.

## 2020-03-08 NOTE — Telephone Encounter (Signed)
Spoke with patient she said they did take her bp on the 02/27/20 lab visit she is not sure but she think her bp on that day was 114/74 but she is not 100% sure so I asked her if it was possible for her to come into the office in the next few days to have bp checked once again. Patient stated she will stop by in the next day or two

## 2020-03-09 ENCOUNTER — Other Ambulatory Visit: Payer: Self-pay

## 2020-03-09 ENCOUNTER — Ambulatory Visit (INDEPENDENT_AMBULATORY_CARE_PROVIDER_SITE_OTHER): Payer: Medicare Other | Admitting: Family Medicine

## 2020-03-09 VITALS — BP 122/76 | HR 80

## 2020-03-09 DIAGNOSIS — I10 Essential (primary) hypertension: Secondary | ICD-10-CM

## 2020-03-09 NOTE — Progress Notes (Signed)
Patient here for blood pressure check only.

## 2020-03-12 ENCOUNTER — Other Ambulatory Visit (HOSPITAL_COMMUNITY): Payer: Medicare Other

## 2020-03-12 ENCOUNTER — Encounter (HOSPITAL_BASED_OUTPATIENT_CLINIC_OR_DEPARTMENT_OTHER): Payer: Self-pay | Admitting: Emergency Medicine

## 2020-03-12 ENCOUNTER — Other Ambulatory Visit: Payer: Self-pay

## 2020-03-12 ENCOUNTER — Emergency Department (HOSPITAL_BASED_OUTPATIENT_CLINIC_OR_DEPARTMENT_OTHER)
Admission: EM | Admit: 2020-03-12 | Discharge: 2020-03-12 | Disposition: A | Discharge status: Home / Self Care | Payer: Medicare Other | Attending: Emergency Medicine | Admitting: Emergency Medicine

## 2020-03-12 DIAGNOSIS — M25511 Pain in right shoulder: Secondary | ICD-10-CM | POA: Diagnosis not present

## 2020-03-12 DIAGNOSIS — Z96653 Presence of artificial knee joint, bilateral: Secondary | ICD-10-CM | POA: Diagnosis not present

## 2020-03-12 DIAGNOSIS — Z96612 Presence of left artificial shoulder joint: Secondary | ICD-10-CM | POA: Insufficient documentation

## 2020-03-12 DIAGNOSIS — I10 Essential (primary) hypertension: Secondary | ICD-10-CM | POA: Diagnosis not present

## 2020-03-12 MED ORDER — HYDROCODONE-ACETAMINOPHEN 5-325 MG PO TABS: 1.0000 | ORAL_TABLET | Freq: Four times a day (QID) | ORAL | 0 refills | Status: DC | PRN

## 2020-03-12 NOTE — ED Triage Notes (Signed)
R arm pain radiating into chest and back since Thursday.

## 2020-03-12 NOTE — ED Notes (Signed)
ED Provider [place] MD at bedside.

## 2020-03-12 NOTE — ED Provider Notes (Signed)
[facility] EMERGENCY DEPARTMENT Provider Note   CSN: [id number] Arrival date & time: 03/12/20  1545     History Chief Complaint  Patient presents with  . Arm Pain    [name] is a 74 y.o. female.  Patient is a 73 year old female who presents with left shoulder pain.  She has a history of severe arthritis and has degenerative changes with rotator cuff injuries in the left shoulder and is supposed to be having at some point a reverse shoulder replacement.  She said over the last 2 days she has had worsening pain to her left shoulder.  It radiates down the posterior aspect of her left upper arm down to the mid arm.  No swelling of the arm.  She says it radiates over into the chest area but no other chest type pain.  Is worse with any movement of the shoulder in certain positions of the arm.  She has no numbness or weakness to the arm.  No known injuries although she says she has been cleaning her house trying to get it ready for showing and might of overdone it.  She has been taking her Tylenol ibuprofen and gabapentin without improvement in symptoms.  No fevers.  No shortness of breath.  No exertional symptoms.  No pleuritic symptoms.        Past Medical History:  Diagnosis Date  . Arthritis   . GERD (gastroesophageal reflux disease)    at times  . Hyperlipidemia   . Hypertension   . Peripheral vascular disease ([place])    has spider veins  . Pneumothorax, traumatic    RIGHT; associated with multiple rib fractures after a fall    Patient Active Problem List   Diagnosis Date Noted  . Prediabetes 02/14/2020  . Essential hypertension 08/19/2017  . Hyperlipidemia 08/19/2017  . Cough variant asthma 04/04/2016  . Upper airway cough syndrome 04/04/2016  . 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 ABDOMINAL HYSTERECTOMY  1982  . AUGMENTATION MAMMAPLASTY Bilateral    Patient has bilateral implants   .  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 BREAST SURGERY    . ESOPHAGOGASTRODUODENOSCOPY (EGD) WITH PROPOFOL N/A 01/05/2020   Procedure: ESOPHAGOGASTRODUODENOSCOPY (EGD) WITH PROPOFOL;  Surgeon: Arta Silence, MD;  Location: [facility];  Service: Endoscopy;  Laterality: N/A;  . EUS N/A 01/05/2020   Procedure: UPPER ENDOSCOPIC ULTRASOUND (EUS) LINEAR;  Surgeon: Arta Silence, MD;  Location: [facility];  Service: Endoscopy;  Laterality: N/A;  . FINE NEEDLE ASPIRATION N/A 01/05/2020   Procedure: FINE NEEDLE ASPIRATION (FNA) LINEAR;  Surgeon: Arta Silence, MD;  Location: [facility];  Service: Endoscopy;  Laterality: N/A;  . JOINT REPLACEMENT  2009   right knee  . JOINT REPLACEMENT  2011   left knee  . MASTECTOMY  2008   right breast  . REDUCTION MAMMAPLASTY Left    Patient had lift with implant on Left side   . SHOULDER ARTHROSCOPY DISTAL CLAVICLE EXCISION AND OPEN ROTATOR CUFF REPAIR  2011  . TOTAL SHOULDER ARTHROPLASTY  03/14/2012   Procedure: TOTAL SHOULDER ARTHROPLASTY;  Surgeon: Alta Corning, MD;  Location: [facility];  Service: Orthopedics;  Laterality: Left;     OB History   No obstetric history on file.     Family History  Problem Relation Age of Onset  . Breast cancer Sister   . Rheum arthritis Sister   . Breast cancer Paternal  Grandmother   . Kidney Stones Mother   . CVA Mother   . Hypertension Father   . Anesthesia problems Neg Hx   . Hypotension Neg Hx   . Malignant hyperthermia Neg Hx   . Pseudochol deficiency Neg Hx     Social History   Tobacco Use  . Smoking status: Never Smoker  . Smokeless tobacco: Never Used  Substance Use Topics  . Alcohol use: No    Alcohol/week: 0.0 standard drinks  . Drug use: No    Home Medications Prior to Admission medications   Medication Sig Start Date End Date Taking? Authorizing Provider  acetaminophen (TYLENOL) 325 MG tablet Take 650 mg by mouth every 6 (six) hours as needed for mild pain or moderate pain.      [provider]  albuterol (PROAIR HFA) 108 (90 Base) MCG/ACT inhaler Inhale 2 puffs into the lungs every 6 (six) hours as needed for wheezing or shortness of breath. 07/01/19   [name], MD  atorvastatin (LIPITOR) 20 MG tablet Take 1 tablet (20 mg total) by mouth daily at 6 PM. 02/12/20   [name], MD  Dextromethorphan-Guaifenesin 20-400 MG TABS Take 1 tablet by mouth 2 (two) times daily as needed. Mucus Relief    [provider]  diphenhydrAMINE (SOMINEX) 25 MG tablet Take 50 mg by mouth at bedtime.     [provider]  fenofibrate (TRICOR) 145 MG tablet TAKE 1 TABLET(145 MG) BY MOUTH DAILY 02/12/20   [name], MD  fluticasone-salmeterol (ADVAIR HFA) [phone] MCG/ACT inhaler Inhale 2 puffs into the lungs 2 (two) times daily. 02/12/20   [name], MD  gabapentin (NEURONTIN) 300 MG capsule 1 po BID prn. Patient taking differently: Take 300 mg by mouth 3 (three) times daily.  07/01/19   [name], MD  HYDROcodone-acetaminophen (NORCO/VICODIN) 5-325 MG tablet Take 1-2 tablets by mouth every 6 (six) hours as needed. 03/12/20   Malvin Johns, MD  ibuprofen (ADVIL,MOTRIN) 100 MG tablet Take 100 mg by mouth every 6 (six) hours as needed for pain.     [provider]  meclizine (ANTIVERT) 25 MG tablet Take 1 tablet (25 mg total) by mouth 3 (three) times daily as needed for dizziness. 04/14/18   [name], MD  Multiple Vitamin (MULTIVITAMIN) capsule Take 1 capsule by mouth daily.    [provider]  olmesartan-hydrochlorothiazide (BENICAR HCT) 20-12.5 MG tablet Take 1 tablet by mouth daily. 02/12/20   [name], MD  oxymetazoline (AFRIN) 0.05 % nasal spray Place 1 spray into both nostrils at bedtime as needed for congestion.     [provider]  Respiratory Therapy Supplies (FLUTTER) DEVI 1 each by Does not apply route 2 (two) times daily. 07/15/19   Julian Hy, DO  tiZANidine (ZANAFLEX) 2 MG tablet Take  2 mg by mouth 3 (three) times daily.  12/25/19   [provider]    Allergies    Dilaudid [hydromorphone hcl] and Morphine and related  Review of Systems   Review of Systems  Constitutional: Negative for fever.  Respiratory: Negative for chest tightness and shortness of breath.   Cardiovascular: Negative for chest pain.  Gastrointestinal: Negative for nausea and vomiting.  Musculoskeletal: Positive for arthralgias. Negative for back pain, joint swelling and neck pain.  Skin: Negative for wound.  Neurological: Negative for weakness, numbness and headaches.    Physical Exam Updated Vital Signs BP (!) 172/83 (BP Location: Left Arm)   Pulse  73   Temp 98.6 F (37 C) (Oral)   Resp 20   Ht 5\' 4"  (1.626 m)   Wt 81.6 kg   SpO2 99%   BMI 30.90 kg/m   Physical Exam Constitutional:      Appearance: She is well-developed.  HENT:     Head: Normocephalic and atraumatic.  Cardiovascular:     Rate and Rhythm: Normal rate.  Pulmonary:     Effort: Pulmonary effort is normal.  Musculoskeletal:        General: Tenderness present.     Cervical back: Normal range of motion and neck supple.     Comments: Patient has tenderness to the anterior left shoulder and some pain along the left trapezius muscle.  There is pain on abduction of the shoulder and external rotation of the shoulder.  There is some pain to the posterior shoulder and down to the posterior aspect of the upper arm.  No elbow pain.  No joint swelling noted.  No warmth or erythema.  No rashes.  Radial pulses are intact.  She has normal sensation and motor function distally.  Skin:    General: Skin is warm and dry.  Neurological:     Mental Status: She is alert and oriented to person, place, and time.     ED Results / Procedures / Treatments   Labs (all labs ordered are listed, but only abnormal results are displayed) Labs Reviewed - No data to display  EKG None  Radiology No results  found.  Procedures Procedures (including critical care time)  Medications Ordered in ED Medications - No data to display  ED Course  I have reviewed the triage vital signs and the nursing notes.  Pertinent labs & imaging results that were available during my care of the patient were reviewed by me and considered in my medical decision making (see chart for details).    MDM Rules/Calculators/A&P                      Patient presents with pain to her left shoulder.  It seems to be musculoskeletal in nature.  I do not see any suggestions of infection.  She does not have other symptoms that would be more concerning for ACS/aortic dissection or PE.  It is reproducible on palpation and worse with movement.  No neurologic deficits are noted.  She was given a short course of Vicodin for pain.  She was advised not to use it along with Tylenol and to use a stool softener along with the pain medicine.  She is going to call her orthopedist, Dr. Berenice Primas for follow-up.  Return precautions were given. Final Clinical Impression(s) / ED Diagnoses Final diagnoses:  Acute pain of right shoulder    Rx / DC Orders ED Discharge Orders         Ordered    HYDROcodone-acetaminophen (NORCO/VICODIN) 5-325 MG tablet  Every 6 hours PRN     03/12/20 1614           Malvin Johns, MD 03/12/20 1620

## 2020-03-16 ENCOUNTER — Inpatient Hospital Stay: Admit: 2020-03-16 | Payer: Medicare Other | Admitting: General Surgery

## 2020-03-16 SURGERY — PANCREATECTOMY, LAPAROSCOPIC
Anesthesia: General

## 2020-03-17 DIAGNOSIS — M25552 Pain in left hip: Secondary | ICD-10-CM | POA: Diagnosis not present

## 2020-03-17 DIAGNOSIS — M19011 Primary osteoarthritis, right shoulder: Secondary | ICD-10-CM | POA: Diagnosis not present

## 2020-03-17 DIAGNOSIS — M1612 Unilateral primary osteoarthritis, left hip: Secondary | ICD-10-CM | POA: Diagnosis not present

## 2020-03-17 DIAGNOSIS — M67911 Unspecified disorder of synovium and tendon, right shoulder: Secondary | ICD-10-CM | POA: Diagnosis not present

## 2020-03-25 DIAGNOSIS — M19011 Primary osteoarthritis, right shoulder: Secondary | ICD-10-CM | POA: Diagnosis not present

## 2020-04-07 ENCOUNTER — Telehealth: Payer: Self-pay | Admitting: *Deleted

## 2020-04-07 ENCOUNTER — Telehealth: Payer: Self-pay | Admitting: Family Medicine

## 2020-04-07 NOTE — Telephone Encounter (Signed)
Schedule AWV  7-9 after

## 2020-04-07 NOTE — Telephone Encounter (Signed)
Pt was returning office call to sch AWV. Please advise.

## 2020-04-25 ENCOUNTER — Ambulatory Visit (INDEPENDENT_AMBULATORY_CARE_PROVIDER_SITE_OTHER): Payer: Medicare Other | Admitting: Family Medicine

## 2020-04-25 VITALS — BP 122/76 | Ht 64.0 in | Wt 180.0 lb

## 2020-04-25 DIAGNOSIS — Z0000 Encounter for general adult medical examination without abnormal findings: Secondary | ICD-10-CM | POA: Diagnosis not present

## 2020-04-25 NOTE — Patient Instructions (Signed)
Thank you for taking time to come for your Medicare Wellness Visit. I appreciate your ongoing commitment to your health goals. Please review the following plan we discussed and let me know if I can assist you in the future.  Zubair Lofton LPN  Preventive Care 75 Years and Older, Female Preventive care refers to lifestyle choices and visits with your health care provider that can promote health and wellness. This includes:  A yearly physical exam. This is also called an annual well check.  Regular dental and eye exams.  Immunizations.  Screening for certain conditions.  Healthy lifestyle choices, such as diet and exercise. What can I expect for my preventive care visit? Physical exam Your health care provider will check:  Height and weight. These may be used to calculate body mass index (BMI), which is a measurement that tells if you are at a healthy weight.  Heart rate and blood pressure.  Your skin for abnormal spots. Counseling Your health care provider may ask you questions about:  Alcohol, tobacco, and drug use.  Emotional well-being.  Home and relationship well-being.  Sexual activity.  Eating habits.  History of falls.  Memory and ability to understand (cognition).  Work and work environment.  Pregnancy and menstrual history. What immunizations do I need?  Influenza (flu) vaccine  This is recommended every year. Tetanus, diphtheria, and pertussis (Tdap) vaccine  You may need a Td booster every 10 years. Varicella (chickenpox) vaccine  You may need this vaccine if you have not already been vaccinated. Zoster (shingles) vaccine  You may need this after age 60. Pneumococcal conjugate (PCV13) vaccine  One dose is recommended after age 65. Pneumococcal polysaccharide (PPSV23) vaccine  One dose is recommended after age 65. Measles, mumps, and rubella (MMR) vaccine  You may need at least one dose of MMR if you were born in 1957 or later. You may also  need a second dose. Meningococcal conjugate (MenACWY) vaccine  You may need this if you have certain conditions. Hepatitis A vaccine  You may need this if you have certain conditions or if you travel or work in places where you may be exposed to hepatitis A. Hepatitis B vaccine  You may need this if you have certain conditions or if you travel or work in places where you may be exposed to hepatitis B. Haemophilus influenzae type b (Hib) vaccine  You may need this if you have certain conditions. You may receive vaccines as individual doses or as more than one vaccine together in one shot (combination vaccines). Talk with your health care provider about the risks and benefits of combination vaccines. What tests do I need? Blood tests  Lipid and cholesterol levels. These may be checked every 5 years, or more frequently depending on your overall health.  Hepatitis C test.  Hepatitis B test. Screening  Lung cancer screening. You may have this screening every year starting at age 75 if you have a 30-pack-year history of smoking and currently smoke or have quit within the past 15 years.  Colorectal cancer screening. All adults should have this screening starting at age 75 and continuing until age 75. Your health care provider may recommend screening at age 45 if you are at increased risk. You will have tests every 1-10 years, depending on your results and the type of screening test.  Diabetes screening. This is done by checking your blood sugar (glucose) after you have not eaten for a while (fasting). You may have this done every 1-3   years.  Mammogram. This may be done every 1-2 years. Talk with your health care provider about how often you should have regular mammograms.  BRCA-related cancer screening. This may be done if you have a family history of breast, ovarian, tubal, or peritoneal cancers. Other tests  Sexually transmitted disease (STD) testing.  Bone density scan. This is done  to screen for osteoporosis. You may have this done starting at age 75. Follow these instructions at home: Eating and drinking  Eat a diet that includes fresh fruits and vegetables, whole grains, lean protein, and low-fat dairy products. Limit your intake of foods with high amounts of sugar, saturated fats, and salt.  Take vitamin and mineral supplements as recommended by your health care provider.  Do not drink alcohol if your health care provider tells you not to drink.  If you drink alcohol: ? Limit how much you have to 0-1 drink a day. ? Be aware of how much alcohol is in your drink. In the [place] one drink equals one 12 oz bottle of beer (355 mL), one 5 oz glass of wine (148 mL), or one 1 oz glass of hard liquor (44 mL). Lifestyle  Take daily care of your teeth and gums.  Stay active. Exercise for at least 30 minutes on 5 or more days each week.  Do not use any products that contain nicotine or tobacco, such as cigarettes, e-cigarettes, and chewing tobacco. If you need help quitting, ask your health care provider.  If you are sexually active, practice safe sex. Use a condom or other form of protection in order to prevent STIs (sexually transmitted infections).  Talk with your health care provider about taking a low-dose aspirin or statin. What's next?  Go to your health care provider once a year for a well check visit.  Ask your health care provider how often you should have your eyes and teeth checked.  Stay up to date on all vaccines. This information is not intended to replace advice given to you by your health care provider. Make sure you discuss any questions you have with your health care provider. Document Revised: 09/25/2018 Document Reviewed: 09/25/2018 Elsevier Patient Education  2020 [employer].

## 2020-04-25 NOTE — Progress Notes (Signed)
Presents today for [employer] Visit   Date of last exam: 03-09-2020  Interpreter used for this visit? No  I connected with  [name] on 04/25/20 by a telpehone application and verified that I am speaking with the correct person using two identifiers.   I discussed the limitations of evaluation and management by telemedicine. The patient expressed understanding and agreed to proceed.    Patient Care Team: [name], MD as PCP - General (Family Medicine)   Other items to address today:   Discussed immunizations Patient will bring card for COVID vaccination to her follow up on 10-29 1:40 Dr. Carlota Raspberry. Discussed Eye/Dental    Other Screening: Last screening for diabetes: 02/12/2020 Last lipid screening: 02-12-2020  ADVANCE DIRECTIVES: Discussed: yes On File: no Materials Provided: yes  Immunization status:  Immunization History  Administered Date(s) Administered  . Fluad Quad(high Dose 65+) 07/15/2019  . Influenza, High Dose Seasonal PF 13-Jul-1946, 08/13/2018  . Influenza-Unspecified 08/15/2016, 08/17/2017, 07/15/2019  . Meningococcal Polysaccharide 07/17/2016  . Pneumococcal Conjugate-13 09/13/2014  . Tdap 02/02/2014     Health Maintenance Due  Topic Date Due  . COVID-19 Vaccine (1) Never done     Functional Status Survey: Is the patient deaf or have difficulty hearing?: No Does the patient have difficulty seeing, even when wearing glasses/contacts?: No Does the patient have difficulty concentrating, remembering, or making decisions?: No Does the patient have difficulty walking or climbing stairs?: No Does the patient have difficulty dressing or bathing?: No Does the patient have difficulty doing errands alone such as visiting a doctor's office or shopping?: No   6CIT Screen 04/25/2020 07/01/2019 04/23/2019 04/14/2018  What Year? 0 points 0 points 0 points 0 points  What month? 0 points 0 points 0 points 0 points  What time? 0  points 0 points 0 points 0 points  Count back from 20 0 points 0 points - 0 points  Months in reverse 0 points 0 points 0 points 0 points  Repeat phrase 0 points 0 points 0 points 2 points  Total Score 0 0 - 2        Clinical Support from 04/25/2020 in [place] at [place]  AUDIT-C Score 0       Home Environment:    Lives on one story home No trouble climbing stairs No scattered rugs Yes grab bars Adequate lighting/ no clutter   Patient Active Problem List   Diagnosis Date Noted  . Prediabetes 02/14/2020  . Essential hypertension 08/19/2017  . Hyperlipidemia 08/19/2017  . Cough variant asthma 04/04/2016  . Upper airway cough syndrome 04/04/2016  . Osteoarthritis of left shoulder 03/24/2012     Past Medical History:  Diagnosis Date  . Arthritis   . GERD (gastroesophageal reflux disease)    at times  . Hyperlipidemia   . Hypertension   . Peripheral vascular disease ([place])    has spider veins  . Pneumothorax, traumatic    RIGHT; associated with multiple rib fractures after a fall     Past Surgical History:  Procedure Laterality Date  . ABDOMINAL HYSTERECTOMY  1982  . AUGMENTATION MAMMAPLASTY Bilateral    Patient has bilateral implants   . 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 BREAST SURGERY    . ESOPHAGOGASTRODUODENOSCOPY (EGD) WITH PROPOFOL N/A 01/05/2020   Procedure: ESOPHAGOGASTRODUODENOSCOPY (EGD) WITH PROPOFOL;  Surgeon: Arta Silence, MD;  Location: [facility];  Service: Endoscopy;  Laterality: N/A;  . EUS N/A 01/05/2020  Procedure: UPPER ENDOSCOPIC ULTRASOUND (EUS) LINEAR;  Surgeon: Arta Silence, MD;  Location: [facility];  Service: Endoscopy;  Laterality: N/A;  . FINE NEEDLE ASPIRATION N/A 01/05/2020   Procedure: FINE NEEDLE ASPIRATION (FNA) LINEAR;  Surgeon: Arta Silence, MD;  Location: [facility];  Service: Endoscopy;  Laterality: N/A;  . JOINT REPLACEMENT  2009   right knee  . JOINT REPLACEMENT  2011   left knee  . MASTECTOMY   2008   right breast  . REDUCTION MAMMAPLASTY Left    Patient had lift with implant on Left side   . SHOULDER ARTHROSCOPY DISTAL CLAVICLE EXCISION AND OPEN ROTATOR CUFF REPAIR  2011  . TOTAL SHOULDER ARTHROPLASTY  03/14/2012   Procedure: TOTAL SHOULDER ARTHROPLASTY;  Surgeon: Alta Corning, MD;  Location: [place];  Service: Orthopedics;  Laterality: Left;     Family History  Problem Relation Age of Onset  . Breast cancer Sister   . Rheum arthritis Sister   . Breast cancer Paternal Grandmother   . Kidney Stones Mother   . CVA Mother   . Hypertension Father   . Anesthesia problems Neg Hx   . Hypotension Neg Hx   . Malignant hyperthermia Neg Hx   . Pseudochol deficiency Neg Hx      Social History   Socioeconomic History  . Marital status: Divorced    Spouse name: Not on file  . Number of children: 2  . Years of education: Not on file  . Highest education level: Not on file  Occupational History  . Occupation: Retired  . Occupation: Glass blower/designer    Comment: Arts development officer  Tobacco Use  . Smoking status: Never Smoker  . Smokeless tobacco: Never Used  Vaping Use  . Vaping Use: Never used  Substance and Sexual Activity  . Alcohol use: No    Alcohol/week: 0.0 standard drinks  . Drug use: No  . Sexual activity: Not on file  Other Topics Concern  . Not on file  Social History Narrative  . Not on file   Social Determinants of Health   Financial Resource Strain:   . Difficulty of Paying Living Expenses:   Food Insecurity:   . Worried About Charity fundraiser in the Last Year:   . Arboriculturist in the Last Year:   Transportation Needs:   . Film/video editor (Medical):   Marland Kitchen Lack of Transportation (Non-Medical):   Physical Activity:   . Days of Exercise per Week:   . Minutes of Exercise per Session:   Stress:   . Feeling of Stress :   Social Connections:   . Frequency of Communication with Friends and Family:   . Frequency of Social Gatherings with  Friends and Family:   . Attends Religious Services:   . Active Member of Clubs or Organizations:   . Attends Archivist Meetings:   Marland Kitchen Marital Status:   Intimate Partner Violence:   . Fear of Current or Ex-Partner:   . Emotionally Abused:   Marland Kitchen Physically Abused:   . Sexually Abused:      Allergies  Allergen Reactions  . Dilaudid [Hydromorphone Hcl] Itching  . Morphine And Related Nausea And Vomiting     Prior to Admission medications   Medication Sig Start Date End Date Taking? Authorizing Provider  acetaminophen (TYLENOL) 325 MG tablet Take 650 mg by mouth every 6 (six) hours as needed for mild pain or moderate pain.    Yes [provider]  albuterol (PROAIR HFA) 108 (90 Base) MCG/ACT inhaler Inhale 2 puffs into the lungs every 6 (six) hours as needed for wheezing or shortness of breath. 07/01/19  Yes [name], MD  atorvastatin (LIPITOR) 20 MG tablet Take 1 tablet (20 mg total) by mouth daily at 6 PM. 02/12/20  Yes [name], MD  diphenhydrAMINE (SOMINEX) 25 MG tablet Take 50 mg by mouth at bedtime.    Yes [provider]  fenofibrate (TRICOR) 145 MG tablet TAKE 1 TABLET(145 MG) BY MOUTH DAILY 02/12/20  Yes [name], MD  fluticasone-salmeterol (ADVAIR [facility]) 115-21 MCG/ACT inhaler Inhale 2 puffs into the lungs 2 (two) times daily. 02/12/20  Yes [name], MD  gabapentin (NEURONTIN) 300 MG capsule 1 po BID prn. Patient taking differently: Take 300 mg by mouth 3 (three) times daily.  07/01/19  Yes [name], MD  ibuprofen (ADVIL,MOTRIN) 100 MG tablet Take 100 mg by mouth every 6 (six) hours as needed for pain.    Yes [provider]  Multiple Vitamin (MULTIVITAMIN) capsule Take 1 capsule by mouth daily.   Yes [provider]  olmesartan-hydrochlorothiazide (BENICAR HCT) 20-12.5 MG tablet Take 1 tablet by mouth daily. 02/12/20  Yes [name], MD  oxymetazoline (AFRIN) 0.05 % nasal spray Place 1 spray  into both nostrils at bedtime as needed for congestion.    Yes [provider]  Respiratory Therapy Supplies (FLUTTER) DEVI 1 each by Does not apply route 2 (two) times daily. 07/15/19  Yes Noemi Chapel P, DO  tiZANidine (ZANAFLEX) 2 MG tablet Take 2 mg by mouth 3 (three) times daily.  12/25/19  Yes [provider]  Dextromethorphan-Guaifenesin 20-400 MG TABS Take 1 tablet by mouth 2 (two) times daily as needed. Mucus Relief Patient not taking: Reported on 04/25/2020    [provider]  HYDROcodone-acetaminophen (NORCO/VICODIN) 5-325 MG tablet Take 1-2 tablets by mouth every 6 (six) hours as needed. 03/12/20   Malvin Johns, MD  meclizine (ANTIVERT) 25 MG tablet Take 1 tablet (25 mg total) by mouth 3 (three) times daily as needed for dizziness. Patient not taking: Reported on 04/25/2020 04/14/18   [name], MD     Depression screen [facility] 2/9 04/25/2020 02/12/2020 07/29/2019 07/01/2019 07/01/2019  Decreased Interest 0 0 0 0 0  Down, Depressed, Hopeless 0 0 0 0 0  PHQ - 2 Score 0 0 0 0 0     Fall Risk  04/25/2020 02/12/2020 07/29/2019 07/01/2019 07/01/2019  Falls in the past year? 0 0 0 0 0  Number falls in past yr: 0 - 0 0 0  Injury with Fall? 0 - 0 0 0  Follow up Falls evaluation completed;Education provided Falls evaluation completed Falls evaluation completed Falls evaluation completed Falls evaluation completed      PHYSICAL EXAM: BP 122/76 Comment: taken from a previous visit  Ht 5\' 4"  (1.626 m)   Wt 180 lb (81.6 kg)   BMI 30.90 kg/m    Wt Readings from Last 3 Encounters:  04/25/20 180 lb (81.6 kg)  03/12/20 180 lb (81.6 kg)  02/12/20 180 lb (81.6 kg)       Education/Counseling provided regarding diet and exercise, prevention of chronic diseases, smoking/tobacco cessation, if applicable, and reviewed "Covered Medicare Preventive Services."

## 2020-04-27 DIAGNOSIS — M19211 Secondary osteoarthritis, right shoulder: Secondary | ICD-10-CM | POA: Diagnosis not present

## 2020-05-03 ENCOUNTER — Other Ambulatory Visit: Payer: Self-pay | Admitting: Orthopedic Surgery

## 2020-05-13 ENCOUNTER — Other Ambulatory Visit: Payer: Self-pay | Admitting: Family Medicine

## 2020-05-13 DIAGNOSIS — E785 Hyperlipidemia, unspecified: Secondary | ICD-10-CM

## 2020-05-24 ENCOUNTER — Encounter (HOSPITAL_COMMUNITY): Admission: RE | Admit: 2020-05-24 | Payer: Medicare Other | Source: Ambulatory Visit

## 2020-06-13 NOTE — Patient Instructions (Addendum)
DUE TO COVID-19 ONLY ONE VISITOR IS ALLOWED TO COME WITH YOU AND STAY IN THE WAITING ROOM ONLY DURING PRE OP AND PROCEDURE DAY OF SURGERY. THE 1 VISITOR  MAY VISIT WITH YOU AFTER SURGERY IN YOUR PRIVATE ROOM DURING VISITING HOURS ONLY!  YOU NEED TO HAVE A COVID 19 TEST ON: 06/27/20 @ 12:00 pm , THIS TEST MUST BE DONE BEFORE SURGERY,  COVID TESTING SITE [place] [id number], IT IS ON THE RIGHT GOING OUT [address] APPROXIMATELY  2 MINUTES PAST [employer] ON THE RIGHT. ONCE YOUR COVID TEST IS COMPLETED,  PLEASE BEGIN THE QUARANTINE INSTRUCTIONS AS OUTLINED IN YOUR HANDOUT.                [name]    Your procedure is scheduled on: 06/30/20   Report to [facility] Main  Entrance   Report to admitting at: 8:30 AM     Call this number if you have problems the morning of surgery [phone]    Remember:   NO SOLID FOOD AFTER MIDNIGHT THE NIGHT PRIOR TO SURGERY. NOTHING BY MOUTH EXCEPT CLEAR LIQUIDS UNTIL: 8:00 am . PLEASE FINISH ENSURE DRINK PER SURGEON ORDER  WHICH NEEDS TO BE COMPLETED AT: 8:00 am .  CLEAR LIQUID DIET   Foods Allowed                                                                     Foods Excluded  Coffee and tea, regular and decaf                             liquids that you cannot  Plain Jell-O any favor except red or purple                                           see through such as: Fruit ices (not with fruit pulp)                                     milk, soups, orange juice  Iced Popsicles                                    All solid food Carbonated beverages, regular and diet                                    Cranberry, grape and apple juices Sports drinks like Gatorade Lightly seasoned clear broth or consume(fat free) Sugar, honey syrup  Sample Menu Breakfast                                Lunch  Supper Cranberry juice                    Beef broth                             Chicken broth Jell-O                                     Grape juice                           Apple juice Coffee or tea                        Jell-O                                      Popsicle                                                Coffee or tea                        Coffee or tea  _____________________________________________________________________   BRUSH YOUR TEETH MORNING OF SURGERY AND RINSE YOUR MOUTH OUT, NO CHEWING GUM CANDY OR MINTS.     Take these medicines the morning of surgery with A SIP OF WATER: fenofibrate,gabapentin.Use inhalers and nasal spray as usual.                               You may not have any metal on your body including hair pins and              piercings  Do not wear jewelry, make-up, lotions, powders or perfumes, deodorant             Do not wear nail polish on your fingernails.  Do not shave  48 hours prior to surgery.             Do not bring valuables to the hospital. [place].  Contacts, dentures or bridgework may not be worn into surgery.  Leave suitcase in the car. After surgery it may be brought to your room.     Patients discharged the day of surgery will not be allowed to drive home. IF YOU ARE HAVING SURGERY AND GOING HOME THE SAME DAY, YOU MUST HAVE AN ADULT TO DRIVE YOU HOME AND BE WITH YOU FOR 24 HOURS. YOU MAY GO HOME BY TAXI OR UBER OR ORTHERWISE, BUT AN ADULT MUST ACCOMPANY YOU HOME AND STAY WITH YOU FOR 24 HOURS.  Name and phone number of your driver:  Special Instructions: N/A              Please read over the following fact sheets you were given: _____________________________________________________________________        [facility] - Preparing for Surgery Before surgery, you can play an important role.  Because skin is not  sterile, your skin needs to be as free of germs as possible.  You can reduce the number of germs on your skin by washing with CHG  (chlorahexidine gluconate) soap before surgery.  CHG is an antiseptic cleaner which kills germs and bonds with the skin to continue killing germs even after washing. Please DO NOT use if you have an allergy to CHG or antibacterial soaps.  If your skin becomes reddened/irritated stop using the CHG and inform your nurse when you arrive at Short Stay. Do not shave (including legs and underarms) for at least 48 hours prior to the first CHG shower.  You may shave your face/neck. Please follow these instructions carefully:  1.  Shower with CHG Soap the night before surgery and the  morning of Surgery.  2.  If you choose to wash your hair, wash your hair first as usual with your  normal  shampoo.  3.  After you shampoo, rinse your hair and body thoroughly to remove the  shampoo.                           4.  Use CHG as you would any other liquid soap.  You can apply chg directly  to the skin and wash                       Gently with a scrungie or clean washcloth.  5.  Apply the CHG Soap to your body ONLY FROM THE NECK DOWN.   Do not use on face/ open                           Wound or open sores. Avoid contact with eyes, ears mouth and genitals (private parts).                       Wash face,  Genitals (private parts) with your normal soap.             6.  Wash thoroughly, paying special attention to the area where your surgery  will be performed.  7.  Thoroughly rinse your body with warm water from the neck down.  8.  DO NOT shower/wash with your normal soap after using and rinsing off  the CHG Soap.                9.  Pat yourself dry with a clean towel.            10.  Wear clean pajamas.            11.  Place clean sheets on your bed the night of your first shower and do not  sleep with pets. Day of Surgery : Do not apply any lotions/deodorants the morning of surgery.  Please wear clean clothes to the hospital/surgery center.  FAILURE TO FOLLOW THESE INSTRUCTIONS MAY RESULT IN THE CANCELLATION OF  YOUR SURGERY PATIENT SIGNATURE_________________________________  NURSE SIGNATURE__________________________________  ________________________________________________________________________  [facility]- Preparing for Total Shoulder Arthroplasty    Before surgery, you can play an important role. Because skin is not sterile, your skin needs to be as free of germs as possible. You can reduce the number of germs on your skin by using the following products. . Benzoyl Peroxide Gel o Reduces the number of germs present on the skin o Applied twice a day to shoulder area starting two days before  surgery    ==================================================================  Please follow these instructions carefully:  BENZOYL PEROXIDE 5% GEL  Please do not use if you have an allergy to benzoyl peroxide.   If your skin becomes reddened/irritated stop using the benzoyl peroxide.  Starting two days before surgery, apply as follows: 1. Apply benzoyl peroxide in the morning and at night. Apply after taking a shower. If you are not taking a shower clean entire shoulder front, back, and side along with the armpit with a clean wet washcloth.  2. Place a quarter-sized dollop on your shoulder and rub in thoroughly, making sure to cover the front, back, and side of your shoulder, along with the armpit.   2 days before ____ AM   ____ PM              1 day before ____ AM   ____ PM                         3. Do this twice a day for two days.  (Last application is the night before surgery, AFTER using the CHG soap as described below).  4. Do NOT apply benzoyl peroxide gel on the day of surgery.   Incentive Spirometer  An incentive spirometer is a tool that can help keep your lungs clear and active. This tool measures how well you are filling your lungs with each breath. Taking long deep breaths may help reverse or decrease the chance of developing breathing (pulmonary) problems (especially infection)  following:  A long period of time when you are unable to move or be active. BEFORE THE PROCEDURE   If the spirometer includes an indicator to show your best effort, your nurse or respiratory therapist will set it to a desired goal.  If possible, sit up straight or lean slightly forward. Try not to slouch.  Hold the incentive spirometer in an upright position. INSTRUCTIONS FOR USE  1. Sit on the edge of your bed if possible, or sit up as far as you can in bed or on a chair. 2. Hold the incentive spirometer in an upright position. 3. Breathe out normally. 4. Place the mouthpiece in your mouth and seal your lips tightly around it. 5. Breathe in slowly and as deeply as possible, raising the piston or the ball toward the top of the column. 6. Hold your breath for 3-5 seconds or for as long as possible. Allow the piston or ball to fall to the bottom of the column. 7. Remove the mouthpiece from your mouth and breathe out normally. 8. Rest for a few seconds and repeat Steps 1 through 7 at least 10 times every 1-2 hours when you are awake. Take your time and take a few normal breaths between deep breaths. 9. The spirometer may include an indicator to show your best effort. Use the indicator as a goal to work toward during each repetition. 10. After each set of 10 deep breaths, practice coughing to be sure your lungs are clear. If you have an incision (the cut made at the time of surgery), support your incision when coughing by placing a pillow or rolled up towels firmly against it. Once you are able to get out of bed, walk around indoors and cough well. You may stop using the incentive spirometer when instructed by your caregiver.  RISKS AND COMPLICATIONS  Take your time so you do not get dizzy or light-headed.  If you are in pain, you may  need to take or ask for pain medication before doing incentive spirometry. It is harder to take a deep breath if you are having pain. AFTER USE  Rest and  breathe slowly and easily.  It can be helpful to keep track of a log of your progress. Your caregiver can provide you with a simple table to help with this. If you are using the spirometer at home, follow these instructions: [place] IF:   You are having difficultly using the spirometer.  You have trouble using the spirometer as often as instructed.  Your pain medication is not giving enough relief while using the spirometer.  You develop fever of 100.5 F (38.1 C) or higher. SEEK IMMEDIATE MEDICAL CARE IF:   You cough up bloody sputum that had not been present before.  You develop fever of 102 F (38.9 C) or greater.  You develop worsening pain at or near the incision site. MAKE SURE YOU:   Understand these instructions.  Will watch your condition.  Will get help right away if you are not doing well or get worse. Document Released: 02/11/2007 Document Revised: 12/24/2011 Document Reviewed: 04/14/2007 [facility] Patient Information 2014 [place], [place].   ________________________________________________________________________

## 2020-06-22 ENCOUNTER — Other Ambulatory Visit: Payer: Self-pay

## 2020-06-22 ENCOUNTER — Encounter (HOSPITAL_COMMUNITY): Payer: Self-pay

## 2020-06-22 ENCOUNTER — Encounter (HOSPITAL_COMMUNITY)
Admission: RE | Admit: 2020-06-22 | Discharge: 2020-06-22 | Disposition: A | Discharge status: Home / Self Care | Payer: Medicare Other | Source: Ambulatory Visit | Attending: Orthopedic Surgery | Admitting: Orthopedic Surgery

## 2020-06-22 DIAGNOSIS — Z01812 Encounter for preprocedural laboratory examination: Secondary | ICD-10-CM | POA: Insufficient documentation

## 2020-06-22 HISTORY — DX: Pneumonia, unspecified organism: J18.9

## 2020-06-22 HISTORY — DX: Unspecified asthma, uncomplicated: J45.909

## 2020-06-22 HISTORY — DX: Malignant (primary) neoplasm, unspecified: C80.1

## 2020-06-22 LAB — COMPREHENSIVE METABOLIC PANEL
ALT: 13 U/L (ref 0–44)
AST: 17 U/L (ref 15–41)
Albumin: 4.9 g/dL (ref 3.5–5.0)
Alkaline Phosphatase: 59 U/L (ref 38–126)
Anion gap: 13 (ref 5–15)
BUN: 22 mg/dL (ref 8–23)
CO2: 24 mmol/L (ref 22–32)
Calcium: 10.2 mg/dL (ref 8.9–10.3)
Chloride: 102 mmol/L (ref 98–111)
Creatinine, Ser: 0.9 mg/dL (ref 0.44–1.00)
GFR calc Af Amer: >60 mL/min (ref >60)
GFR calc non Af Amer: >60 mL/min (ref >60)
Glucose, Bld: 115 mg/dL — ABNORMAL HIGH (ref 70–99)
Potassium: 4.3 mmol/L (ref 3.5–5.1)
Sodium: 139 mmol/L (ref 135–145)
Total Bilirubin: 0.8 mg/dL (ref 0.3–1.2)
Total Protein: 7.6 g/dL (ref 6.5–8.1)

## 2020-06-22 LAB — CBC WITH DIFFERENTIAL/PLATELET
Abs Immature Granulocytes: 0.02 10*3/uL (ref 0.00–0.07)
Basophils Absolute: 0 10*3/uL (ref 0.0–0.1)
Basophils Relative: 1 %
Eosinophils Absolute: 0.2 10*3/uL (ref 0.0–0.5)
Eosinophils Relative: 3 %
HCT: 38.3 % (ref 36.0–46.0)
Hemoglobin: 12.8 g/dL (ref 12.0–15.0)
Immature Granulocytes: 0 %
Lymphocytes Relative: 35 %
Lymphs Abs: 2.3 10*3/uL (ref 0.7–4.0)
MCH: 33.2 pg (ref 26.0–34.0)
MCHC: 33.4 g/dL (ref 30.0–36.0)
MCV: 99.2 fL (ref 80.0–100.0)
Monocytes Absolute: 0.5 10*3/uL (ref 0.1–1.0)
Monocytes Relative: 8 %
Neutro Abs: 3.5 10*3/uL (ref 1.7–7.7)
Neutrophils Relative %: 53 %
Platelets: 380 10*3/uL (ref 150–400)
RBC: 3.86 MIL/uL — ABNORMAL LOW (ref 3.87–5.11)
RDW: 11.9 % (ref 11.5–15.5)
WBC: 6.5 10*3/uL (ref 4.0–10.5)
nRBC: 0 % (ref 0.0–0.2)

## 2020-06-22 LAB — URINALYSIS, ROUTINE W REFLEX MICROSCOPIC
Bilirubin Urine: NEGATIVE
Glucose, UA: NEGATIVE mg/dL
Hgb urine dipstick: NEGATIVE
Ketones, ur: NEGATIVE mg/dL
Nitrite: NEGATIVE
Protein, ur: NEGATIVE mg/dL
Specific Gravity, Urine: 1.015 (ref 1.005–1.030)
pH: 5 (ref 5.0–8.0)

## 2020-06-22 LAB — APTT: aPTT: 32 seconds (ref 24–36)

## 2020-06-22 LAB — PROTIME-INR
INR: 1 (ref 0.8–1.2)
Prothrombin Time: 12.6 seconds (ref 11.4–15.2)

## 2020-06-22 LAB — SURGICAL PCR SCREEN
MRSA, PCR: NEGATIVE
Staphylococcus aureus: NEGATIVE

## 2020-06-22 NOTE — Progress Notes (Signed)
COVID Vaccine Completed:Yes Date COVID Vaccine completed:12/01/19 COVID vaccine manufacturer: [place]     PCP - Dr. Merri Ray. LOV: 03/09/20 Cardiologist -   Chest x-ray - 02/12/20. EPIC EKG - 02/12/20 EPIC Stress Test -  ECHO -  Cardiac Cath -   Sleep Study -  CPAP -   Fasting Blood Sugar -  Checks Blood Sugar _____ times a day  Blood Thinner Instructions: Aspirin Instructions: Last Dose:  Anesthesia review:   Patient denies shortness of breath, fever, cough and chest pain at PAT appointment   Patient verbalized understanding of instructions that were given to them at the PAT appointment. Patient was also instructed that they will need to review over the PAT instructions again at home before surgery.

## 2020-06-23 DIAGNOSIS — H2513 Age-related nuclear cataract, bilateral: Secondary | ICD-10-CM | POA: Diagnosis not present

## 2020-06-23 NOTE — Progress Notes (Signed)
Lab. Results: Leukocytes UA: Large.

## 2020-06-27 ENCOUNTER — Other Ambulatory Visit (HOSPITAL_COMMUNITY)
Admission: RE | Admit: 2020-06-27 | Discharge: 2020-06-27 | Disposition: A | Discharge status: Home / Self Care | Payer: Medicare Other | Source: Ambulatory Visit | Attending: Orthopedic Surgery | Admitting: Orthopedic Surgery

## 2020-06-27 DIAGNOSIS — Z20822 Contact with and (suspected) exposure to covid-19: Secondary | ICD-10-CM | POA: Insufficient documentation

## 2020-06-27 DIAGNOSIS — Z01812 Encounter for preprocedural laboratory examination: Secondary | ICD-10-CM | POA: Diagnosis not present

## 2020-06-28 LAB — SARS CORONAVIRUS 2 (TAT 6-24 HRS): SARS Coronavirus 2: NEGATIVE

## 2020-06-29 ENCOUNTER — Emergency Department (HOSPITAL_BASED_OUTPATIENT_CLINIC_OR_DEPARTMENT_OTHER)
Admission: EM | Admit: 2020-06-29 | Discharge: 2020-06-30 | Disposition: A | Discharge status: Home / Self Care | Payer: Medicare Other | Attending: Emergency Medicine | Admitting: Emergency Medicine

## 2020-06-29 ENCOUNTER — Other Ambulatory Visit: Payer: Self-pay

## 2020-06-29 ENCOUNTER — Encounter (HOSPITAL_BASED_OUTPATIENT_CLINIC_OR_DEPARTMENT_OTHER): Payer: Self-pay

## 2020-06-29 ENCOUNTER — Other Ambulatory Visit: Payer: Self-pay | Admitting: Gastroenterology

## 2020-06-29 DIAGNOSIS — Z96612 Presence of left artificial shoulder joint: Secondary | ICD-10-CM | POA: Insufficient documentation

## 2020-06-29 DIAGNOSIS — Z96653 Presence of artificial knee joint, bilateral: Secondary | ICD-10-CM | POA: Insufficient documentation

## 2020-06-29 DIAGNOSIS — D490 Neoplasm of unspecified behavior of digestive system: Secondary | ICD-10-CM

## 2020-06-29 DIAGNOSIS — Z7951 Long term (current) use of inhaled steroids: Secondary | ICD-10-CM | POA: Diagnosis not present

## 2020-06-29 DIAGNOSIS — C259 Malignant neoplasm of pancreas, unspecified: Secondary | ICD-10-CM | POA: Diagnosis not present

## 2020-06-29 DIAGNOSIS — Z79899 Other long term (current) drug therapy: Secondary | ICD-10-CM | POA: Insufficient documentation

## 2020-06-29 DIAGNOSIS — R112 Nausea with vomiting, unspecified: Secondary | ICD-10-CM | POA: Insufficient documentation

## 2020-06-29 DIAGNOSIS — I10 Essential (primary) hypertension: Secondary | ICD-10-CM | POA: Insufficient documentation

## 2020-06-29 DIAGNOSIS — J45909 Unspecified asthma, uncomplicated: Secondary | ICD-10-CM | POA: Diagnosis not present

## 2020-06-29 LAB — CBC
HCT: 38.2 % (ref 36.0–46.0)
Hemoglobin: 12.7 g/dL (ref 12.0–15.0)
MCH: 32.2 pg (ref 26.0–34.0)
MCHC: 33.2 g/dL (ref 30.0–36.0)
MCV: 96.7 fL (ref 80.0–100.0)
Platelets: 343 10*3/uL (ref 150–400)
RBC: 3.95 MIL/uL (ref 3.87–5.11)
RDW: 11.8 % (ref 11.5–15.5)
WBC: 9.2 10*3/uL (ref 4.0–10.5)
nRBC: 0 % (ref 0.0–0.2)

## 2020-06-29 LAB — COMPREHENSIVE METABOLIC PANEL
ALT: 12 U/L (ref 0–44)
AST: 18 U/L (ref 15–41)
Albumin: 4.6 g/dL (ref 3.5–5.0)
Alkaline Phosphatase: 55 U/L (ref 38–126)
Anion gap: 13 (ref 5–15)
BUN: 22 mg/dL (ref 8–23)
CO2: 22 mmol/L (ref 22–32)
Calcium: 9.6 mg/dL (ref 8.9–10.3)
Chloride: 101 mmol/L (ref 98–111)
Creatinine, Ser: 0.74 mg/dL (ref 0.44–1.00)
GFR calc Af Amer: >60 mL/min (ref >60)
GFR calc non Af Amer: >60 mL/min (ref >60)
Glucose, Bld: 133 mg/dL — ABNORMAL HIGH (ref 70–99)
Potassium: 3.9 mmol/L (ref 3.5–5.1)
Sodium: 136 mmol/L (ref 135–145)
Total Bilirubin: 0.9 mg/dL (ref 0.3–1.2)
Total Protein: 8 g/dL (ref 6.5–8.1)

## 2020-06-29 LAB — LIPASE, BLOOD: Lipase: 32 U/L (ref 11–51)

## 2020-06-29 MED ORDER — PROMETHAZINE HCL 25 MG/ML IJ SOLN
12.5000 mg | Freq: Once | INTRAMUSCULAR | Status: AC
  Administered 2020-06-29: 12.5 mg via INTRAVENOUS
  Filled 2020-06-29: qty 1

## 2020-06-29 MED ORDER — ONDANSETRON 4 MG PO TBDP
4.0000 mg | ORAL_TABLET | Freq: Once | ORAL | Status: AC
  Administered 2020-06-29: 4 mg via ORAL
  Filled 2020-06-29: qty 1

## 2020-06-29 MED ORDER — SODIUM CHLORIDE 0.9 % IV BOLUS
1000.0000 mL | Freq: Once | INTRAVENOUS | Status: AC
  Administered 2020-06-29: 1000 mL via INTRAVENOUS

## 2020-06-29 MED ORDER — PROMETHAZINE HCL 12.5 MG PO TABS: 12.5000 mg | ORAL_TABLET | Freq: Four times a day (QID) | ORAL | 0 refills | Status: DC | PRN

## 2020-06-29 NOTE — Discharge Instructions (Addendum)
It was our pleasure to provide your ER care today - we hope that you feel better.  Rest. Drink plenty of fluids.   Take phenergan as need for nausea - no driving for the next 6 hours, or if taking phenergan.   Follow up with primary care doctor in the next 1-2 days if symptoms fail to improve/resolve.  Also follow up with your doctor for your blood pressure as it is high today.   Return to ER if worse, new symptoms, fevers, new or severe pain, persistent vomiting, or other concern.

## 2020-06-29 NOTE — ED Triage Notes (Addendum)
Pt c/o n/v x 2 hours-denies pain-to triage in w/c-pt dry heaving during triage

## 2020-06-29 NOTE — ED Notes (Signed)
Pt drinking gingerale, no emesis. Pt urinated large amount in bathroom urine sample was poured out before sample collected.

## 2020-06-29 NOTE — ED Provider Notes (Signed)
[facility] EMERGENCY DEPARTMENT Provider Note   CSN: [id number] Arrival date & time: 06/29/20  1841     History Chief Complaint  Patient presents with  . Vomiting    [name] is a 74 y.o. female.  Patient c/o nausea/vomiting for the past several hours. Symptoms acute onset, moderate, recurrent, persistent. Emesis not bloody or bilious. Denies hx recurrent vomiting symptoms. No abd pain. Had normal bm today. Denies known ill contacts or bad food ingestion. No fever or chills. No abd distension. Denies prior abd surgery. 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 Denies back or flank pain. No current or recent chest pain or discomfort. No sob or unusual doe.   The history is provided by the patient.       Past Medical History:  Diagnosis Date  . Anemia   . Arthritis   . Asthma   . Cancer ([place])    Pancreas  . GERD (gastroesophageal reflux disease)    at times  . Hyperlipidemia   . Hypertension   . Peripheral vascular disease ([place])    has spider veins  . Pneumonia   . Pneumothorax, traumatic    RIGHT; associated with multiple rib fractures after a fall    Patient Active Problem List   Diagnosis Date Noted  . Prediabetes 02/14/2020  . Essential hypertension 08/19/2017  . Hyperlipidemia 08/19/2017  . Cough variant asthma 04/04/2016  . Upper airway cough syndrome 04/04/2016  . 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 ABDOMINAL HYSTERECTOMY  1982  . AUGMENTATION MAMMAPLASTY Bilateral    Patient has bilateral implants   . 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 BREAST SURGERY    . ESOPHAGOGASTRODUODENOSCOPY (EGD) WITH PROPOFOL N/A 01/05/2020   Procedure: ESOPHAGOGASTRODUODENOSCOPY (EGD) WITH PROPOFOL;  Surgeon: Arta Silence, MD;  Location: [facility];  Service: Endoscopy;  Laterality: N/A;  . EUS N/A 01/05/2020    Procedure: UPPER ENDOSCOPIC ULTRASOUND (EUS) LINEAR;  Surgeon: Arta Silence, MD;  Location: [facility];  Service: Endoscopy;  Laterality: N/A;  . FINE NEEDLE ASPIRATION N/A 01/05/2020   Procedure: FINE NEEDLE ASPIRATION (FNA) LINEAR;  Surgeon: Arta Silence, MD;  Location: [facility];  Service: Endoscopy;  Laterality: N/A;  . JOINT REPLACEMENT  2009   right knee  . JOINT REPLACEMENT  2011   left knee  . MASTECTOMY  2008   right breast  . REDUCTION MAMMAPLASTY Left    Patient had lift with implant on Left side   . SHOULDER ARTHROSCOPY DISTAL CLAVICLE EXCISION AND OPEN ROTATOR CUFF REPAIR  2011  . TOTAL SHOULDER ARTHROPLASTY  03/14/2012   Procedure: TOTAL SHOULDER ARTHROPLASTY;  Surgeon: Alta Corning, MD;  Location: [facility];  Service: Orthopedics;  Laterality: Left;     OB History   No obstetric history on file.     Family History  Problem Relation Age of Onset  . Breast cancer Sister   . Rheum arthritis Sister   . Breast cancer Paternal Grandmother   . Kidney Stones Mother   . CVA Mother   . Hypertension Father   . Anesthesia problems Neg Hx   . Hypotension Neg Hx   . Malignant hyperthermia Neg Hx   . Pseudochol deficiency Neg Hx     Social History   Tobacco Use  . Smoking status: Never Smoker  . Smokeless tobacco: Never  Used  Vaping Use  . Vaping Use: Never used  Substance Use Topics  . Alcohol use: No    Alcohol/week: 0.0 standard drinks  . Drug use: No    Home Medications Prior to Admission medications   Medication Sig Start Date End Date Taking? Authorizing Provider  acetaminophen (TYLENOL) 325 MG tablet Take 650 mg by mouth every 6 (six) hours as needed for mild pain or moderate pain.     [provider]  albuterol (PROAIR HFA) 108 (90 Base) MCG/ACT inhaler Inhale 2 puffs into the lungs every 6 (six) hours as needed for wheezing or shortness of breath. 07/01/19   [name], MD  atorvastatin (LIPITOR) 20 MG tablet Take 1 tablet (20 mg  total) by mouth daily at 6 PM. 02/12/20   [name], MD  Dextromethorphan-Guaifenesin 20-400 MG TABS Take 1 tablet by mouth at bedtime.     [provider]  diphenhydrAMINE (SOMINEX) 25 MG tablet Take 50 mg by mouth at bedtime.     [provider]  fenofibrate (TRICOR) 145 MG tablet TAKE 1 TABLET(145 MG) BY MOUTH DAILY Patient taking differently: Take 145 mg by mouth daily.  05/13/20   [name], MD  fluticasone-salmeterol (ADVAIR HFA) [phone] MCG/ACT inhaler Inhale 2 puffs into the lungs 2 (two) times daily. 02/12/20   [name], MD  gabapentin (NEURONTIN) 300 MG capsule 1 po BID prn. Patient taking differently: Take 300 mg by mouth 3 (three) times daily.  07/01/19   [name], MD  HYDROcodone-acetaminophen (NORCO/VICODIN) 5-325 MG tablet Take 1-2 tablets by mouth every 6 (six) hours as needed. Patient not taking: Reported on 06/14/2020 03/12/20   Malvin Johns, MD  ibuprofen (ADVIL,MOTRIN) 100 MG tablet Take 100 mg by mouth every 6 (six) hours as needed for pain.  Patient not taking: Reported on 06/14/2020    [provider]  meclizine (ANTIVERT) 25 MG tablet Take 1 tablet (25 mg total) by mouth 3 (three) times daily as needed for dizziness. Patient not taking: Reported on 04/25/2020 04/14/18   [name], MD  Multiple Vitamin (MULTIVITAMIN) capsule Take 1 capsule by mouth daily. Patient not taking: Reported on 06/14/2020    [provider]  olmesartan-hydrochlorothiazide (BENICAR HCT) 20-12.5 MG tablet Take 1 tablet by mouth daily. 02/12/20   [name], MD  oxymetazoline (AFRIN) 0.05 % nasal spray Place 1 spray into both nostrils at bedtime.     [provider]  Respiratory Therapy Supplies (FLUTTER) DEVI 1 each by Does not apply route 2 (two) times daily. 07/15/19   Julian Hy, DO  tiZANidine (ZANAFLEX) 2 MG tablet Take 2 mg by mouth 3 (three) times daily.  Patient not taking: Reported on 06/14/2020 12/25/19    [provider]    Allergies    Dilaudid [hydromorphone hcl] and Morphine and related  Review of Systems   Review of Systems  Constitutional: Negative for chills and fever.  HENT: Negative for sore throat.   Eyes: Negative for redness.  Respiratory: Negative for cough and shortness of breath.   Cardiovascular: Negative for chest pain.  Gastrointestinal: Positive for nausea and vomiting. Negative for abdominal pain.  Genitourinary: Negative for dysuria and flank pain.  Musculoskeletal: Negative for back pain and neck pain.  Skin: Negative for rash.  Neurological: Negative for headaches.  Hematological: Does not bruise/bleed easily.  Psychiatric/Behavioral: Negative for confusion.    Physical Exam Updated Vital Signs BP (!) 152/84 (BP Location: Left Arm)  Pulse 95   Resp 20   Ht 1.626 m (5\' 4" )   Wt 77.1 kg   SpO2 98%   BMI 29.18 kg/m   Physical Exam Vitals and nursing note reviewed.  Constitutional:      Appearance: Normal appearance. She is well-developed.     Comments: Dry heaving into bag.   HENT:     Head: Atraumatic.     Nose: Nose normal.     Mouth/Throat:     Mouth: Mucous membranes are moist.  Eyes:     General: No scleral icterus.    Conjunctiva/sclera: Conjunctivae normal.  Neck:     Trachea: No tracheal deviation.  Cardiovascular:     Rate and Rhythm: Normal rate and regular rhythm.     Pulses: Normal pulses.     Heart sounds: Normal heart sounds. No murmur heard.  No friction rub. No gallop.   Pulmonary:     Effort: Pulmonary effort is normal. No respiratory distress.     Breath sounds: Normal breath sounds.  Abdominal:     General: Bowel sounds are normal. There is no distension.     Palpations: Abdomen is soft. There is no mass.     Tenderness: There is no abdominal tenderness. There is no guarding or rebound.     Hernia: No hernia is present.  Genitourinary:    Comments: No cva tenderness.  Musculoskeletal:        General: No  swelling.     Cervical back: Normal range of motion and neck supple. No rigidity. No muscular tenderness.  Skin:    General: Skin is warm and dry.     Findings: No rash.  Neurological:     Mental Status: She is alert.     Comments: Alert, speech normal.   Psychiatric:        Mood and Affect: Mood normal.     ED Results / Procedures / Treatments   Labs (all labs ordered are listed, but only abnormal results are displayed) Results for orders placed or performed during the hospital encounter of 06/29/20  CBC  Result Value Ref Range   WBC 9.2 4.0 - 10.5 K/uL   RBC 3.95 3.87 - 5.11 MIL/uL   Hemoglobin 12.7 12.0 - 15.0 g/dL   HCT 38.2 36 - 46 %   MCV 96.7 80.0 - 100.0 fL   MCH 32.2 26.0 - 34.0 pg   MCHC 33.2 30.0 - 36.0 g/dL   RDW 11.8 11.5 - 15.5 %   Platelets 343 150 - 400 K/uL   nRBC 0.0 0.0 - 0.2 %  Comprehensive metabolic panel  Result Value Ref Range   Sodium 136 135 - 145 mmol/L   Potassium 3.9 3.5 - 5.1 mmol/L   Chloride 101 98 - 111 mmol/L   CO2 22 22 - 32 mmol/L   Glucose, Bld 133 (H) 70 - 99 mg/dL   BUN 22 8 - 23 mg/dL   Creatinine, Ser 0.74 0.44 - 1.00 mg/dL   Calcium 9.6 8.9 - 10.3 mg/dL   Total Protein 8.0 6.5 - 8.1 g/dL   Albumin 4.6 3.5 - 5.0 g/dL   AST 18 15 - 41 U/L   ALT 12 0 - 44 U/L   Alkaline Phosphatase 55 38 - 126 U/L   Total Bilirubin 0.9 0.3 - 1.2 mg/dL   GFR calc non Af Amer >60 >60 mL/min   GFR calc Af Amer >60 >60 mL/min   Anion gap 13 5 - 15  Lipase,  blood  Result Value Ref Range   Lipase 32 11 - 51 U/L    EKG EKG Interpretation  Date/Time:  Wednesday June 29 2020 22:25:20 EDT Ventricular Rate:  88 PR Interval:    QRS Duration: 94 QT Interval:  370 QTC Calculation: 448 R Axis:   67 Text Interpretation: Sinus rhythm No significant change since last tracing Confirmed by Lajean Saver [phone]) on 06/29/2020 10:37:36 PM   Radiology No results found.  Procedures Procedures (including critical care time)  Medications Ordered  in ED Medications  sodium chloride 0.9 % bolus 1,000 mL (has no administration in time range)  promethazine (PHENERGAN) injection 12.5 mg (has no administration in time range)  ondansetron (ZOFRAN-ODT) disintegrating tablet 4 mg (4 mg Oral Given 06/29/20 1907)    ED Course  I have reviewed the triage vital signs and the nursing notes.  Pertinent labs & imaging results that were available during my care of the patient were reviewed by me and considered in my medical decision making (see chart for details).    MDM Rules/Calculators/A&P                          Iv ns. Patient requests phenergan for nausea. Phenergan iv.   Labs sent.   Reviewed nursing notes and prior charts for additional history.   Labs reviewed/interpreted by me - chem normal. Wbc normal.  Recheck abd soft non tender, no abd pain.   UA pending - pt and family request d/c, she indicates no gu symptoms, no dysuria, no fever/chills, and that nausea/vomiting have resolved. She indicates has been to bathroom but flushed urine, and is not willing to stay, and have urine tested. Will d/c per pt request.       Final Clinical Impression(s) / ED Diagnoses Final diagnoses:  None    Rx / DC Orders ED Discharge Orders    None       Lajean Saver, MD 06/29/20 2347

## 2020-06-29 NOTE — ED Notes (Signed)
Active vomiting resolved. PT appears more comfortable. Family at bedside.

## 2020-06-30 ENCOUNTER — Encounter (HOSPITAL_COMMUNITY): Payer: Self-pay | Admitting: Anesthesiology

## 2020-06-30 ENCOUNTER — Ambulatory Visit (HOSPITAL_COMMUNITY): Admission: RE | Admit: 2020-06-30 | Payer: Medicare Other | Source: Home / Self Care | Admitting: Orthopedic Surgery

## 2020-06-30 ENCOUNTER — Encounter (HOSPITAL_COMMUNITY): Admission: RE | Payer: Self-pay | Source: Home / Self Care

## 2020-06-30 LAB — TYPE AND SCREEN
ABO/RH(D): O POS
Antibody Screen: NEGATIVE

## 2020-06-30 SURGERY — ARTHROPLASTY, SHOULDER, TOTAL, REVERSE
Anesthesia: Choice | Site: Shoulder | Laterality: Right

## 2020-06-30 NOTE — Anesthesia Preprocedure Evaluation (Deleted)
Anesthesia Evaluation  Patient identified by MRN, date of birth, ID band  Reviewed: Allergy & Precautions, NPO status , Patient's Chart, lab work & pertinent test results  Airway        Dental   Pulmonary asthma ,           Cardiovascular Exercise Tolerance: Good hypertension, + Peripheral Vascular Disease       Neuro/Psych negative neurological ROS  negative psych ROS   GI/Hepatic Neg liver ROS, GERD  ,H/o pancreatic cancer   Endo/Other  negative endocrine ROS  Renal/GU negative Renal ROS  negative genitourinary   Musculoskeletal  (+) Arthritis  (h/o left shoulder arthroplasty), Osteoarthritis,    Abdominal   Peds negative pediatric ROS (+)  Hematology   Anesthesia Other Findings   Reproductive/Obstetrics negative OB ROS                             Anesthesia Physical Anesthesia Plan  ASA: II  Anesthesia Plan: General and Regional   Post-op Pain Management: GA combined w/ Regional for post-op pain   Induction: Intravenous  PONV Risk Score and Plan: 3 and Ondansetron, Dexamethasone, Midazolam and Treatment may vary due to age or medical condition  Airway Management Planned: Oral ETT  Additional Equipment:   Intra-op Plan:   Post-operative Plan: Extubation in OR  Informed Consent:   Plan Discussed with: CRNA and Anesthesiologist  Anesthesia Plan Comments: (Exparel interscalene block for postop pain. GETA/. )        Anesthesia Quick Evaluation

## 2020-07-12 DIAGNOSIS — Z23 Encounter for immunization: Secondary | ICD-10-CM | POA: Diagnosis not present

## 2020-07-21 DIAGNOSIS — Z23 Encounter for immunization: Secondary | ICD-10-CM | POA: Diagnosis not present

## 2020-08-09 ENCOUNTER — Other Ambulatory Visit: Payer: Self-pay

## 2020-08-09 ENCOUNTER — Telehealth: Payer: Medicare Other | Admitting: Family Medicine

## 2020-08-11 ENCOUNTER — Other Ambulatory Visit: Payer: Self-pay | Admitting: Orthopedic Surgery

## 2020-08-11 DIAGNOSIS — M5442 Lumbago with sciatica, left side: Secondary | ICD-10-CM | POA: Diagnosis not present

## 2020-08-11 DIAGNOSIS — M545 Low back pain, unspecified: Secondary | ICD-10-CM | POA: Diagnosis not present

## 2020-08-12 ENCOUNTER — Ambulatory Visit (INDEPENDENT_AMBULATORY_CARE_PROVIDER_SITE_OTHER): Payer: Medicare Other | Admitting: Family Medicine

## 2020-08-12 ENCOUNTER — Encounter: Payer: Self-pay | Admitting: Family Medicine

## 2020-08-12 ENCOUNTER — Other Ambulatory Visit: Payer: Self-pay

## 2020-08-12 VITALS — BP 128/76 | HR 82 | Temp 98.0°F | Ht 64.0 in | Wt 172.0 lb

## 2020-08-12 DIAGNOSIS — R7303 Prediabetes: Secondary | ICD-10-CM

## 2020-08-12 DIAGNOSIS — R739 Hyperglycemia, unspecified: Secondary | ICD-10-CM

## 2020-08-12 DIAGNOSIS — J4520 Mild intermittent asthma, uncomplicated: Secondary | ICD-10-CM

## 2020-08-12 DIAGNOSIS — I10 Essential (primary) hypertension: Secondary | ICD-10-CM

## 2020-08-12 DIAGNOSIS — E785 Hyperlipidemia, unspecified: Secondary | ICD-10-CM | POA: Diagnosis not present

## 2020-08-12 MED ORDER — ALBUTEROL SULFATE HFA 108 (90 BASE) MCG/ACT IN AERS: 2.0000 | INHALATION_SPRAY | Freq: Four times a day (QID) | RESPIRATORY_TRACT | 0 refills | Status: DC | PRN

## 2020-08-12 MED ORDER — ATORVASTATIN CALCIUM 20 MG PO TABS: 20.0000 mg | ORAL_TABLET | Freq: Every day | ORAL | 5 refills | Status: DC

## 2020-08-12 MED ORDER — ADVAIR HFA 115-21 MCG/ACT IN AERO: 2.0000 | INHALATION_SPRAY | Freq: Two times a day (BID) | RESPIRATORY_TRACT | 5 refills | Status: DC

## 2020-08-12 MED ORDER — FENOFIBRATE 145 MG PO TABS: 145.0000 mg | ORAL_TABLET | Freq: Every day | ORAL | 1 refills | Status: DC

## 2020-08-12 MED ORDER — OLMESARTAN MEDOXOMIL-HCTZ 20-12.5 MG PO TABS: 1.0000 | ORAL_TABLET | Freq: Every day | ORAL | 5 refills | Status: DC

## 2020-08-12 NOTE — Patient Instructions (Addendum)
DUE TO COVID-19 ONLY ONE VISITOR IS ALLOWED TO COME WITH YOU AND STAY IN THE WAITING ROOM ONLY DURING PRE OP AND PROCEDURE DAY OF SURGERY. THE 1 VISITOR  MAY VISIT WITH YOU AFTER SURGERY IN YOUR PRIVATE ROOM DURING VISITING HOURS ONLY!  YOU NEED TO HAVE A COVID 19 TEST ON__11-8-21_____ @_______ , THIS TEST MUST BE DONE BEFORE SURGERY,  COVID TESTING SITE [address] [place] [address], IT IS ON THE RIGHT GOING OUT [address] APPROXIMATELY  2 MINUTES PAST [employer] ON THE RIGHT. ONCE YOUR COVID TEST IS COMPLETED,  PLEASE BEGIN THE QUARANTINE INSTRUCTIONS AS OUTLINED IN YOUR HANDOUT.                [name]  08/12/2020   Your procedure is scheduled on: 08-25-20   Report to [facility] Main  Entrance   Report to admitting at    07:25  AM     Call this number if you have problems the morning of surgery [phone]    Remember:  NO SOLID FOOD AFTER MIDNIGHT THE NIGHT PRIOR TO SURGERY.   NOTHING BY MOUTH EXCEPT CLEAR LIQUIDS UNTIL     06:50  am    . PLEASE FINISH ENSURE DRINK PER SURGEON ORDER  WHICH NEEDS TO BE COMPLETED AT   06:50 am then nothing by mouth .  BRUSH YOUR TEETH MORNING OF SURGERY AND RINSE YOUR MOUTH OUT, NO CHEWING GUM CANDY OR MINTS.     Take these medicines the morning of surgery with A SIP OF WATER: Gabapentin use your inhalers and bring them with you to the hospital                                 You may not have any metal on your body including hair pins and              piercings  Do not wear jewelry, make-up, lotions, powders or perfumes, deodorant             Do not wear nail polish on your fingernails.  Do not shave  48 hours prior to surgery.         Do not bring valuables to the hospital. [place].  Contacts, dentures or bridgework may not be worn into surgery.       Patients discharged the day of surgery will not be allowed to drive home.  IF YOU ARE HAVING  SURGERY AND GOING HOME THE SAME DAY, YOU MUST HAVE AN ADULT TO DRIVE YOU HOME AND BE WITH YOU FOR 24 HOURS.  YOU MAY GO HOME BY TAXI OR UBER OR ORTHERWISE, BUT AN ADULT MUST ACCOMPANY YOU HOME AND STAY WITH YOU FOR 24 HOURS.  Name and phone number of your driver:  Special Instructions: N/A              Please read over the following fact sheets you were given: _____________________________________________________________________  [facility]- Preparing for Total Shoulder Arthroplasty    Before surgery, you can play an important role. Because skin is not sterile, your skin needs to be as free of germs as possible. You can reduce the number of germs on your skin by using the following products. . Benzoyl Peroxide Gel o Reduces the number of germs present on the  skin o Applied twice a day to shoulder area starting two days before surgery    ==================================================================  Please follow these instructions carefully:  BENZOYL PEROXIDE 5% GEL  Please do not use if you have an allergy to benzoyl peroxide.   If your skin becomes reddened/irritated stop using the benzoyl peroxide.  Starting two days before surgery, apply as follows: 1. Apply benzoyl peroxide in the morning and at night. Apply after taking a shower. If you are not taking a shower clean entire shoulder front, back, and side along with the armpit with a clean wet washcloth.  2. Place a quarter-sized dollop on your shoulder and rub in thoroughly, making sure to cover the front, back, and side of your shoulder, along with the armpit.   2 days before ____ AM   ____ PM              1 day before ____ AM   ____ PM                         3. Do this twice a day for two days.  (Last application is the night before surgery, AFTER using the CHG soap as described below).  4. Do NOT apply benzoyl peroxide gel on the day of surgery.   [facility] - Preparing for Surgery Before surgery, you can play an  important role.  Because skin is not sterile, your skin needs to be as free of germs as possible.  You can reduce the number of germs on your skin by washing with CHG (chlorahexidine gluconate) soap before surgery.  CHG is an antiseptic cleaner which kills germs and bonds with the skin to continue killing germs even after washing. Please DO NOT use if you have an allergy to CHG or antibacterial soaps.  If your skin becomes reddened/irritated stop using the CHG and inform your nurse when you arrive at Short Stay. Do not shave (including legs and underarms) for at least 48 hours prior to the first CHG shower.  You may shave your face/neck. Please follow these instructions carefully:  1.  Shower with CHG Soap the night before surgery and the  morning of Surgery.  2.  If you choose to wash your hair, wash your hair first as usual with your  normal  shampoo.  3.  After you shampoo, rinse your hair and body thoroughly to remove the  shampoo.                                        4.  Use CHG as you would any other liquid soap.  You can apply chg directly  to the skin and wash                       Gently with a scrungie or clean washcloth.  5.  Apply the CHG Soap to your body ONLY FROM THE NECK DOWN.   Do not use on face/ open                           Wound or open sores. Avoid contact with eyes, ears mouth and genitals (private parts).                       Wash face,  Development worker, international aid (private  parts) with your normal soap.             6.  Wash thoroughly, paying special attention to the area where your surgery  will be performed.  7.  Thoroughly rinse your body with warm water from the neck down.  8.  DO NOT shower/wash with your normal soap after using and rinsing off  the CHG Soap.                9.  Pat yourself dry with a clean towel.            10.  Wear clean pajamas.            11.  Place clean sheets on your bed the night of your first shower and do not  sleep with pets. Day of Surgery : Do not apply  any lotions/deodorants the morning of surgery.  Please wear clean clothes to the hospital/surgery center.  FAILURE TO FOLLOW THESE INSTRUCTIONS MAY RESULT IN THE CANCELLATION OF YOUR SURGERY PATIENT SIGNATURE_________________________________  NURSE SIGNATURE__________________________________  ________________________________________________________________________   Adam Phenix  An incentive spirometer is a tool that can help keep your lungs clear and active. This tool measures how well you are filling your lungs with each breath. Taking long deep breaths may help reverse or decrease the chance of developing breathing (pulmonary) problems (especially infection) following:  A long period of time when you are unable to move or be active. BEFORE THE PROCEDURE   If the spirometer includes an indicator to show your best effort, your nurse or respiratory therapist will set it to a desired goal.  If possible, sit up straight or lean slightly forward. Try not to slouch.  Hold the incentive spirometer in an upright position. INSTRUCTIONS FOR USE  1. Sit on the edge of your bed if possible, or sit up as far as you can in bed or on a chair. 2. Hold the incentive spirometer in an upright position. 3. Breathe out normally. 4. Place the mouthpiece in your mouth and seal your lips tightly around it. 5. Breathe in slowly and as deeply as possible, raising the piston or the ball toward the top of the column. 6. Hold your breath for 3-5 seconds or for as long as possible. Allow the piston or ball to fall to the bottom of the column. 7. Remove the mouthpiece from your mouth and breathe out normally. 8. Rest for a few seconds and repeat Steps 1 through 7 at least 10 times every 1-2 hours when you are awake. Take your time and take a few normal breaths between deep breaths. 9. The spirometer may include an indicator to show your best effort. Use the indicator as a goal to work toward during each  repetition. 10. After each set of 10 deep breaths, practice coughing to be sure your lungs are clear. If you have an incision (the cut made at the time of surgery), support your incision when coughing by placing a pillow or rolled up towels firmly against it. Once you are able to get out of bed, walk around indoors and cough well. You may stop using the incentive spirometer when instructed by your caregiver.  RISKS AND COMPLICATIONS  Take your time so you do not get dizzy or light-headed.  If you are in pain, you may need to take or ask for pain medication before doing incentive spirometry. It is harder to take a deep breath if you are having pain. AFTER USE  Rest  and breathe slowly and easily.  It can be helpful to keep track of a log of your progress. Your caregiver can provide you with a simple table to help with this. If you are using the spirometer at home, follow these instructions: [place] IF:   You are having difficultly using the spirometer.  You have trouble using the spirometer as often as instructed.  Your pain medication is not giving enough relief while using the spirometer.  You develop fever of 100.5 F (38.1 C) or higher. SEEK IMMEDIATE MEDICAL CARE IF:   You cough up bloody sputum that had not been present before.  You develop fever of 102 F (38.9 C) or greater.  You develop worsening pain at or near the incision site. MAKE SURE YOU:   Understand these instructions.  Will watch your condition.  Will get help right away if you are not doing well or get worse. Document Released: 02/11/2007 Document Revised: 12/24/2011 Document Reviewed: 04/14/2007 [facility] Patient Information 2014 [place], [place].   ________________________________________________________________________

## 2020-08-12 NOTE — Patient Instructions (Addendum)
For your fenofibrate it is okay to ask your pharmacist if there may be a less expensive option, but I will look at your cholesterol levels today so we can help make that decision.  Continue both fenofibrate and Lipitor for now.   No change in the blood pressure medicine today.  I will check the prediabetes test again that was slightly elevated back in April. Try to avoid sugar containing beverages like soda.   I do hear a slight wheeze on exam today.  Can try the albuterol few times per day if needed over the next couple of days, but if you require albuterol daily in the next week, or any worsening of symptoms please return to be seen here or urgent care/ER if needed.   Prediabetes Prediabetes is the condition of having a blood sugar (blood glucose) level that is higher than it should be, but not high enough for you to be diagnosed with type 2 diabetes. Having prediabetes puts you at risk for developing type 2 diabetes (type 2 diabetes mellitus). Prediabetes may be called impaired glucose tolerance or impaired fasting glucose. Prediabetes usually does not cause symptoms. Your health care provider can diagnose this condition with blood tests. You may be tested for prediabetes if you are overweight and if you have at least one other risk factor for prediabetes. What is blood glucose, and how is it measured? Blood glucose refers to the amount of glucose in your bloodstream. Glucose comes from eating foods that contain sugars and starches (carbohydrates), which the body breaks down into glucose. Your blood glucose level may be measured in mg/dL (milligrams per deciliter) or mmol/L (millimoles per liter). Your blood glucose may be checked with one or more of the following blood tests:  A fasting blood glucose (FBG) test. You will not be allowed to eat (you will fast) for 8 hours or longer before a blood sample is taken. ? A normal range for FBG is 70-100 mg/dl (3.9-5.6 mmol/L).  An A1c (hemoglobin  A1c) blood test. This test provides information about blood glucose control over the previous 2?4months.  An oral glucose tolerance test (OGTT). This test measures your blood glucose at two times: ? After fasting. This is your baseline level. ? Two hours after you drink a beverage that contains glucose. You may be diagnosed with prediabetes:  If your FBG is 100?125 mg/dL (5.6-6.9 mmol/L).  If your A1c level is 5.7?6.4%.  If your OGTT result is 140?199 mg/dL (7.8-11 mmol/L). These blood tests may be repeated to confirm your diagnosis. How can this condition affect me? The pancreas produces a hormone (insulin) that helps to move glucose from the bloodstream into cells. When cells in the body do not respond properly to insulin that the body makes (insulin resistance), excess glucose builds up in the blood instead of going into cells. As a result, high blood glucose (hyperglycemia) can develop, which can cause many complications. Hyperglycemia is a symptom of prediabetes. Having high blood glucose for a long time is dangerous. Too much glucose in your blood can damage your nerves and blood vessels. Long-term damage can lead to complications from diabetes, which may include:  Heart disease.  Stroke.  Blindness.  Kidney disease.  Depression.  Poor circulation in the feet and legs, which could lead to surgical removal (amputation) in severe cases. What can increase my risk? Risk factors for prediabetes include:  Having a family member with type 2 diabetes.  Being overweight or obese.  Being older than age 19.  Being of American [place], African-American, Hispanic/Latino, or Asian/Pacific Islander descent.  Having an inactive (sedentary) lifestyle.  Having a history of heart disease.  History of gestational diabetes or polycystic ovary syndrome (PCOS), in women.  Having low levels of good cholesterol (HDL-C) or high levels of blood fats (triglycerides).  Having high blood  pressure. What actions can I take to prevent diabetes?      Be physically active. ? Do moderate-intensity physical activity for 30 or more minutes on 5 or more days of the week, or as much as told by your health care provider. This could be brisk walking, biking, or water aerobics. ? Ask your health care provider what activities are safe for you. A mix of physical activities may be best, such as walking, swimming, cycling, and strength training.  Lose weight as told by your health care provider. ? Losing 5-7% of your body weight can reverse insulin resistance. ? Your health care provider can determine how much weight loss is best for you and can help you lose weight safely.  Follow a healthy meal plan. This includes eating lean proteins, complex carbohydrates, fresh fruits and vegetables, low-fat dairy products, and healthy fats. ? Follow instructions from your health care provider about eating or drinking restrictions. ? Make an appointment to see a diet and nutrition specialist (registered dietitian) to help you create a healthy eating plan that is right for you.  Do not smoke or use any tobacco products, such as cigarettes, chewing tobacco, and e-cigarettes. If you need help quitting, ask your health care provider.  Take over-the-counter and prescription medicines as told by your health care provider. You may be prescribed medicines that help lower the risk of type 2 diabetes.  Keep all follow-up visits as told by your health care provider. This is important. Summary  Prediabetes is the condition of having a blood sugar (blood glucose) level that is higher than it should be, but not high enough for you to be diagnosed with type 2 diabetes.  Having prediabetes puts you at risk for developing type 2 diabetes (type 2 diabetes mellitus).  To help prevent type 2 diabetes, make lifestyle changes such as being physically active and eating a healthy diet. Lose weight as told by your health  care provider. This information is not intended to replace advice given to you by your health care provider. Make sure you discuss any questions you have with your health care provider. Document Revised: 01/23/2019 Document Reviewed: 11/22/2015 Elsevier Patient Education  [employer].   If you have lab work done today you will be contacted with your lab results within the next 2 weeks.  If you have not heard from [place] then please contact us. The fastest way to get your results is to register for My Chart.   IF you received an x-ray today, you will receive an invoice from [facility]. Please contact [facility] at [phone] with questions or concerns regarding your invoice.   IF you received labwork today, you will receive an invoice from [facility]. Please contact [facility] at [phone] with questions or concerns regarding your invoice.   Our billing staff will not be able to assist you with questions regarding bills from these companies.  You will be contacted with the lab results as soon as they are available. The fastest way to get your results is to activate your My Chart account. Instructions are located on the last page of this paperwork. If you have not heard from [place] regarding  the results in 2 weeks, please contact this office.

## 2020-08-12 NOTE — Progress Notes (Signed)
Subjective:  Patient ID: [name], female    DOB: Nov 18, 1945  Age: 74 y.o. MRN: [id number]  CC:  Chief Complaint  Patient presents with  . Follow-up    on hypertension and hyperlipidemia. PT reports no issues with these conditions since last OV. Pt reports no physical symptoms of these conditions. t reports her medications are working well with no side effects.  . medication question    pt reports she want to askl the provider about a possible cheaper option for her Fenofibrate medication.    HPI ORVILLA TRUETT presents for   Hypertension: Treated with Benicar HCT 20/12.5 mg daily. No new side effects.  Surgery on R shoulder on 11/11 - Dr. Tamera Punt - planned on September - rescheduled d/t illness.  Home readings: none.  BP Readings from Last 3 Encounters:  08/13/20 (!) 165/85  08/13/20 (!) 168/89  08/12/20 128/76   Lab Results  Component Value Date   CREATININE 0.69 08/13/2020   Hyperlipidemia: Lipitor 20 mg nightly, fenofibrate 145 mg daily.  Asking about less expensive option for fenofibrate.  Of note her labs in April with normal triglycerides. Fenofibrate $45.   Lab Results  Component Value Date   CHOL 182 08/12/2020   HDL 65 08/12/2020   LDLCALC 93 08/12/2020   TRIG 141 08/12/2020   CHOLHDL 2.8 08/12/2020   Lab Results  Component Value Date   ALT 16 08/13/2020   AST 16 08/13/2020   ALKPHOS 71 08/13/2020   BILITOT 0.7 08/13/2020   Prediabetes  Glucose 133 on 9/15 - unknown if fasting.  Soda/sweet tea/fast food/takeout: soda - 2 liters every 3-4 days. [employer]eps[employer]. Rare sweet tea.  Rare fast food or take out.  Exercise:walling for exercise. Limited with orthopedic issues at times. Ortho/back Dr. Lynann Bologna, Dr. Mina Marble for injections.  Body mass index is 29.52 kg/m.:  Lab Results  Component Value Date   HGBA1C 5.4 08/12/2020   Wt Readings from Last 3 Encounters:  08/13/20 171 lb 15.3 oz (78 kg)  08/13/20 172 lb (78 kg)  08/12/20 172 lb (78 kg)    Followed by Dr. 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   Wheezing/asthma: Cough variant in past. On advair BID.  Noted on exam today. She has noticed in past week with allergies? No fever. Some cough, clear-white mucus. Short of breath walking long distance last week, not now or at rest. Has not used albuterol recently. Rarely uses.  History Patient Active Problem List   Diagnosis Date Noted  . Prediabetes 02/14/2020  . Essential hypertension 08/19/2017  . Hyperlipidemia 08/19/2017  . Cough variant asthma 04/04/2016  . Upper airway cough syndrome 04/04/2016  . 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 Anemia   . Arthritis   . Asthma   . Cancer ([place])    Pancreas  . GERD (gastroesophageal reflux disease)    at times  . Hyperlipidemia   . Hypertension   . Peripheral vascular disease ([place])    has spider veins  . Pneumonia   . Pneumothorax, traumatic    RIGHT; associated with multiple rib fractures after a fall   Past Surgical History:  Procedure Laterality Date  . ABDOMINAL HYSTERECTOMY  1982  . AUGMENTATION MAMMAPLASTY Bilateral    Patient has bilateral implants   . 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 BREAST SURGERY    . ESOPHAGOGASTRODUODENOSCOPY (EGD) WITH PROPOFOL  N/A 01/05/2020   Procedure: ESOPHAGOGASTRODUODENOSCOPY (EGD) WITH PROPOFOL;  Surgeon: Arta Silence, MD;  Location: [facility];  Service: Endoscopy;  Laterality: N/A;  . EUS N/A 01/05/2020   Procedure: UPPER ENDOSCOPIC ULTRASOUND (EUS) LINEAR;  Surgeon: Arta Silence, MD;  Location: [facility];  Service: Endoscopy;  Laterality: N/A;  . FINE NEEDLE ASPIRATION N/A 01/05/2020   Procedure: FINE NEEDLE ASPIRATION (FNA) LINEAR;  Surgeon: Arta Silence, MD;  Location: [facility];  Service: Endoscopy;  Laterality: N/A;  . JOINT REPLACEMENT  2009   right knee  . JOINT REPLACEMENT  2011   left knee  . MASTECTOMY   2008   right breast  . REDUCTION MAMMAPLASTY Left    Patient had lift with implant on Left side   . SHOULDER ARTHROSCOPY DISTAL CLAVICLE EXCISION AND OPEN ROTATOR CUFF REPAIR  2011  . TOTAL SHOULDER ARTHROPLASTY  03/14/2012   Procedure: TOTAL SHOULDER ARTHROPLASTY;  Surgeon: Alta Corning, MD;  Location: [facility];  Service: Orthopedics;  Laterality: Left;   Allergies  Allergen Reactions  . Dilaudid [Hydromorphone Hcl] Itching  . Morphine And Related Nausea And Vomiting   Prior to Admission medications   Medication Sig Start Date End Date Taking? Authorizing Provider  acetaminophen (TYLENOL) 325 MG tablet Take 650 mg by mouth every 6 (six) hours as needed for mild pain or moderate pain.    Yes [provider]  albuterol (PROAIR HFA) 108 (90 Base) MCG/ACT inhaler Inhale 2 puffs into the lungs every 6 (six) hours as needed for wheezing or shortness of breath. 07/01/19  Yes [name], MD  atorvastatin (LIPITOR) 20 MG tablet Take 1 tablet (20 mg total) by mouth daily at 6 PM. 02/12/20  Yes [name], MD  Dextromethorphan-Guaifenesin 20-400 MG TABS Take 1 tablet by mouth at bedtime.    Yes [provider]  diphenhydrAMINE (SOMINEX) 25 MG tablet Take 50 mg by mouth at bedtime.    Yes [provider]  fenofibrate (TRICOR) 145 MG tablet TAKE 1 TABLET(145 MG) BY MOUTH DAILY Patient taking differently: Take 145 mg by mouth daily.  05/13/20  Yes [name], MD  fluticasone-salmeterol (ADVAIR HFA) [phone] MCG/ACT inhaler Inhale 2 puffs into the lungs 2 (two) times daily. 02/12/20  Yes [name], MD  gabapentin (NEURONTIN) 300 MG capsule 1 po BID prn. Patient taking differently: Take 300 mg by mouth 3 (three) times daily.  07/01/19  Yes [name], MD  ibuprofen (ADVIL,MOTRIN) 100 MG tablet Take 100 mg by mouth every 6 (six) hours as needed for pain.    Yes [provider]  Multiple Vitamin (MULTIVITAMIN) capsule Take 1 capsule by mouth  daily.    Yes [provider]  olmesartan-hydrochlorothiazide (BENICAR HCT) 20-12.5 MG tablet Take 1 tablet by mouth daily. 02/12/20  Yes [name], MD  oxymetazoline (AFRIN) 0.05 % nasal spray Place 1 spray into both nostrils at bedtime.    Yes [provider]  Respiratory Therapy Supplies (FLUTTER) DEVI 1 each by Does not apply route 2 (two) times daily. 07/15/19  Yes Julian Hy, DO  HYDROcodone-acetaminophen (NORCO/VICODIN) 5-325 MG tablet Take 1-2 tablets by mouth every 6 (six) hours as needed. Patient not taking: Reported on 06/14/2020 03/12/20   Malvin Johns, MD  meclizine (ANTIVERT) 25 MG tablet Take 1 tablet (25 mg total) by mouth 3 (three) times daily as needed for dizziness. Patient not taking: Reported on 04/25/2020 04/14/18   [name], MD  promethazine (PHENERGAN) 12.5  MG tablet Take 1 tablet (12.5 mg total) by mouth every 6 (six) hours as needed for nausea or vomiting. Patient not taking: Reported on 08/12/2020 06/29/20   Lajean Saver, MD  tiZANidine (ZANAFLEX) 2 MG tablet Take 2 mg by mouth 3 (three) times daily.  Patient not taking: Reported on 06/14/2020 12/25/19   [provider]   Social History   Socioeconomic History  . Marital status: Divorced    Spouse name: Not on file  . Number of children: 2  . Years of education: Not on file  . Highest education level: Not on file  Occupational History  . Occupation: Retired  . Occupation: Glass blower/designer    Comment: Arts development officer  Tobacco Use  . Smoking status: Never Smoker  . Smokeless tobacco: Never Used  Vaping Use  . Vaping Use: Never used  Substance and Sexual Activity  . Alcohol use: No    Alcohol/week: 0.0 standard drinks  . Drug use: No  . Sexual activity: Not on file  Other Topics Concern  . Not on file  Social History Narrative  . Not on file   Social Determinants of Health   Financial Resource Strain:   . Difficulty of Paying Living Expenses: Not on  file  Food Insecurity:   . Worried About Charity fundraiser in the Last Year: Not on file  . Ran Out of Food in the Last Year: Not on file  Transportation Needs:   . Lack of Transportation (Medical): Not on file  . Lack of Transportation (Non-Medical): Not on file  Physical Activity:   . Days of Exercise per Week: Not on file  . Minutes of Exercise per Session: Not on file  Stress:   . Feeling of Stress : Not on file  Social Connections:   . Frequency of Communication with Friends and Family: Not on file  . Frequency of Social Gatherings with Friends and Family: Not on file  . Attends Religious Services: Not on file  . Active Member of Clubs or Organizations: Not on file  . Attends Archivist Meetings: Not on file  . Marital Status: Not on file  Intimate Partner Violence:   . Fear of Current or Ex-Partner: Not on file  . Emotionally Abused: Not on file  . Physically Abused: Not on file  . Sexually Abused: Not on file    Review of Systems  Constitutional: Negative for fatigue and unexpected weight change.  Respiratory: Positive for wheezing (min past week. ). Negative for chest tightness and shortness of breath.   Cardiovascular: Negative for chest pain, palpitations and leg swelling.  Gastrointestinal: Negative for abdominal pain and blood in stool.  Neurological: Negative for dizziness, syncope, light-headedness and headaches.     Objective:   Vitals:   08/12/20 1338  BP: 128/76  Pulse: 82  Temp: 98 F (36.7 C)  TempSrc: Temporal  SpO2: 98%  Weight: 172 lb (78 kg)  Height: 5\' 4"  (1.626 m)     Physical Exam Vitals reviewed.  Constitutional:      Appearance: She is well-developed.  HENT:     Head: Normocephalic and atraumatic.  Eyes:     Conjunctiva/sclera: Conjunctivae normal.     Pupils: Pupils are equal, round, and reactive to light.  Neck:     Vascular: No carotid bruit.  Cardiovascular:     Rate and Rhythm: Normal rate and regular rhythm.       Heart sounds: Normal heart sounds.  Pulmonary:  Effort: Pulmonary effort is normal.     Breath sounds: Wheezing (faint expiratory, no distress. ) present.  Abdominal:     Palpations: Abdomen is soft. There is no pulsatile mass.     Tenderness: There is no abdominal tenderness.  Skin:    General: Skin is warm and dry.  Neurological:     Mental Status: She is alert and oriented to person, place, and time.  Psychiatric:        Behavior: Behavior normal.     Assessment & Plan:  GENELLE ECONOMOU is a 74 y.o. female . Prediabetes - Plan: Hemoglobin A1c  -Check labs, continue watch diet, exercise as tolerated with some limitations as above due to some of her orthopedic issues.  Follow-up with orthopedic specialist.  Essential hypertension - Plan: Comprehensive metabolic panel, olmesartan-hydrochlorothiazide (BENICAR HCT) 20-12.5 MG tablet  -  Stable, tolerating current regimen. Medications refilled. Labs pending as above.   Hyperlipidemia, unspecified hyperlipidemia type - Plan: Comprehensive metabolic panel, Lipid panel, atorvastatin (LIPITOR) 20 MG tablet, fenofibrate (TRICOR) 145 MG tablet  -Check labs, continue combo of Lipitor and fenofibrate but could look into either less expensive options of fenofibrate, can discuss with pharmacist, or potentially may be able to be on statin alone.  Labs pending  Hyperglycemia - Plan: Comprehensive metabolic panel, Hemoglobin A1c  -As above, check A1c  Mild intermittent asthma without complication - Plan: albuterol (PROAIR HFA) 108 (90 Base) MCG/ACT inhaler, fluticasone-salmeterol (ADVAIR HFA) 115-21 MCG/ACT inhaler  - stable, tolerating current regimen.   Meds ordered this encounter  Medications  . albuterol (PROAIR HFA) 108 (90 Base) MCG/ACT inhaler    Sig: Inhale 2 puffs into the lungs every 6 (six) hours as needed for wheezing or shortness of breath.    Dispense:  18 g    Refill:  0  . fluticasone-salmeterol (ADVAIR HFA) 115-21  MCG/ACT inhaler    Sig: Inhale 2 puffs into the lungs 2 (two) times daily.    Dispense:  12 g    Refill:  5  . atorvastatin (LIPITOR) 20 MG tablet    Sig: Take 1 tablet (20 mg total) by mouth daily at 6 PM.    Dispense:  30 tablet    Refill:  5  . olmesartan-hydrochlorothiazide (BENICAR HCT) 20-12.5 MG tablet    Sig: Take 1 tablet by mouth daily.    Dispense:  30 tablet    Refill:  5  . fenofibrate (TRICOR) 145 MG tablet    Sig: Take 1 tablet (145 mg total) by mouth daily.    Dispense:  90 tablet    Refill:  1   Patient Instructions    For your fenofibrate it is okay to ask your pharmacist if there may be a less expensive option, but I will look at your cholesterol levels today so we can help make that decision.  Continue both fenofibrate and Lipitor for now.   No change in the blood pressure medicine today.  I will check the prediabetes test again that was slightly elevated back in April. Try to avoid sugar containing beverages like soda.   I do hear a slight wheeze on exam today.  Can try the albuterol few times per day if needed over the next couple of days, but if you require albuterol daily in the next week, or any worsening of symptoms please return to be seen here or urgent care/ER if needed.   Prediabetes Prediabetes is the condition of having a blood sugar (blood glucose) level  that is higher than it should be, but not high enough for you to be diagnosed with type 2 diabetes. Having prediabetes puts you at risk for developing type 2 diabetes (type 2 diabetes mellitus). Prediabetes may be called impaired glucose tolerance or impaired fasting glucose. Prediabetes usually does not cause symptoms. Your health care provider can diagnose this condition with blood tests. You may be tested for prediabetes if you are overweight and if you have at least one other risk factor for prediabetes. What is blood glucose, and how is it measured? Blood glucose refers to the amount of glucose  in your bloodstream. Glucose comes from eating foods that contain sugars and starches (carbohydrates), which the body breaks down into glucose. Your blood glucose level may be measured in mg/dL (milligrams per deciliter) or mmol/L (millimoles per liter). Your blood glucose may be checked with one or more of the following blood tests:  A fasting blood glucose (FBG) test. You will not be allowed to eat (you will fast) for 8 hours or longer before a blood sample is taken. ? A normal range for FBG is 70-100 mg/dl (3.9-5.6 mmol/L).  An A1c (hemoglobin A1c) blood test. This test provides information about blood glucose control over the previous 2?64months.  An oral glucose tolerance test (OGTT). This test measures your blood glucose at two times: ? After fasting. This is your baseline level. ? Two hours after you drink a beverage that contains glucose. You may be diagnosed with prediabetes:  If your FBG is 100?125 mg/dL (5.6-6.9 mmol/L).  If your A1c level is 5.7?6.4%.  If your OGTT result is 140?199 mg/dL (7.8-11 mmol/L). These blood tests may be repeated to confirm your diagnosis. How can this condition affect me? The pancreas produces a hormone (insulin) that helps to move glucose from the bloodstream into cells. When cells in the body do not respond properly to insulin that the body makes (insulin resistance), excess glucose builds up in the blood instead of going into cells. As a result, high blood glucose (hyperglycemia) can develop, which can cause many complications. Hyperglycemia is a symptom of prediabetes. Having high blood glucose for a long time is dangerous. Too much glucose in your blood can damage your nerves and blood vessels. Long-term damage can lead to complications from diabetes, which may include:  Heart disease.  Stroke.  Blindness.  Kidney disease.  Depression.  Poor circulation in the feet and legs, which could lead to surgical removal (amputation) in severe  cases. What can increase my risk? Risk factors for prediabetes include:  Having a family member with type 2 diabetes.  Being overweight or obese.  Being older than age 70.  Being of American [place], African-American, Hispanic/Latino, or Asian/Pacific Islander descent.  Having an inactive (sedentary) lifestyle.  Having a history of heart disease.  History of gestational diabetes or polycystic ovary syndrome (PCOS), in women.  Having low levels of good cholesterol (HDL-C) or high levels of blood fats (triglycerides).  Having high blood pressure. What actions can I take to prevent diabetes?      Be physically active. ? Do moderate-intensity physical activity for 30 or more minutes on 5 or more days of the week, or as much as told by your health care provider. This could be brisk walking, biking, or water aerobics. ? Ask your health care provider what activities are safe for you. A mix of physical activities may be best, such as walking, swimming, cycling, and strength training.  Lose weight as told by  your health care provider. ? Losing 5-7% of your body weight can reverse insulin resistance. ? Your health care provider can determine how much weight loss is best for you and can help you lose weight safely.  Follow a healthy meal plan. This includes eating lean proteins, complex carbohydrates, fresh fruits and vegetables, low-fat dairy products, and healthy fats. ? Follow instructions from your health care provider about eating or drinking restrictions. ? Make an appointment to see a diet and nutrition specialist (registered dietitian) to help you create a healthy eating plan that is right for you.  Do not smoke or use any tobacco products, such as cigarettes, chewing tobacco, and e-cigarettes. If you need help quitting, ask your health care provider.  Take over-the-counter and prescription medicines as told by your health care provider. You may be prescribed medicines that help  lower the risk of type 2 diabetes.  Keep all follow-up visits as told by your health care provider. This is important. Summary  Prediabetes is the condition of having a blood sugar (blood glucose) level that is higher than it should be, but not high enough for you to be diagnosed with type 2 diabetes.  Having prediabetes puts you at risk for developing type 2 diabetes (type 2 diabetes mellitus).  To help prevent type 2 diabetes, make lifestyle changes such as being physically active and eating a healthy diet. Lose weight as told by your health care provider. This information is not intended to replace advice given to you by your health care provider. Make sure you discuss any questions you have with your health care provider. Document Revised: 01/23/2019 Document Reviewed: 11/22/2015 Elsevier Patient Education  [employer].   If you have lab work done today you will be contacted with your lab results within the next 2 weeks.  If you have not heard from [place] then please contact us. The fastest way to get your results is to register for My Chart.   IF you received an x-ray today, you will receive an invoice from [facility]. Please contact [facility] at [phone] with questions or concerns regarding your invoice.   IF you received labwork today, you will receive an invoice from [facility]. Please contact [facility] at [phone] with questions or concerns regarding your invoice.   Our billing staff will not be able to assist you with questions regarding bills from these companies.  You will be contacted with the lab results as soon as they are available. The fastest way to get your results is to activate your My Chart account. Instructions are located on the last page of this paperwork. If you have not heard from [place] regarding the results in 2 weeks, please contact this office.         Signed, Merri Ray, MD [facility]

## 2020-08-13 ENCOUNTER — Emergency Department (HOSPITAL_BASED_OUTPATIENT_CLINIC_OR_DEPARTMENT_OTHER)
Admission: EM | Admit: 2020-08-13 | Discharge: 2020-08-13 | Disposition: A | Discharge status: Home / Self Care | Payer: Medicare Other | Attending: Emergency Medicine | Admitting: Emergency Medicine

## 2020-08-13 ENCOUNTER — Encounter (HOSPITAL_BASED_OUTPATIENT_CLINIC_OR_DEPARTMENT_OTHER): Payer: Self-pay | Admitting: *Deleted

## 2020-08-13 ENCOUNTER — Emergency Department (HOSPITAL_BASED_OUTPATIENT_CLINIC_OR_DEPARTMENT_OTHER)
Admission: EM | Admit: 2020-08-13 | Discharge: 2020-08-13 | Disposition: A | Discharge status: Home / Self Care | Payer: Medicare Other | Source: Home / Self Care | Attending: Emergency Medicine | Admitting: Emergency Medicine

## 2020-08-13 ENCOUNTER — Emergency Department (HOSPITAL_BASED_OUTPATIENT_CLINIC_OR_DEPARTMENT_OTHER): Payer: Medicare Other

## 2020-08-13 ENCOUNTER — Other Ambulatory Visit: Payer: Self-pay

## 2020-08-13 ENCOUNTER — Encounter (HOSPITAL_BASED_OUTPATIENT_CLINIC_OR_DEPARTMENT_OTHER): Payer: Self-pay | Admitting: Emergency Medicine

## 2020-08-13 DIAGNOSIS — Z79899 Other long term (current) drug therapy: Secondary | ICD-10-CM | POA: Insufficient documentation

## 2020-08-13 DIAGNOSIS — Z96653 Presence of artificial knee joint, bilateral: Secondary | ICD-10-CM | POA: Insufficient documentation

## 2020-08-13 DIAGNOSIS — Z96612 Presence of left artificial shoulder joint: Secondary | ICD-10-CM | POA: Insufficient documentation

## 2020-08-13 DIAGNOSIS — Z7951 Long term (current) use of inhaled steroids: Secondary | ICD-10-CM | POA: Insufficient documentation

## 2020-08-13 DIAGNOSIS — R112 Nausea with vomiting, unspecified: Secondary | ICD-10-CM | POA: Diagnosis not present

## 2020-08-13 DIAGNOSIS — N3000 Acute cystitis without hematuria: Secondary | ICD-10-CM

## 2020-08-13 DIAGNOSIS — J45909 Unspecified asthma, uncomplicated: Secondary | ICD-10-CM | POA: Insufficient documentation

## 2020-08-13 DIAGNOSIS — I700 Atherosclerosis of aorta: Secondary | ICD-10-CM | POA: Diagnosis not present

## 2020-08-13 DIAGNOSIS — I10 Essential (primary) hypertension: Secondary | ICD-10-CM | POA: Insufficient documentation

## 2020-08-13 DIAGNOSIS — Z8507 Personal history of malignant neoplasm of pancreas: Secondary | ICD-10-CM | POA: Insufficient documentation

## 2020-08-13 DIAGNOSIS — Z90710 Acquired absence of both cervix and uterus: Secondary | ICD-10-CM | POA: Diagnosis not present

## 2020-08-13 DIAGNOSIS — R1111 Vomiting without nausea: Secondary | ICD-10-CM

## 2020-08-13 DIAGNOSIS — Z20822 Contact with and (suspected) exposure to covid-19: Secondary | ICD-10-CM | POA: Insufficient documentation

## 2020-08-13 DIAGNOSIS — E876 Hypokalemia: Secondary | ICD-10-CM | POA: Diagnosis not present

## 2020-08-13 DIAGNOSIS — R1110 Vomiting, unspecified: Secondary | ICD-10-CM | POA: Diagnosis not present

## 2020-08-13 LAB — COMPREHENSIVE METABOLIC PANEL
ALT: 12 IU/L (ref 0–32)
ALT: 17 U/L (ref 0–44)
ALT: 16 U/L (ref 0–44)
AST: 15 IU/L (ref 0–40)
AST: 17 U/L (ref 15–41)
AST: 16 U/L (ref 15–41)
Albumin/Globulin Ratio: 2.1 (ref 1.2–2.2)
Albumin: 4.9 g/dL — ABNORMAL HIGH (ref 3.7–4.7)
Albumin: 4.7 g/dL (ref 3.5–5.0)
Albumin: 4.7 g/dL (ref 3.5–5.0)
Alkaline Phosphatase: 98 IU/L (ref 44–121)
Alkaline Phosphatase: 76 U/L (ref 38–126)
Alkaline Phosphatase: 71 U/L (ref 38–126)
Anion gap: 14 (ref 5–15)
Anion gap: 13 (ref 5–15)
BUN/Creatinine Ratio: 28 (ref 12–28)
BUN: 21 mg/dL (ref 8–27)
BUN: 22 mg/dL (ref 8–23)
BUN: 18 mg/dL (ref 8–23)
Bilirubin Total: 0.4 mg/dL (ref 0.0–1.2)
CO2: 22 mmol/L (ref 20–29)
CO2: 21 mmol/L — ABNORMAL LOW (ref 22–32)
CO2: 25 mmol/L (ref 22–32)
Calcium: 10.1 mg/dL (ref 8.7–10.3)
Calcium: 10.2 mg/dL (ref 8.9–10.3)
Calcium: 10 mg/dL (ref 8.9–10.3)
Chloride: 101 mmol/L (ref 96–106)
Chloride: 101 mmol/L (ref 98–111)
Chloride: 100 mmol/L (ref 98–111)
Creatinine, Ser: 0.75 mg/dL (ref 0.57–1.00)
Creatinine, Ser: 0.75 mg/dL (ref 0.44–1.00)
Creatinine, Ser: 0.69 mg/dL (ref 0.44–1.00)
GFR calc Af Amer: 90 mL/min/{1.73_m2} (ref >59)
GFR calc non Af Amer: 78 mL/min/{1.73_m2} (ref >59)
GFR, Estimated: >60 mL/min (ref >60)
GFR, Estimated: >60 mL/min (ref >60)
Globulin, Total: 2.3 g/dL (ref 1.5–4.5)
Glucose, Bld: 124 mg/dL — ABNORMAL HIGH (ref 70–99)
Glucose, Bld: 127 mg/dL — ABNORMAL HIGH (ref 70–99)
Glucose: 98 mg/dL (ref 65–99)
Potassium: 4 mmol/L (ref 3.5–5.2)
Potassium: 3.2 mmol/L — ABNORMAL LOW (ref 3.5–5.1)
Potassium: 3.5 mmol/L (ref 3.5–5.1)
Sodium: 139 mmol/L (ref 134–144)
Sodium: 136 mmol/L (ref 135–145)
Sodium: 138 mmol/L (ref 135–145)
Total Bilirubin: 0.4 mg/dL (ref 0.3–1.2)
Total Bilirubin: 0.7 mg/dL (ref 0.3–1.2)
Total Protein: 7.2 g/dL (ref 6.0–8.5)
Total Protein: 7.8 g/dL (ref 6.5–8.1)
Total Protein: 7.6 g/dL (ref 6.5–8.1)

## 2020-08-13 LAB — HEMOGLOBIN A1C
Est. average glucose Bld gHb Est-mCnc: 108 mg/dL
Hgb A1c MFr Bld: 5.4 % (ref 4.8–5.6)

## 2020-08-13 LAB — LIPID PANEL
Chol/HDL Ratio: 2.8 ratio (ref 0.0–4.4)
Cholesterol, Total: 182 mg/dL (ref 100–199)
HDL: 65 mg/dL (ref >39)
LDL Chol Calc (NIH): 93 mg/dL (ref 0–99)
Triglycerides: 141 mg/dL (ref 0–149)
VLDL Cholesterol Cal: 24 mg/dL (ref 5–40)

## 2020-08-13 LAB — CBC WITH DIFFERENTIAL/PLATELET
Abs Immature Granulocytes: 0.02 10*3/uL (ref 0.00–0.07)
Basophils Absolute: 0 10*3/uL (ref 0.0–0.1)
Basophils Relative: 1 %
Eosinophils Absolute: 0 10*3/uL (ref 0.0–0.5)
Eosinophils Relative: 0 %
HCT: 38.7 % (ref 36.0–46.0)
Hemoglobin: 13.3 g/dL (ref 12.0–15.0)
Immature Granulocytes: 0 %
Lymphocytes Relative: 23 %
Lymphs Abs: 1.7 10*3/uL (ref 0.7–4.0)
MCH: 32.4 pg (ref 26.0–34.0)
MCHC: 34.4 g/dL (ref 30.0–36.0)
MCV: 94.4 fL (ref 80.0–100.0)
Monocytes Absolute: 0.5 10*3/uL (ref 0.1–1.0)
Monocytes Relative: 7 %
Neutro Abs: 5.4 10*3/uL (ref 1.7–7.7)
Neutrophils Relative %: 69 %
Platelets: 351 10*3/uL (ref 150–400)
RBC: 4.1 MIL/uL (ref 3.87–5.11)
RDW: 11.9 % (ref 11.5–15.5)
WBC: 7.7 10*3/uL (ref 4.0–10.5)
nRBC: 0 % (ref 0.0–0.2)

## 2020-08-13 LAB — URINALYSIS, MICROSCOPIC (REFLEX)

## 2020-08-13 LAB — LIPASE, BLOOD
Lipase: 32 U/L (ref 11–51)
Lipase: 29 U/L (ref 11–51)

## 2020-08-13 LAB — URINALYSIS, ROUTINE W REFLEX MICROSCOPIC
Bilirubin Urine: NEGATIVE
Bilirubin Urine: NEGATIVE
Glucose, UA: NEGATIVE mg/dL
Glucose, UA: NEGATIVE mg/dL
Hgb urine dipstick: NEGATIVE
Ketones, ur: NEGATIVE mg/dL
Ketones, ur: NEGATIVE mg/dL
Nitrite: NEGATIVE
Nitrite: NEGATIVE
Protein, ur: NEGATIVE mg/dL
Protein, ur: NEGATIVE mg/dL
Specific Gravity, Urine: 1.025 (ref 1.005–1.030)
Specific Gravity, Urine: 1.02 (ref 1.005–1.030)
pH: 5.5 (ref 5.0–8.0)
pH: 6 (ref 5.0–8.0)

## 2020-08-13 LAB — CBC
HCT: 38.6 % (ref 36.0–46.0)
Hemoglobin: 13.1 g/dL (ref 12.0–15.0)
MCH: 32.4 pg (ref 26.0–34.0)
MCHC: 33.9 g/dL (ref 30.0–36.0)
MCV: 95.5 fL (ref 80.0–100.0)
Platelets: 350 10*3/uL (ref 150–400)
RBC: 4.04 MIL/uL (ref 3.87–5.11)
RDW: 12.2 % (ref 11.5–15.5)
WBC: 8.1 10*3/uL (ref 4.0–10.5)
nRBC: 0 % (ref 0.0–0.2)

## 2020-08-13 LAB — RESPIRATORY PANEL BY RT PCR (FLU A&B, COVID)
Influenza A by PCR: NEGATIVE
Influenza B by PCR: NEGATIVE
SARS Coronavirus 2 by RT PCR: NEGATIVE

## 2020-08-13 MED ORDER — PROMETHAZINE HCL 25 MG/ML IJ SOLN
12.5000 mg | Freq: Once | INTRAMUSCULAR | Status: AC
  Administered 2020-08-13: 12.5 mg via INTRAVENOUS
  Filled 2020-08-13: qty 1

## 2020-08-13 MED ORDER — HALOPERIDOL LACTATE 5 MG/ML IJ SOLN: 5.0000 mg | Freq: Once | INTRAMUSCULAR | Status: DC

## 2020-08-13 MED ORDER — CEPHALEXIN 500 MG PO CAPS: 500.0000 mg | ORAL_CAPSULE | Freq: Two times a day (BID) | ORAL | 0 refills | Status: AC

## 2020-08-13 MED ORDER — POTASSIUM CHLORIDE CRYS ER 20 MEQ PO TBCR: 20.0000 meq | EXTENDED_RELEASE_TABLET | Freq: Two times a day (BID) | ORAL | 0 refills | Status: DC

## 2020-08-13 MED ORDER — METOCLOPRAMIDE HCL 5 MG/ML IJ SOLN
10.0000 mg | Freq: Once | INTRAMUSCULAR | Status: AC
  Administered 2020-08-13: 10 mg via INTRAVENOUS
  Filled 2020-08-13: qty 2

## 2020-08-13 MED ORDER — HALOPERIDOL LACTATE 5 MG/ML IJ SOLN
2.0000 mg | Freq: Once | INTRAMUSCULAR | Status: AC
  Administered 2020-08-13: 2 mg via INTRAVENOUS
  Filled 2020-08-13: qty 1

## 2020-08-13 MED ORDER — PROMETHAZINE HCL 25 MG PO TABS: 25.0000 mg | ORAL_TABLET | Freq: Four times a day (QID) | ORAL | 0 refills | Status: DC | PRN

## 2020-08-13 MED ORDER — LACTATED RINGERS IV BOLUS
1000.0000 mL | Freq: Once | INTRAVENOUS | Status: AC
  Administered 2020-08-13: 1000 mL via INTRAVENOUS

## 2020-08-13 MED ORDER — ONDANSETRON HCL 4 MG/2ML IJ SOLN
4.0000 mg | Freq: Once | INTRAMUSCULAR | Status: AC
  Administered 2020-08-13: 4 mg via INTRAVENOUS
  Filled 2020-08-13: qty 2

## 2020-08-13 MED ORDER — SODIUM CHLORIDE 0.9 % IV BOLUS
1000.0000 mL | Freq: Once | INTRAVENOUS | Status: AC
  Administered 2020-08-13: 1000 mL via INTRAVENOUS

## 2020-08-13 MED ORDER — DIPHENHYDRAMINE HCL 50 MG/ML IJ SOLN
25.0000 mg | Freq: Once | INTRAMUSCULAR | Status: AC
  Administered 2020-08-13: 25 mg via INTRAVENOUS
  Filled 2020-08-13: qty 1

## 2020-08-13 NOTE — ED Provider Notes (Signed)
[facility] EMERGENCY DEPARTMENT Provider Note   CSN: [id number] Arrival date & time: 08/13/20  1559     History Chief Complaint  Patient presents with  . Emesis    [name] is a 74 y.o. female.  The history is provided by the patient.  Emesis Severity:  Moderate Timing:  Constant Progression:  Worsening Chronicity:  Recurrent Recent urination:  Normal Relieved by:  Nothing Worsened by:  Nothing Ineffective treatments:  Antiemetics Associated symptoms: no abdominal pain, no arthralgias, no chills, no cough, no fever and no sore throat   Risk factors comment:  Hx of intractable n/v      Past Medical History:  Diagnosis Date  . Anemia   . Arthritis   . Asthma   . Cancer ([place])    Pancreas  . GERD (gastroesophageal reflux disease)    at times  . Hyperlipidemia   . Hypertension   . Peripheral vascular disease ([place])    has spider veins  . Pneumonia   . Pneumothorax, traumatic    RIGHT; associated with multiple rib fractures after a fall    Patient Active Problem List   Diagnosis Date Noted  . Prediabetes 02/14/2020  . Essential hypertension 08/19/2017  . Hyperlipidemia 08/19/2017  . Cough variant asthma 04/04/2016  . Upper airway cough syndrome 04/04/2016  . 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 ABDOMINAL HYSTERECTOMY  1982  . AUGMENTATION MAMMAPLASTY Bilateral    Patient has bilateral implants   . 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 BREAST SURGERY    . ESOPHAGOGASTRODUODENOSCOPY (EGD) WITH PROPOFOL N/A 01/05/2020   Procedure: ESOPHAGOGASTRODUODENOSCOPY (EGD) WITH PROPOFOL;  Surgeon: Arta Silence, MD;  Location: [facility];  Service: Endoscopy;  Laterality: N/A;  . EUS N/A 01/05/2020   Procedure: UPPER ENDOSCOPIC ULTRASOUND (EUS) LINEAR;  Surgeon: Arta Silence, MD;  Location: [facility];  Service: Endoscopy;  Laterality: N/A;  . FINE NEEDLE ASPIRATION N/A  01/05/2020   Procedure: FINE NEEDLE ASPIRATION (FNA) LINEAR;  Surgeon: Arta Silence, MD;  [facility];  Service: Endoscopy;  Laterality: N/A;  . JOINT REPLACEMENT  2009   right knee  . JOINT REPLACEMENT  2011   left knee  . MASTECTOMY  2008   right breast  . REDUCTION MAMMAPLASTY Left    Patient had lift with implant on Left side   . SHOULDER ARTHROSCOPY DISTAL CLAVICLE EXCISION AND OPEN ROTATOR CUFF REPAIR  2011  . TOTAL SHOULDER ARTHROPLASTY  03/14/2012   Procedure: TOTAL SHOULDER ARTHROPLASTY;  Surgeon: Alta Corning, MD;  Location: [place];  Service: Orthopedics;  Laterality: Left;     OB History   No obstetric history on file.     Family History  Problem Relation Age of Onset  . Breast cancer Sister   . Rheum arthritis Sister   . Breast cancer Paternal Grandmother   . Kidney Stones Mother   . CVA Mother   . Hypertension Father   . Anesthesia problems Neg Hx   . Hypotension Neg Hx   . Malignant hyperthermia Neg Hx   . Pseudochol deficiency Neg Hx     Social History   Tobacco Use  . Smoking status: Never Smoker  . Smokeless tobacco: Never Used  Vaping Use  . Vaping Use: Never used  Substance Use Topics  . Alcohol use: No    Alcohol/week: 0.0 standard drinks  . Drug use: No  Home Medications Prior to Admission medications   Medication Sig Start Date End Date Taking? Authorizing Provider  acetaminophen (TYLENOL) 325 MG tablet Take 650 mg by mouth every 6 (six) hours as needed for mild pain or moderate pain.     [provider]  albuterol (PROAIR HFA) 108 (90 Base) MCG/ACT inhaler Inhale 2 puffs into the lungs every 6 (six) hours as needed for wheezing or shortness of breath. 08/12/20   [name], MD  atorvastatin (LIPITOR) 20 MG tablet Take 1 tablet (20 mg total) by mouth daily at 6 PM. 08/12/20   [name], MD  cephALEXin (KEFLEX) 500 MG capsule Take 1 capsule (500 mg total) by mouth 2 (two) times daily for 5 days. 08/13/20  08/18/20  Isael Stille, DO  Dextromethorphan-Guaifenesin 20-400 MG TABS Take 1 tablet by mouth at bedtime.     [provider]  diphenhydrAMINE (SOMINEX) 25 MG tablet Take 50 mg by mouth at bedtime.     [provider]  fenofibrate (TRICOR) 145 MG tablet Take 1 tablet (145 mg total) by mouth daily. 08/12/20   [name], MD  fluticasone-salmeterol (ADVAIR HFA) [phone] MCG/ACT inhaler Inhale 2 puffs into the lungs 2 (two) times daily. 08/12/20   [name], MD  gabapentin (NEURONTIN) 300 MG capsule 1 po BID prn. Patient taking differently: Take 300 mg by mouth 3 (three) times daily.  07/01/19   [name], MD  ibuprofen (ADVIL,MOTRIN) 100 MG tablet Take 100 mg by mouth every 6 (six) hours as needed for pain.     [provider]  Multiple Vitamin (MULTIVITAMIN) capsule Take 1 capsule by mouth daily.     [provider]  olmesartan-hydrochlorothiazide (BENICAR HCT) 20-12.5 MG tablet Take 1 tablet by mouth daily. 08/12/20   [name], MD  oxymetazoline (AFRIN) 0.05 % nasal spray Place 1 spray into both nostrils at bedtime.     [provider]  potassium chloride SA (KLOR-CON) 20 MEQ tablet Take 1 tablet (20 mEq total) by mouth 2 (two) times daily. 71/24/58   Delora Fuel, MD  promethazine (PHENERGAN) 25 MG tablet Take 1 tablet (25 mg total) by mouth every 6 (six) hours as needed for nausea or vomiting. 09/98/33   Delora Fuel, MD  Respiratory Therapy Supplies (FLUTTER) [place] 1 each by Does not apply route 2 (two) times daily. 07/15/19   Julian Hy, DO    Allergies    Dilaudid [hydromorphone hcl] and Morphine and related  Review of Systems   Review of Systems  Constitutional: Negative for chills and fever.  HENT: Negative for ear pain and sore throat.   Eyes: Negative for pain and visual disturbance.  Respiratory: Negative for cough and shortness of breath.   Cardiovascular: Negative for chest pain and palpitations.   Gastrointestinal: Positive for nausea and vomiting. Negative for abdominal pain.  Genitourinary: Negative for dysuria and hematuria.  Musculoskeletal: Negative for arthralgias and back pain.  Skin: Negative for color change and rash.  Neurological: Negative for seizures and syncope.  All other systems reviewed and are negative.   Physical Exam Updated Vital Signs  ED Triage Vitals  Enc Vitals Group     BP 08/13/20 1611 (!) 174/80     Pulse --      Resp 08/13/20 1611 20     Temp 08/13/20 1611 98.8 F (37.1 C)     Temp Source 08/13/20 1611 Oral     SpO2 08/13/20 1611 94 %  Weight 08/13/20 1613 171 lb 15.3 oz (78 kg)     Height 08/13/20 1613 5\' 4"  (1.626 m)     Head Circumference --      Peak Flow --      Pain Score 08/13/20 1612 0     Pain Loc --      Pain Edu? --      Excl. in [place]? --     Physical Exam Vitals and nursing note reviewed.  Constitutional:      General: She is not in acute distress.    Appearance: She is well-developed. She is ill-appearing.  HENT:     Head: Normocephalic and atraumatic.     Nose: Nose normal.     Mouth/Throat:     Mouth: Mucous membranes are moist.  Eyes:     Extraocular Movements: Extraocular movements intact.     Conjunctiva/sclera: Conjunctivae normal.     Pupils: Pupils are equal, round, and reactive to light.  Cardiovascular:     Rate and Rhythm: Normal rate and regular rhythm.     Pulses: Normal pulses.     Heart sounds: Normal heart sounds. No murmur heard.   Pulmonary:     Effort: Pulmonary effort is normal. No respiratory distress.     Breath sounds: Normal breath sounds.  Abdominal:     Palpations: Abdomen is soft.     Tenderness: There is no abdominal tenderness.  Musculoskeletal:     Cervical back: Normal range of motion and neck supple.  Skin:    General: Skin is warm and dry.     Capillary Refill: Capillary refill takes less than 2 seconds.  Neurological:     General: No focal deficit present.     Mental  Status: She is alert.     ED Results / Procedures / Treatments   Labs (all labs ordered are listed, but only abnormal results are displayed) Labs Reviewed  COMPREHENSIVE METABOLIC PANEL - Abnormal; Notable for the following components:      Result Value   Glucose, Bld 127 (*)    All other components within normal limits  URINALYSIS, ROUTINE W REFLEX MICROSCOPIC - Abnormal; Notable for the following components:   APPearance HAZY (*)    Hgb urine dipstick TRACE (*)    Leukocytes,Ua SMALL (*)    All other components within normal limits  URINALYSIS, MICROSCOPIC (REFLEX) - Abnormal; Notable for the following components:   Bacteria, UA MANY (*)    All other components within normal limits  RESPIRATORY PANEL BY RT PCR (FLU A&B, COVID)  URINE CULTURE  CBC WITH DIFFERENTIAL/PLATELET  LIPASE, BLOOD    EKG EKG Interpretation  Date/Time:  Saturday August 13 2020 16:22:48 EDT Ventricular Rate:  84 PR Interval:    QRS Duration: 90 QT Interval:  392 QTC Calculation: 464 R Axis:   58 Text Interpretation: Sinus rhythm Low voltage, precordial leads Confirmed by Lennice Sites [phone]) on 08/13/2020 5:44:06 PM   Radiology CT ABDOMEN PELVIS WO CONTRAST  Result Date: 08/13/2020 CLINICAL DATA:  Nausea and vomiting. EXAM: CT ABDOMEN AND PELVIS WITHOUT CONTRAST TECHNIQUE: Multidetector CT imaging of the abdomen and pelvis was performed following the standard protocol without IV contrast. COMPARISON:  CT abdomen pelvis dated 10/07/2019. FINDINGS: Lower chest: Pleuroparenchymal scarring of the lateral right lung base is unchanged. Hepatobiliary: No focal liver abnormality is seen. No gallstones, gallbladder wall thickening, or biliary dilatation. Pancreas: A an 11 mm mass in the body of the pancreas is not significantly changed since 2009  and therefore benign (series 2, image 19). No pancreatic ductal dilatation or surrounding inflammatory changes. Spleen: Normal in size without focal abnormality.  Adrenals/Urinary Tract: Adrenal glands are unremarkable. Kidneys are normal, without renal calculi, focal lesion, or hydronephrosis. Bladder is unremarkable. Stomach/Bowel: Stomach is within normal limits. Appendix appears normal. No evidence of bowel wall thickening, distention, or inflammatory changes. Vascular/Lymphatic: Aortic atherosclerosis. No enlarged abdominal or pelvic lymph nodes. Reproductive: Status post hysterectomy. No adnexal masses. Other: No abdominal wall hernia or abnormality. No abdominopelvic ascites. Musculoskeletal: Degenerative changes are seen in the spine. IMPRESSION: 1. No findings to explain the patient's symptoms. Aortic Atherosclerosis (ICD10-I70.0). Electronically Signed   By: Zerita Boers M.D.   On: 08/13/2020 17:07    Procedures Procedures (including critical care time)  Medications Ordered in ED Medications  sodium chloride 0.9 % bolus 1,000 mL ( Intravenous Stopped 08/13/20 1813)  haloperidol lactate (HALDOL) injection 2 mg (2 mg Intravenous Given 08/13/20 1715)  ondansetron (ZOFRAN) injection 4 mg (4 mg Intravenous Given 08/13/20 1707)  promethazine (PHENERGAN) injection 12.5 mg (12.5 mg Intravenous Given 08/13/20 1754)    ED Course  I have reviewed the triage vital signs and the nursing notes.  Pertinent labs & imaging results that were available during my care of the patient were reviewed by me and considered in my medical decision making (see chart for details).    MDM Rules/Calculators/A&P                          [name] is a 74 year old female with history of reflux, high cholesterol, hypertension who presents to the ED with nausea and vomiting.  Was seen here overnight for the same.  Felt better and was discharged.  Took Phenergan this morning but continues to have vomiting and nausea.  No real focal abdominal pain.  Patient overall appears well but states that she is having a hard time with p.o.  No focal abdominal tenderness on exam we  will get a CT scan to further evaluate for causes of nausea and vomiting.  Has a history of intractable nausea and vomiting in the past.  EKG shows sinus rhythm.  No ischemic changes.  Normal intervals.  No chest pain or shortness of breath.  Will give IV fluids, IV Haldol, IV Zofran and reevaluate.  Patient overall with unremarkable work-up.  No bowel obstruction, no pancreatitis, no significant anemia, electrolyte abnormality.  Suspect may be urinary tract infection.  Will start antibiotics.  Felt better after IV fluids and antiemetics.  Wanted to be discharged to home.  Given return precautions.  This chart was dictated using voice recognition software.  Despite best efforts to proofread,  errors can occur which can change the documentation meaning.    Final Clinical Impression(s) / ED Diagnoses Final diagnoses:  Vomiting without nausea, intractability of vomiting not specified, unspecified vomiting type  Acute cystitis without hematuria    Rx / DC Orders ED Discharge Orders         Ordered    cephALEXin (KEFLEX) 500 MG capsule  2 times daily        08/13/20 1907           Marlita Keil, [address], DO 08/13/20 1907

## 2020-08-13 NOTE — ED Triage Notes (Signed)
Patient arrived via POV c/o nausea and vomiting x 2 hrs. Patient states nausea started 2130 with emesis at 2300. Patient states 12 episodes of emesis with varying volume. Patient denies pain. Patient is AO x 4, VS WDL, unstable gait.

## 2020-08-13 NOTE — ED Notes (Signed)
Patient transported to CT 

## 2020-08-13 NOTE — ED Triage Notes (Signed)
Pt seen here last night for same sx. States she didn't feel well Thursday then began with n/v/ yesterday. States she took 2 phenergan tablets at 1100 without relief

## 2020-08-13 NOTE — Discharge Instructions (Addendum)
I have also prescribed antibiotic for possible urinary tract infection.  Please follow-up with your primary care doctor.  Continue nausea medications as prescribed earlier.

## 2020-08-13 NOTE — ED Provider Notes (Signed)
[facility] EMERGENCY DEPARTMENT Provider Note   CSN: [id number] Arrival date & time: 08/13/20  0055    History Chief Complaint  Patient presents with  . Emesis    [name] is a 74 y.o. female.  The history is provided by the patient.  Emesis She has history of hypertension, hyperlipidemia and comes in because of nausea and vomiting.  She started developing nausea this evening and started vomiting about 2 hours later.  She has vomited at least a dozen times.  She denies abdominal pain and there has been no diarrhea.  She denies fever, chills, sweats.  She has had similar episodes in the past.  Past Medical History:  Diagnosis Date  . Anemia   . Arthritis   . Asthma   . Cancer ([place])    Pancreas  . GERD (gastroesophageal reflux disease)    at times  . Hyperlipidemia   . Hypertension   . Peripheral vascular disease ([place])    has spider veins  . Pneumonia   . Pneumothorax, traumatic    RIGHT; associated with multiple rib fractures after a fall    Patient Active Problem List   Diagnosis Date Noted  . Prediabetes 02/14/2020  . Essential hypertension 08/19/2017  . Hyperlipidemia 08/19/2017  . Cough variant asthma 04/04/2016  . Upper airway cough syndrome 04/04/2016  . 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 ABDOMINAL HYSTERECTOMY  1982  . AUGMENTATION MAMMAPLASTY Bilateral    Patient has bilateral implants   . 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 BREAST SURGERY    . ESOPHAGOGASTRODUODENOSCOPY (EGD) WITH PROPOFOL N/A 01/05/2020   Procedure: ESOPHAGOGASTRODUODENOSCOPY (EGD) WITH PROPOFOL;  Surgeon: Arta Silence, MD;  [facility];  Service: Endoscopy;  Laterality: N/A;  . EUS N/A 01/05/2020   Procedure: UPPER ENDOSCOPIC ULTRASOUND (EUS) LINEAR;  Surgeon: Arta Silence, MD;  [facility];  Service: Endoscopy;  Laterality: N/A;  . FINE NEEDLE ASPIRATION N/A 01/05/2020    Procedure: FINE NEEDLE ASPIRATION (FNA) LINEAR;  Surgeon: Arta Silence, MD;  [facility];  Service: Endoscopy;  Laterality: N/A;  . JOINT REPLACEMENT  2009   right knee  . JOINT REPLACEMENT  2011   left knee  . MASTECTOMY  2008   right breast  . REDUCTION MAMMAPLASTY Left    Patient had lift with implant on Left side   . SHOULDER ARTHROSCOPY DISTAL CLAVICLE EXCISION AND OPEN ROTATOR CUFF REPAIR  2011  . TOTAL SHOULDER ARTHROPLASTY  03/14/2012   Procedure: TOTAL SHOULDER ARTHROPLASTY;  Surgeon: Alta Corning, MD;  Location: [facility];  Service: Orthopedics;  Laterality: Left;     OB History   No obstetric history on file.     Family History  Problem Relation Age of Onset  . Breast cancer Sister   . Rheum arthritis Sister   . Breast cancer Paternal Grandmother   . Kidney Stones Mother   . CVA Mother   . Hypertension Father   . Anesthesia problems Neg Hx   . Hypotension Neg Hx   . Malignant hyperthermia Neg Hx   . Pseudochol deficiency Neg Hx     Social History   Tobacco Use  . Smoking status: Never Smoker  . Smokeless tobacco: Never Used  Vaping Use  . Vaping Use: Never used  Substance Use Topics  . Alcohol use: No    Alcohol/week: 0.0 standard drinks  . Drug use: No  Home Medications Prior to Admission medications   Medication Sig Start Date End Date Taking? Authorizing Provider  acetaminophen (TYLENOL) 325 MG tablet Take 650 mg by mouth every 6 (six) hours as needed for mild pain or moderate pain.     [provider]  albuterol (PROAIR HFA) 108 (90 Base) MCG/ACT inhaler Inhale 2 puffs into the lungs every 6 (six) hours as needed for wheezing or shortness of breath. 08/12/20   [name], MD  atorvastatin (LIPITOR) 20 MG tablet Take 1 tablet (20 mg total) by mouth daily at 6 PM. 08/12/20   [name], MD  Dextromethorphan-Guaifenesin 20-400 MG TABS Take 1 tablet by mouth at bedtime.     [provider]  diphenhydrAMINE  (SOMINEX) 25 MG tablet Take 50 mg by mouth at bedtime.     [provider]  fenofibrate (TRICOR) 145 MG tablet Take 1 tablet (145 mg total) by mouth daily. 08/12/20   [name], MD  fluticasone-salmeterol (ADVAIR HFA) [phone] MCG/ACT inhaler Inhale 2 puffs into the lungs 2 (two) times daily. 08/12/20   [name], MD  gabapentin (NEURONTIN) 300 MG capsule 1 po BID prn. Patient taking differently: Take 300 mg by mouth 3 (three) times daily.  07/01/19   [name], MD  HYDROcodone-acetaminophen (NORCO/VICODIN) 5-325 MG tablet Take 1-2 tablets by mouth every 6 (six) hours as needed. Patient not taking: Reported on 06/14/2020 03/12/20   Malvin Johns, MD  ibuprofen (ADVIL,MOTRIN) 100 MG tablet Take 100 mg by mouth every 6 (six) hours as needed for pain.     [provider]  meclizine (ANTIVERT) 25 MG tablet Take 1 tablet (25 mg total) by mouth 3 (three) times daily as needed for dizziness. Patient not taking: Reported on 04/25/2020 04/14/18   [name], MD  Multiple Vitamin (MULTIVITAMIN) capsule Take 1 capsule by mouth daily.     [provider]  olmesartan-hydrochlorothiazide (BENICAR HCT) 20-12.5 MG tablet Take 1 tablet by mouth daily. 08/12/20   [name], MD  oxymetazoline (AFRIN) 0.05 % nasal spray Place 1 spray into both nostrils at bedtime.     [provider]  promethazine (PHENERGAN) 12.5 MG tablet Take 1 tablet (12.5 mg total) by mouth every 6 (six) hours as needed for nausea or vomiting. Patient not taking: Reported on 08/12/2020 06/29/20   Lajean Saver, MD  Respiratory Therapy Supplies (FLUTTER) [place] 1 each by Does not apply route 2 (two) times daily. 07/15/19   Julian Hy, DO  tiZANidine (ZANAFLEX) 2 MG tablet Take 2 mg by mouth 3 (three) times daily.  Patient not taking: Reported on 06/14/2020 12/25/19   [provider]    Allergies    Dilaudid [hydromorphone hcl] and Morphine and related  Review of  Systems   Review of Systems  Gastrointestinal: Positive for vomiting.  All other systems reviewed and are negative.   Physical Exam Updated Vital Signs BP (!) 152/72 (BP Location: Right Arm)   Pulse 93   Temp 98.2 F (36.8 C)   Resp 20   Ht 5\' 4"  (1.626 m)   Wt 78 kg   SpO2 92%   BMI 29.52 kg/m   Physical Exam Vitals and nursing note reviewed.   74 year old female, resting comfortably and in no acute distress. Vital signs are significant for elevated blood pressure. Oxygen saturation is 92%, which is normal. Head is normocephalic and atraumatic. PERRLA, EOMI. Oropharynx is clear. Neck is nontender and supple without  adenopathy or JVD. Back is nontender and there is no CVA tenderness. Lungs are clear without rales, wheezes, or rhonchi. Chest is nontender. Heart has regular rate and rhythm without murmur. Abdomen is soft, flat, nontender without masses or hepatosplenomegaly and peristalsis is hypoactive. Extremities have no cyanosis or edema, full range of motion is present. Skin is warm and dry without rash. Neurologic: Mental status is normal, cranial nerves are intact, there are no motor or sensory deficits.  ED Results / Procedures / Treatments   Labs (all labs ordered are listed, but only abnormal results are displayed) Labs Reviewed  COMPREHENSIVE METABOLIC PANEL - Abnormal; Notable for the following components:      Result Value   Potassium 3.2 (*)    CO2 21 (*)    Glucose, Bld 124 (*)    All other components within normal limits  URINALYSIS, ROUTINE W REFLEX MICROSCOPIC - Abnormal; Notable for the following components:   Leukocytes,Ua MODERATE (*)    All other components within normal limits  URINALYSIS, MICROSCOPIC (REFLEX) - Abnormal; Notable for the following components:   Bacteria, UA FEW (*)    All other components within normal limits  LIPASE, BLOOD  CBC    EKG EKG Interpretation  Date/Time:  Saturday August 13 2020 01:26:28 EDT Ventricular Rate:   91 PR Interval:    QRS Duration: 93 QT Interval:  381 QTC Calculation: 469 R Axis:   40 Text Interpretation: Sinus rhythm Probable left atrial enlargement Otherwise within normal limits When compared with ECG of 06/29/2020, No significant change was found Confirmed by Delora Fuel ([id number]) on 08/13/2020 1:29:31 AM  Procedures Procedures  Medications Ordered in ED Medications  promethazine (PHENERGAN) injection 12.5 mg (12.5 mg Intravenous Given 08/13/20 0131)  lactated ringers bolus 1,000 mL (1,000 mLs Intravenous New Bag/Given 08/13/20 0348)  metoCLOPramide (REGLAN) injection 10 mg (10 mg Intravenous Given 08/13/20 0335)  diphenhydrAMINE (BENADRYL) injection 25 mg (25 mg Intravenous Given 08/13/20 0335)  promethazine (PHENERGAN) injection 12.5 mg (12.5 mg Intravenous Given 08/13/20 0406)    ED Course  I have reviewed the triage vital signs and the nursing notes.  Pertinent lab results that were available during my care of the patient were reviewed by me and considered in my medical decision making (see chart for details).  MDM Rules/Calculators/A&P Nausea and vomiting of uncertain cause.  Old records are reviewed, and she is noted to have had several prior ED visits for non-intractable nausea and vomiting, but these visits are infrequent.  Last ED visit for nausea and vomiting was 06/29/2020.  She was given a dose of promethazine and noted some improvement, but nausea starting to return.  She will be given IV fluids and metoclopramide.  Labs are significant for mild hypokalemia, presumably secondary to GI losses.  Following metoclopramide, nausea actually got worse and she resumed dry heaving.  She was given additional promethazine with complete relief of symptoms.  She is discharged with prescription for promethazine and K-Dur.  Follow-up with PCP.  Final Clinical Impression(s) / ED Diagnoses Final diagnoses:  Non-intractable vomiting with nausea, unspecified vomiting type   Hypokalemia due to excessive gastrointestinal loss of potassium    Rx / DC Orders ED Discharge Orders         Ordered    potassium chloride SA (KLOR-CON) 20 MEQ tablet  2 times daily        08/13/20 0510    promethazine (PHENERGAN) 25 MG tablet  Every 6 hours PRN  08/13/20 2542           Delora Fuel, MD 70/62/37 [phone]

## 2020-08-15 ENCOUNTER — Encounter: Payer: Self-pay | Admitting: Family Medicine

## 2020-08-15 LAB — URINE CULTURE

## 2020-08-16 ENCOUNTER — Encounter (HOSPITAL_COMMUNITY)
Admission: RE | Admit: 2020-08-16 | Discharge: 2020-08-16 | Disposition: A | Discharge status: Home / Self Care | Payer: Medicare Other | Source: Ambulatory Visit | Attending: Orthopedic Surgery | Admitting: Orthopedic Surgery

## 2020-08-16 NOTE — Progress Notes (Signed)
I called Pt for her PAT visit and she said that she has been sick with the stomach flu and needs to reschedule.

## 2020-08-17 ENCOUNTER — Other Ambulatory Visit: Payer: Self-pay

## 2020-08-17 ENCOUNTER — Encounter (HOSPITAL_COMMUNITY)
Admission: RE | Admit: 2020-08-17 | Discharge: 2020-08-17 | Disposition: A | Discharge status: Home / Self Care | Payer: Medicare Other | Source: Ambulatory Visit | Attending: Orthopedic Surgery | Admitting: Orthopedic Surgery

## 2020-08-17 ENCOUNTER — Encounter (HOSPITAL_COMMUNITY): Payer: Self-pay

## 2020-08-17 DIAGNOSIS — Z01812 Encounter for preprocedural laboratory examination: Secondary | ICD-10-CM | POA: Diagnosis not present

## 2020-08-17 HISTORY — DX: Prediabetes: R73.03

## 2020-08-17 HISTORY — DX: Other specified postprocedural states: Z98.890

## 2020-08-17 HISTORY — DX: Nausea with vomiting, unspecified: R11.2

## 2020-08-17 HISTORY — DX: Other complications of anesthesia, initial encounter: T88.59XA

## 2020-08-17 LAB — PROTIME-INR
INR: 1 (ref 0.8–1.2)
Prothrombin Time: 12.8 seconds (ref 11.4–15.2)

## 2020-08-17 LAB — SURGICAL PCR SCREEN
MRSA, PCR: NEGATIVE
Staphylococcus aureus: POSITIVE — AB

## 2020-08-17 LAB — GLUCOSE, CAPILLARY: Glucose-Capillary: 120 mg/dL — ABNORMAL HIGH (ref 70–99)

## 2020-08-17 LAB — APTT: aPTT: 30 seconds (ref 24–36)

## 2020-08-17 NOTE — Patient Instructions (Addendum)
DUE TO COVID-19 ONLY ONE VISITOR IS ALLOWED TO COME WITH YOU AND STAY IN THE WAITING ROOM ONLY DURING PRE OP AND PROCEDURE DAY OF SURGERY. THE 1 VISITOR  MAY VISIT WITH YOU AFTER SURGERY IN YOUR PRIVATE ROOM DURING VISITING HOURS ONLY!  YOU NEED TO HAVE A COVID 19 TEST ON_11/8______ @__11 :05_____, THIS TEST MUST BE DONE BEFORE SURGERY,  COVID TESTING SITE [place] [id number], IT IS ON THE RIGHT GOING OUT [address] APPROXIMATELY  2 MINUTES PAST [employer] ON THE RIGHT. ONCE YOUR COVID TEST IS COMPLETED,  PLEASE BEGIN THE QUARANTINE INSTRUCTIONS AS OUTLINED IN YOUR HANDOUT.                [name]    Your procedure is scheduled on: 08/25/20   Report to [facility] Main  Entrance   Report to admitting at  7:25 AM     Call this number if you have problems the morning of surgery [place], [place].    No food after midnight.    You may have clear liquid until 7:00 AM.    At 7:00  AM drink pre surgery drink.   Nothing by mouth after 7:00 AM.   Take these medicines the morning of surgery with A SIP OF WATER: Gabapentin, Use inhalers and bring them with you                                 You may not have any metal on your body including hair pins and              piercings  Do not wear jewelry, make-up, lotions, powders or perfumes, deodorant             Do not wear nail polish on your fingernails.  Do not shave  48 hours prior to surgery.                Do not bring valuables to the hospital. [place].  Contacts, dentures or bridgework may not be worn into surgery.       Patients discharged the day of surgery will not be allowed to drive home.   IF YOU ARE HAVING SURGERY AND GOING HOME THE SAME DAY, YOU MUST HAVE AN ADULT TO DRIVE YOU HOME AND BE WITH YOU FOR 24 HOURS.   YOU MAY  GO HOME BY TAXI OR UBER OR ORTHERWISE, BUT AN ADULT MUST ACCOMPANY YOU HOME AND STAY WITH YOU FOR 24 HOURS.  Name and phone number of your driver:  Special Instructions: N/A              Please read over the following fact sheets you were given: _____________________________________________________________________  [facility]- Preparing for Total Shoulder Arthroplasty    Before surgery, you can play an important role. Because skin is not sterile, your skin needs to be as free of germs as possible. You can reduce the number of germs on your skin by using the following products. . Benzoyl Peroxide Gel o Reduces the number of germs present on the skin o Applied twice a day to shoulder area starting two days before surgery    ==================================================================  Please follow these instructions carefully:  BENZOYL PEROXIDE 5% GEL  Please do not use if you have an allergy to benzoyl peroxide.   If your skin becomes reddened/irritated stop using the benzoyl peroxide.  Starting two days before surgery, apply as follows: 1. Apply benzoyl peroxide in the morning and at night. Apply after taking a shower. If you are not taking a shower clean entire shoulder front, back, and side along with the armpit with a clean wet washcloth.  2. Place a quarter-sized dollop on your shoulder and rub in thoroughly, making sure to cover the front, back, and side of your shoulder, along with the armpit.   2 days before ____ AM   ____ PM              1 day before ____ AM   ____ PM                         3. Do this twice a day for two days.  (Last application is the night before surgery, AFTER using the CHG soap as described below).  4. Do NOT apply benzoyl peroxide gel on the day of surgery.            [facility] - Preparing for Surgery Before surgery, you can play an important role.  Because skin is not sterile, your skin needs to be as free of germs as possible.  You can  reduce the number of germs on your skin by washing with CHG (chlorahexidine gluconate) soap before surgery.  CHG is an antiseptic cleaner which kills germs and bonds with the skin to continue killing germs even after washing. Please DO NOT use if you have an allergy to CHG or antibacterial soaps.  If your skin becomes reddened/irritated stop using the CHG and inform your nurse when you arrive at Short Stay. Do not shave (including legs and underarms) for at least 48 hours prior to the first CHG shower.  You may shave your face/neck. Please follow these instructions carefully:  1.  Shower with CHG Soap the night before surgery and the  morning of Surgery.  2.  If you choose to wash your hair, wash your hair first as usual with your  normal  shampoo.  3.  After you shampoo, rinse your hair and body thoroughly to remove the  shampoo.                           4.  Use CHG as you would any other liquid soap.  You can apply chg directly  to the skin and wash                       Gently with a scrungie or clean washcloth.  5.  Apply the CHG Soap to your body ONLY FROM THE NECK DOWN.   Do not use on face/ open                           Wound or open sores. Avoid contact with eyes, ears mouth and genitals (private parts).                       Wash face,  Genitals (private parts) with your normal soap.             6.  Wash thoroughly, paying special  attention to the area where your surgery  will be performed.  7.  Thoroughly rinse your body with warm water from the neck down.  8.  DO NOT shower/wash with your normal soap after using and rinsing off  the CHG Soap.                9.  Pat yourself dry with a clean towel.            10.  Wear clean pajamas.            11.  Place clean sheets on your bed the night of your first shower and do not  sleep with pets. Day of Surgery : Do not apply any lotions/deodorants the morning of surgery.  Please wear clean clothes to the hospital/surgery center.  FAILURE TO  FOLLOW THESE INSTRUCTIONS MAY RESULT IN THE CANCELLATION OF YOUR SURGERY PATIENT SIGNATURE_________________________________  NURSE SIGNATURE__________________________________  ________________________________________________________________________   [name]  An incentive spirometer is a tool that can help keep your lungs clear and active. This tool measures how well you are filling your lungs with each breath. Taking long deep breaths may help reverse or decrease the chance of developing breathing (pulmonary) problems (especially infection) following:  A long period of time when you are unable to move or be active. BEFORE THE PROCEDURE   If the spirometer includes an indicator to show your best effort, your nurse or respiratory therapist will set it to a desired goal.  If possible, sit up straight or lean slightly forward. Try not to slouch.  Hold the incentive spirometer in an upright position. INSTRUCTIONS FOR USE  1. Sit on the edge of your bed if possible, or sit up as far as you can in bed or on a chair. 2. Hold the incentive spirometer in an upright position. 3. Breathe out normally. 4. Place the mouthpiece in your mouth and seal your lips tightly around it. 5. Breathe in slowly and as deeply as possible, raising the piston or the ball toward the top of the column. 6. Hold your breath for 3-5 seconds or for as long as possible. Allow the piston or ball to fall to the bottom of the column. 7. Remove the mouthpiece from your mouth and breathe out normally. 8. Rest for a few seconds and repeat Steps 1 through 7 at least 10 times every 1-2 hours when you are awake. Take your time and take a few normal breaths between deep breaths. 9. The spirometer may include an indicator to show your best effort. Use the indicator as a goal to work toward during each repetition. 10. After each set of 10 deep breaths, practice coughing to be sure your lungs are clear. If you have an  incision (the cut made at the time of surgery), support your incision when coughing by placing a pillow or rolled up towels firmly against it. Once you are able to get out of bed, walk around indoors and cough well. You may stop using the incentive spirometer when instructed by your caregiver.  RISKS AND COMPLICATIONS  Take your time so you do not get dizzy or light-headed.  If you are in pain, you may need to take or ask for pain medication before doing incentive spirometry. It is harder to take a deep breath if you are having pain. AFTER USE  Rest and breathe slowly and easily.  It can be helpful to keep track of a log of your progress. Your caregiver can provide  you with a simple table to help with this. If you are using the spirometer at home, follow these instructions: [place] IF:   You are having difficultly using the spirometer.  You have trouble using the spirometer as often as instructed.  Your pain medication is not giving enough relief while using the spirometer.  You develop fever of 100.5 F (38.1 C) or higher. SEEK IMMEDIATE MEDICAL CARE IF:   You cough up bloody sputum that had not been present before.  You develop fever of 102 F (38.9 C) or greater.  You develop worsening pain at or near the incision site. MAKE SURE YOU:   Understand these instructions.  Will watch your condition.  Will get help right away if you are not doing well or get worse. Document Released: 02/11/2007 Document Revised: 12/24/2011 Document Reviewed: 04/14/2007 [facility] Patient Information 2014 [place], [place].   ________________________________________________________________________

## 2020-08-17 NOTE — Progress Notes (Signed)
COVID Vaccine Completed:Yes Date COVID Vaccine completed:11/21/19 COVID vaccine manufacturer: Pfizer   PCP - Dr. Nyoka Cowden Cardiologist - no  Chest x-ray - no EKG - 07/15/20-Epic Stress Test - no ECHO - no Cardiac Cath - no Pacemaker/ICD device last checked:NA  Sleep Study - yes CPAP - no  Fasting Blood Sugar - NA Checks Blood Sugar _____ times a day  Blood Thinner Instructions:NA Aspirin Instructions: Last Dose:  Anesthesia review:   Patient denies shortness of breath, fever, cough and chest pain at PAT appointment yes   Patient verbalized understanding of instructions that were given to them at the PAT appointment. Patient was also instructed that they will need to review over the PAT instructions again at home before surgery. Yes  Pt moves around slowly because of back and hip pain . She doesn't do any housework but reports no SOB with ADLs and that her asthma isn't bad.

## 2020-08-22 ENCOUNTER — Other Ambulatory Visit (HOSPITAL_COMMUNITY): Payer: Medicare Other

## 2020-08-25 ENCOUNTER — Encounter (HOSPITAL_COMMUNITY): Admission: RE | Payer: Self-pay | Source: Home / Self Care

## 2020-08-25 ENCOUNTER — Ambulatory Visit (HOSPITAL_COMMUNITY): Admission: RE | Admit: 2020-08-25 | Payer: Medicare Other | Source: Home / Self Care | Admitting: Orthopedic Surgery

## 2020-08-25 LAB — TYPE AND SCREEN
ABO/RH(D): O POS
Antibody Screen: NEGATIVE

## 2020-08-25 SURGERY — ARTHROPLASTY, SHOULDER, TOTAL, REVERSE
Anesthesia: Choice | Site: Shoulder | Laterality: Right

## 2020-10-10 ENCOUNTER — Other Ambulatory Visit: Payer: Medicare Other

## 2020-11-08 ENCOUNTER — Ambulatory Visit (INDEPENDENT_AMBULATORY_CARE_PROVIDER_SITE_OTHER): Payer: Medicare Other | Admitting: Registered Nurse

## 2020-11-08 ENCOUNTER — Encounter: Payer: Self-pay | Admitting: Registered Nurse

## 2020-11-08 ENCOUNTER — Other Ambulatory Visit: Payer: Self-pay

## 2020-11-08 VITALS — BP 118/72 | HR 96 | Temp 98.0°F | Resp 18 | Ht 64.0 in | Wt 172.8 lb

## 2020-11-08 DIAGNOSIS — B370 Candidal stomatitis: Secondary | ICD-10-CM | POA: Diagnosis not present

## 2020-11-08 DIAGNOSIS — J22 Unspecified acute lower respiratory infection: Secondary | ICD-10-CM

## 2020-11-08 MED ORDER — AZITHROMYCIN 250 MG PO TABS: ORAL_TABLET | ORAL | 0 refills | Status: DC

## 2020-11-08 MED ORDER — NYSTATIN 100000 UNIT/ML MT SUSP: 5.0000 mL | Freq: Four times a day (QID) | OROMUCOSAL | 0 refills | Status: DC

## 2020-11-08 NOTE — Patient Instructions (Signed)
° ° ° °  If you have lab work done today you will be contacted with your lab results within the next 2 weeks.  If you have not heard from us then please contact us. The fastest way to get your results is to register for My Chart. ° ° °IF you received an x-ray today, you will receive an invoice from [facility]. Please contact [facility] at [phone] with questions or concerns regarding your invoice.  ° °IF you received labwork today, you will receive an invoice from [facility]. Please contact [facility] at [phone] with questions or concerns regarding your invoice.  ° °Our billing staff will not be able to assist you with questions regarding bills from these companies. ° °You will be contacted with the lab results as soon as they are available. The fastest way to get your results is to activate your My Chart account. Instructions are located on the last page of this paperwork. If you have not heard from us regarding the results in 2 weeks, please contact this office. °  ° ° ° °

## 2020-11-08 NOTE — Progress Notes (Signed)
Acute Office Visit  Subjective:    Patient ID: [name], female    DOB: 07-04-1946, 75 y.o.   MRN: [id number]  Chief Complaint  Patient presents with  . Thrush    Patient states she thinks she has some thrush on her tongue since last Friday. Per patient she used her inhaler but didn't wash her mouth out. She has used some oralgel and canker rub but nothing seems to help.     HPI Patient is in today for wheezing and thrush  Wheezing started last week. Feels like asthma exacerbation Took albuterol inhaler, helped Did not rinse mouth. Now whitish patches towards rear of tongue, mildly burning and uncomfortable.  Feeling like wheezing is ongoing with mild shortness of breath A mild cough with some productivity  Otherwise no concerns.   Past Medical History:  Diagnosis Date  . Arthritis   . Asthma   . 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 Complication of anesthesia   . GERD (gastroesophageal reflux disease)    at times  . Hyperlipidemia   . Hypertension   . Peripheral vascular disease ([place])    has spider veins  . Pneumonia   . Pneumothorax, traumatic    RIGHT; associated with multiple rib fractures after a fall  . PONV (postoperative nausea and vomiting)   . Pre-diabetes     Past Surgical History:  Procedure Laterality Date  . ABDOMINAL HYSTERECTOMY  1982  . AUGMENTATION MAMMAPLASTY Bilateral    Patient has bilateral implants   . 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 BREAST SURGERY    . ESOPHAGOGASTRODUODENOSCOPY (EGD) WITH PROPOFOL N/A 01/05/2020   Procedure: ESOPHAGOGASTRODUODENOSCOPY (EGD) WITH PROPOFOL;  Surgeon: Arta Silence, MD;  Location: [facility];  Service: Endoscopy;  Laterality: N/A;  . EUS N/A 01/05/2020   Procedure: UPPER ENDOSCOPIC ULTRASOUND (EUS) LINEAR;  Surgeon: Arta Silence, MD;  Location: [facility];  Service: Endoscopy;  Laterality: N/A;  . FINE NEEDLE ASPIRATION N/A 01/05/2020   Procedure: FINE NEEDLE ASPIRATION (FNA) LINEAR;   Surgeon: Arta Silence, MD;  Location: [facility];  Service: Endoscopy;  Laterality: N/A;  . JOINT REPLACEMENT  2009   right knee  . JOINT REPLACEMENT  2011   left knee  . MASTECTOMY  2008   right breast  . REDUCTION MAMMAPLASTY Left    Patient had lift with implant on Left side   . SHOULDER ARTHROSCOPY DISTAL CLAVICLE EXCISION AND OPEN ROTATOR CUFF REPAIR  2011  . TOTAL SHOULDER ARTHROPLASTY  03/14/2012   Procedure: TOTAL SHOULDER ARTHROPLASTY;  Surgeon: Alta Corning, MD;  Location: [place];  Service: Orthopedics;  Laterality: Left;    Family History  Problem Relation Age of Onset  . Breast cancer Sister   . Rheum arthritis Sister   . Breast cancer Paternal Grandmother   . Kidney Stones Mother   . CVA Mother   . Hypertension Father   . Anesthesia problems Neg Hx   . Hypotension Neg Hx   . Malignant hyperthermia Neg Hx   . Pseudochol deficiency Neg Hx     Social History   Socioeconomic History  . Marital status: Divorced    Spouse name: Not on file  . Number of children: 2  . Years of education: Not on file  . Highest education level: Not on file  Occupational History  . Occupation: Retired  . Occupation: Glass blower/designer    Comment: Arts development officer  Tobacco Use  . Smoking status:  Never Smoker  . Smokeless tobacco: Never Used  Vaping Use  . Vaping Use: Never used  Substance and Sexual Activity  . Alcohol use: No    Alcohol/week: 0.0 standard drinks  . Drug use: No  . Sexual activity: Not on file  Other Topics Concern  . Not on file  Social History Narrative  . Not on file   Social Determinants of Health   Financial Resource Strain: Not on file  Food Insecurity: Not on file  Transportation Needs: Not on file  Physical Activity: Not on file  Stress: Not on file  Social Connections: Not on file  Intimate Partner Violence: Not on file    Outpatient Medications Prior to Visit  Medication Sig Dispense Refill  . acetaminophen (TYLENOL) 325 MG  tablet Take 650 mg by mouth every 6 (six) hours as needed for mild pain or moderate pain.     Marland Kitchen albuterol (PROAIR HFA) 108 (90 Base) MCG/ACT inhaler Inhale 2 puffs into the lungs every 6 (six) hours as needed for wheezing or shortness of breath. 18 g 0  . atorvastatin (LIPITOR) 20 MG tablet Take 1 tablet (20 mg total) by mouth daily at 6 PM. 30 tablet 5  . Dextromethorphan-Guaifenesin 20-400 MG TABS Take 1 tablet by mouth at bedtime.     . diphenhydrAMINE (SOMINEX) 25 MG tablet Take 50 mg by mouth at bedtime.    . fenofibrate (TRICOR) 145 MG tablet Take 1 tablet (145 mg total) by mouth daily. 90 tablet 1  . fluticasone-salmeterol (ADVAIR HFA) 115-21 MCG/ACT inhaler Inhale 2 puffs into the lungs 2 (two) times daily. 12 g 5  . gabapentin (NEURONTIN) 300 MG capsule 1 po BID prn. (Patient taking differently: Take 300 mg by mouth 3 (three) times daily.) 60 capsule 1  . ibuprofen (ADVIL,MOTRIN) 100 MG tablet Take 100 mg by mouth every 6 (six) hours as needed for pain.     . Multiple Vitamin (MULTIVITAMIN) capsule Take 1 capsule by mouth daily.     Marland Kitchen olmesartan-hydrochlorothiazide (BENICAR HCT) 20-12.5 MG tablet Take 1 tablet by mouth daily. 30 tablet 5  . oxymetazoline (AFRIN) 0.05 % nasal spray Place 1 spray into both nostrils at bedtime.    . potassium chloride SA (KLOR-CON) 20 MEQ tablet Take 1 tablet (20 mEq total) by mouth 2 (two) times daily. 10 tablet 0  . Respiratory Therapy Supplies (FLUTTER) DEVI 1 each by Does not apply route 2 (two) times daily. 1 each 0  . promethazine (PHENERGAN) 25 MG tablet Take 1 tablet (25 mg total) by mouth every 6 (six) hours as needed for nausea or vomiting. (Patient not taking: Reported on 11/08/2020) 30 tablet 0   No facility-administered medications prior to visit.    Allergies  Allergen Reactions  . Dilaudid [Hydromorphone Hcl] Itching  . Morphine And Related Nausea And Vomiting    Review of Systems Per hpi      Objective:    Physical Exam Vitals and  nursing note reviewed.  Constitutional:      General: She is not in acute distress.    Appearance: Normal appearance. She is not ill-appearing, toxic-appearing or diaphoretic.  HENT:     Mouth/Throat:     Mouth: Mucous membranes are moist.     Pharynx: Oropharyngeal exudate (oral thrush) present. No posterior oropharyngeal erythema.  Cardiovascular:     Rate and Rhythm: Normal rate and regular rhythm.     Pulses: Normal pulses.     Heart sounds: Normal heart sounds. No  murmur heard. No friction rub. No gallop.   Pulmonary:     Effort: Pulmonary effort is normal. No respiratory distress.     Breath sounds: No stridor. Wheezing present. No rhonchi or rales.  Chest:     Chest wall: No tenderness.  Skin:    General: Skin is warm and dry.     Capillary Refill: Capillary refill takes less than 2 seconds.  Neurological:     General: No focal deficit present.     Mental Status: She is alert and oriented to person, place, and time. Mental status is at baseline.  Psychiatric:        Mood and Affect: Mood normal.        Behavior: Behavior normal.        Thought Content: Thought content normal.        Judgment: Judgment normal.     BP 118/72   Pulse 96   Temp 98 F (36.7 C) (Temporal)   Resp 18   Ht 5\' 4"  (1.626 m)   Wt 172 lb 12.8 oz (78.4 kg)   SpO2 94%   BMI 29.66 kg/m  Wt Readings from Last 3 Encounters:  11/08/20 172 lb 12.8 oz (78.4 kg)  08/17/20 169 lb (76.7 kg)  08/13/20 171 lb 15.3 oz (78 kg)    There are no preventive care reminders to display for this patient.  There are no preventive care reminders to display for this patient.   No results found for: TSH Lab Results  Component Value Date   WBC 7.7 08/13/2020   HGB 13.3 08/13/2020   HCT 38.7 08/13/2020   MCV 94.4 08/13/2020   PLT 351 08/13/2020   Lab Results  Component Value Date   NA 138 08/13/2020   K 3.5 08/13/2020   CO2 25 08/13/2020   GLUCOSE 127 (H) 08/13/2020   BUN 18 08/13/2020   CREATININE  0.69 08/13/2020   BILITOT 0.7 08/13/2020   ALKPHOS 71 08/13/2020   AST 16 08/13/2020   ALT 16 08/13/2020   PROT 7.6 08/13/2020   ALBUMIN 4.7 08/13/2020   CALCIUM 10.0 08/13/2020   ANIONGAP 13 08/13/2020   Lab Results  Component Value Date   CHOL 182 08/12/2020   Lab Results  Component Value Date   HDL 65 08/12/2020   Lab Results  Component Value Date   LDLCALC 93 08/12/2020   Lab Results  Component Value Date   TRIG 141 08/12/2020   Lab Results  Component Value Date   CHOLHDL 2.8 08/12/2020   Lab Results  Component Value Date   HGBA1C 5.4 08/12/2020       Assessment & Plan:   Problem List Items Addressed This Visit   None   Visit Diagnoses    Oral thrush    -  Primary   Relevant Medications   azithromycin (ZITHROMAX) 250 MG tablet   nystatin (MYCOSTATIN) 100000 UNIT/ML suspension   Lower respiratory infection       Relevant Medications   azithromycin (ZITHROMAX) 250 MG tablet   nystatin (MYCOSTATIN) 100000 UNIT/ML suspension       Meds ordered this encounter  Medications  . azithromycin (ZITHROMAX) 250 MG tablet    Sig: Take 2 tabs on first day. Then take 1 tab daily. Finish entire supply.    Dispense:  6 tablet    Refill:  0    Order Specific Question:   Supervising Provider    Answer:   Carlota Raspberry, JEFFREY R [[id number]]  . nystatin (MYCOSTATIN)  100000 UNIT/ML suspension    Sig: Take 5 mLs (500,000 Units total) by mouth 4 (four) times daily.    Dispense:  60 mL    Refill:  0    Order Specific Question:   Supervising Provider    Answer:   Carlota Raspberry, JEFFREY R [[id number]]   PLAN  Concern for lower respiratory infection  Will give zpack and continue using albuterol as needed  Nystatin for apparent thrush  Patient encouraged to call clinic with any questions, comments, or concerns.   Maximiano Coss, NP

## 2020-11-14 ENCOUNTER — Other Ambulatory Visit: Payer: Medicare Other

## 2020-11-24 ENCOUNTER — Other Ambulatory Visit: Payer: Medicare Other

## 2020-12-13 ENCOUNTER — Ambulatory Visit
Admission: RE | Admit: 2020-12-13 | Discharge: 2020-12-13 | Disposition: A | Discharge status: Home / Self Care | Payer: Medicare Other | Source: Ambulatory Visit | Attending: Gastroenterology | Admitting: Gastroenterology

## 2020-12-13 DIAGNOSIS — D490 Neoplasm of unspecified behavior of digestive system: Secondary | ICD-10-CM

## 2020-12-13 MED ORDER — GADOBENATE DIMEGLUMINE 529 MG/ML IV SOLN
16.0000 mL | Freq: Once | INTRAVENOUS | Status: AC | PRN
  Administered 2020-12-13: 16 mL via INTRAVENOUS

## 2021-01-03 ENCOUNTER — Emergency Department (HOSPITAL_BASED_OUTPATIENT_CLINIC_OR_DEPARTMENT_OTHER)
Admission: EM | Admit: 2021-01-03 | Discharge: 2021-01-03 | Disposition: A | Discharge status: Home / Self Care | Payer: Medicare Other | Attending: Emergency Medicine | Admitting: Emergency Medicine

## 2021-01-03 ENCOUNTER — Emergency Department (HOSPITAL_BASED_OUTPATIENT_CLINIC_OR_DEPARTMENT_OTHER): Payer: Medicare Other

## 2021-01-03 ENCOUNTER — Encounter (HOSPITAL_BASED_OUTPATIENT_CLINIC_OR_DEPARTMENT_OTHER): Payer: Self-pay | Admitting: *Deleted

## 2021-01-03 ENCOUNTER — Other Ambulatory Visit: Payer: Self-pay

## 2021-01-03 DIAGNOSIS — Z96653 Presence of artificial knee joint, bilateral: Secondary | ICD-10-CM | POA: Diagnosis not present

## 2021-01-03 DIAGNOSIS — R1110 Vomiting, unspecified: Secondary | ICD-10-CM

## 2021-01-03 DIAGNOSIS — Z8507 Personal history of malignant neoplasm of pancreas: Secondary | ICD-10-CM | POA: Insufficient documentation

## 2021-01-03 DIAGNOSIS — I10 Essential (primary) hypertension: Secondary | ICD-10-CM | POA: Insufficient documentation

## 2021-01-03 DIAGNOSIS — J45909 Unspecified asthma, uncomplicated: Secondary | ICD-10-CM | POA: Diagnosis not present

## 2021-01-03 DIAGNOSIS — R112 Nausea with vomiting, unspecified: Secondary | ICD-10-CM | POA: Insufficient documentation

## 2021-01-03 DIAGNOSIS — R197 Diarrhea, unspecified: Secondary | ICD-10-CM | POA: Insufficient documentation

## 2021-01-03 DIAGNOSIS — Z79899 Other long term (current) drug therapy: Secondary | ICD-10-CM | POA: Insufficient documentation

## 2021-01-03 DIAGNOSIS — M419 Scoliosis, unspecified: Secondary | ICD-10-CM | POA: Diagnosis not present

## 2021-01-03 DIAGNOSIS — Z96612 Presence of left artificial shoulder joint: Secondary | ICD-10-CM | POA: Diagnosis not present

## 2021-01-03 LAB — CBC
HCT: 43.3 % (ref 36.0–46.0)
Hemoglobin: 15 g/dL (ref 12.0–15.0)
MCH: 33.3 pg (ref 26.0–34.0)
MCHC: 34.6 g/dL (ref 30.0–36.0)
MCV: 96 fL (ref 80.0–100.0)
Platelets: 338 10*3/uL (ref 150–400)
RBC: 4.51 MIL/uL (ref 3.87–5.11)
RDW: 12.7 % (ref 11.5–15.5)
WBC: 8.2 10*3/uL (ref 4.0–10.5)
nRBC: 0 % (ref 0.0–0.2)

## 2021-01-03 LAB — COMPREHENSIVE METABOLIC PANEL
ALT: 18 U/L (ref 0–44)
AST: 21 U/L (ref 15–41)
Albumin: 4.9 g/dL (ref 3.5–5.0)
Alkaline Phosphatase: 86 U/L (ref 38–126)
Anion gap: 13 (ref 5–15)
BUN: 18 mg/dL (ref 8–23)
CO2: 25 mmol/L (ref 22–32)
Calcium: 9.9 mg/dL (ref 8.9–10.3)
Chloride: 96 mmol/L — ABNORMAL LOW (ref 98–111)
Creatinine, Ser: 0.78 mg/dL (ref 0.44–1.00)
GFR, Estimated: >60 mL/min (ref >60)
Glucose, Bld: 117 mg/dL — ABNORMAL HIGH (ref 70–99)
Potassium: 3.2 mmol/L — ABNORMAL LOW (ref 3.5–5.1)
Sodium: 134 mmol/L — ABNORMAL LOW (ref 135–145)
Total Bilirubin: 0.7 mg/dL (ref 0.3–1.2)
Total Protein: 8.2 g/dL — ABNORMAL HIGH (ref 6.5–8.1)

## 2021-01-03 LAB — URINALYSIS, ROUTINE W REFLEX MICROSCOPIC
Glucose, UA: NEGATIVE mg/dL
Hgb urine dipstick: NEGATIVE
Ketones, ur: NEGATIVE mg/dL
Leukocytes,Ua: NEGATIVE
Nitrite: NEGATIVE
Protein, ur: NEGATIVE mg/dL
Specific Gravity, Urine: 1.02 (ref 1.005–1.030)
pH: 6 (ref 5.0–8.0)

## 2021-01-03 LAB — LIPASE, BLOOD: Lipase: 30 U/L (ref 11–51)

## 2021-01-03 MED ORDER — SODIUM CHLORIDE 0.9 % IV BOLUS
500.0000 mL | Freq: Once | INTRAVENOUS | Status: AC
  Administered 2021-01-03: 500 mL via INTRAVENOUS

## 2021-01-03 MED ORDER — POTASSIUM CHLORIDE CRYS ER 20 MEQ PO TBCR
40.0000 meq | EXTENDED_RELEASE_TABLET | Freq: Once | ORAL | Status: AC
  Administered 2021-01-03: 40 meq via ORAL
  Filled 2021-01-03: qty 2

## 2021-01-03 MED ORDER — POTASSIUM CHLORIDE ER 10 MEQ PO TBCR: 20.0000 meq | EXTENDED_RELEASE_TABLET | Freq: Every day | ORAL | 0 refills | Status: DC

## 2021-01-03 MED ORDER — ONDANSETRON 4 MG PO TBDP: 4.0000 mg | ORAL_TABLET | Freq: Three times a day (TID) | ORAL | 0 refills | Status: DC | PRN

## 2021-01-03 MED ORDER — ONDANSETRON HCL 4 MG/2ML IJ SOLN
4.0000 mg | Freq: Once | INTRAMUSCULAR | Status: AC
  Administered 2021-01-03: 4 mg via INTRAVENOUS
  Filled 2021-01-03: qty 2

## 2021-01-03 MED ORDER — IOHEXOL 300 MG/ML  SOLN
100.0000 mL | Freq: Once | INTRAMUSCULAR | Status: AC | PRN
  Administered 2021-01-03: 100 mL via INTRAVENOUS

## 2021-01-03 NOTE — ED Provider Notes (Addendum)
[facility] Provider Note   CSN: [id number] Arrival date & time: 01/03/21  1848     History Chief Complaint  Patient presents with  . Emesis    [name] is a 75 y.o. female history of pancreatic mass, GERD, hypertension, hyperlipidemia, PVD, prediabetes.  Of note patient reports that she has not been diagnosed with pancreatic cancer and is not on chemotherapy.  Patient reports nausea and nonbloody/nonbilious emesis onset last night has been constant.  Denies similar symptoms in the past no clear aggravating or alleviating factors.  Denies fever/chills, chest pain/shortness of breath, diaphoresis, abdominal pain, diarrhea, dysuria/hematuria or any additional concerns.  HPI     Past Medical History:  Diagnosis Date  . Arthritis   . Asthma   . Cancer ([place])    Pancreas  . Complication of anesthesia   . GERD (gastroesophageal reflux disease)    at times  . Hyperlipidemia   . Hypertension   . Peripheral vascular disease ([place])    has spider veins  . Pneumonia   . Pneumothorax, traumatic    RIGHT; associated with multiple rib fractures after a fall  . PONV (postoperative nausea and vomiting)   . Pre-diabetes     Patient Active Problem List   Diagnosis Date Noted  . Prediabetes 02/14/2020  . Essential hypertension 08/19/2017  . Hyperlipidemia 08/19/2017  . Cough variant asthma 04/04/2016  . Upper airway cough syndrome 04/04/2016  . 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 ABDOMINAL HYSTERECTOMY  1982  . AUGMENTATION MAMMAPLASTY Bilateral    Patient has bilateral implants   . 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 BREAST SURGERY    . ESOPHAGOGASTRODUODENOSCOPY (EGD) WITH PROPOFOL N/A 01/05/2020   Procedure: ESOPHAGOGASTRODUODENOSCOPY (EGD) WITH PROPOFOL;  Surgeon: Arta Silence, MD;  Location: [facility];  Service: Endoscopy;  Laterality: N/A;  . EUS N/A 01/05/2020    Procedure: UPPER ENDOSCOPIC ULTRASOUND (EUS) LINEAR;  Surgeon: Arta Silence, MD;  Location: [facility];  Service: Endoscopy;  Laterality: N/A;  . FINE NEEDLE ASPIRATION N/A 01/05/2020   Procedure: FINE NEEDLE ASPIRATION (FNA) LINEAR;  Surgeon: Arta Silence, MD;  Location: [facility];  Service: Endoscopy;  Laterality: N/A;  . JOINT REPLACEMENT  2009   right knee  . JOINT REPLACEMENT  2011   left knee  . MASTECTOMY  2008   right breast  . REDUCTION MAMMAPLASTY Left    Patient had lift with implant on Left side   . SHOULDER ARTHROSCOPY DISTAL CLAVICLE EXCISION AND OPEN ROTATOR CUFF REPAIR  2011  . TOTAL SHOULDER ARTHROPLASTY  03/14/2012   Procedure: TOTAL SHOULDER ARTHROPLASTY;  Surgeon: Alta Corning, MD;  Location: [facility];  Service: Orthopedics;  Laterality: Left;     OB History   No obstetric history on file.     Family History  Problem Relation Age of Onset  . Breast cancer Sister   . Rheum arthritis Sister   . Breast cancer Paternal Grandmother   . Kidney Stones Mother   . CVA Mother   . Hypertension Father   . Anesthesia problems Neg Hx   . Hypotension Neg Hx   . Malignant hyperthermia Neg Hx   . Pseudochol deficiency Neg Hx     Social History   Tobacco Use  . Smoking status: Never Smoker  . Smokeless tobacco: Never Used  Vaping Use  . Vaping Use: Never used  Substance Use Topics  .  Alcohol use: No    Alcohol/week: 0.0 standard drinks  . Drug use: No    Home Medications Prior to Admission medications   Medication Sig Start Date End Date Taking? Authorizing Provider  ondansetron (ZOFRAN ODT) 4 MG disintegrating tablet Take 1 tablet (4 mg total) by mouth every 8 (eight) hours as needed for nausea or vomiting. 01/03/21  Yes Nuala Alpha A, PA-C  potassium chloride (KLOR-CON) 10 MEQ tablet Take 2 tablets (20 mEq total) by mouth daily for 2 days. 01/03/21 01/05/21 Yes Nuala Alpha A, PA-C  acetaminophen (TYLENOL) 325 MG tablet Take 650 mg by mouth every  6 (six) hours as needed for mild pain or moderate pain.     [provider]  albuterol (PROAIR HFA) 108 (90 Base) MCG/ACT inhaler Inhale 2 puffs into the lungs every 6 (six) hours as needed for wheezing or shortness of breath. 08/12/20   [name], MD  atorvastatin (LIPITOR) 20 MG tablet Take 1 tablet (20 mg total) by mouth daily at 6 PM. 08/12/20   [name], MD  azithromycin (ZITHROMAX) 250 MG tablet Take 2 tabs on first day. Then take 1 tab daily. Finish entire supply. 11/08/20   Maximiano Coss, NP  Dextromethorphan-Guaifenesin 20-400 MG TABS Take 1 tablet by mouth at bedtime.     [provider]  diphenhydrAMINE (SOMINEX) 25 MG tablet Take 50 mg by mouth at bedtime.    [provider]  fenofibrate (TRICOR) 145 MG tablet Take 1 tablet (145 mg total) by mouth daily. 08/12/20   [name], MD  fluticasone-salmeterol (ADVAIR HFA) [phone] MCG/ACT inhaler Inhale 2 puffs into the lungs 2 (two) times daily. 08/12/20   [name], MD  gabapentin (NEURONTIN) 300 MG capsule 1 po BID prn. Patient taking differently: Take 300 mg by mouth 3 (three) times daily. 07/01/19   [name], MD  ibuprofen (ADVIL,MOTRIN) 100 MG tablet Take 100 mg by mouth every 6 (six) hours as needed for pain.     [provider]  Multiple Vitamin (MULTIVITAMIN) capsule Take 1 capsule by mouth daily.     [provider]  nystatin (MYCOSTATIN) 100000 UNIT/ML suspension Take 5 mLs (500,000 Units total) by mouth 4 (four) times daily. 11/08/20   Maximiano Coss, NP  olmesartan-hydrochlorothiazide (BENICAR HCT) 20-12.5 MG tablet Take 1 tablet by mouth daily. 08/12/20   [name], MD  oxymetazoline (AFRIN) 0.05 % nasal spray Place 1 spray into both nostrils at bedtime.    [provider]  promethazine (PHENERGAN) 25 MG tablet Take 1 tablet (25 mg total) by mouth every 6 (six) hours as needed for nausea or vomiting. Patient not taking: Reported on  4/81/8563 14/97/02   Delora Fuel, MD  Respiratory Therapy Supplies (FLUTTER) [place] 1 each by Does not apply route 2 (two) times daily. 07/15/19   Julian Hy, DO  potassium chloride SA (KLOR-CON) 20 MEQ tablet Take 1 tablet (20 mEq total) by mouth 2 (two) times daily. 63/78/58 8/50/27  Delora Fuel, MD    Allergies    Dilaudid [hydromorphone hcl] and Morphine and related  Review of Systems   Review of Systems Ten systems are reviewed and are negative for acute change except as noted in the HPI  Physical Exam Updated Vital Signs BP (!) 145/84   Pulse 79   Temp 98.5 F (36.9 C) (Oral)   Resp 13   Ht 5\' 4"  (1.626 m)   Wt 77.2 kg   SpO2 98%  BMI 29.21 kg/m   Physical Exam Constitutional:      General: She is not in acute distress.    Appearance: Normal appearance. She is well-developed. She is not ill-appearing or diaphoretic.  HENT:     Head: Normocephalic and atraumatic.  Eyes:     General: Vision grossly intact. Gaze aligned appropriately.     Pupils: Pupils are equal, round, and reactive to light.  Neck:     Trachea: Trachea and phonation normal.  Pulmonary:     Effort: Pulmonary effort is normal. No respiratory distress.  Abdominal:     General: There is no distension.     Palpations: Abdomen is soft.     Tenderness: There is no abdominal tenderness. There is no guarding or rebound.  Musculoskeletal:        General: Normal range of motion.     Cervical back: Normal range of motion.  Skin:    General: Skin is warm and dry.  Neurological:     Mental Status: She is alert.     GCS: GCS eye subscore is 4. GCS verbal subscore is 5. GCS motor subscore is 6.     Comments: Speech is clear and goal oriented, follows commands Major Cranial nerves without deficit, no facial droop Moves extremities without ataxia, coordination intact  Psychiatric:        Behavior: Behavior normal.     ED Results / Procedures / Treatments   Labs (all labs ordered are listed, but only  abnormal results are displayed) Labs Reviewed  COMPREHENSIVE METABOLIC PANEL - Abnormal; Notable for the following components:      Result Value   Sodium 134 (*)    Potassium 3.2 (*)    Chloride 96 (*)    Glucose, Bld 117 (*)    Total Protein 8.2 (*)    All other components within normal limits  URINALYSIS, ROUTINE W REFLEX MICROSCOPIC - Abnormal; Notable for the following components:   Bilirubin Urine SMALL (*)    All other components within normal limits  LIPASE, BLOOD  CBC    EKG EKG Interpretation  Date/Time:  Tuesday January 03 2021 19:50:15 EDT Ventricular Rate:  89 PR Interval:    QRS Duration: 87 QT Interval:  377 QTC Calculation: 459 R Axis:   66 Text Interpretation: Sinus rhythm Probable left atrial enlargement No significant change since last tracing Confirmed by Wandra Arthurs [phone]) on 01/03/2021 8:06:26 PM   Radiology CT ABDOMEN PELVIS W CONTRAST  Result Date: 01/03/2021 CLINICAL DATA:  75 year old female with concern for bowel obstruction. EXAM: CT ABDOMEN AND PELVIS WITH CONTRAST TECHNIQUE: Multidetector CT imaging of the abdomen and pelvis was performed using the standard protocol following bolus administration of intravenous contrast. CONTRAST:  146mL OMNIPAQUE IOHEXOL 300 MG/ML  SOLN COMPARISON:  CT abdomen pelvis dated 08/13/2020. FINDINGS: Lower chest: Similar appearance of a small right lung base subpleural nodule, likely scarring, stable since 10/07/2019. The visualized lung bases are otherwise clear. No intra-abdominal free air or free fluid. Hepatobiliary: The liver is unremarkable. No intrahepatic biliary ductal dilatation. The gallbladder is unremarkable. Pancreas: No active inflammation. No gland atrophy or ductal dilatation. Subcentimeter hypodense lesion from the anterior proximal body of the pancreas, similar to the studies dating back to 10/07/2019, likely a benign or indolent process such as a small side branch IPMN. Attention on follow-up imaging  recommended. Spleen: Normal in size without focal abnormality. Adrenals/Urinary Tract: The adrenal glands unremarkable. There is no hydronephrosis on either side. There is symmetric  enhancement and excretion of contrast by both kidneys. The visualized ureters and urinary bladder appear unremarkable. Stomach/Bowel: There is no bowel obstruction or active inflammation. The appendix is normal. Vascular/Lymphatic: Mild aortoiliac atherosclerotic disease. The IVC is unremarkable. No portal venous gas. There is no adenopathy. Reproductive: Hysterectomy.  No adnexal masses. Other: Partially visualized right breast implant. Musculoskeletal: Degenerative changes of the spine and scoliosis. No acute osseous pathology. IMPRESSION: 1. No acute intra-abdominal or pelvic pathology. No bowel obstruction. Normal appendix. 2. Aortic Atherosclerosis (ICD10-I70.0). Electronically Signed   By: Anner Crete M.D.   On: 01/03/2021 22:38    Procedures Procedures   Medications Ordered in ED Medications  potassium chloride SA (KLOR-CON) CR tablet 40 mEq (has no administration in time range)  sodium chloride 0.9 % bolus 500 mL (500 mLs Intravenous New Bag/Given 01/03/21 2005)  ondansetron (ZOFRAN) injection 4 mg (4 mg Intravenous Given 01/03/21 2001)  sodium chloride 0.9 % bolus 500 mL (500 mLs Intravenous New Bag/Given 01/03/21 2057)  iohexol (OMNIPAQUE) 300 MG/ML solution 100 mL (100 mLs Intravenous Contrast Given 01/03/21 2159)    ED Course  I have reviewed the triage vital signs and the nursing notes.  Pertinent labs & imaging results that were available during my care of the patient were reviewed by me and considered in my medical decision making (see chart for details).    MDM Rules/Calculators/A&P                         Additional history obtained from: 1. Nursing notes from this visit. 2. Review of electronic medical records. ------------------------- 75 year old female presents with painless nausea and  vomiting onset last night.  Abdomen soft nontender on examination.  Denies any chest pain shortness of breath or diaphoresis.  Will obtain lab work and CT abdomen pelvis.  IV fluids and antiemetics ordered.  EKG: Sinus rhythm Probable left atrial enlargement No significant change since last tracing Confirmed by Wandra Arthurs [phone]) on 01/03/2021 8:06:26 PM ------------------------ I ordered, reviewed and interpreted labs which include: CBC shows mild hyponatremia and hypochloremia as well as hypokalemia of 3.2.  No AKI, LFT elevations or gap. Lipase in the normal limits, doubt pancreatitis. Urinalysis is bilirubin otherwise within normal limits, no evidence for UTI. CBC within normal limits, no leukocytosis to suggest infectious process, no anemia or thrombocytopenia.  CTAP:  IMPRESSION:  1. No acute intra-abdominal or pelvic pathology. No bowel  obstruction. Normal appendix.  2. Aortic Atherosclerosis (ICD10-I70.0).  - Patient seen and evaluated by Dr. Darl Householder, plan of care is discharged with Zofran and outpatient gastroenterology and PCP follow-up. Suspect gastroenteritis.  No evidence for infection, no indication for antibiotics at this time.  Patient is agreeable to care plan with no questions or concerns.  Serum chloride repletion given in the ER.  Will give K-Dur and encourage patient have potassium rechecked by PCP.  Patient follows with Dr. Paulita Fujita at [facility] gastroenterology I have encouraged her to call their office tomorrow morning to schedule follow-up appointment.  At this time there does not appear to be any evidence of an acute emergency medical condition and the patient appears stable for discharge with appropriate outpatient follow up. Diagnosis was discussed with patient who verbalizes understanding of care plan and is agreeable to discharge. I have discussed return precautions with patient who verbalizes understanding. Patient encouraged to follow-up with their PCP and GI. All questions  answered.  Note: Portions of this report may have been transcribed using voice  recognition software. Every effort was made to ensure accuracy; however, inadvertent computerized transcription errors may still be present. Final Clinical Impression(s) / ED Diagnoses Final diagnoses:  Non-intractable vomiting, presence of nausea not specified, unspecified vomiting type    Rx / DC Orders ED Discharge Orders         Ordered    ondansetron (ZOFRAN ODT) 4 MG disintegrating tablet  Every 8 hours PRN        01/03/21 2320    potassium chloride (KLOR-CON) 10 MEQ tablet  Daily        01/03/21 2323           Gari Crown 01/03/21 2324    Deliah Boston, PA-C 01/03/21 2326    Drenda Freeze, MD 01/06/21 [phone]

## 2021-01-03 NOTE — ED Triage Notes (Signed)
Vomiting since last night. No pain. No diarrhea.

## 2021-01-03 NOTE — Discharge Instructions (Addendum)
At this time there does not appear to be the presence of an emergent medical condition, however there is always the potential for conditions to change. Please read and follow the below instructions.  Please return to the Emergency Department immediately for any new or worsening symptoms. Please be sure to follow up with your Primary Care Provider within one week regarding your visit today; please call their office to schedule an appointment even if you are feeling better for a follow-up visit. Please call Dr. Paulita Fujita tomorrow morning to inform them of your ER visit and to schedule a follow-up appointment. Your CT scan today showed a right lung nodule, subcentimeter lesion of your pancreas, atherosclerosis, degenerative changes of your spine.  Please discuss these findings with your primary care provider and your gastroenterologist at your follow-up appointments. Your potassium level was slightly low today, please eat foods high in potassium and please have your potassium level and other electrolytes rechecked by your primary care provider and your gastroenterologist at your follow-up appointment.  Go to the nearest Emergency Department immediately if: You have fever or chills You have pain in your chest, neck, arm, or jaw. You feel very weak or you pass out (faint). You throw up again and again. You have throw up that is bright red or looks like black coffee grounds. You have bloody or black poop (stools) or poop that looks like tar. You have a very bad headache, a stiff neck, or both. You have very bad pain, cramping, or bloating in your belly (abdomen). You have trouble breathing. You are breathing very quickly. Your heart is beating very quickly. Your skin feels cold and clammy. You feel confused. You have signs of losing too much water in your body, such as: Dark pee, very little pee, or no pee. Cracked lips. Dry mouth. Sunken eyes. Sleepiness. Weakness. You have any new/concerning or  worsening of symptoms   Please read the additional information packets attached to your discharge summary.  Do not take your medicine if  develop an itchy rash, swelling in your mouth or lips, or difficulty breathing; call 911 and seek immediate emergency medical attention if this occurs.  You may review your lab tests and imaging results in their entirety on your MyChart account.  Please discuss all results of fully with your primary care provider and other specialist at your follow-up visit.  Note: Portions of this text may have been transcribed using voice recognition software. Every effort was made to ensure accuracy; however, inadvertent computerized transcription errors may still be present.

## 2021-01-06 ENCOUNTER — Other Ambulatory Visit: Payer: Self-pay

## 2021-01-06 ENCOUNTER — Encounter (HOSPITAL_BASED_OUTPATIENT_CLINIC_OR_DEPARTMENT_OTHER): Payer: Self-pay

## 2021-01-06 ENCOUNTER — Emergency Department (HOSPITAL_BASED_OUTPATIENT_CLINIC_OR_DEPARTMENT_OTHER)
Admission: EM | Admit: 2021-01-06 | Discharge: 2021-01-06 | Disposition: A | Discharge status: Home / Self Care | Payer: Medicare Other | Attending: Emergency Medicine | Admitting: Emergency Medicine

## 2021-01-06 DIAGNOSIS — Z7951 Long term (current) use of inhaled steroids: Secondary | ICD-10-CM | POA: Diagnosis not present

## 2021-01-06 DIAGNOSIS — Z79899 Other long term (current) drug therapy: Secondary | ICD-10-CM | POA: Diagnosis not present

## 2021-01-06 DIAGNOSIS — I10 Essential (primary) hypertension: Secondary | ICD-10-CM | POA: Diagnosis not present

## 2021-01-06 DIAGNOSIS — Z96652 Presence of left artificial knee joint: Secondary | ICD-10-CM | POA: Insufficient documentation

## 2021-01-06 DIAGNOSIS — Z96612 Presence of left artificial shoulder joint: Secondary | ICD-10-CM | POA: Insufficient documentation

## 2021-01-06 DIAGNOSIS — J45909 Unspecified asthma, uncomplicated: Secondary | ICD-10-CM | POA: Diagnosis not present

## 2021-01-06 DIAGNOSIS — Z8507 Personal history of malignant neoplasm of pancreas: Secondary | ICD-10-CM | POA: Diagnosis not present

## 2021-01-06 DIAGNOSIS — R112 Nausea with vomiting, unspecified: Secondary | ICD-10-CM | POA: Diagnosis not present

## 2021-01-06 LAB — CBC WITH DIFFERENTIAL/PLATELET
Abs Immature Granulocytes: 0.02 10*3/uL (ref 0.00–0.07)
Basophils Absolute: 0 10*3/uL (ref 0.0–0.1)
Basophils Relative: 1 %
Eosinophils Absolute: 0.1 10*3/uL (ref 0.0–0.5)
Eosinophils Relative: 2 %
HCT: 41.8 % (ref 36.0–46.0)
Hemoglobin: 14.3 g/dL (ref 12.0–15.0)
Immature Granulocytes: 0 %
Lymphocytes Relative: 28 %
Lymphs Abs: 1.9 10*3/uL (ref 0.7–4.0)
MCH: 32.7 pg (ref 26.0–34.0)
MCHC: 34.2 g/dL (ref 30.0–36.0)
MCV: 95.7 fL (ref 80.0–100.0)
Monocytes Absolute: 0.7 10*3/uL (ref 0.1–1.0)
Monocytes Relative: 10 %
Neutro Abs: 4.2 10*3/uL (ref 1.7–7.7)
Neutrophils Relative %: 59 %
Platelets: 333 10*3/uL (ref 150–400)
RBC: 4.37 MIL/uL (ref 3.87–5.11)
RDW: 12.6 % (ref 11.5–15.5)
WBC: 6.9 10*3/uL (ref 4.0–10.5)
nRBC: 0 % (ref 0.0–0.2)

## 2021-01-06 LAB — COMPREHENSIVE METABOLIC PANEL
ALT: 16 U/L (ref 0–44)
AST: 15 U/L (ref 15–41)
Albumin: 4.5 g/dL (ref 3.5–5.0)
Alkaline Phosphatase: 82 U/L (ref 38–126)
Anion gap: 13 (ref 5–15)
BUN: 24 mg/dL — ABNORMAL HIGH (ref 8–23)
CO2: 25 mmol/L (ref 22–32)
Calcium: 9.6 mg/dL (ref 8.9–10.3)
Chloride: 96 mmol/L — ABNORMAL LOW (ref 98–111)
Creatinine, Ser: 0.72 mg/dL (ref 0.44–1.00)
GFR, Estimated: >60 mL/min (ref >60)
Glucose, Bld: 135 mg/dL — ABNORMAL HIGH (ref 70–99)
Potassium: 3.1 mmol/L — ABNORMAL LOW (ref 3.5–5.1)
Sodium: 134 mmol/L — ABNORMAL LOW (ref 135–145)
Total Bilirubin: 0.8 mg/dL (ref 0.3–1.2)
Total Protein: 7.8 g/dL (ref 6.5–8.1)

## 2021-01-06 MED ORDER — PROMETHAZINE HCL 25 MG PO TABS: 25.0000 mg | ORAL_TABLET | Freq: Four times a day (QID) | ORAL | 0 refills | Status: DC | PRN

## 2021-01-06 MED ORDER — SODIUM CHLORIDE 0.9 % IV BOLUS
1000.0000 mL | Freq: Once | INTRAVENOUS | Status: AC
  Administered 2021-01-06: 1000 mL via INTRAVENOUS

## 2021-01-06 MED ORDER — SODIUM CHLORIDE 0.9 % IV SOLN
12.5000 mg | Freq: Once | INTRAVENOUS | Status: AC
  Administered 2021-01-06: 12.5 mg via INTRAVENOUS
  Filled 2021-01-06: qty 0.5

## 2021-01-06 MED ORDER — PROMETHAZINE HCL 25 MG/ML IJ SOLN
INTRAMUSCULAR | Status: AC
  Filled 2021-01-06: qty 1

## 2021-01-06 MED ORDER — PROMETHAZINE HCL 25 MG RE SUPP: 25.0000 mg | Freq: Four times a day (QID) | RECTAL | 0 refills | Status: DC | PRN

## 2021-01-06 MED ORDER — POTASSIUM CHLORIDE CRYS ER 20 MEQ PO TBCR
40.0000 meq | EXTENDED_RELEASE_TABLET | Freq: Once | ORAL | Status: AC
  Administered 2021-01-06: 40 meq via ORAL
  Filled 2021-01-06: qty 2

## 2021-01-06 NOTE — ED Provider Notes (Signed)
[place] EMERGENCY DEPARTMENT Provider Note   CSN: [id number] Arrival date & time: 01/06/21  1321     History Chief Complaint  Patient presents with  . Emesis    [name] is a 75 y.o. female.  75 year old female with history of hypertension, hyperlipidemia, prediabetes return to the emergency room with ongoing nausea and vomiting without abdominal pain.  Patient was seen emergency room for same 2 days ago, was discharged home with Zofran which she has been taking without relief.  Patient states that Zofran does not typically work for her but states Phenergan does.  Patient does not have abdominal pain, denies fevers, chills, changes in bowel or bladder habits.  No known sick contacts.  No other complaints or concerns.        Past Medical History:  Diagnosis Date  . Arthritis   . Asthma   . 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 Complication of anesthesia   . GERD (gastroesophageal reflux disease)    at times  . Hyperlipidemia   . Hypertension   . Peripheral vascular disease ([place])    has spider veins  . Pneumonia   . Pneumothorax, traumatic    RIGHT; associated with multiple rib fractures after a fall  . PONV (postoperative nausea and vomiting)   . Pre-diabetes     Patient Active Problem List   Diagnosis Date Noted  . Prediabetes 02/14/2020  . Essential hypertension 08/19/2017  . Hyperlipidemia 08/19/2017  . Cough variant asthma 04/04/2016  . Upper airway cough syndrome 04/04/2016  . 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 ABDOMINAL HYSTERECTOMY  1982  . AUGMENTATION MAMMAPLASTY Bilateral    Patient has bilateral implants   . 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 BREAST SURGERY    . ESOPHAGOGASTRODUODENOSCOPY (EGD) WITH PROPOFOL N/A 01/05/2020   Procedure: ESOPHAGOGASTRODUODENOSCOPY (EGD) WITH PROPOFOL;  Surgeon: Arta Silence, MD;  Location: [facility];  Service: Endoscopy;   Laterality: N/A;  . EUS N/A 01/05/2020   Procedure: UPPER ENDOSCOPIC ULTRASOUND (EUS) LINEAR;  Surgeon: Arta Silence, MD;  Location: [facility];  Service: Endoscopy;  Laterality: N/A;  . FINE NEEDLE ASPIRATION N/A 01/05/2020   Procedure: FINE NEEDLE ASPIRATION (FNA) LINEAR;  Surgeon: Arta Silence, MD;  Location: [facility];  Service: Endoscopy;  Laterality: N/A;  . JOINT REPLACEMENT  2009   right knee  . JOINT REPLACEMENT  2011   left knee  . MASTECTOMY  2008   right breast  . REDUCTION MAMMAPLASTY Left    Patient had lift with implant on Left side   . SHOULDER ARTHROSCOPY DISTAL CLAVICLE EXCISION AND OPEN ROTATOR CUFF REPAIR  2011  . TOTAL SHOULDER ARTHROPLASTY  03/14/2012   Procedure: TOTAL SHOULDER ARTHROPLASTY;  Surgeon: Alta Corning, MD;  Location: [place];  Service: Orthopedics;  Laterality: Left;     OB History   No obstetric history on file.     Family History  Problem Relation Age of Onset  . Breast cancer Sister   . Rheum arthritis Sister   . Breast cancer Paternal Grandmother   . Kidney Stones Mother   . CVA Mother   . Hypertension Father   . Anesthesia problems Neg Hx   . Hypotension Neg Hx   . Malignant hyperthermia Neg Hx   . Pseudochol deficiency Neg Hx     Social History   Tobacco Use  . Smoking status: Never Smoker  .  Smokeless tobacco: Never Used  Vaping Use  . Vaping Use: Never used  Substance Use Topics  . Alcohol use: No    Alcohol/week: 0.0 standard drinks  . Drug use: No    Home Medications Prior to Admission medications   Medication Sig Start Date End Date Taking? Authorizing Provider  promethazine (PHENERGAN) 25 MG suppository Place 1 suppository (25 mg total) rectally every 6 (six) hours as needed for nausea or vomiting. 01/06/21  Yes Tacy Learn, PA-C  promethazine (PHENERGAN) 25 MG tablet Take 1 tablet (25 mg total) by mouth every 6 (six) hours as needed for nausea or vomiting. 01/06/21  Yes Tacy Learn, PA-C   acetaminophen (TYLENOL) 325 MG tablet Take 650 mg by mouth every 6 (six) hours as needed for mild pain or moderate pain.     [provider]  albuterol (PROAIR HFA) 108 (90 Base) MCG/ACT inhaler Inhale 2 puffs into the lungs every 6 (six) hours as needed for wheezing or shortness of breath. 08/12/20   [name], MD  atorvastatin (LIPITOR) 20 MG tablet Take 1 tablet (20 mg total) by mouth daily at 6 PM. 08/12/20   [name], MD  azithromycin (ZITHROMAX) 250 MG tablet Take 2 tabs on first day. Then take 1 tab daily. Finish entire supply. 11/08/20   Maximiano Coss, NP  Dextromethorphan-Guaifenesin 20-400 MG TABS Take 1 tablet by mouth at bedtime.     [provider]  diphenhydrAMINE (SOMINEX) 25 MG tablet Take 50 mg by mouth at bedtime.    [provider]  fenofibrate (TRICOR) 145 MG tablet Take 1 tablet (145 mg total) by mouth daily. 08/12/20   [name], MD  fluticasone-salmeterol (ADVAIR HFA) [phone] MCG/ACT inhaler Inhale 2 puffs into the lungs 2 (two) times daily. 08/12/20   [name], MD  gabapentin (NEURONTIN) 300 MG capsule 1 po BID prn. Patient taking differently: Take 300 mg by mouth 3 (three) times daily. 07/01/19   [name], MD  ibuprofen (ADVIL,MOTRIN) 100 MG tablet Take 100 mg by mouth every 6 (six) hours as needed for pain.     [provider]  Multiple Vitamin (MULTIVITAMIN) capsule Take 1 capsule by mouth daily.     [provider]  nystatin (MYCOSTATIN) 100000 UNIT/ML suspension Take 5 mLs (500,000 Units total) by mouth 4 (four) times daily. 11/08/20   Maximiano Coss, NP  olmesartan-hydrochlorothiazide (BENICAR HCT) 20-12.5 MG tablet Take 1 tablet by mouth daily. 08/12/20   [name], MD  ondansetron (ZOFRAN ODT) 4 MG disintegrating tablet Take 1 tablet (4 mg total) by mouth every 8 (eight) hours as needed for nausea or vomiting. 01/03/21   Nuala Alpha A, PA-C  oxymetazoline (AFRIN) 0.05 %  nasal spray Place 1 spray into both nostrils at bedtime.    [provider]  potassium chloride (KLOR-CON) 10 MEQ tablet Take 2 tablets (20 mEq total) by mouth daily for 2 days. 01/03/21 01/05/21  Deliah Boston, PA-C  Respiratory Therapy Supplies (FLUTTER) DEVI 1 each by Does not apply route 2 (two) times daily. 07/15/19   Julian Hy, DO  potassium chloride SA (KLOR-CON) 20 MEQ tablet Take 1 tablet (20 mEq total) by mouth 2 (two) times daily. 83/15/17 03/30/06  Delora Fuel, MD    Allergies    Dilaudid [hydromorphone hcl] and Morphine and related  Review of Systems   Review of Systems  Constitutional: Negative for chills, diaphoresis and fever.  Respiratory: Negative for shortness of  breath.   Cardiovascular: Negative for chest pain.  Gastrointestinal: Positive for nausea and vomiting. Negative for abdominal pain, constipation and diarrhea.  Genitourinary: Negative for decreased urine volume, difficulty urinating and dysuria.  Musculoskeletal: Negative for arthralgias and myalgias.  Skin: Negative for rash and wound.  Allergic/Immunologic: Negative for immunocompromised state.  Neurological: Negative for weakness.  Psychiatric/Behavioral: Negative for confusion.  All other systems reviewed and are negative.   Physical Exam Updated Vital Signs BP (!) 149/83   Pulse 79   Temp 98.3 F (36.8 C) (Oral)   Resp 18   Ht 5\' 4"  (1.626 m)   Wt 77.1 kg   SpO2 95%   BMI 29.18 kg/m   Physical Exam Vitals and nursing note reviewed.  Constitutional:      General: She is not in acute distress.    Appearance: She is well-developed. She is not diaphoretic.  HENT:     Head: Normocephalic and atraumatic.  Cardiovascular:     Rate and Rhythm: Normal rate and regular rhythm.     Pulses: Normal pulses.     Heart sounds: Normal heart sounds.  Pulmonary:     Effort: Pulmonary effort is normal.     Breath sounds: Normal breath sounds.  Abdominal:     Palpations: Abdomen is  soft.     Tenderness: There is no abdominal tenderness. There is no right CVA tenderness or left CVA tenderness.  Musculoskeletal:     Cervical back: Neck supple.     Right lower leg: No edema.     Left lower leg: No edema.  Skin:    General: Skin is warm and dry.     Findings: No erythema or rash.  Neurological:     Mental Status: She is alert and oriented to person, place, and time.     Gait: Gait normal.  Psychiatric:        Behavior: Behavior normal.     ED Results / Procedures / Treatments   Labs (all labs ordered are listed, but only abnormal results are displayed) Labs Reviewed  COMPREHENSIVE METABOLIC PANEL - Abnormal; Notable for the following components:      Result Value   Sodium 134 (*)    Potassium 3.1 (*)    Chloride 96 (*)    Glucose, Bld 135 (*)    BUN 24 (*)    All other components within normal limits  CBC WITH DIFFERENTIAL/PLATELET    EKG None  Radiology No results found.  Procedures Procedures   Medications Ordered in ED Medications  promethazine (PHENERGAN) 25 MG/ML injection (  Not Given 01/06/21 1530)  sodium chloride 0.9 % bolus 1,000 mL (0 mLs Intravenous Stopped 01/06/21 1615)  promethazine (PHENERGAN) 12.5 mg in sodium chloride 0.9 % 50 mL IVPB (0 mg Intravenous Stopped 01/06/21 1456)  potassium chloride SA (KLOR-CON) CR tablet 40 mEq (40 mEq Oral Given 01/06/21 1625)    ED Course  I have reviewed the triage vital signs and the nursing notes.  Pertinent labs & imaging results that were available during my care of the patient were reviewed by me and considered in my medical decision making (see chart for details).  Clinical Course as of 01/06/21 1644  Fri Jan 06, 5766  6425 75 year old female returns with ongoing nausea and vomiting not relieved with Zofran at home.  On exam, patient appears to feel unwell, her abdomen is soft and nontender.  Patient was given IV fluids and Phenergan, she states that she is feeling much better.  Labs  reviewed, CBC is unremarkable, CMP with mild hypokalemia with potassium of 3.1.  Patient will be given oral potassium and p.o. challenge, if she can tolerate oral fluids, she will be discharged home with prescription for Phenergan however she is advised that if she is not able to tolerate liquids at home she will need to return to the emergency room for likely admission for intractable nausea and vomiting. [LM]    Clinical Course User Index [LM] Roque Lias   MDM Rules/Calculators/A&P                          Final Clinical Impression(s) / ED Diagnoses Final diagnoses:  Non-intractable vomiting with nausea, unspecified vomiting type    Rx / DC Orders ED Discharge Orders         Ordered    promethazine (PHENERGAN) 25 MG suppository  Every 6 hours PRN        01/06/21 1615    promethazine (PHENERGAN) 25 MG tablet  Every 6 hours PRN        01/06/21 1615           Tacy Learn, PA-C 01/06/21 1644    Sherwood Gambler, MD 01/07/21 1720

## 2021-01-06 NOTE — ED Triage Notes (Signed)
Pt arrives with c/o continued NV since last being seen here 2 days ago states that she was sent home with Zofran but it is not working.

## 2021-01-06 NOTE — Discharge Instructions (Addendum)
Follow-up with your primary care provider for recheck.  Return to emergency room for worsening or concerning symptoms. Phenergan suppositories as well as Phenergan tablets sent to your pharmacy today.  Use 1 or the other, suppositories are helpful when you are unable to tolerate tablets.

## 2021-01-09 ENCOUNTER — Other Ambulatory Visit: Payer: Self-pay | Admitting: Family Medicine

## 2021-01-09 DIAGNOSIS — Z1231 Encounter for screening mammogram for malignant neoplasm of breast: Secondary | ICD-10-CM

## 2021-01-26 ENCOUNTER — Encounter (HOSPITAL_COMMUNITY): Payer: Self-pay

## 2021-01-26 ENCOUNTER — Emergency Department (HOSPITAL_COMMUNITY)
Admission: EM | Admit: 2021-01-26 | Discharge: 2021-01-26 | Discharge status: Against Medical Advice | Payer: Medicare Other | Source: Home / Self Care

## 2021-01-26 ENCOUNTER — Emergency Department (HOSPITAL_COMMUNITY): Payer: Medicare Other

## 2021-01-26 DIAGNOSIS — Z90710 Acquired absence of both cervix and uterus: Secondary | ICD-10-CM | POA: Diagnosis not present

## 2021-01-26 DIAGNOSIS — Z8261 Family history of arthritis: Secondary | ICD-10-CM | POA: Diagnosis not present

## 2021-01-26 DIAGNOSIS — Z5321 Procedure and treatment not carried out due to patient leaving prior to being seen by health care provider: Secondary | ICD-10-CM | POA: Insufficient documentation

## 2021-01-26 DIAGNOSIS — Z20822 Contact with and (suspected) exposure to covid-19: Secondary | ICD-10-CM | POA: Diagnosis not present

## 2021-01-26 DIAGNOSIS — R531 Weakness: Secondary | ICD-10-CM | POA: Insufficient documentation

## 2021-01-26 DIAGNOSIS — E041 Nontoxic single thyroid nodule: Secondary | ICD-10-CM | POA: Diagnosis not present

## 2021-01-26 DIAGNOSIS — M4186 Other forms of scoliosis, lumbar region: Secondary | ICD-10-CM | POA: Diagnosis not present

## 2021-01-26 DIAGNOSIS — Z86012 Personal history of benign carcinoid tumor: Secondary | ICD-10-CM | POA: Diagnosis not present

## 2021-01-26 DIAGNOSIS — E785 Hyperlipidemia, unspecified: Secondary | ICD-10-CM | POA: Diagnosis not present

## 2021-01-26 DIAGNOSIS — S2231XA Fracture of one rib, right side, initial encounter for closed fracture: Secondary | ICD-10-CM | POA: Diagnosis not present

## 2021-01-26 DIAGNOSIS — I2510 Atherosclerotic heart disease of native coronary artery without angina pectoris: Secondary | ICD-10-CM | POA: Diagnosis not present

## 2021-01-26 DIAGNOSIS — I10 Essential (primary) hypertension: Secondary | ICD-10-CM | POA: Diagnosis present

## 2021-01-26 DIAGNOSIS — R112 Nausea with vomiting, unspecified: Secondary | ICD-10-CM | POA: Diagnosis not present

## 2021-01-26 DIAGNOSIS — C7A8 Other malignant neuroendocrine tumors: Secondary | ICD-10-CM | POA: Diagnosis not present

## 2021-01-26 DIAGNOSIS — R059 Cough, unspecified: Secondary | ICD-10-CM | POA: Diagnosis not present

## 2021-01-26 DIAGNOSIS — K8689 Other specified diseases of pancreas: Secondary | ICD-10-CM | POA: Diagnosis not present

## 2021-01-26 DIAGNOSIS — Z885 Allergy status to narcotic agent status: Secondary | ICD-10-CM | POA: Diagnosis not present

## 2021-01-26 DIAGNOSIS — Z8249 Family history of ischemic heart disease and other diseases of the circulatory system: Secondary | ICD-10-CM | POA: Diagnosis not present

## 2021-01-26 DIAGNOSIS — E876 Hypokalemia: Secondary | ICD-10-CM | POA: Diagnosis not present

## 2021-01-26 DIAGNOSIS — R52 Pain, unspecified: Secondary | ICD-10-CM | POA: Insufficient documentation

## 2021-01-26 DIAGNOSIS — Z96653 Presence of artificial knee joint, bilateral: Secondary | ICD-10-CM | POA: Diagnosis present

## 2021-01-26 DIAGNOSIS — Z9012 Acquired absence of left breast and nipple: Secondary | ICD-10-CM | POA: Diagnosis not present

## 2021-01-26 DIAGNOSIS — J189 Pneumonia, unspecified organism: Secondary | ICD-10-CM | POA: Diagnosis not present

## 2021-01-26 DIAGNOSIS — Z96612 Presence of left artificial shoulder joint: Secondary | ICD-10-CM | POA: Diagnosis present

## 2021-01-26 DIAGNOSIS — Z79899 Other long term (current) drug therapy: Secondary | ICD-10-CM | POA: Diagnosis not present

## 2021-01-26 DIAGNOSIS — E042 Nontoxic multinodular goiter: Secondary | ICD-10-CM | POA: Diagnosis present

## 2021-01-26 DIAGNOSIS — Z803 Family history of malignant neoplasm of breast: Secondary | ICD-10-CM | POA: Diagnosis not present

## 2021-01-26 DIAGNOSIS — Z823 Family history of stroke: Secondary | ICD-10-CM | POA: Diagnosis not present

## 2021-01-26 DIAGNOSIS — S2241XA Multiple fractures of ribs, right side, initial encounter for closed fracture: Secondary | ICD-10-CM | POA: Diagnosis not present

## 2021-01-26 LAB — COMPREHENSIVE METABOLIC PANEL
ALT: 14 U/L (ref 0–44)
AST: 16 U/L (ref 15–41)
Albumin: 4 g/dL (ref 3.5–5.0)
Alkaline Phosphatase: 92 U/L (ref 38–126)
Anion gap: 12 (ref 5–15)
BUN: 16 mg/dL (ref 8–23)
CO2: 21 mmol/L — ABNORMAL LOW (ref 22–32)
Calcium: 9.5 mg/dL (ref 8.9–10.3)
Chloride: 103 mmol/L (ref 98–111)
Creatinine, Ser: 0.87 mg/dL (ref 0.44–1.00)
GFR, Estimated: >60 mL/min (ref >60)
Glucose, Bld: 134 mg/dL — ABNORMAL HIGH (ref 70–99)
Potassium: 3 mmol/L — ABNORMAL LOW (ref 3.5–5.1)
Sodium: 136 mmol/L (ref 135–145)
Total Bilirubin: 0.5 mg/dL (ref 0.3–1.2)
Total Protein: 7.2 g/dL (ref 6.5–8.1)

## 2021-01-26 LAB — CBC WITH DIFFERENTIAL/PLATELET
Abs Immature Granulocytes: 0.04 10*3/uL (ref 0.00–0.07)
Basophils Absolute: 0.1 10*3/uL (ref 0.0–0.1)
Basophils Relative: 1 %
Eosinophils Absolute: 0.1 10*3/uL (ref 0.0–0.5)
Eosinophils Relative: 1 %
HCT: 41.4 % (ref 36.0–46.0)
Hemoglobin: 14.1 g/dL (ref 12.0–15.0)
Immature Granulocytes: 0 %
Lymphocytes Relative: 19 %
Lymphs Abs: 2.1 10*3/uL (ref 0.7–4.0)
MCH: 33.4 pg (ref 26.0–34.0)
MCHC: 34.1 g/dL (ref 30.0–36.0)
MCV: 98.1 fL (ref 80.0–100.0)
Monocytes Absolute: 0.7 10*3/uL (ref 0.1–1.0)
Monocytes Relative: 6 %
Neutro Abs: 8 10*3/uL — ABNORMAL HIGH (ref 1.7–7.7)
Neutrophils Relative %: 73 %
Platelets: 428 10*3/uL — ABNORMAL HIGH (ref 150–400)
RBC: 4.22 MIL/uL (ref 3.87–5.11)
RDW: 12 % (ref 11.5–15.5)
WBC: 11.1 10*3/uL — ABNORMAL HIGH (ref 4.0–10.5)
nRBC: 0 % (ref 0.0–0.2)

## 2021-01-26 NOTE — ED Notes (Signed)
Called pt for room, x3. No answer.

## 2021-01-26 NOTE — ED Triage Notes (Signed)
Emergency Medicine Provider Triage Evaluation Note  [name] , a 75 y.o. female  was evaluated in triage.  Pt complains of weakness, coughing, aching, phlegm Vaccinated and boosted  Review of Systems  Positive: As above Negative: CP, SOB, nausea, vomiting, fevers  Physical Exam  BP 122/79 (BP Location: Left Arm)   Pulse (!) 121   Temp 99.6 F (37.6 C) (Oral)   Resp 16   SpO2 93%  Gen:   Awake, no distress   HEENT:  Atraumatic  Resp:  Normal effort  Cardiac:  Normal rate  Abd:   Nondistended, nontender  MSK:   Moves extremities without difficulty  Neuro:  Speech clear   Medical Decision Making  Medically screening exam initiated at 2:28 PM.  Appropriate orders placed.  [name] was informed that the remainder of the evaluation will be completed by another provider, this initial triage assessment does not replace that evaluation, and the importance of remaining in the ED until their evaluation is complete.  Clinical Impression  75 year old female presenting with URI type symptoms, with some shortness of breath.  Well-appearing on arrival.  Will order labs, chest x-ray, EKG, swab for Covid and flu.  Stable for further evaluation.   Garald Balding, PA-C 01/26/21 1429

## 2021-01-26 NOTE — ED Notes (Signed)
Called pt X3 no answer. Unable to update vs

## 2021-01-27 ENCOUNTER — Encounter (HOSPITAL_BASED_OUTPATIENT_CLINIC_OR_DEPARTMENT_OTHER): Payer: Self-pay | Admitting: *Deleted

## 2021-01-27 ENCOUNTER — Observation Stay (HOSPITAL_COMMUNITY): Payer: Medicare Other

## 2021-01-27 ENCOUNTER — Emergency Department (HOSPITAL_BASED_OUTPATIENT_CLINIC_OR_DEPARTMENT_OTHER): Payer: Medicare Other

## 2021-01-27 ENCOUNTER — Inpatient Hospital Stay (HOSPITAL_BASED_OUTPATIENT_CLINIC_OR_DEPARTMENT_OTHER)
Admission: EM | Admit: 2021-01-27 | Discharge: 2021-01-30 | DRG: 391 | Disposition: A | Discharge status: Home / Self Care | Payer: Medicare Other | Attending: Family Medicine | Admitting: Family Medicine

## 2021-01-27 ENCOUNTER — Other Ambulatory Visit: Payer: Self-pay

## 2021-01-27 DIAGNOSIS — Z96612 Presence of left artificial shoulder joint: Secondary | ICD-10-CM | POA: Diagnosis present

## 2021-01-27 DIAGNOSIS — Z803 Family history of malignant neoplasm of breast: Secondary | ICD-10-CM

## 2021-01-27 DIAGNOSIS — Z90710 Acquired absence of both cervix and uterus: Secondary | ICD-10-CM

## 2021-01-27 DIAGNOSIS — E876 Hypokalemia: Secondary | ICD-10-CM | POA: Diagnosis present

## 2021-01-27 DIAGNOSIS — Z20822 Contact with and (suspected) exposure to covid-19: Secondary | ICD-10-CM | POA: Diagnosis present

## 2021-01-27 DIAGNOSIS — I2510 Atherosclerotic heart disease of native coronary artery without angina pectoris: Secondary | ICD-10-CM | POA: Diagnosis not present

## 2021-01-27 DIAGNOSIS — E041 Nontoxic single thyroid nodule: Secondary | ICD-10-CM

## 2021-01-27 DIAGNOSIS — Z823 Family history of stroke: Secondary | ICD-10-CM | POA: Diagnosis not present

## 2021-01-27 DIAGNOSIS — Z8249 Family history of ischemic heart disease and other diseases of the circulatory system: Secondary | ICD-10-CM | POA: Diagnosis not present

## 2021-01-27 DIAGNOSIS — R112 Nausea with vomiting, unspecified: Principal | ICD-10-CM | POA: Diagnosis present

## 2021-01-27 DIAGNOSIS — Z8261 Family history of arthritis: Secondary | ICD-10-CM | POA: Diagnosis not present

## 2021-01-27 DIAGNOSIS — R059 Cough, unspecified: Secondary | ICD-10-CM | POA: Diagnosis not present

## 2021-01-27 DIAGNOSIS — M4186 Other forms of scoliosis, lumbar region: Secondary | ICD-10-CM | POA: Diagnosis not present

## 2021-01-27 DIAGNOSIS — Z79899 Other long term (current) drug therapy: Secondary | ICD-10-CM | POA: Diagnosis not present

## 2021-01-27 DIAGNOSIS — I10 Essential (primary) hypertension: Secondary | ICD-10-CM | POA: Diagnosis not present

## 2021-01-27 DIAGNOSIS — E042 Nontoxic multinodular goiter: Secondary | ICD-10-CM | POA: Diagnosis not present

## 2021-01-27 DIAGNOSIS — Z96653 Presence of artificial knee joint, bilateral: Secondary | ICD-10-CM | POA: Diagnosis present

## 2021-01-27 DIAGNOSIS — Z9012 Acquired absence of left breast and nipple: Secondary | ICD-10-CM

## 2021-01-27 DIAGNOSIS — C7A8 Other malignant neuroendocrine tumors: Secondary | ICD-10-CM | POA: Diagnosis present

## 2021-01-27 DIAGNOSIS — Z885 Allergy status to narcotic agent status: Secondary | ICD-10-CM | POA: Diagnosis not present

## 2021-01-27 DIAGNOSIS — J189 Pneumonia, unspecified organism: Secondary | ICD-10-CM

## 2021-01-27 DIAGNOSIS — S2231XA Fracture of one rib, right side, initial encounter for closed fracture: Secondary | ICD-10-CM | POA: Diagnosis not present

## 2021-01-27 DIAGNOSIS — Z86012 Personal history of benign carcinoid tumor: Secondary | ICD-10-CM | POA: Diagnosis not present

## 2021-01-27 DIAGNOSIS — E785 Hyperlipidemia, unspecified: Secondary | ICD-10-CM | POA: Diagnosis present

## 2021-01-27 DIAGNOSIS — S2241XA Multiple fractures of ribs, right side, initial encounter for closed fracture: Secondary | ICD-10-CM | POA: Diagnosis not present

## 2021-01-27 DIAGNOSIS — K8689 Other specified diseases of pancreas: Secondary | ICD-10-CM | POA: Diagnosis not present

## 2021-01-27 DIAGNOSIS — R110 Nausea: Secondary | ICD-10-CM

## 2021-01-27 LAB — CBC WITH DIFFERENTIAL/PLATELET
Abs Immature Granulocytes: 0.03 10*3/uL (ref 0.00–0.07)
Basophils Absolute: 0.1 10*3/uL (ref 0.0–0.1)
Basophils Relative: 1 %
Eosinophils Absolute: 0.1 10*3/uL (ref 0.0–0.5)
Eosinophils Relative: 1 %
HCT: 40.1 % (ref 36.0–46.0)
Hemoglobin: 14 g/dL (ref 12.0–15.0)
Immature Granulocytes: 0 %
Lymphocytes Relative: 32 %
Lymphs Abs: 3.1 10*3/uL (ref 0.7–4.0)
MCH: 32.9 pg (ref 26.0–34.0)
MCHC: 34.9 g/dL (ref 30.0–36.0)
MCV: 94.4 fL (ref 80.0–100.0)
Monocytes Absolute: 0.9 10*3/uL (ref 0.1–1.0)
Monocytes Relative: 9 %
Neutro Abs: 5.7 10*3/uL (ref 1.7–7.7)
Neutrophils Relative %: 57 %
Platelets: 429 10*3/uL — ABNORMAL HIGH (ref 150–400)
RBC: 4.25 MIL/uL (ref 3.87–5.11)
RDW: 12 % (ref 11.5–15.5)
WBC: 9.8 10*3/uL (ref 4.0–10.5)
nRBC: 0 % (ref 0.0–0.2)

## 2021-01-27 LAB — COMPREHENSIVE METABOLIC PANEL
ALT: 16 U/L (ref 0–44)
AST: 20 U/L (ref 15–41)
Albumin: 4.2 g/dL (ref 3.5–5.0)
Alkaline Phosphatase: 94 U/L (ref 38–126)
Anion gap: 15 (ref 5–15)
BUN: 22 mg/dL (ref 8–23)
CO2: 21 mmol/L — ABNORMAL LOW (ref 22–32)
Calcium: 9.6 mg/dL (ref 8.9–10.3)
Chloride: 102 mmol/L (ref 98–111)
Creatinine, Ser: 0.73 mg/dL (ref 0.44–1.00)
GFR, Estimated: >60 mL/min (ref >60)
Glucose, Bld: 149 mg/dL — ABNORMAL HIGH (ref 70–99)
Potassium: 3 mmol/L — ABNORMAL LOW (ref 3.5–5.1)
Sodium: 138 mmol/L (ref 135–145)
Total Bilirubin: 0.7 mg/dL (ref 0.3–1.2)
Total Protein: 7.7 g/dL (ref 6.5–8.1)

## 2021-01-27 LAB — RESP PANEL BY RT-PCR (FLU A&B, COVID) ARPGX2
Influenza A by PCR: NEGATIVE
Influenza B by PCR: NEGATIVE
SARS Coronavirus 2 by RT PCR: NEGATIVE

## 2021-01-27 LAB — PROTIME-INR
INR: 1 (ref 0.8–1.2)
Prothrombin Time: 12.8 seconds (ref 11.4–15.2)

## 2021-01-27 LAB — CK: Total CK: 293 U/L — ABNORMAL HIGH (ref 38–234)

## 2021-01-27 LAB — LACTIC ACID, PLASMA: Lactic Acid, Venous: 1.5 mmol/L (ref 0.5–1.9)

## 2021-01-27 LAB — TROPONIN I (HIGH SENSITIVITY)
Troponin I (High Sensitivity): 12 ng/L (ref <18)
Troponin I (High Sensitivity): 13 ng/L (ref <18)

## 2021-01-27 LAB — LIPASE, BLOOD: Lipase: 25 U/L (ref 11–51)

## 2021-01-27 LAB — MAGNESIUM: Magnesium: 1.9 mg/dL (ref 1.7–2.4)

## 2021-01-27 LAB — PROCALCITONIN: Procalcitonin: 18.67 ng/mL

## 2021-01-27 LAB — TSH: TSH: 0.208 u[IU]/mL — ABNORMAL LOW (ref 0.350–4.500)

## 2021-01-27 LAB — PHOSPHORUS: Phosphorus: 2.8 mg/dL (ref 2.5–4.6)

## 2021-01-27 LAB — BRAIN NATRIURETIC PEPTIDE: B Natriuretic Peptide: 37.3 pg/mL (ref 0.0–100.0)

## 2021-01-27 LAB — T4, FREE: Free T4: 1.41 ng/dL — ABNORMAL HIGH (ref 0.61–1.12)

## 2021-01-27 MED ORDER — IOHEXOL 300 MG/ML  SOLN
100.0000 mL | Freq: Once | INTRAMUSCULAR | Status: AC | PRN
  Administered 2021-01-27: 100 mL via INTRAVENOUS

## 2021-01-27 MED ORDER — PANTOPRAZOLE SODIUM 40 MG IV SOLR
40.0000 mg | Freq: Once | INTRAVENOUS | Status: AC
  Administered 2021-01-27: 40 mg via INTRAVENOUS
  Filled 2021-01-27: qty 40

## 2021-01-27 MED ORDER — FENOFIBRATE 160 MG PO TABS
160.0000 mg | ORAL_TABLET | Freq: Every day | ORAL | Status: DC
  Administered 2021-01-28 – 2021-01-30 (×3): 160 mg via ORAL
  Filled 2021-01-27 (×3): qty 1

## 2021-01-27 MED ORDER — ALBUTEROL SULFATE (2.5 MG/3ML) 0.083% IN NEBU
2.5000 mg | INHALATION_SOLUTION | Freq: Two times a day (BID) | RESPIRATORY_TRACT | Status: DC
  Administered 2021-01-28 – 2021-01-30 (×5): 2.5 mg via RESPIRATORY_TRACT
  Filled 2021-01-27 (×5): qty 3

## 2021-01-27 MED ORDER — POTASSIUM CHLORIDE CRYS ER 20 MEQ PO TBCR
40.0000 meq | EXTENDED_RELEASE_TABLET | Freq: Once | ORAL | Status: DC
  Filled 2021-01-27: qty 2

## 2021-01-27 MED ORDER — SODIUM CHLORIDE 0.9 % IV SOLN
1.0000 g | Freq: Once | INTRAVENOUS | Status: AC
  Administered 2021-01-27: 1 g via INTRAVENOUS
  Filled 2021-01-27: qty 10

## 2021-01-27 MED ORDER — PROCHLORPERAZINE EDISYLATE 10 MG/2ML IJ SOLN
10.0000 mg | Freq: Four times a day (QID) | INTRAMUSCULAR | Status: DC | PRN
  Administered 2021-01-27 – 2021-01-29 (×4): 10 mg via INTRAVENOUS
  Filled 2021-01-27 (×4): qty 2

## 2021-01-27 MED ORDER — SODIUM CHLORIDE 0.9 % IV SOLN: INTRAVENOUS | Status: DC

## 2021-01-27 MED ORDER — LACTATED RINGERS IV BOLUS
1000.0000 mL | Freq: Once | INTRAVENOUS | Status: AC
  Administered 2021-01-27: 1000 mL via INTRAVENOUS

## 2021-01-27 MED ORDER — ONDANSETRON HCL 4 MG/2ML IJ SOLN
4.0000 mg | Freq: Once | INTRAMUSCULAR | Status: AC
  Administered 2021-01-27: 4 mg via INTRAVENOUS
  Filled 2021-01-27: qty 2

## 2021-01-27 MED ORDER — IPRATROPIUM-ALBUTEROL 0.5-2.5 (3) MG/3ML IN SOLN
3.0000 mL | Freq: Once | RESPIRATORY_TRACT | Status: AC
  Administered 2021-01-27: 3 mL via RESPIRATORY_TRACT
  Filled 2021-01-27: qty 3

## 2021-01-27 MED ORDER — AZITHROMYCIN 500 MG IV SOLR
500.0000 mg | INTRAVENOUS | Status: DC
  Administered 2021-01-28 – 2021-01-30 (×3): 500 mg via INTRAVENOUS
  Filled 2021-01-27 (×3): qty 500

## 2021-01-27 MED ORDER — OLMESARTAN MEDOXOMIL-HCTZ 20-12.5 MG PO TABS: 1.0000 | ORAL_TABLET | Freq: Every day | ORAL | Status: DC

## 2021-01-27 MED ORDER — SODIUM CHLORIDE 0.9 % IV SOLN
2.0000 g | INTRAVENOUS | Status: DC
  Administered 2021-01-28 – 2021-01-30 (×3): 2 g via INTRAVENOUS
  Filled 2021-01-27: qty 20
  Filled 2021-01-27: qty 2
  Filled 2021-01-27: qty 20

## 2021-01-27 MED ORDER — MOMETASONE FURO-FORMOTEROL FUM 200-5 MCG/ACT IN AERO
2.0000 | INHALATION_SPRAY | Freq: Two times a day (BID) | RESPIRATORY_TRACT | Status: DC
  Administered 2021-01-28 – 2021-01-30 (×5): 2 via RESPIRATORY_TRACT
  Filled 2021-01-27: qty 8.8

## 2021-01-27 MED ORDER — HYDROCHLOROTHIAZIDE 12.5 MG PO CAPS
12.5000 mg | ORAL_CAPSULE | Freq: Every day | ORAL | Status: DC
  Administered 2021-01-28 – 2021-01-30 (×3): 12.5 mg via ORAL
  Filled 2021-01-27 (×3): qty 1

## 2021-01-27 MED ORDER — SODIUM CHLORIDE 0.9 % IV SOLN
500.0000 mg | Freq: Once | INTRAVENOUS | Status: AC
  Administered 2021-01-27: 500 mg via INTRAVENOUS
  Filled 2021-01-27: qty 500

## 2021-01-27 MED ORDER — ALBUTEROL SULFATE HFA 108 (90 BASE) MCG/ACT IN AERS
2.0000 | INHALATION_SPRAY | Freq: Four times a day (QID) | RESPIRATORY_TRACT | Status: DC | PRN
  Filled 2021-01-27: qty 6.7

## 2021-01-27 MED ORDER — BENZONATATE 100 MG PO CAPS
200.0000 mg | ORAL_CAPSULE | Freq: Once | ORAL | Status: AC
  Administered 2021-01-27: 200 mg via ORAL
  Filled 2021-01-27: qty 2

## 2021-01-27 MED ORDER — ALBUTEROL SULFATE (2.5 MG/3ML) 0.083% IN NEBU
2.5000 mg | INHALATION_SOLUTION | Freq: Four times a day (QID) | RESPIRATORY_TRACT | Status: DC
  Administered 2021-01-27: 2.5 mg via RESPIRATORY_TRACT
  Filled 2021-01-27: qty 3

## 2021-01-27 MED ORDER — GUAIFENESIN ER 600 MG PO TB12
600.0000 mg | ORAL_TABLET | Freq: Two times a day (BID) | ORAL | Status: DC
  Administered 2021-01-27 – 2021-01-30 (×6): 600 mg via ORAL
  Filled 2021-01-27 (×6): qty 1

## 2021-01-27 MED ORDER — OXYMETAZOLINE HCL 0.05 % NA SOLN
1.0000 | Freq: Every day | NASAL | Status: AC
  Administered 2021-01-27 – 2021-01-29 (×3): 1 via NASAL
  Filled 2021-01-27: qty 15

## 2021-01-27 MED ORDER — KETOROLAC TROMETHAMINE 15 MG/ML IJ SOLN
15.0000 mg | Freq: Once | INTRAMUSCULAR | Status: AC
  Administered 2021-01-27: 15 mg via INTRAVENOUS
  Filled 2021-01-27: qty 1

## 2021-01-27 MED ORDER — ALBUTEROL SULFATE (2.5 MG/3ML) 0.083% IN NEBU: 2.5000 mg | INHALATION_SOLUTION | RESPIRATORY_TRACT | Status: DC | PRN

## 2021-01-27 MED ORDER — IRBESARTAN 150 MG PO TABS
150.0000 mg | ORAL_TABLET | Freq: Every day | ORAL | Status: DC
  Administered 2021-01-28 – 2021-01-30 (×3): 150 mg via ORAL
  Filled 2021-01-27 (×4): qty 1

## 2021-01-27 MED ORDER — POTASSIUM CHLORIDE 10 MEQ/100ML IV SOLN
10.0000 meq | INTRAVENOUS | Status: AC
  Administered 2021-01-27 (×2): 10 meq via INTRAVENOUS
  Filled 2021-01-27 (×2): qty 100

## 2021-01-27 MED ORDER — LORAZEPAM 2 MG/ML IJ SOLN
1.0000 mg | Freq: Once | INTRAMUSCULAR | Status: AC
  Administered 2021-01-27: 1 mg via INTRAVENOUS
  Filled 2021-01-27: qty 1

## 2021-01-27 MED ORDER — ATORVASTATIN CALCIUM 10 MG PO TABS
20.0000 mg | ORAL_TABLET | Freq: Every day | ORAL | Status: DC
  Administered 2021-01-27 – 2021-01-30 (×4): 20 mg via ORAL
  Filled 2021-01-27 (×4): qty 2

## 2021-01-27 MED ORDER — ACETAMINOPHEN 325 MG PO TABS
650.0000 mg | ORAL_TABLET | Freq: Four times a day (QID) | ORAL | Status: DC | PRN
  Administered 2021-01-27 – 2021-01-30 (×5): 650 mg via ORAL
  Filled 2021-01-27 (×4): qty 2

## 2021-01-27 MED ORDER — SODIUM CHLORIDE 0.9 % IV SOLN
INTRAVENOUS | Status: DC | PRN
  Administered 2021-01-27: 500 mL via INTRAVENOUS

## 2021-01-27 NOTE — Progress Notes (Signed)
Pt. Admitted to the unit via stretcher, alert and oriented. Vital sign taken and recorded. Pt. Is oriented to the room and use of call bell.

## 2021-01-27 NOTE — ED Notes (Signed)
Xray @ [facility]

## 2021-01-27 NOTE — ED Notes (Signed)
Pt c/o of N/V and cough for greater than one week - but is not sure how long. Took meds for nausea last night Has not taken OTC meds for cough "butt huts" - but not sure why "I just feel sick"

## 2021-01-27 NOTE — ED Notes (Signed)
Pt utilized call bell to express "I feel sick" advised pt that we waiting on labs and that the MD will be with the pt as soon as possible

## 2021-01-27 NOTE — ED Notes (Addendum)
Pt states that the medications prescribed for nausea and vomiting (zofran and phenergan) are not working but is not sure when the last time that she uitlized these meds to help with her systems. Denies taking any medications this morning  Has not used inhalers "in a long time" Pt present with moderate cough, with white mucus - no vomiting  Pt has call bell and is on monitor - declined warm blanket at this time  Pt advised that MD would be in shortly and we are waiting on blood draw results

## 2021-01-27 NOTE — H&P (Addendum)
History and Physical    ONESHA KREBBS FYB:[id number] DOB: 1946-02-02 DOA: 01/27/2021  PCP: [name], MD  Patient coming from: Home  Chief Complaint: "I just got sick"  HPI: [name] is a 75 y.o. female with medical history significant of HLD, neuroendocrine tumor. Presenting with intractable N/V. She reports that it started 2 weeks ago. Episodes would last for a day. She'd take zofran/phenergan and it would improve. Then she vomiting would come back a day or two after stopping the anti-emetics. She has had visits to the ED in the last several months for similar symptoms. But has not had any resolution. She decided to come back to the ED for help today.  She reports a persistent cough for the last month. She has not seen anybody about it. It is not productive. She says she is not dyspneic. She denies any other aggravating or alleviating factors.   ED Course: She was found to be hypokalemic. Mild elevation of her CPK. CT chest/ab/pelvis was concerning for PNA and thyroid nodule. She was started on CAP coverage. [facility] was called for admission.   Review of Systems:  Reports persistent cough for 1 month, weakness, N/V, malaise. Denies CP, dyspnea, palpitations, syncopal episodes Review of systems is otherwise negative for all not mentioned in HPI.   PMHx Past Medical History:  Diagnosis Date  . Arthritis   . Asthma   . 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 Complication of anesthesia   . GERD (gastroesophageal reflux disease)    at times  . Hyperlipidemia   . Hypertension   . Peripheral vascular disease ([place])    has spider veins  . Pneumonia   . Pneumothorax, traumatic    RIGHT; associated with multiple rib fractures after a fall  . PONV (postoperative nausea and vomiting)   . Pre-diabetes     PSHx Past Surgical History:  Procedure Laterality Date  . ABDOMINAL HYSTERECTOMY  1982  . AUGMENTATION MAMMAPLASTY Bilateral    Patient has bilateral implants   . AXILLARY SURGERY   2007   due to MRSA  . BREAST SURGERY    . ESOPHAGOGASTRODUODENOSCOPY (EGD) WITH PROPOFOL N/A 01/05/2020   Procedure: ESOPHAGOGASTRODUODENOSCOPY (EGD) WITH PROPOFOL;  Surgeon: Arta Silence, MD;  Location: [facility];  Service: Endoscopy;  Laterality: N/A;  . EUS N/A 01/05/2020   Procedure: UPPER ENDOSCOPIC ULTRASOUND (EUS) LINEAR;  Surgeon: Arta Silence, MD;  Location: [facility];  Service: Endoscopy;  Laterality: N/A;  . FINE NEEDLE ASPIRATION N/A 01/05/2020   Procedure: FINE NEEDLE ASPIRATION (FNA) LINEAR;  Surgeon: Arta Silence, MD;  Location: [facility];  Service: Endoscopy;  Laterality: N/A;  . JOINT REPLACEMENT  2009   right knee  . JOINT REPLACEMENT  2011   left knee  . MASTECTOMY  2008   right breast  . REDUCTION MAMMAPLASTY Left    Patient had lift with implant on Left side   . SHOULDER ARTHROSCOPY DISTAL CLAVICLE EXCISION AND OPEN ROTATOR CUFF REPAIR  2011  . TOTAL SHOULDER ARTHROPLASTY  03/14/2012   Procedure: TOTAL SHOULDER ARTHROPLASTY;  Surgeon: Alta Corning, MD;  Location: [place];  Service: Orthopedics;  Laterality: Left;    SocHx  reports that she has never smoked. She has never used smokeless tobacco. She reports that she does not drink alcohol and does not use drugs.  Allergies  Allergen Reactions  . Dilaudid [Hydromorphone Hcl] Itching  . Morphine And Related Nausea And Vomiting    FamHx Family History  Problem  Relation Age of Onset  . Breast cancer Sister   . Rheum arthritis Sister   . Breast cancer Paternal Grandmother   . Kidney Stones Mother   . CVA Mother   . Hypertension Father   . Anesthesia problems Neg Hx   . Hypotension Neg Hx   . Malignant hyperthermia Neg Hx   . Pseudochol deficiency Neg Hx     Prior to Admission medications   Medication Sig Start Date End Date Taking? Authorizing Provider  albuterol (PROAIR HFA) 108 (90 Base) MCG/ACT inhaler Inhale 2 puffs into the lungs every 6 (six) hours as needed for wheezing or shortness  of breath. 08/12/20  Yes [name], MD  atorvastatin (LIPITOR) 20 MG tablet Take 1 tablet (20 mg total) by mouth daily at 6 PM. 08/12/20  Yes [name], MD  Dextromethorphan-Guaifenesin 20-400 MG TABS Take 1 tablet by mouth at bedtime.    Yes [provider]  diphenhydrAMINE (SOMINEX) 25 MG tablet Take 50 mg by mouth at bedtime.   Yes [provider]  fenofibrate (TRICOR) 145 MG tablet Take 1 tablet (145 mg total) by mouth daily. 08/12/20  Yes [name], MD  gabapentin (NEURONTIN) 300 MG capsule 1 po BID prn. Patient taking differently: Take 300 mg by mouth 3 (three) times daily. 07/01/19  Yes [name], MD  olmesartan-hydrochlorothiazide (BENICAR HCT) 20-12.5 MG tablet Take 1 tablet by mouth daily. 08/12/20  Yes [name], MD  ondansetron (ZOFRAN ODT) 4 MG disintegrating tablet Take 1 tablet (4 mg total) by mouth every 8 (eight) hours as needed for nausea or vomiting. 01/03/21  Yes Nuala Alpha A, PA-C  oxymetazoline (AFRIN) 0.05 % nasal spray Place 1 spray into both nostrils at bedtime.   Yes [provider]  promethazine (PHENERGAN) 25 MG suppository Place 1 suppository (25 mg total) rectally every 6 (six) hours as needed for nausea or vomiting. 01/06/21  Yes Tacy Learn, PA-C  promethazine (PHENERGAN) 25 MG tablet Take 1 tablet (25 mg total) by mouth every 6 (six) hours as needed for nausea or vomiting. 01/06/21  Yes Tacy Learn, PA-C  acetaminophen (TYLENOL) 325 MG tablet Take 650 mg by mouth every 6 (six) hours as needed for mild pain or moderate pain.     [provider]  azithromycin (ZITHROMAX) 250 MG tablet Take 2 tabs on first day. Then take 1 tab daily. Finish entire supply. 11/08/20   Maximiano Coss, NP  fluticasone-salmeterol (ADVAIR HFA) [phone] MCG/ACT inhaler Inhale 2 puffs into the lungs 2 (two) times daily. 08/12/20   [name], MD  ibuprofen (ADVIL,MOTRIN) 100 MG tablet Take 100 mg by mouth  every 6 (six) hours as needed for pain.     [provider]  Multiple Vitamin (MULTIVITAMIN) capsule Take 1 capsule by mouth daily.     [provider]  nystatin (MYCOSTATIN) 100000 UNIT/ML suspension Take 5 mLs (500,000 Units total) by mouth 4 (four) times daily. 11/08/20   Maximiano Coss, NP  potassium chloride (KLOR-CON) 10 MEQ tablet Take 2 tablets (20 mEq total) by mouth daily for 2 days. 01/03/21 01/05/21  Deliah Boston, PA-C  Respiratory Therapy Supplies (FLUTTER) DEVI 1 each by Does not apply route 2 (two) times daily. 07/15/19   Julian Hy, DO  potassium chloride SA (KLOR-CON) 20 MEQ tablet Take 1 tablet (20 mEq total) by mouth 2 (two) times daily. 60/73/71 0/62/69  Delora Fuel, MD    Physical Exam: Vitals:  01/27/21 1217 01/27/21 1230 01/27/21 1300 01/27/21 1441  BP: (!) 164/72 127/65 126/80 133/70  Pulse: 99 99 (!) 115 100  Resp: 18 11 (!) 23 16  Temp:    98.9 F (37.2 C)  TempSrc:      SpO2: 99% 94% 99% 97%  Weight:      Height:        General: 75 y.o. female resting in bed in NAD Eyes: PERRL, normal sclera ENMT: Nares patent w/o discharge, orophaynx clear, dentition normal, ears w/o discharge/lesions/ulcers Neck: Supple, trachea midline Cardiovascular: RRR, +S1, S2, no m/g/r, equal pulses throughout Respiratory: CTABL, no w/r/r, normal WOB GI: BS+, NDNT, no masses noted, no organomegaly noted MSK: No e/c/c Skin: No rashes, bruises, ulcerations noted Neuro: A&O x 3, no focal deficits Psyc: Appropriate interaction and affect, cooperative  Labs on Admission: I have personally reviewed following labs and imaging studies  CBC: Recent Labs  Lab 01/26/21 1440 01/27/21 0835  WBC 11.1* 9.8  NEUTROABS 8.0* 5.7  HGB 14.1 14.0  HCT 41.4 40.1  MCV 98.1 94.4  PLT 428* 833*   Basic Metabolic Panel: Recent Labs  Lab 01/26/21 1440 01/27/21 0835 01/27/21 0946  NA 136 138  --   K 3.0* 3.0*  --   CL 103 102  --   CO2 21* 21*  --   GLUCOSE  134* 149*  --   BUN 16 22  --   CREATININE 0.87 0.73  --   CALCIUM 9.5 9.6  --   MG  --   --  1.9  PHOS  --   --  2.8   GFR: Estimated Creatinine Clearance: 63 mL/min (by C-G formula based on SCr of 0.73 mg/dL). Liver Function Tests: Recent Labs  Lab 01/26/21 1440 01/27/21 0835  AST 16 20  ALT 14 16  ALKPHOS 92 94  BILITOT 0.5 0.7  PROT 7.2 7.7  ALBUMIN 4.0 4.2   Recent Labs  Lab 01/27/21 0946  LIPASE 25   No results for input(s): AMMONIA in the last 168 hours. Coagulation Profile: Recent Labs  Lab 01/27/21 0946  INR 1.0   Cardiac Enzymes: Recent Labs  Lab 01/27/21 0946  CKTOTAL 293*   BNP (last 3 results) No results for input(s): PROBNP in the last 8760 hours. HbA1C: No results for input(s): HGBA1C in the last 72 hours. CBG: No results for input(s): GLUCAP in the last 168 hours. Lipid Profile: No results for input(s): CHOL, HDL, LDLCALC, TRIG, CHOLHDL, LDLDIRECT in the last 72 hours. Thyroid Function Tests: Recent Labs    01/27/21 0946  TSH 0.208*   Anemia Panel: No results for input(s): VITAMINB12, FOLATE, FERRITIN, TIBC, IRON, RETICCTPCT in the last 72 hours. Urine analysis:    Component Value Date/Time   COLORURINE YELLOW 01/03/2021 1924   APPEARANCEUR CLEAR 01/03/2021 1924   LABSPEC 1.020 01/03/2021 1924   PHURINE 6.0 01/03/2021 1924   GLUCOSEU NEGATIVE 01/03/2021 1924   HGBUR NEGATIVE 01/03/2021 1924   BILIRUBINUR SMALL (A) 01/03/2021 1924   BILIRUBINUR negative 12/31/2016 [place] 01/03/2021 1924   PROTEINUR NEGATIVE 01/03/2021 1924   UROBILINOGEN 0.2 12/31/2016 1145   UROBILINOGEN 0.2 03/14/2012 0612   NITRITE NEGATIVE 01/03/2021 1924   LEUKOCYTESUR NEGATIVE 01/03/2021 1924    Radiological Exams on Admission: CT CHEST ABDOMEN PELVIS W CONTRAST  Result Date: 01/27/2021 CLINICAL DATA:  Pt c/o of N/V and cough for greater than one week - but is not sure how long.Took meds for nausea last nightHas not  taken OTC meds for  cough"butt hurts" - but not sure why "I just feel sick" Is prior PET-CT from 02/15/2020 indicates a history of a pancreatic neuroendocrine tumor. EXAM: CT CHEST, ABDOMEN, AND PELVIS WITH CONTRAST TECHNIQUE: Multidetector CT imaging of the chest, abdomen and pelvis was performed following the standard protocol during bolus administration of intravenous contrast. CONTRAST:  175mL OMNIPAQUE IOHEXOL 300 MG/ML  SOLN COMPARISON:  Prior abdomen and pelvis CTs, most recent 01/03/2021. PET-CT, 02/15/2020. FINDINGS: CT CHEST FINDINGS Cardiovascular: Heart normal in size and configuration. Left and right coronary artery calcifications. No pericardial effusion. Minor aortic atherosclerosis. Great vessels are normal in caliber. Mediastinum/Nodes: Enlarged left thyroid lobe stable from the prior PET-CT. Questionable nodule measuring 2.5 cm. No neck base, axillary, mediastinal or hilar masses or enlarged lymph nodes. Trachea and esophagus are unremarkable. Lungs/Pleura: 9 mm pleural base nodule, lateral right lower lobe, image 110, series 4, stable from the prior PET-CT. Peripheral linear and reticular opacities noted on the right, most evident in the lateral right lower and middle lobes, consistent with scarring and/or atelectasis, similar to the prior PET-CT. Reticulonodular type opacities are noted in the anterior left upper lobe centered on image 58, series 4, minimally in the posterior aspect of the right upper lobe, image 32, new since the prior exam. Remainder of the lungs is clear. No pleural effusion or pneumothorax. Musculoskeletal: Multiple old right-sided rib fractures. Left shoulder arthroplasty. Arthropathic changes of the right shoulder. No acute fracture. No bone lesion. CT ABDOMEN PELVIS FINDINGS Hepatobiliary: No focal liver abnormality is seen. No gallstones, gallbladder wall thickening, or biliary dilatation. Pancreas: Focal enhancing lesion measuring 9 mm, posterior body/tail the pancreas, stable. No other  defined mass or lesion. No inflammation or duct dilation. Spleen: Normal in size without focal abnormality. Adrenals/Urinary Tract: Adrenal glands are unremarkable. Kidneys are normal, without renal calculi, focal lesion, or hydronephrosis. Bladder is unremarkable. Stomach/Bowel: Stomach is within normal limits. Appendix appears normal. No evidence of bowel wall thickening, distention, or inflammatory changes. Vascular/Lymphatic: Mild aortic atherosclerosis. No aneurysm. No enlarged lymph nodes. Reproductive: Status post hysterectomy. No adnexal masses. Other: No abdominal wall hernia or abnormality. No abdominopelvic ascites. Musculoskeletal: No fracture or acute finding. No bone lesion. Significant degenerative changes of the lumbar spine including dextroscoliosis. IMPRESSION: 1. Small reticulonodular opacities in the left upper lobe, minimally right upper lobe, new since the prior PET-CT. Atypical infection should be considered. 2. No other evidence of an acute abnormality within the chest, abdomen or pelvis. 3. Possible 2.5 cm thyroid nodule. Recommend thyroid US (ref: J Am Coll Radiol. 2015 Feb;12(2): 143-50). 4. 9 mm nodular opacity at the lateral right lung base stable from the prior PET-CT. No specific follow-up recommended. 5. 9 mm hypervascular well-defined mass in the body/tail the pancreas stable from the prior PET-CT consistent with a well differentiated neuroendocrine tumor. 6. Mild aortic atherosclerosis. Electronically Signed   By: Lajean Manes M.D.   On: 01/27/2021 10:36   DG Chest Port 1 View  Result Date: 01/27/2021 CLINICAL DATA:  Cough. EXAM: PORTABLE CHEST 1 VIEW COMPARISON:  February 12, 2020. FINDINGS: The heart size and mediastinal contours are within normal limits. Both lungs are clear. No pneumothorax or pleural effusion is noted. Old right rib fractures are noted. IMPRESSION: No active disease. Electronically Signed   By: Marijo Conception M.D.   On: 01/27/2021 09:25    EKG:  Independently reviewed. Sinus tach, no st elevations  Assessment/Plan B/l upper lobe PNA?     - places  in tele, obs     - check procal     - started on rocephin/zitrho; can continue for now     - getting sputum     - add guaifenesin     - she's COVID negative     - O2 supplement as needed  Intractable N/V     - no indication of obstruction     - keep NPO excepts sips/meds for now     - compazine, fluids; follow  Low TSH Thyroid nodule     - check FT4, FT3     - thyroid US  Hypokalemia     - replace K+; Mg2+ and Phos are ok  HTN     - continue home regimen  Hx of Neuroendocrine tumor     - continue follow up with [facility]  DVT prophylaxis: SCDs  Code Status: FULL  Family Communication: None at bedside  Consults called: None   Status is: Observation  The patient remains OBS appropriate and will d/c before 2 midnights.  Dispo: The patient is from: Home              Anticipated d/c is to: Home              Patient currently is not medically stable to d/c.   Difficult to place patient No  Time spent coordinating admission: 75 minutes  [facility]  If 7PM-7AM, please contact night-coverage [email or web address]  01/27/2021, 3:47 PM

## 2021-01-27 NOTE — Progress Notes (Signed)
Notified by EDP of need for admission d/t poor PO intake, N/V, CAP. [facility] accepts patient to med-surg bed at [facility]. EDP is to remain responsible for orders/medical decisions while patient is holding at [facility]. Upon arrival to [facility], [facility] will assume care. Nursing staff will call flow manager/carelink to notify them of patient's arrival so that the proper [facility] member may receive the patient. Thank you.

## 2021-01-27 NOTE — ED Notes (Signed)
Pt to CT

## 2021-01-27 NOTE — ED Triage Notes (Signed)
Began feeling "sick" yesterday, coughing, nausea and vomiting.

## 2021-01-27 NOTE — ED Notes (Signed)
RN Anderson Malta unable to take report at this time - [phone] Left # and will call back

## 2021-01-28 DIAGNOSIS — Z803 Family history of malignant neoplasm of breast: Secondary | ICD-10-CM | POA: Diagnosis not present

## 2021-01-28 DIAGNOSIS — Z885 Allergy status to narcotic agent status: Secondary | ICD-10-CM | POA: Diagnosis not present

## 2021-01-28 DIAGNOSIS — Z96612 Presence of left artificial shoulder joint: Secondary | ICD-10-CM | POA: Diagnosis present

## 2021-01-28 DIAGNOSIS — Z90710 Acquired absence of both cervix and uterus: Secondary | ICD-10-CM | POA: Diagnosis not present

## 2021-01-28 DIAGNOSIS — Z79899 Other long term (current) drug therapy: Secondary | ICD-10-CM | POA: Diagnosis not present

## 2021-01-28 DIAGNOSIS — E042 Nontoxic multinodular goiter: Secondary | ICD-10-CM | POA: Diagnosis present

## 2021-01-28 DIAGNOSIS — Z96653 Presence of artificial knee joint, bilateral: Secondary | ICD-10-CM | POA: Diagnosis present

## 2021-01-28 DIAGNOSIS — Z8249 Family history of ischemic heart disease and other diseases of the circulatory system: Secondary | ICD-10-CM | POA: Diagnosis not present

## 2021-01-28 DIAGNOSIS — Z20822 Contact with and (suspected) exposure to covid-19: Secondary | ICD-10-CM | POA: Diagnosis present

## 2021-01-28 DIAGNOSIS — Z8261 Family history of arthritis: Secondary | ICD-10-CM | POA: Diagnosis not present

## 2021-01-28 DIAGNOSIS — C7A8 Other malignant neuroendocrine tumors: Secondary | ICD-10-CM | POA: Diagnosis present

## 2021-01-28 DIAGNOSIS — R112 Nausea with vomiting, unspecified: Secondary | ICD-10-CM | POA: Diagnosis present

## 2021-01-28 DIAGNOSIS — Z823 Family history of stroke: Secondary | ICD-10-CM | POA: Diagnosis not present

## 2021-01-28 DIAGNOSIS — I10 Essential (primary) hypertension: Secondary | ICD-10-CM | POA: Diagnosis present

## 2021-01-28 DIAGNOSIS — E785 Hyperlipidemia, unspecified: Secondary | ICD-10-CM | POA: Diagnosis present

## 2021-01-28 DIAGNOSIS — E876 Hypokalemia: Secondary | ICD-10-CM | POA: Diagnosis present

## 2021-01-28 DIAGNOSIS — Z9012 Acquired absence of left breast and nipple: Secondary | ICD-10-CM | POA: Diagnosis not present

## 2021-01-28 DIAGNOSIS — J189 Pneumonia, unspecified organism: Secondary | ICD-10-CM | POA: Diagnosis present

## 2021-01-28 LAB — COMPREHENSIVE METABOLIC PANEL
ALT: 13 U/L (ref 0–44)
AST: 19 U/L (ref 15–41)
Albumin: 3.5 g/dL (ref 3.5–5.0)
Alkaline Phosphatase: 70 U/L (ref 38–126)
Anion gap: 12 (ref 5–15)
BUN: 18 mg/dL (ref 8–23)
CO2: 21 mmol/L — ABNORMAL LOW (ref 22–32)
Calcium: 8.9 mg/dL (ref 8.9–10.3)
Chloride: 106 mmol/L (ref 98–111)
Creatinine, Ser: 0.62 mg/dL (ref 0.44–1.00)
GFR, Estimated: >60 mL/min (ref >60)
Glucose, Bld: 121 mg/dL — ABNORMAL HIGH (ref 70–99)
Potassium: 2.9 mmol/L — ABNORMAL LOW (ref 3.5–5.1)
Sodium: 139 mmol/L (ref 135–145)
Total Bilirubin: 0.9 mg/dL (ref 0.3–1.2)
Total Protein: 6.3 g/dL — ABNORMAL LOW (ref 6.5–8.1)

## 2021-01-28 LAB — CBC
HCT: 32.6 % — ABNORMAL LOW (ref 36.0–46.0)
Hemoglobin: 10.9 g/dL — ABNORMAL LOW (ref 12.0–15.0)
MCH: 32.8 pg (ref 26.0–34.0)
MCHC: 33.4 g/dL (ref 30.0–36.0)
MCV: 98.2 fL (ref 80.0–100.0)
Platelets: 324 10*3/uL (ref 150–400)
RBC: 3.32 MIL/uL — ABNORMAL LOW (ref 3.87–5.11)
RDW: 12.1 % (ref 11.5–15.5)
WBC: 8.8 10*3/uL (ref 4.0–10.5)
nRBC: 0 % (ref 0.0–0.2)

## 2021-01-28 LAB — T3, FREE: T3, Free: 3.7 pg/mL (ref 2.0–4.4)

## 2021-01-28 LAB — PROCALCITONIN: Procalcitonin: 9.26 ng/mL

## 2021-01-28 MED ORDER — SODIUM CHLORIDE 0.45 % IV SOLN: INTRAVENOUS | Status: DC

## 2021-01-28 MED ORDER — GUAIFENESIN-DM 100-10 MG/5ML PO SYRP
5.0000 mL | ORAL_SOLUTION | ORAL | Status: DC | PRN
  Administered 2021-01-28 – 2021-01-29 (×5): 5 mL via ORAL
  Filled 2021-01-28 (×6): qty 5

## 2021-01-28 MED ORDER — KETOROLAC TROMETHAMINE 15 MG/ML IJ SOLN
15.0000 mg | Freq: Once | INTRAMUSCULAR | Status: AC
  Administered 2021-01-28: 15 mg via INTRAVENOUS
  Filled 2021-01-28: qty 1

## 2021-01-28 MED ORDER — ONDANSETRON HCL 4 MG/2ML IJ SOLN
4.0000 mg | Freq: Three times a day (TID) | INTRAMUSCULAR | Status: DC
  Administered 2021-01-28 – 2021-01-30 (×6): 4 mg via INTRAVENOUS
  Filled 2021-01-28 (×6): qty 2

## 2021-01-28 MED ORDER — KCL IN DEXTROSE-NACL 40-5-0.45 MEQ/L-%-% IV SOLN
INTRAVENOUS | Status: DC
  Administered 2021-01-28: 950 mL via INTRAVENOUS
  Filled 2021-01-28 (×6): qty 1000

## 2021-01-28 MED ORDER — MAGNESIUM SULFATE 2 GM/50ML IV SOLN
2.0000 g | Freq: Once | INTRAVENOUS | Status: AC
  Administered 2021-01-28: 2 g via INTRAVENOUS
  Filled 2021-01-28: qty 50

## 2021-01-28 MED ORDER — ENOXAPARIN SODIUM 40 MG/0.4ML ~~LOC~~ SOLN
40.0000 mg | SUBCUTANEOUS | Status: DC
  Administered 2021-01-28 – 2021-01-29 (×2): 40 mg via SUBCUTANEOUS
  Filled 2021-01-28 (×2): qty 0.4

## 2021-01-28 NOTE — Progress Notes (Signed)
PROGRESS NOTE  [name]  FOY:[id number] DOB: 05/12/1946 DOA: 01/27/2021 PCP: [name], MD  Outpatient Specialists: [facility] Narrative: [name] is a 75 y.o. female with a history of neuroendocrine tumor of the pancreas, HLD who presented to the ED with intractable nausea and vomiting intermittently for a couple weeks for which she's presented to the ED several times. She also reported persistent cough. CT chest/abdomen/pelvis was concerning for pneumonia without intraabdominal significant finding and thyroid nodules. IV fluids, IV antiemetics, and IV antibiotics were given and the patient was admitted. She continues to have significant dry heaving.   Assessment & Plan: Active Problems:   Intractable nausea and vomiting  Intractable nausea and vomiting:  - Schedule IV zofran - Continue prn compazine - Trial clear liquids - Remains IV fluid-dependent  Hypokalemia: Despite supplementation. - Augment supplementation in IVF. Give Mg, recheck both in AM.   CAP: PCT severely elevated, though pt afebrile without leukocytosis. Reticulonodular opacities in LUL > RUL on CT felt to represent pneumonia. - Continue antibiotics - No cultures drawn at admission, would draw cultures if febrile. - Trend PCT.   Thyroid nodules: CT showed enlarged left thyroid lobe which was stable from prior PET-CT. - A single nodule in inferior left thyroid lobe is TI-RADS 3, meeting criteria for FNA.  - Will need further work up per PCP.  - TSH mildly low, free T4 mildly elevated, free T3 wnl. Pt denies symptoms of hyperthyroidism.   Pancreatic neuroendocrine tumor well-differentiated: s/p aspiration by Dr. Paulita Fujita March 2021. No appreciable imaging changes since 2009.  - Continue regular imaging.   HTN:  - No changes  DVT prophylaxis: Lovenox Code Status: Full Family Communication: None at bedside Disposition Plan:  Status is: Inpatient  Remains inpatient appropriate because:IV  treatments appropriate due to intensity of illness or inability to take PO   Dispo: The patient is from: Home              Anticipated d/c is to: Home              Patient currently is not medically stable to d/c.   Difficult to place patient No  Consultants:   None  Procedures:   None  Antimicrobials:  CTX, azithromycin   Subjective: Has had multiple episodes of emesis of stomach contents/dry heaves this AM but wants to try a diet. No abd pain. No dyspnea, but has had a cough. No chest pain. Wants to go home but hasn't eaten anything and only taking little sips of water/ice thus far.   Objective: Vitals:   01/27/21 2058 01/27/21 2232 01/28/21 0225 01/28/21 0806  BP:  (!) 169/73 (!) 171/82   Pulse:  (!) 109 (!) 103   Resp:  20 20   Temp:  98.8 F (37.1 C) 98.7 F (37.1 C)   TempSrc:  Oral Oral   SpO2: 98% 95% 94% 99%  Weight:      Height:        Intake/Output Summary (Last 24 hours) at 01/28/2021 1148 Last data filed at 01/28/2021 0306 Gross per 24 hour  Intake 2976.45 ml  Output 300 ml  Net 2676.45 ml   Filed Weights   01/27/21 0824 01/27/21 1928  Weight: 75.3 kg 75.6 kg    Gen: 75 y.o. female appearing chronically ill.  Neck: Nontender thyroid with possible left thyroid enlargement. Pulm: Non-labored breathing. Clear to auscultation bilaterally.  CV: Regular rate and rhythm. No murmur, rub, or gallop. No JVD,  no pedal edema. GI: Abdomen soft, non-tender, non-distended, with normoactive bowel sounds. No organomegaly or masses felt. Ext: Warm, no deformities Skin: No rashes, lesions or ulcers Neuro: Alert and oriented. No focal neurological deficits. Psych: Judgement and insight appear normal. Mood & affect appropriate.   Data Reviewed: I have personally reviewed following labs and imaging studies  CBC: Recent Labs  Lab 01/26/21 1440 01/27/21 0835 01/28/21 0501  WBC 11.1* 9.8 8.8  NEUTROABS 8.0* 5.7  --   HGB 14.1 14.0 10.9*  HCT 41.4 40.1 32.6*   MCV 98.1 94.4 98.2  PLT 428* 429* 962   Basic Metabolic Panel: Recent Labs  Lab 01/26/21 1440 01/27/21 0835 01/27/21 0946 01/28/21 0501  NA 136 138  --  139  K 3.0* 3.0*  --  2.9*  CL 103 102  --  106  CO2 21* 21*  --  21*  GLUCOSE 134* 149*  --  121*  BUN 16 22  --  18  CREATININE 0.87 0.73  --  0.62  CALCIUM 9.5 9.6  --  8.9  MG  --   --  1.9  --   PHOS  --   --  2.8  --    GFR: Estimated Creatinine Clearance: 63.1 mL/min (by C-G formula based on SCr of 0.62 mg/dL). Liver Function Tests: Recent Labs  Lab 01/26/21 1440 01/27/21 0835 01/28/21 0501  AST 16 20 19   ALT 14 16 13   ALKPHOS 92 94 70  BILITOT 0.5 0.7 0.9  PROT 7.2 7.7 6.3*  ALBUMIN 4.0 4.2 3.5   Recent Labs  Lab 01/27/21 0946  LIPASE 25   No results for input(s): AMMONIA in the last 168 hours. Coagulation Profile: Recent Labs  Lab 01/27/21 0946  INR 1.0   Cardiac Enzymes: Recent Labs  Lab 01/27/21 0946  CKTOTAL 293*   BNP (last 3 results) No results for input(s): PROBNP in the last 8760 hours. HbA1C: No results for input(s): HGBA1C in the last 72 hours. CBG: No results for input(s): GLUCAP in the last 168 hours. Lipid Profile: No results for input(s): CHOL, HDL, LDLCALC, TRIG, CHOLHDL, LDLDIRECT in the last 72 hours. Thyroid Function Tests: Recent Labs    01/27/21 0946 01/27/21 1758  TSH 0.208*  --   FREET4  --  1.41*  T3FREE  --  3.7   Anemia Panel: No results for input(s): VITAMINB12, FOLATE, FERRITIN, TIBC, IRON, RETICCTPCT in the last 72 hours. Urine analysis:    Component Value Date/Time   COLORURINE YELLOW 01/03/2021 1924   APPEARANCEUR CLEAR 01/03/2021 1924   LABSPEC 1.020 01/03/2021 1924   PHURINE 6.0 01/03/2021 1924   GLUCOSEU NEGATIVE 01/03/2021 1924   HGBUR NEGATIVE 01/03/2021 1924   BILIRUBINUR SMALL (A) 01/03/2021 1924   BILIRUBINUR negative 12/31/2016 1145   KETONESUR NEGATIVE 01/03/2021 1924   PROTEINUR NEGATIVE 01/03/2021 1924   UROBILINOGEN 0.2  12/31/2016 1145   UROBILINOGEN 0.2 03/14/2012 0612   NITRITE NEGATIVE 01/03/2021 1924   LEUKOCYTESUR NEGATIVE 01/03/2021 1924   Recent Results (from the past 240 hour(s))  Resp Panel by RT-PCR (Flu A&B, Covid) Nasopharyngeal Swab     Status: None   Collection Time: 01/27/21  9:30 AM   Specimen: Nasopharyngeal Swab; Nasopharyngeal(NP) swabs in vial transport medium  Result Value Ref Range Status   SARS Coronavirus 2 by RT PCR NEGATIVE NEGATIVE Final    Comment: (NOTE) SARS-CoV-2 target nucleic acids are NOT DETECTED.  The SARS-CoV-2 RNA is generally detectable in upper respiratory specimens during the acute  phase of infection. The lowest concentration of SARS-CoV-2 viral copies this assay can detect is 138 copies/mL. A negative result does not preclude SARS-Cov-2 infection and should not be used as the sole basis for treatment or other patient management decisions. A negative result may occur with  improper specimen collection/handling, submission of specimen other than nasopharyngeal swab, presence of viral mutation(s) within the areas targeted by this assay, and inadequate number of viral copies(<138 copies/mL). A negative result must be combined with clinical observations, patient history, and epidemiological information. The expected result is Negative.  Fact Sheet for Patients:  EntrepreneurPulse.com.au  Fact Sheet for Healthcare Providers:  IncredibleEmployment.be  This test is no t yet approved or cleared by the [place] FDA and  has been authorized for detection and/or diagnosis of SARS-CoV-2 by FDA under an Emergency Use Authorization (EUA). This EUA will remain  in effect (meaning this test can be used) for the duration of the COVID-19 declaration under Section 564(b)(1) of the Act, 21 U.S.C.section 360bbb-3(b)(1), unless the authorization is terminated  or revoked sooner.       Influenza A by PCR NEGATIVE NEGATIVE Final    Influenza B by PCR NEGATIVE NEGATIVE Final    Comment: (NOTE) The Xpert Xpress SARS-CoV-2/FLU/RSV plus assay is intended as an aid in the diagnosis of influenza from Nasopharyngeal swab specimens and should not be used as a sole basis for treatment. Nasal washings and aspirates are unacceptable for Xpert Xpress SARS-CoV-2/FLU/RSV testing.  Fact Sheet for Patients: EntrepreneurPulse.com.au  Fact Sheet for Healthcare Providers: IncredibleEmployment.be  This test is not yet approved or cleared by the [place] FDA and has been authorized for detection and/or diagnosis of SARS-CoV-2 by FDA under an Emergency Use Authorization (EUA). This EUA will remain in effect (meaning this test can be used) for the duration of the COVID-19 declaration under Section 564(b)(1) of the Act, 21 U.S.C. section 360bbb-3(b)(1), unless the authorization is terminated or revoked.  Performed at [facility], [address] [place], [place] [address]       Radiology Studies: CT CHEST ABDOMEN PELVIS W CONTRAST  Result Date: 01/27/2021 CLINICAL DATA:  Pt c/o of N/V and cough for greater than one week - but is not sure how long.Took meds for nausea last nightHas not taken OTC meds for cough"butt hurts" - but not sure why "I just feel sick" Is prior PET-CT from 02/15/2020 indicates a history of a pancreatic neuroendocrine tumor. EXAM: CT CHEST, ABDOMEN, AND PELVIS WITH CONTRAST TECHNIQUE: Multidetector CT imaging of the chest, abdomen and pelvis was performed following the standard protocol during bolus administration of intravenous contrast. CONTRAST:  170mL OMNIPAQUE IOHEXOL 300 MG/ML  SOLN COMPARISON:  Prior abdomen and pelvis CTs, most recent 01/03/2021. PET-CT, 02/15/2020. FINDINGS: CT CHEST FINDINGS Cardiovascular: Heart normal in size and configuration. Left and right coronary artery calcifications. No pericardial effusion. Minor aortic atherosclerosis. Great  vessels are normal in caliber. Mediastinum/Nodes: Enlarged left thyroid lobe stable from the prior PET-CT. Questionable nodule measuring 2.5 cm. No neck base, axillary, mediastinal or hilar masses or enlarged lymph nodes. Trachea and esophagus are unremarkable. Lungs/Pleura: 9 mm pleural base nodule, lateral right lower lobe, image 110, series 4, stable from the prior PET-CT. Peripheral linear and reticular opacities noted on the right, most evident in the lateral right lower and middle lobes, consistent with scarring and/or atelectasis, similar to the prior PET-CT. Reticulonodular type opacities are noted in the anterior left upper lobe centered on image 58, series 4, minimally  in the posterior aspect of the right upper lobe, image 32, new since the prior exam. Remainder of the lungs is clear. No pleural effusion or pneumothorax. Musculoskeletal: Multiple old right-sided rib fractures. Left shoulder arthroplasty. Arthropathic changes of the right shoulder. No acute fracture. No bone lesion. CT ABDOMEN PELVIS FINDINGS Hepatobiliary: No focal liver abnormality is seen. No gallstones, gallbladder wall thickening, or biliary dilatation. Pancreas: Focal enhancing lesion measuring 9 mm, posterior body/tail the pancreas, stable. No other defined mass or lesion. No inflammation or duct dilation. Spleen: Normal in size without focal abnormality. Adrenals/Urinary Tract: Adrenal glands are unremarkable. Kidneys are normal, without renal calculi, focal lesion, or hydronephrosis. Bladder is unremarkable. Stomach/Bowel: Stomach is within normal limits. Appendix appears normal. No evidence of bowel wall thickening, distention, or inflammatory changes. Vascular/Lymphatic: Mild aortic atherosclerosis. No aneurysm. No enlarged lymph nodes. Reproductive: Status post hysterectomy. No adnexal masses. Other: No abdominal wall hernia or abnormality. No abdominopelvic ascites. Musculoskeletal: No fracture or acute finding. No bone lesion.  Significant degenerative changes of the lumbar spine including dextroscoliosis. IMPRESSION: 1. Small reticulonodular opacities in the left upper lobe, minimally right upper lobe, new since the prior PET-CT. Atypical infection should be considered. 2. No other evidence of an acute abnormality within the chest, abdomen or pelvis. 3. Possible 2.5 cm thyroid nodule. Recommend thyroid US (ref: J Am Coll Radiol. 2015 Feb;12(2): 143-50). 4. 9 mm nodular opacity at the lateral right lung base stable from the prior PET-CT. No specific follow-up recommended. 5. 9 mm hypervascular well-defined mass in the body/tail the pancreas stable from the prior PET-CT consistent with a well differentiated neuroendocrine tumor. 6. Mild aortic atherosclerosis. Electronically Signed   By: Lajean Manes M.D.   On: 01/27/2021 10:36   DG Chest Port 1 View  Result Date: 01/27/2021 CLINICAL DATA:  Cough. EXAM: PORTABLE CHEST 1 VIEW COMPARISON:  February 12, 2020. FINDINGS: The heart size and mediastinal contours are within normal limits. Both lungs are clear. No pneumothorax or pleural effusion is noted. Old right rib fractures are noted. IMPRESSION: No active disease. Electronically Signed   By: Marijo Conception M.D.   On: 01/27/2021 09:25   US THYROID  Result Date: 01/28/2021 CLINICAL DATA:  Thyroid nodule EXAM: THYROID ULTRASOUND TECHNIQUE: Ultrasound examination of the thyroid gland and adjacent soft tissues was performed. COMPARISON:  CT chest, abdomen, and pelvis 01/27/2021 FINDINGS: Parenchymal Echotexture: Mildly heterogeneous Isthmus: 0.2 cm Right lobe: 4.2 x 1.1 x 1.3 cm Left lobe: 5.2 x 2.4 x 2.2 cm _________________________________________________________ Estimated total number of nodules >/= 1 cm: 2 Number of spongiform nodules >/=  2 cm not described below (TR1): 0 Number of mixed cystic and solid nodules >/= 1.5 cm not described below ([place]): 0 _________________________________________________________ Nodule # 4: Location: Left;  inferior Maximum size: 3.0 cm; Other 2 dimensions: 2.9 x 1.8 cm Composition: solid/almost completely solid (2) Echogenicity: isoechoic (1) Shape: not taller-than-wide (0) Margins: smooth (0) Echogenic foci: none (0) ACR TI-RADS total points: 3. ACR TI-RADS risk category: TR3 (3 points). ACR TI-RADS recommendations: **Given size (>/= 2.5 cm) and appearance, fine needle aspiration of this mildly suspicious nodule should be considered based on TI-RADS criteria. _________________________________________________________ Remaining thyroid nodules do not meet criteria for FNA or imaging follow-up. IMPRESSION: Nodule 4 (TI-RADS 3) located in the inferior left thyroid lobe, measuring 3.0 x 2.9 x 1.8 cm, meets criteria for FNA. The above is in keeping with the ACR TI-RADS recommendations - J Am Coll Radiol 2017;14:587-595. Electronically Signed   By:  Sharen Heck  Mir M.D.   On: 01/28/2021 08:09    Scheduled Meds: . albuterol  2.5 mg Nebulization BID  . atorvastatin  20 mg Oral Daily  . fenofibrate  160 mg Oral Daily  . guaiFENesin  600 mg Oral BID  . irbesartan  150 mg Oral Daily   And  . hydrochlorothiazide  12.5 mg Oral Daily  . mometasone-formoterol  2 puff Inhalation BID  . ondansetron (ZOFRAN) IV  4 mg Intravenous Q8H  . oxymetazoline  1 spray Each Nare QHS   Continuous Infusions: . sodium chloride 500 mL (01/27/21 1204)  . azithromycin    . cefTRIAXone (ROCEPHIN)  IV    . dextrose 5 % and 0.45 % NaCl with KCl 40 mEq/L 950 mL (01/28/21 0821)  . magnesium sulfate bolus IVPB       LOS: 0 days   Time spent: 25 minutes.  Patrecia Pour, MD [facility] [email or web address] 01/28/2021, 11:48 AM

## 2021-01-28 NOTE — Evaluation (Signed)
Physical Therapy Evaluation Patient Details Name: [name] MRN: [id number] DOB: [birth year] Today's Date: 01/28/2021   History of Present Illness  75 y.o. female with a history of neuroendocrine tumor of the pancreas,  who presented to the ED with intractable nausea and vomiting intermittently for a couple weeks for which she's presented to the ED several times. She also reported persistent cough. CT chest/abdomen/pelvis was concerning for pneumonia without intraabdominal significant finding and thyroid nodules.  Clinical Impression  Pt admitted with above diagnosis. Pt is agreeable to mobility however limited by pain, weakness. Able to amb short distance in hallway transitioning from no device to RW for incr stability. Pt will benefit from HHPT at d/c. Continue to follow   Pt currently with functional limitations due to the deficits listed below (see PT Problem List). Pt will benefit from skilled PT to increase their independence and safety with mobility to allow discharge to the venue listed below.       Follow Up Recommendations Home health PT    Equipment Recommendations  Rolling walker with 5" wheels    Recommendations for Other Services       Precautions / Restrictions Precautions Precautions: Fall Restrictions Weight Bearing Restrictions: No      Mobility  Bed Mobility Overal bed mobility: Needs Assistance Bed Mobility: Supine to Sit;Sit to Supine     Supine to sit: Min guard Sit to supine: Min guard   General bed mobility comments: incr time, requests assist to come to sit however is able to complete with cues to self assist, min/guard for safety    Transfers Overall transfer level: Needs assistance Equipment used: None Transfers: Sit to/from Stand Sit to Stand: Min guard;Min assist         General transfer comment: steadying assist to rise, wider BOS to balance  Ambulation/Gait Ambulation/Gait assistance: Min assist Gait Distance (Feet): 40  Feet Assistive device: Rolling walker (2 wheeled) Gait Pattern/deviations: Decreased step length - right;Decreased step length - left;Narrow base of support     General Gait Details: intermittent assist to steady and maneuver RW. initiated gait without RW however pt with c/o pain LEs (reports baseline sciatic type pain)and very unsteady. improved gait stability with UE support/RW  Stairs            Wheelchair Mobility    Modified Rankin (Stroke Patients Only)       Balance Overall balance assessment: Needs assistance Sitting-balance support: No upper extremity supported;Feet supported Sitting balance-Leahy Scale: Good       Standing balance-Leahy Scale: Fair                               Pertinent Vitals/Pain Pain Assessment: Faces Faces Pain Scale: Hurts even more Pain Location: bil LEs Pain Descriptors / Indicators: Discomfort;Grimacing;Shooting Pain Intervention(s): Limited activity within patient's tolerance;Monitored during session    [place] expects to be discharged to:: Private residence Living Arrangements: Other relatives (grandson)             Home Equipment: None      Prior Function Level of Independence: Independent               Hand Dominance        Extremity/Trunk Assessment   Upper Extremity Assessment Upper Extremity Assessment: Defer to OT evaluation    Lower Extremity Assessment Lower Extremity Assessment: Generalized weakness       Communication   Communication: No difficulties  Cognition Arousal/Alertness: Awake/alert Behavior During Therapy: WFL for tasks assessed/performed Overall Cognitive Status: Within Functional Limits for tasks assessed                                        General Comments      Exercises Other Exercises Other Exercises: instructed in single knee to chest and pirformis stretch in supine   Assessment/Plan    PT Assessment Patient needs  continued PT services  PT Problem List Decreased strength;Decreased mobility;Decreased activity tolerance;Decreased balance;Decreased knowledge of use of DME;Pain       PT Treatment Interventions DME instruction;Therapeutic activities;Gait training;Functional mobility training;Therapeutic exercise;Patient/family education;Balance training    PT Goals (Current goals can be found in the Care Plan section)  Acute Rehab PT Goals PT Goal Formulation: With patient Time For Goal Achievement: 02/11/21 Potential to Achieve Goals: Good    Frequency Min 3X/week   Barriers to discharge        Co-evaluation               AM-PAC PT "6 Clicks" Mobility  Outcome Measure Help needed turning from your back to your side while in a flat bed without using bedrails?: A Little Help needed moving from lying on your back to sitting on the side of a flat bed without using bedrails?: A Little Help needed moving to and from a bed to a chair (including a wheelchair)?: A Little Help needed standing up from a chair using your arms (e.g., wheelchair or bedside chair)?: A Little Help needed to walk in hospital room?: A Little Help needed climbing 3-5 steps with a railing? : A Lot 6 Click Score: 17    End of Session   Activity Tolerance: Patient limited by fatigue;Patient limited by pain Patient left: in bed;with call bell/phone within reach;with bed alarm set Nurse Communication: Mobility status PT Visit Diagnosis: Other abnormalities of gait and mobility (R26.89)    Time: 1445-1501 PT Time Calculation (min) (ACUTE ONLY): 16 min   Charges:   PT Evaluation $PT Eval Low Complexity: [place], PT  Acute Rehab Dept ([place]) [phone] Pager [phone]  01/28/2021   [facility] 01/28/2021, 3:13 PM

## 2021-01-29 LAB — COMPREHENSIVE METABOLIC PANEL
ALT: 13 U/L (ref 0–44)
AST: 20 U/L (ref 15–41)
Albumin: 3.7 g/dL (ref 3.5–5.0)
Alkaline Phosphatase: 68 U/L (ref 38–126)
Anion gap: 11 (ref 5–15)
BUN: 6 mg/dL — ABNORMAL LOW (ref 8–23)
CO2: 24 mmol/L (ref 22–32)
Calcium: 8.8 mg/dL — ABNORMAL LOW (ref 8.9–10.3)
Chloride: 103 mmol/L (ref 98–111)
Creatinine, Ser: 0.57 mg/dL (ref 0.44–1.00)
GFR, Estimated: >60 mL/min (ref >60)
Glucose, Bld: 134 mg/dL — ABNORMAL HIGH (ref 70–99)
Potassium: 3.2 mmol/L — ABNORMAL LOW (ref 3.5–5.1)
Sodium: 138 mmol/L (ref 135–145)
Total Bilirubin: 0.6 mg/dL (ref 0.3–1.2)
Total Protein: 6.6 g/dL (ref 6.5–8.1)

## 2021-01-29 LAB — CBC
HCT: 33.9 % — ABNORMAL LOW (ref 36.0–46.0)
Hemoglobin: 11.2 g/dL — ABNORMAL LOW (ref 12.0–15.0)
MCH: 32.4 pg (ref 26.0–34.0)
MCHC: 33 g/dL (ref 30.0–36.0)
MCV: 98 fL (ref 80.0–100.0)
Platelets: 345 10*3/uL (ref 150–400)
RBC: 3.46 MIL/uL — ABNORMAL LOW (ref 3.87–5.11)
RDW: 12.4 % (ref 11.5–15.5)
WBC: 7.9 10*3/uL (ref 4.0–10.5)
nRBC: 0 % (ref 0.0–0.2)

## 2021-01-29 LAB — PROCALCITONIN: Procalcitonin: 3.19 ng/mL

## 2021-01-29 MED ORDER — TRAMADOL HCL 50 MG PO TABS
50.0000 mg | ORAL_TABLET | Freq: Once | ORAL | Status: AC
Start: 2021-01-29 — End: 2021-01-29
  Administered 2021-01-29: 50 mg via ORAL
  Filled 2021-01-29: qty 1

## 2021-01-29 MED ORDER — KETOROLAC TROMETHAMINE 15 MG/ML IJ SOLN
15.0000 mg | Freq: Once | INTRAMUSCULAR | Status: AC
  Administered 2021-01-29: 15 mg via INTRAVENOUS
  Filled 2021-01-29: qty 1

## 2021-01-29 MED ORDER — GABAPENTIN 100 MG PO CAPS
200.0000 mg | ORAL_CAPSULE | Freq: Once | ORAL | Status: AC
  Administered 2021-01-29: 200 mg via ORAL
  Filled 2021-01-29: qty 2

## 2021-01-29 NOTE — Progress Notes (Signed)
PROGRESS NOTE  [name]  ZHY:[id number] DOB: 1946-10-05 DOA: 01/27/2021 PCP: [name], MD  Outpatient Specialists: [facility] Narrative: [name] is a 75 y.o. female with a history of neuroendocrine tumor of the pancreas, HLD who presented to the ED with intractable nausea and vomiting intermittently for a couple weeks for which she's presented to the ED several times. She also reported persistent cough. CT chest/abdomen/pelvis was concerning for pneumonia without intraabdominal significant finding and thyroid nodules. IV fluids, IV antiemetics, and IV antibiotics were given and the patient was admitted. Cough continues. Vomiting has improved with tolerance of minimal clear liquids. Diet to be advanced with scheduled and prn antiemetics to continue.  Assessment & Plan: Active Problems:   Intractable nausea and vomiting  Intractable nausea and vomiting:  - Schedule IV zofran - Continue prn compazine - Start full liquids, ADAT to soft over next 24 hours.  - Pt remains IV fluid-dependent  Hypokalemia: Despite supplementation. - Continue supplementation in IVF.   CAP: PCT severely elevated, though pt afebrile without leukocytosis. Reticulonodular opacities in LUL > RUL on CT felt to represent pneumonia. - Continue antibiotics. PCT responding briskly. - No cultures drawn at admission, would draw cultures if febrile.  Thyroid nodules: CT showed enlarged left thyroid lobe which was stable from prior PET-CT. - A single nodule in inferior left thyroid lobe is TI-RADS 3, meeting criteria for FNA. Will need further work up per PCP.  - TSH mildly low, free T4 mildly elevated, free T3 wnl. Pt denies symptoms of hyperthyroidism.   Pancreatic neuroendocrine tumor well-differentiated: s/p aspiration by Dr. Paulita Fujita March 2021. No appreciable imaging changes since 2009.  - Continue regular imaging.   HTN:  - No changes  DVT prophylaxis: Lovenox Code Status: Full Family  Communication: None at bedside Disposition Plan:  Status is: Inpatient  Remains inpatient appropriate because:IV treatments appropriate due to intensity of illness or inability to take PO  Dispo: The patient is from: Home              Anticipated d/c is to: Home              Patient currently is not medically stable to d/c.   Difficult to place patient No  Consultants:   None  Procedures:   None  Antimicrobials:  CTX, azithromycin   Subjective: No vomiting as of early this morning over past 12 hours. Tolerated water, some clears, but continues to feel just generally awful and nauseated. Sore everywhere but no particular place. No abdominal pain. Cough is persistent, dyspnea improving.   Objective: Vitals:   01/28/21 2017 01/28/21 2114 01/29/21 0612 01/29/21 0842  BP:  (!) 170/81 (!) 165/88   Pulse:  96 92   Resp:  20 20   Temp:  97.8 F (36.6 C) 98 F (36.7 C)   TempSrc:  Oral Oral   SpO2: 99% 93% 95% 95%  Weight:      Height:        Intake/Output Summary (Last 24 hours) at 01/29/2021 1243 Last data filed at 01/28/2021 1849 Gross per 24 hour  Intake 1654 ml  Output --  Net 1654 ml   Filed Weights   01/27/21 0824 01/27/21 1928  Weight: 75.3 kg 75.6 kg   Gen: 75 y.o. female in no distress Pulm: Nonlabored breathing room air. scattered crackles. CV: Regular rate and rhythm. No murmur, rub, or gallop. No JVD, no dependent edema. GI: Abdomen soft, not significantly tender, non-distended, with  normoactive bowel sounds.  Ext: Warm, no deformities Skin: No new rashes, lesions or ulcers on visualized skin. Neuro: Alert and oriented. No focal neurological deficits. Psych: Judgement and insight appear fair. Mood euthymic & affect congruent. Behavior is appropriate.   Data Reviewed: I have personally reviewed following labs and imaging studies  CBC: Recent Labs  Lab 01/26/21 1440 01/27/21 0835 01/28/21 0501 01/29/21 0609  WBC 11.1* 9.8 8.8 7.9  NEUTROABS 8.0*  5.7  --   --   HGB 14.1 14.0 10.9* 11.2*  HCT 41.4 40.1 32.6* 33.9*  MCV 98.1 94.4 98.2 98.0  PLT 428* 429* 324 256   Basic Metabolic Panel: Recent Labs  Lab 01/26/21 1440 01/27/21 0835 01/27/21 0946 01/28/21 0501 01/29/21 0609  NA 136 138  --  139 138  K 3.0* 3.0*  --  2.9* 3.2*  CL 103 102  --  106 103  CO2 21* 21*  --  21* 24  GLUCOSE 134* 149*  --  121* 134*  BUN 16 22  --  18 6*  CREATININE 0.87 0.73  --  0.62 0.57  CALCIUM 9.5 9.6  --  8.9 8.8*  MG  --   --  1.9  --   --   PHOS  --   --  2.8  --   --    GFR: Estimated Creatinine Clearance: 63.1 mL/min (by C-G formula based on SCr of 0.57 mg/dL). Liver Function Tests: Recent Labs  Lab 01/26/21 1440 01/27/21 0835 01/28/21 0501 01/29/21 0609  AST 16 20 19 20   ALT 14 16 13 13   ALKPHOS 92 94 70 68  BILITOT 0.5 0.7 0.9 0.6  PROT 7.2 7.7 6.3* 6.6  ALBUMIN 4.0 4.2 3.5 3.7   Recent Labs  Lab 01/27/21 0946  LIPASE 25   Coagulation Profile: Recent Labs  Lab 01/27/21 0946  INR 1.0   Cardiac Enzymes: Recent Labs  Lab 01/27/21 0946  CKTOTAL 293*   Thyroid Function Tests: Recent Labs    01/27/21 0946 01/27/21 1758  TSH 0.208*  --   FREET4  --  1.41*  T3FREE  --  3.7    Recent Results (from the past 240 hour(s))  Resp Panel by RT-PCR (Flu A&B, Covid) Nasopharyngeal Swab     Status: None   Collection Time: 01/27/21  9:30 AM   Specimen: Nasopharyngeal Swab; Nasopharyngeal(NP) swabs in vial transport medium  Result Value Ref Range Status   SARS Coronavirus 2 by RT PCR NEGATIVE NEGATIVE Final    Comment: (NOTE) SARS-CoV-2 target nucleic acids are NOT DETECTED.  The SARS-CoV-2 RNA is generally detectable in upper respiratory specimens during the acute phase of infection. The lowest concentration of SARS-CoV-2 viral copies this assay can detect is 138 copies/mL. A negative result does not preclude SARS-Cov-2 infection and should not be used as the sole basis for treatment or other patient management  decisions. A negative result may occur with  improper specimen collection/handling, submission of specimen other than nasopharyngeal swab, presence of viral mutation(s) within the areas targeted by this assay, and inadequate number of viral copies(<138 copies/mL). A negative result must be combined with clinical observations, patient history, and epidemiological information. The expected result is Negative.  Fact Sheet for Patients:  EntrepreneurPulse.com.au  Fact Sheet for Healthcare Providers:  IncredibleEmployment.be  This test is no t yet approved or cleared by the [place] FDA and  has been authorized for detection and/or diagnosis of SARS-CoV-2 by FDA under an Emergency Use Authorization (EUA). This  EUA will remain  in effect (meaning this test can be used) for the duration of the COVID-19 declaration under Section 564(b)(1) of the Act, 21 U.S.C.section 360bbb-3(b)(1), unless the authorization is terminated  or revoked sooner.       Influenza A by PCR NEGATIVE NEGATIVE Final   Influenza B by PCR NEGATIVE NEGATIVE Final    Comment: (NOTE) The Xpert Xpress SARS-CoV-2/FLU/RSV plus assay is intended as an aid in the diagnosis of influenza from Nasopharyngeal swab specimens and should not be used as a sole basis for treatment. Nasal washings and aspirates are unacceptable for Xpert Xpress SARS-CoV-2/FLU/RSV testing.  Fact Sheet for Patients: EntrepreneurPulse.com.au  Fact Sheet for Healthcare Providers: IncredibleEmployment.be  This test is not yet approved or cleared by the [place] FDA and has been authorized for detection and/or diagnosis of SARS-CoV-2 by FDA under an Emergency Use Authorization (EUA). This EUA will remain in effect (meaning this test can be used) for the duration of the COVID-19 declaration under Section 564(b)(1) of the Act, 21 U.S.C. section 360bbb-3(b)(1), unless the  authorization is terminated or revoked.  Performed at [facility], [address] [place], [place] [address]       Radiology Studies: US THYROID  Result Date: 01/28/2021 CLINICAL DATA:  Thyroid nodule EXAM: THYROID ULTRASOUND TECHNIQUE: Ultrasound examination of the thyroid gland and adjacent soft tissues was performed. COMPARISON:  CT chest, abdomen, and pelvis 01/27/2021 FINDINGS: Parenchymal Echotexture: Mildly heterogeneous Isthmus: 0.2 cm Right lobe: 4.2 x 1.1 x 1.3 cm Left lobe: 5.2 x 2.4 x 2.2 cm _________________________________________________________ Estimated total number of nodules >/= 1 cm: 2 Number of spongiform nodules >/=  2 cm not described below (TR1): 0 Number of mixed cystic and solid nodules >/= 1.5 cm not described below ([place]): 0 _________________________________________________________ Nodule # 4: Location: Left; inferior Maximum size: 3.0 cm; Other 2 dimensions: 2.9 x 1.8 cm Composition: solid/almost completely solid (2) Echogenicity: isoechoic (1) Shape: not taller-than-wide (0) Margins: smooth (0) Echogenic foci: none (0) ACR TI-RADS total points: 3. ACR TI-RADS risk category: TR3 (3 points). ACR TI-RADS recommendations: **Given size (>/= 2.5 cm) and appearance, fine needle aspiration of this mildly suspicious nodule should be considered based on TI-RADS criteria. _________________________________________________________ Remaining thyroid nodules do not meet criteria for FNA or imaging follow-up. IMPRESSION: Nodule 4 (TI-RADS 3) located in the inferior left thyroid lobe, measuring 3.0 x 2.9 x 1.8 cm, meets criteria for FNA. The above is in keeping with the ACR TI-RADS recommendations - J Am Coll Radiol 2017;14:587-595. Electronically Signed   By: Miachel Roux M.D.   On: 01/28/2021 08:09    Scheduled Meds: . albuterol  2.5 mg Nebulization BID  . atorvastatin  20 mg Oral Daily  . enoxaparin (LOVENOX) injection  40 mg Subcutaneous Q24H  . fenofibrate  160 mg Oral  Daily  . guaiFENesin  600 mg Oral BID  . irbesartan  150 mg Oral Daily   And  . hydrochlorothiazide  12.5 mg Oral Daily  . mometasone-formoterol  2 puff Inhalation BID  . ondansetron (ZOFRAN) IV  4 mg Intravenous Q8H  . oxymetazoline  1 spray Each Nare QHS   Continuous Infusions: . sodium chloride 500 mL (01/27/21 1204)  . azithromycin 500 mg (01/29/21 1050)  . cefTRIAXone (ROCEPHIN)  IV 2 g (01/29/21 1042)  . dextrose 5 % and 0.45 % NaCl with KCl 40 mEq/L 100 mL/hr at 01/29/21 0510     LOS: 1 day   Time spent: 25 minutes.  Patrecia Pour, MD [facility] [email or web address] 01/29/2021, 12:43 PM

## 2021-01-30 LAB — BASIC METABOLIC PANEL
Anion gap: 9 (ref 5–15)
BUN: <5 mg/dL — ABNORMAL LOW (ref 8–23)
CO2: 27 mmol/L (ref 22–32)
Calcium: 9.1 mg/dL (ref 8.9–10.3)
Chloride: 102 mmol/L (ref 98–111)
Creatinine, Ser: 0.6 mg/dL (ref 0.44–1.00)
GFR, Estimated: >60 mL/min (ref >60)
Glucose, Bld: 140 mg/dL — ABNORMAL HIGH (ref 70–99)
Potassium: 4.1 mmol/L (ref 3.5–5.1)
Sodium: 138 mmol/L (ref 135–145)

## 2021-01-30 LAB — MAGNESIUM: Magnesium: 2 mg/dL (ref 1.7–2.4)

## 2021-01-30 MED ORDER — CEFDINIR 300 MG PO CAPS: 300.0000 mg | ORAL_CAPSULE | Freq: Two times a day (BID) | ORAL | 0 refills | Status: AC

## 2021-01-30 MED ORDER — GUAIFENESIN-DM 100-10 MG/5ML PO SYRP: 5.0000 mL | ORAL_SOLUTION | ORAL | 0 refills | Status: DC | PRN

## 2021-01-30 MED ORDER — ONDANSETRON 4 MG PO TBDP: 4.0000 mg | ORAL_TABLET | Freq: Three times a day (TID) | ORAL | 0 refills | Status: DC | PRN

## 2021-01-30 MED ORDER — PROCHLORPERAZINE MALEATE 5 MG PO TABS: 5.0000 mg | ORAL_TABLET | Freq: Three times a day (TID) | ORAL | 0 refills | Status: DC | PRN

## 2021-01-30 MED ORDER — KETOROLAC TROMETHAMINE 15 MG/ML IJ SOLN
15.0000 mg | Freq: Once | INTRAMUSCULAR | Status: AC
  Administered 2021-01-30: 15 mg via INTRAVENOUS
  Filled 2021-01-30: qty 1

## 2021-01-30 NOTE — Progress Notes (Signed)
Physical Therapy Treatment Patient Details Name: [name] MRN: [id number] DOB: January 24, 1946 Today's Date: 01/30/2021    History of Present Illness 75 y.o. female with a history of neuroendocrine tumor of the pancreas,  who presented to the ED with intractable nausea and vomiting intermittently for a couple weeks for which she's presented to the ED several times. She also reported persistent cough. CT chest/abdomen/pelvis was concerning for pneumonia without intraabdominal significant finding and thyroid nodules.    PT Comments    Pt ambulated in hallway with RW and demonstrates improved stability with assistive device.  Pt states she has rollator at home and agreeable to use upon d/c.  Pt anticipates d/c home today.    Follow Up Recommendations  Home health PT     Equipment Recommendations  None recommended by PT (states she has rollator)    Recommendations for Other Services       Precautions / Restrictions Precautions Precautions: Fall Restrictions Weight Bearing Restrictions: No    Mobility  Bed Mobility Overal bed mobility: Modified Independent             General bed mobility comments: pt reports getting up to [facility] independently    Transfers Overall transfer level: Needs assistance Equipment used: Rolling walker (2 wheeled) Transfers: Sit to/from Stand Sit to Stand: Min guard         General transfer comment: min/guard for safety, reliant on UE for assist/support  Ambulation/Gait Ambulation/Gait assistance: Min guard Gait Distance (Feet): 120 Feet Assistive device: Rolling walker (2 wheeled) Gait Pattern/deviations: Step-through pattern;Decreased stride length;Trunk flexed     General Gait Details: utilized RW for more support, pt less stable without UE support, pt has rollator at home and agreeable to use upon d/c.   Stairs             Wheelchair Mobility    Modified Rankin (Stroke Patients Only)       Balance Overall balance  assessment: Needs assistance         Standing balance support: No upper extremity supported Standing balance-Leahy Scale: Fair Standing balance comment: static fair                            Cognition Arousal/Alertness: Awake/alert Behavior During Therapy: WFL for tasks assessed/performed Overall Cognitive Status: Within Functional Limits for tasks assessed                                        Exercises      General Comments        Pertinent Vitals/Pain Pain Assessment: No/denies pain    Home Living                      Prior Function            PT Goals (current goals can now be found in the care plan section) Progress towards PT goals: Progressing toward goals    Frequency    Min 3X/week      PT Plan Current plan remains appropriate    Co-evaluation              AM-PAC PT "6 Clicks" Mobility   Outcome Measure  Help needed turning from your back to your side while in a flat bed without using bedrails?: A Little Help needed moving from lying on your back  to sitting on the side of a flat bed without using bedrails?: A Little Help needed moving to and from a bed to a chair (including a wheelchair)?: A Little Help needed standing up from a chair using your arms (e.g., wheelchair or bedside chair)?: A Little Help needed to walk in hospital room?: A Little Help needed climbing 3-5 steps with a railing? : A Lot 6 Click Score: 17    End of Session Equipment Utilized During Treatment: Gait belt Activity Tolerance: Patient tolerated treatment well Patient left: in bed;with call bell/phone within reach   PT Visit Diagnosis: Other abnormalities of gait and mobility (R26.89)     Time: 1029-1040 PT Time Calculation (min) (ACUTE ONLY): 11 min  Charges:  $Gait Training: 8-22 mins                    [name], DPT Acute Rehabilitation Services Pager: [phone] Office: [phone]  [name]  E 01/30/2021, 12:23 PM

## 2021-01-30 NOTE — Discharge Summary (Signed)
Physician Discharge Summary  [name]:[id number] DOB: 04-Jan-1946 DOA: 01/27/2021  PCP: [name], MD  Admit date: 01/27/2021 Discharge date: 01/30/2021  Admitted From: Home Disposition: Home   Recommendations for Outpatient Follow-up:  1. Follow up with PCP in 1-2 weeks. 1. A single nodule in inferior left thyroid lobe is TI-RADS 3, meeting criteria for FNA. Will need this arranged by PCP. 2. TSH mildly low, free T4 mildly elevated, free T3 wnl. Pt denies symptoms of hyperthyroidism so no Tx initiated. Recommend recheck at follow up.   Home Health: PT Equipment/Devices: RW Discharge Condition: Stable CODE STATUS: Full Diet recommendation: Heart  Brief/Interim Summary: [name] is a 75 y.o. female with a history of well-differentiated PNET, HLD who presented to the ED with intractable nausea and vomiting intermittently for a couple weeks for which she's presented to the ED several times. She also reported persistent cough. CT chest/abdomen/pelvis was concerning for pneumonia without intraabdominal significant finding and thyroid nodules. IV fluids, IV antiemetics, and IV antibiotics were given and the patient was admitted. Vomiting has improved with tolerance of advancing diet.   Discharge Diagnoses:  Active Problems:   Intractable nausea and vomiting  Intractable nausea and vomiting: Improved with IVF, antiemetics. Abd benign. - Continue antiemetics at home.   Hypokalemia: Despite supplementation. - Continue supplementation in IVF.   CAP: PCT severely elevated, though pt afebrile without leukocytosis. Reticulonodular opacities in LUL > RUL on CT felt to represent pneumonia. - Continue antibiotics. PCT responded briskly. - No cultures drawn at admission, would draw cultures if febrile.  Thyroid nodules: CT showed enlarged left thyroid lobe which was stable from prior PET-CT. - A single nodule in inferior left thyroid lobe is TI-RADS 3, meeting criteria  for FNA. Will need further work up per PCP.  - TSH mildly low, free T4 mildly elevated, free T3 wnl. Pt denies symptoms of hyperthyroidism.   Pancreatic neuroendocrine tumor well-differentiated: s/p aspiration by Dr. Paulita Fujita March 2021. No appreciable imaging changes since 2009.  - Continue regular imaging.   HTN:  - No changes  Discharge Instructions Discharge Instructions    Diet - low sodium heart healthy   Complete by: As directed    Discharge instructions   Complete by: As directed    You were admitted for pneumonia and significant nausea and vomiting. With antibiotics and medications for nausea you have improved. You are stable for discharge with the following recommendations:  - Advance your diet slowly, taking zofran as needed for nausea or vomiting. If this is ineffective, you may take compazine (both sent to your pharmacy) - Continue taking antibiotics (cefdinir) twice daily for 5 more days to complete pneumonia treatment. You may also take cough medication as prescribed as needed.  - It is VERY IMPORTANT to follow up with Dr. Carlota Raspberry regarding your thyroid. The thyroid tests were mildly abnormal and there is a nodule on the thyroid that will need to have a fine needle aspirate (FNA) performed for testing.  - Seek medical attention if your develop shortness of breath or chest pain or are unable to tolerate water/medications.   Increase activity slowly   Complete by: As directed      Allergies as of 01/30/2021      Reactions   Dilaudid [hydromorphone Hcl] Itching   Morphine And Related Nausea And Vomiting      Medication List    STOP taking these medications   azithromycin 250 MG tablet Commonly known as: ZITHROMAX   Dextromethorphan-guaiFENesin  20-400 MG Tabs Replaced by: guaiFENesin-dextromethorphan 100-10 MG/5ML syrup   nystatin 100000 UNIT/ML suspension Commonly known as: MYCOSTATIN     TAKE these medications   acetaminophen 325 MG tablet Commonly known as:  TYLENOL Take 650 mg by mouth every 6 (six) hours as needed for mild pain or moderate pain.   Advair HFA 115-21 MCG/ACT inhaler Generic drug: fluticasone-salmeterol Inhale 2 puffs into the lungs 2 (two) times daily.   albuterol 108 (90 Base) MCG/ACT inhaler Commonly known as: ProAir HFA Inhale 2 puffs into the lungs every 6 (six) hours as needed for wheezing or shortness of breath.   atorvastatin 20 MG tablet Commonly known as: LIPITOR Take 1 tablet (20 mg total) by mouth daily at 6 PM. What changed: when to take this   cefdinir 300 MG capsule Commonly known as: OMNICEF Take 1 capsule (300 mg total) by mouth 2 (two) times daily for 5 days.   diphenhydrAMINE 25 MG tablet Commonly known as: SOMINEX Take 25 mg by mouth at bedtime.   fenofibrate 145 MG tablet Commonly known as: TRICOR Take 1 tablet (145 mg total) by mouth daily.   Flutter Devi 1 each by Does not apply route 2 (two) times daily.   gabapentin 300 MG capsule Commonly known as: NEURONTIN 1 po BID prn. What changed:   how much to take  how to take this  when to take this  additional instructions   guaiFENesin-dextromethorphan 100-10 MG/5ML syrup Commonly known as: ROBITUSSIN DM Take 5 mLs by mouth every 4 (four) hours as needed for cough. Replaces: Dextromethorphan-guaiFENesin 20-400 MG Tabs   ibuprofen 100 MG tablet Commonly known as: ADVIL Take 100 mg by mouth every 6 (six) hours as needed for pain.   olmesartan-hydrochlorothiazide 20-12.5 MG tablet Commonly known as: BENICAR HCT Take 1 tablet by mouth daily.   ondansetron 4 MG disintegrating tablet Commonly known as: Zofran ODT Take 1 tablet (4 mg total) by mouth every 8 (eight) hours as needed for nausea or vomiting.   oxymetazoline 0.05 % nasal spray Commonly known as: AFRIN Place 1 spray into both nostrils at bedtime.   potassium chloride 10 MEQ tablet Commonly known as: KLOR-CON Take 2 tablets (20 mEq total) by mouth daily for 2 days.    prochlorperazine 5 MG tablet Commonly known as: COMPAZINE Take 1 tablet (5 mg total) by mouth every 8 (eight) hours as needed for refractory nausea / vomiting.   promethazine 25 MG suppository Commonly known as: PHENERGAN Place 1 suppository (25 mg total) rectally every 6 (six) hours as needed for nausea or vomiting.   promethazine 25 MG tablet Commonly known as: PHENERGAN Take 1 tablet (25 mg total) by mouth every 6 (six) hours as needed for nausea or vomiting.       Allergies  Allergen Reactions  . Dilaudid [Hydromorphone Hcl] Itching  . Morphine And Related Nausea And Vomiting    Consultations:  None  Procedures/Studies: CT ABDOMEN PELVIS W CONTRAST  Result Date: 01/03/2021 CLINICAL DATA:  75 year old female with concern for bowel obstruction. EXAM: CT ABDOMEN AND PELVIS WITH CONTRAST TECHNIQUE: Multidetector CT imaging of the abdomen and pelvis was performed using the standard protocol following bolus administration of intravenous contrast. CONTRAST:  165mL OMNIPAQUE IOHEXOL 300 MG/ML  SOLN COMPARISON:  CT abdomen pelvis dated 08/13/2020. FINDINGS: Lower chest: Similar appearance of a small right lung base subpleural nodule, likely scarring, stable since 10/07/2019. The visualized lung bases are otherwise clear. No intra-abdominal free air or free fluid. Hepatobiliary: The liver  is unremarkable. No intrahepatic biliary ductal dilatation. The gallbladder is unremarkable. Pancreas: No active inflammation. No gland atrophy or ductal dilatation. Subcentimeter hypodense lesion from the anterior proximal body of the pancreas, similar to the studies dating back to 10/07/2019, likely a benign or indolent process such as a small side branch IPMN. Attention on follow-up imaging recommended. Spleen: Normal in size without focal abnormality. Adrenals/Urinary Tract: The adrenal glands unremarkable. There is no hydronephrosis on either side. There is symmetric enhancement and excretion of  contrast by both kidneys. The visualized ureters and urinary bladder appear unremarkable. Stomach/Bowel: There is no bowel obstruction or active inflammation. The appendix is normal. Vascular/Lymphatic: Mild aortoiliac atherosclerotic disease. The IVC is unremarkable. No portal venous gas. There is no adenopathy. Reproductive: Hysterectomy.  No adnexal masses. Other: Partially visualized right breast implant. Musculoskeletal: Degenerative changes of the spine and scoliosis. No acute osseous pathology. IMPRESSION: 1. No acute intra-abdominal or pelvic pathology. No bowel obstruction. Normal appendix. 2. Aortic Atherosclerosis (ICD10-I70.0). Electronically Signed   By: Anner Crete M.D.   On: 01/03/2021 22:38   CT CHEST ABDOMEN PELVIS W CONTRAST  Result Date: 01/27/2021 CLINICAL DATA:  Pt c/o of N/V and cough for greater than one week - but is not sure how long.Took meds for nausea last nightHas not taken OTC meds for cough"butt hurts" - but not sure why "I just feel sick" Is prior PET-CT from 02/15/2020 indicates a history of a pancreatic neuroendocrine tumor. EXAM: CT CHEST, ABDOMEN, AND PELVIS WITH CONTRAST TECHNIQUE: Multidetector CT imaging of the chest, abdomen and pelvis was performed following the standard protocol during bolus administration of intravenous contrast. CONTRAST:  151mL OMNIPAQUE IOHEXOL 300 MG/ML  SOLN COMPARISON:  Prior abdomen and pelvis CTs, most recent 01/03/2021. PET-CT, 02/15/2020. FINDINGS: CT CHEST FINDINGS Cardiovascular: Heart normal in size and configuration. Left and right coronary artery calcifications. No pericardial effusion. Minor aortic atherosclerosis. Great vessels are normal in caliber. Mediastinum/Nodes: Enlarged left thyroid lobe stable from the prior PET-CT. Questionable nodule measuring 2.5 cm. No neck base, axillary, mediastinal or hilar masses or enlarged lymph nodes. Trachea and esophagus are unremarkable. Lungs/Pleura: 9 mm pleural base nodule, lateral right  lower lobe, image 110, series 4, stable from the prior PET-CT. Peripheral linear and reticular opacities noted on the right, most evident in the lateral right lower and middle lobes, consistent with scarring and/or atelectasis, similar to the prior PET-CT. Reticulonodular type opacities are noted in the anterior left upper lobe centered on image 58, series 4, minimally in the posterior aspect of the right upper lobe, image 32, new since the prior exam. Remainder of the lungs is clear. No pleural effusion or pneumothorax. Musculoskeletal: Multiple old right-sided rib fractures. Left shoulder arthroplasty. Arthropathic changes of the right shoulder. No acute fracture. No bone lesion. CT ABDOMEN PELVIS FINDINGS Hepatobiliary: No focal liver abnormality is seen. No gallstones, gallbladder wall thickening, or biliary dilatation. Pancreas: Focal enhancing lesion measuring 9 mm, posterior body/tail the pancreas, stable. No other defined mass or lesion. No inflammation or duct dilation. Spleen: Normal in size without focal abnormality. Adrenals/Urinary Tract: Adrenal glands are unremarkable. Kidneys are normal, without renal calculi, focal lesion, or hydronephrosis. Bladder is unremarkable. Stomach/Bowel: Stomach is within normal limits. Appendix appears normal. No evidence of bowel wall thickening, distention, or inflammatory changes. Vascular/Lymphatic: Mild aortic atherosclerosis. No aneurysm. No enlarged lymph nodes. Reproductive: Status post hysterectomy. No adnexal masses. Other: No abdominal wall hernia or abnormality. No abdominopelvic ascites. Musculoskeletal: No fracture or acute finding. No bone lesion.  Significant degenerative changes of the lumbar spine including dextroscoliosis. IMPRESSION: 1. Small reticulonodular opacities in the left upper lobe, minimally right upper lobe, new since the prior PET-CT. Atypical infection should be considered. 2. No other evidence of an acute abnormality within the chest,  abdomen or pelvis. 3. Possible 2.5 cm thyroid nodule. Recommend thyroid US (ref: J Am Coll Radiol. 2015 Feb;12(2): 143-50). 4. 9 mm nodular opacity at the lateral right lung base stable from the prior PET-CT. No specific follow-up recommended. 5. 9 mm hypervascular well-defined mass in the body/tail the pancreas stable from the prior PET-CT consistent with a well differentiated neuroendocrine tumor. 6. Mild aortic atherosclerosis. Electronically Signed   By: Lajean Manes M.D.   On: 01/27/2021 10:36   DG Chest Port 1 View  Result Date: 01/27/2021 CLINICAL DATA:  Cough. EXAM: PORTABLE CHEST 1 VIEW COMPARISON:  February 12, 2020. FINDINGS: The heart size and mediastinal contours are within normal limits. Both lungs are clear. No pneumothorax or pleural effusion is noted. Old right rib fractures are noted. IMPRESSION: No active disease. Electronically Signed   By: Marijo Conception M.D.   On: 01/27/2021 09:25   US THYROID  Result Date: 01/28/2021 CLINICAL DATA:  Thyroid nodule EXAM: THYROID ULTRASOUND TECHNIQUE: Ultrasound examination of the thyroid gland and adjacent soft tissues was performed. COMPARISON:  CT chest, abdomen, and pelvis 01/27/2021 FINDINGS: Parenchymal Echotexture: Mildly heterogeneous Isthmus: 0.2 cm Right lobe: 4.2 x 1.1 x 1.3 cm Left lobe: 5.2 x 2.4 x 2.2 cm _________________________________________________________ Estimated total number of nodules >/= 1 cm: 2 Number of spongiform nodules >/=  2 cm not described below (TR1): 0 Number of mixed cystic and solid nodules >/= 1.5 cm not described below ([place]): 0 _________________________________________________________ Nodule # 4: Location: Left; inferior Maximum size: 3.0 cm; Other 2 dimensions: 2.9 x 1.8 cm Composition: solid/almost completely solid (2) Echogenicity: isoechoic (1) Shape: not taller-than-wide (0) Margins: smooth (0) Echogenic foci: none (0) ACR TI-RADS total points: 3. ACR TI-RADS risk category: TR3 (3 points). ACR TI-RADS  recommendations: **Given size (>/= 2.5 cm) and appearance, fine needle aspiration of this mildly suspicious nodule should be considered based on TI-RADS criteria. _________________________________________________________ Remaining thyroid nodules do not meet criteria for FNA or imaging follow-up. IMPRESSION: Nodule 4 (TI-RADS 3) located in the inferior left thyroid lobe, measuring 3.0 x 2.9 x 1.8 cm, meets criteria for FNA. The above is in keeping with the ACR TI-RADS recommendations - J Am Coll Radiol 2017;14:587-595. Electronically Signed   By: Miachel Roux M.D.   On: 01/28/2021 08:09    Subjective: Feels better, getting up and walking around. Advanced diet successfully. No abd pain reported. Still with cough but no shortness of breath reported. Feels ready to go home. Is fully aware of the need for further evaluation of thyroid.  Discharge Exam: Vitals:   01/30/21 0548 01/30/21 0736  BP: (!) 156/85   Pulse: 86   Resp: (!) 23   Temp: 98.1 F (36.7 C)   SpO2: 94% 95%   General: Pleasant chronically ill-appearing female in no distress Cardiovascular: RRR, S1/S2 +, no rubs, no gallops Respiratory: CTA bilaterally, no wheezing, no rhonchi Abdominal: Soft, NT, ND, bowel sounds + Extremities: No edema, no cyanosis  Labs: BNP (last 3 results) Recent Labs    01/27/21 0946  BNP 62.9   Basic Metabolic Panel: Recent Labs  Lab 01/26/21 1440 01/27/21 0835 01/27/21 0946 01/28/21 0501 01/29/21 0609 01/30/21 0442  NA 136 138  --  139 138 138  K 3.0* 3.0*  --  2.9* 3.2* 4.1  CL 103 102  --  106 103 102  CO2 21* 21*  --  21* 24 27  GLUCOSE 134* 149*  --  121* 134* 140*  BUN 16 22  --  18 6* <5*  CREATININE 0.87 0.73  --  0.62 0.57 0.60  CALCIUM 9.5 9.6  --  8.9 8.8* 9.1  MG  --   --  1.9  --   --  2.0  PHOS  --   --  2.8  --   --   --    Liver Function Tests: Recent Labs  Lab 01/26/21 1440 01/27/21 0835 01/28/21 0501 01/29/21 0609  AST 16 20 19 20   ALT 14 16 13 13   ALKPHOS  92 94 70 68  BILITOT 0.5 0.7 0.9 0.6  PROT 7.2 7.7 6.3* 6.6  ALBUMIN 4.0 4.2 3.5 3.7   Recent Labs  Lab 01/27/21 0946  LIPASE 25   No results for input(s): AMMONIA in the last 168 hours. CBC: Recent Labs  Lab 01/26/21 1440 01/27/21 0835 01/28/21 0501 01/29/21 0609  WBC 11.1* 9.8 8.8 7.9  NEUTROABS 8.0* 5.7  --   --   HGB 14.1 14.0 10.9* 11.2*  HCT 41.4 40.1 32.6* 33.9*  MCV 98.1 94.4 98.2 98.0  PLT 428* 429* 324 345   Cardiac Enzymes: Recent Labs  Lab 01/27/21 0946  CKTOTAL 293*   BNP: Invalid input(s): POCBNP CBG: No results for input(s): GLUCAP in the last 168 hours. D-Dimer No results for input(s): DDIMER in the last 72 hours. Hgb A1c No results for input(s): HGBA1C in the last 72 hours. Lipid Profile No results for input(s): CHOL, HDL, LDLCALC, TRIG, CHOLHDL, LDLDIRECT in the last 72 hours. Thyroid function studies Recent Labs    01/27/21 1758  T3FREE 3.7   Anemia work up No results for input(s): VITAMINB12, FOLATE, FERRITIN, TIBC, IRON, RETICCTPCT in the last 72 hours. Urinalysis    Component Value Date/Time   COLORURINE YELLOW 01/03/2021 1924   APPEARANCEUR CLEAR 01/03/2021 1924   LABSPEC 1.020 01/03/2021 1924   PHURINE 6.0 01/03/2021 1924   GLUCOSEU NEGATIVE 01/03/2021 1924   HGBUR NEGATIVE 01/03/2021 1924   BILIRUBINUR SMALL (A) 01/03/2021 1924   BILIRUBINUR negative 12/31/2016 [place] 01/03/2021 1924   PROTEINUR NEGATIVE 01/03/2021 1924   UROBILINOGEN 0.2 12/31/2016 1145   UROBILINOGEN 0.2 03/14/2012 0612   NITRITE NEGATIVE 01/03/2021 1924   LEUKOCYTESUR NEGATIVE 01/03/2021 1924    Microbiology Recent Results (from the past 240 hour(s))  Resp Panel by RT-PCR (Flu A&B, Covid) Nasopharyngeal Swab     Status: None   Collection Time: 01/27/21  9:30 AM   Specimen: Nasopharyngeal Swab; Nasopharyngeal(NP) swabs in vial transport medium  Result Value Ref Range Status   SARS Coronavirus 2 by RT PCR NEGATIVE NEGATIVE Final     Comment: (NOTE) SARS-CoV-2 target nucleic acids are NOT DETECTED.  The SARS-CoV-2 RNA is generally detectable in upper respiratory specimens during the acute phase of infection. The lowest concentration of SARS-CoV-2 viral copies this assay can detect is 138 copies/mL. A negative result does not preclude SARS-Cov-2 infection and should not be used as the sole basis for treatment or other patient management decisions. A negative result may occur with  improper specimen collection/handling, submission of specimen other than nasopharyngeal swab, presence of viral mutation(s) within the areas targeted by this assay, and inadequate number of viral copies(<138 copies/mL). A negative result must be combined  with clinical observations, patient history, and epidemiological information. The expected result is Negative.  Fact Sheet for Patients:  EntrepreneurPulse.com.au  Fact Sheet for Healthcare Providers:  IncredibleEmployment.be  This test is no t yet approved or cleared by the [place] FDA and  has been authorized for detection and/or diagnosis of SARS-CoV-2 by FDA under an Emergency Use Authorization (EUA). This EUA will remain  in effect (meaning this test can be used) for the duration of the COVID-19 declaration under Section 564(b)(1) of the Act, 21 U.S.C.section 360bbb-3(b)(1), unless the authorization is terminated  or revoked sooner.       Influenza A by PCR NEGATIVE NEGATIVE Final   Influenza B by PCR NEGATIVE NEGATIVE Final    Comment: (NOTE) The Xpert Xpress SARS-CoV-2/FLU/RSV plus assay is intended as an aid in the diagnosis of influenza from Nasopharyngeal swab specimens and should not be used as a sole basis for treatment. Nasal washings and aspirates are unacceptable for Xpert Xpress SARS-CoV-2/FLU/RSV testing.  Fact Sheet for Patients: EntrepreneurPulse.com.au  Fact Sheet for Healthcare  Providers: IncredibleEmployment.be  This test is not yet approved or cleared by the [place] FDA and has been authorized for detection and/or diagnosis of SARS-CoV-2 by FDA under an Emergency Use Authorization (EUA). This EUA will remain in effect (meaning this test can be used) for the duration of the COVID-19 declaration under Section 564(b)(1) of the Act, 21 U.S.C. section 360bbb-3(b)(1), unless the authorization is terminated or revoked.  Performed at [facility], [address] [place], [place] [address]     Time coordinating discharge: Approximately 40 minutes  Patrecia Pour, MD  [facility] 01/30/2021, 10:29 AM

## 2021-01-30 NOTE — Progress Notes (Signed)
AVS reviewed with patient. All new medications and follow up appointments discussed with patient. Patient voiced understanding. IV access removed. Dressing applied.  Pt's grandson will transport home via private vehicle. Awaiting transport.

## 2021-01-30 NOTE — TOC Transition Note (Signed)
Transition of Care [facility]) - CM/SW Discharge Note   Patient Details  Name: [name] MRN: [id number] Date of Birth: 01-20-1946  [facility] CM/SW Contact:  Trish Mage, LCSW Phone Number: 01/30/2021, 10:39 AM   Clinical Narrative:  Patient seen in follow up to PT recommendation of [facility], DME RW.  [name] states she lives at home with grandson who helps her out at home, supports himself by giving plasma so he is generally at home.  She declines HH PT, saying she is fine without it.  DME RW also recommeded; today when PT saw her she told them she already had one at home and promised to use it.  [name] states her grandson will pick her up today for transportation home. No further needs identified. TOC will continue to follow during the course of hospitalization.      Final next level of care: Home/Self Care Barriers to Discharge: No Barriers Identified   Patient Goals and CMS Choice        Discharge Placement                       Discharge Plan and Services                                     Social Determinants of Health (SDOH) Interventions     Readmission Risk Interventions No flowsheet data found.

## 2021-01-30 NOTE — Progress Notes (Signed)
Initial Nutrition Assessment  DOCUMENTATION CODES:   Not applicable  INTERVENTION:  Ensure Enlive po BID, each supplement provides 350 kcal and 20 grams of protein  Magic cup BID with meals, each supplement provides 290 kcal and 9 grams of protein  MVI with minerals daily   NUTRITION DIAGNOSIS:   Inadequate oral intake related to nausea as evidenced by per patient/family report.    GOAL:   Patient will meet greater than or equal to 90% of their needs    MONITOR:   PO intake,Supplement acceptance,Diet advancement,Weight trends,Labs,I & O's  REASON FOR ASSESSMENT:   Malnutrition Screening Tool    ASSESSMENT:   Pt admitted with bilateral upper lobe PNA and N/V. PMH includes HLD, neuroendocrine tumor.  Pt endorses intractable N/V that began ~2 weeks ago, though states this is somewhat improved with antiemetics. Per RN, pt was able to tolerate a clear liquid diet and was upgraded to full liquids but is still endorsing some abdominal pain. RN states diet will likely be advanced to soft today.   PO intake:75% x 1 recorded meal  Reviewed weight history. No significant weight changes noted.   No UOP documented.  Labs and medications reviewed. Pt receiving zofran.    Diet Order:   Diet Order            Diet full liquid Room service appropriate? Yes; Fluid consistency: Thin  Diet effective now                 EDUCATION NEEDS:   No education needs have been identified at this time  Skin:  Skin Assessment: Reviewed RN Assessment  Last BM:  4/17  Height:   Ht Readings from Last 1 Encounters:  01/27/21 5\' 6"  (1.676 m)    Weight:   Wt Readings from Last 1 Encounters:  01/27/21 75.6 kg    BMI:  Body mass index is 26.9 kg/m.  Estimated Nutritional Needs:   Kcal:  1700-1900  Protein:  85-95 grams  Fluid:  >1.7L/d    Larkin Ina, MS, RD, LDN RD pager number and weekend/on-call pager number located in [place].

## 2021-02-10 ENCOUNTER — Ambulatory Visit: Payer: Self-pay | Admitting: Family Medicine

## 2021-02-14 NOTE — ED Provider Notes (Signed)
[facility] Provider Note   CSN: [id number] Arrival date & time: 01/27/21  0815     History Chief Complaint  Patient presents with  . Emesis    [name] is a 75 y.o. female.  HPI Patient has been having ongoing cough productive of white phlegm with generalized weakness.  She has felt short of breath.  She was seen the previous day in ED triage but did not complete care at that time and now presents to [facility].  Symptoms are worsening.  Now she is also having nausea and vomiting.  URI symptoms have been present for about a week.  She has had her COVID-vaccine and booster.    Past Medical History:  Diagnosis Date  . Arthritis   . Asthma   . 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 Complication of anesthesia   . GERD (gastroesophageal reflux disease)    at times  . Hyperlipidemia   . Hypertension   . Peripheral vascular disease ([place])    has spider veins  . Pneumonia   . Pneumothorax, traumatic    RIGHT; associated with multiple rib fractures after a fall  . PONV (postoperative nausea and vomiting)   . Pre-diabetes     Patient Active Problem List   Diagnosis Date Noted  . Intractable nausea and vomiting 01/27/2021  . Prediabetes 02/14/2020  . Essential hypertension 08/19/2017  . Hyperlipidemia 08/19/2017  . Cough variant asthma 04/04/2016  . Upper airway cough syndrome 04/04/2016  . 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 ABDOMINAL HYSTERECTOMY  1982  . AUGMENTATION MAMMAPLASTY Bilateral    Patient has bilateral implants   . 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 BREAST SURGERY    . ESOPHAGOGASTRODUODENOSCOPY (EGD) WITH PROPOFOL N/A 01/05/2020   Procedure: ESOPHAGOGASTRODUODENOSCOPY (EGD) WITH PROPOFOL;  Surgeon: Arta Silence, MD;  Location: [facility];  Service: Endoscopy;  Laterality: N/A;  . EUS N/A 01/05/2020   Procedure: UPPER ENDOSCOPIC ULTRASOUND (EUS) LINEAR;  Surgeon:  Arta Silence, MD;  Location: [facility];  Service: Endoscopy;  Laterality: N/A;  . FINE NEEDLE ASPIRATION N/A 01/05/2020   Procedure: FINE NEEDLE ASPIRATION (FNA) LINEAR;  Surgeon: Arta Silence, MD;  Location: [facility];  Service: Endoscopy;  Laterality: N/A;  . JOINT REPLACEMENT  2009   right knee  . JOINT REPLACEMENT  2011   left knee  . MASTECTOMY  2008   right breast  . REDUCTION MAMMAPLASTY Left    Patient had lift with implant on Left side   . SHOULDER ARTHROSCOPY DISTAL CLAVICLE EXCISION AND OPEN ROTATOR CUFF REPAIR  2011  . TOTAL SHOULDER ARTHROPLASTY  03/14/2012   Procedure: TOTAL SHOULDER ARTHROPLASTY;  Surgeon: Alta Corning, MD;  Location: [facility];  Service: Orthopedics;  Laterality: Left;     OB History   No obstetric history on file.     Family History  Problem Relation Age of Onset  . Breast cancer Sister   . Rheum arthritis Sister   . Breast cancer Paternal Grandmother   . Kidney Stones Mother   . CVA Mother   . Hypertension Father   . Anesthesia problems Neg Hx   . Hypotension Neg Hx   . Malignant hyperthermia Neg Hx   . Pseudochol deficiency Neg Hx     Social History   Tobacco Use  . Smoking status: Never Smoker  . Smokeless tobacco: Never Used  Vaping  Use  . Vaping Use: Never used  Substance Use Topics  . Alcohol use: No    Alcohol/week: 0.0 standard drinks  . Drug use: No    Home Medications Prior to Admission medications   Medication Sig Start Date End Date Taking? Authorizing Provider  acetaminophen (TYLENOL) 325 MG tablet Take 650 mg by mouth every 6 (six) hours as needed for mild pain or moderate pain.    Yes [provider]  albuterol (PROAIR HFA) 108 (90 Base) MCG/ACT inhaler Inhale 2 puffs into the lungs every 6 (six) hours as needed for wheezing or shortness of breath. 08/12/20  Yes [name], MD  atorvastatin (LIPITOR) 20 MG tablet Take 1 tablet (20 mg total) by mouth daily at 6 PM. Patient taking differently:  Take 20 mg by mouth daily. 08/12/20  Yes [name], MD  diphenhydrAMINE (SOMINEX) 25 MG tablet Take 25 mg by mouth at bedtime.   Yes [provider]  fenofibrate (TRICOR) 145 MG tablet Take 1 tablet (145 mg total) by mouth daily. 08/12/20  Yes [name], MD  fluticasone-salmeterol (ADVAIR HFA) [phone] MCG/ACT inhaler Inhale 2 puffs into the lungs 2 (two) times daily. 08/12/20  Yes [name], MD  gabapentin (NEURONTIN) 300 MG capsule 1 po BID prn. Patient taking differently: Take 300 mg by mouth 3 (three) times daily. 07/01/19  Yes [name], MD  ibuprofen (ADVIL,MOTRIN) 100 MG tablet Take 100 mg by mouth every 6 (six) hours as needed for pain.    Yes [provider]  olmesartan-hydrochlorothiazide (BENICAR HCT) 20-12.5 MG tablet Take 1 tablet by mouth daily. 08/12/20  Yes [name], MD  oxymetazoline (AFRIN) 0.05 % nasal spray Place 1 spray into both nostrils at bedtime.   Yes [provider]  prochlorperazine (COMPAZINE) 5 MG tablet Take 1 tablet (5 mg total) by mouth every 8 (eight) hours as needed for refractory nausea / vomiting. 01/30/21  Yes Patrecia Pour, MD  promethazine (PHENERGAN) 25 MG suppository Place 1 suppository (25 mg total) rectally every 6 (six) hours as needed for nausea or vomiting. 01/06/21  Yes Tacy Learn, PA-C  promethazine (PHENERGAN) 25 MG tablet Take 1 tablet (25 mg total) by mouth every 6 (six) hours as needed for nausea or vomiting. 01/06/21  Yes Tacy Learn, PA-C  guaiFENesin-dextromethorphan (ROBITUSSIN DM) 100-10 MG/5ML syrup Take 5 mLs by mouth every 4 (four) hours as needed for cough. 01/30/21   Patrecia Pour, MD  ondansetron (ZOFRAN ODT) 4 MG disintegrating tablet Take 1 tablet (4 mg total) by mouth every 8 (eight) hours as needed for nausea or vomiting. 01/30/21   Patrecia Pour, MD  potassium chloride (KLOR-CON) 10 MEQ tablet Take 2 tablets (20 mEq total) by mouth daily for 2 days. Patient not  taking: Reported on 01/27/2021 01/03/21 01/05/21  Deliah Boston, PA-C  Respiratory Therapy Supplies (FLUTTER) DEVI 1 each by Does not apply route 2 (two) times daily. 07/15/19   Julian Hy, DO  potassium chloride SA (KLOR-CON) 20 MEQ tablet Take 1 tablet (20 mEq total) by mouth 2 (two) times daily. 45/80/99 8/33/82  Delora Fuel, MD    Allergies    Dilaudid [hydromorphone hcl] and Morphine and related  Review of Systems   Review of Systems 10 systems reviewed and negative except as per HPI Physical Exam Updated Vital Signs BP (!) 156/85 (BP Location: Right Arm)   Pulse 86   Temp 98.1 F (36.7 C) (Oral)  Resp (!) 23   Ht 5\' 6"  (1.676 m)   Wt 75.6 kg   SpO2 95%   BMI 26.90 kg/m   Physical Exam Constitutional:      Appearance: She is well-developed.  HENT:     Head: Normocephalic and atraumatic.  Cardiovascular:     Rate and Rhythm: Normal rate and regular rhythm.     Heart sounds: Normal heart sounds.  Pulmonary:     Effort: Pulmonary effort is normal.     Breath sounds: Normal breath sounds.  Abdominal:     General: Bowel sounds are normal. There is no distension.     Palpations: Abdomen is soft.     Tenderness: There is no abdominal tenderness.  Musculoskeletal:        General: Normal range of motion.     Cervical back: Neck supple.  Skin:    General: Skin is warm and dry.  Neurological:     Mental Status: She is alert and oriented to person, place, and time.     GCS: GCS eye subscore is 4. GCS verbal subscore is 5. GCS motor subscore is 6.     Coordination: Coordination normal.     ED Results / Procedures / Treatments   Labs (all labs ordered are listed, but only abnormal results are displayed) Labs Reviewed  CBC WITH DIFFERENTIAL/PLATELET - Abnormal; Notable for the following components:      Result Value   Platelets 429 (*)    All other components within normal limits  COMPREHENSIVE METABOLIC PANEL - Abnormal; Notable for the following components:    Potassium 3.0 (*)    CO2 21 (*)    Glucose, Bld 149 (*)    All other components within normal limits  CK - Abnormal; Notable for the following components:   Total CK 293 (*)    All other components within normal limits  TSH - Abnormal; Notable for the following components:   TSH 0.208 (*)    All other components within normal limits  T4, FREE - Abnormal; Notable for the following components:   Free T4 1.41 (*)    All other components within normal limits  COMPREHENSIVE METABOLIC PANEL - Abnormal; Notable for the following components:   Potassium 2.9 (*)    CO2 21 (*)    Glucose, Bld 121 (*)    Total Protein 6.3 (*)    All other components within normal limits  CBC - Abnormal; Notable for the following components:   RBC 3.32 (*)    Hemoglobin 10.9 (*)    HCT 32.6 (*)    All other components within normal limits  COMPREHENSIVE METABOLIC PANEL - Abnormal; Notable for the following components:   Potassium 3.2 (*)    Glucose, Bld 134 (*)    BUN 6 (*)    Calcium 8.8 (*)    All other components within normal limits  CBC - Abnormal; Notable for the following components:   RBC 3.46 (*)    Hemoglobin 11.2 (*)    HCT 33.9 (*)    All other components within normal limits  BASIC METABOLIC PANEL - Abnormal; Notable for the following components:   Glucose, Bld 140 (*)    BUN <5 (*)    All other components within normal limits  RESP PANEL BY RT-PCR (FLU A&B, COVID) ARPGX2  LIPASE, BLOOD  LACTIC ACID, PLASMA  PROTIME-INR  MAGNESIUM  PHOSPHORUS  BRAIN NATRIURETIC PEPTIDE  PROCALCITONIN  PROCALCITONIN  T3, FREE  PROCALCITONIN  MAGNESIUM  TROPONIN I (HIGH SENSITIVITY)  TROPONIN I (HIGH SENSITIVITY)    EKG None  Radiology No results found.  Procedures Procedures   Medications Ordered in ED Medications  ondansetron (ZOFRAN) injection 4 mg (4 mg Intravenous Given 01/27/21 0834)  lactated ringers bolus 1,000 mL (0 mLs Intravenous Stopped 01/27/21 1206)  potassium chloride  10 mEq in 100 mL IVPB (0 mEq Intravenous Stopped 01/27/21 1205)  pantoprazole (PROTONIX) injection 40 mg (40 mg Intravenous Given 01/27/21 0922)  iohexol (OMNIPAQUE) 300 MG/ML solution 100 mL (100 mLs Intravenous Contrast Given 01/27/21 1010)  cefTRIAXone (ROCEPHIN) 1 g in sodium chloride 0.9 % 100 mL IVPB (0 g Intravenous Stopped 01/27/21 1234)  azithromycin (ZITHROMAX) 500 mg in sodium chloride 0.9 % 250 mL IVPB (0 mg Intravenous Stopped 01/27/21 1334)  ipratropium-albuterol (DUONEB) 0.5-2.5 (3) MG/3ML nebulizer solution 3 mL (3 mLs Nebulization Given 01/27/21 1143)  benzonatate (TESSALON) capsule 200 mg (200 mg Oral Given 01/27/21 1216)  LORazepam (ATIVAN) injection 1 mg (1 mg Intravenous Given 01/27/21 1305)  oxymetazoline (AFRIN) 0.05 % nasal spray 1 spray (1 spray Each Nare Given 01/29/21 2208)  ketorolac (TORADOL) 15 MG/ML injection 15 mg (15 mg Intravenous Given 01/27/21 2159)  ketorolac (TORADOL) 15 MG/ML injection 15 mg (15 mg Intravenous Given 01/28/21 0520)  magnesium sulfate IVPB 2 g 50 mL (2 g Intravenous New Bag/Given 01/28/21 1338)  traMADol (ULTRAM) tablet 50 mg (50 mg Oral Given 01/29/21 0202)  ketorolac (TORADOL) 15 MG/ML injection 15 mg (15 mg Intravenous Given 01/29/21 0745)  gabapentin (NEURONTIN) capsule 200 mg (200 mg Oral Given 01/29/21 0744)  ketorolac (TORADOL) 15 MG/ML injection 15 mg (15 mg Intravenous Given 01/30/21 0436)    ED Course  I have reviewed the triage vital signs and the nursing notes.  Pertinent labs & imaging results that were available during my care of the patient were reviewed by me and considered in my medical decision making (see chart for details).    MDM Rules/Calculators/A&P                         Patient presents with symptoms of progressive cough, malaise and shortness of breath.  CT scan shows some infiltrates bilateral upper lobes.  We will start treatment for community-acquired pneumonia.  She also reports she has been significantly nauseated and not  taking oral intake well.  She has hypokalemia.  Will proceed with volume of potassium replacement.  Plan for admission.  Consult, Dr. Marylyn Ishihara for admission. Final Clinical Impression(s) / ED Diagnoses Final diagnoses:  Community acquired pneumonia, unspecified laterality  Hypokalemia    Rx / DC Orders ED Discharge Orders         Ordered    cefdinir (OMNICEF) 300 MG capsule  2 times daily        01/30/21 1029    ondansetron (ZOFRAN ODT) 4 MG disintegrating tablet  Every 8 hours PRN        01/30/21 1029    guaiFENesin-dextromethorphan (ROBITUSSIN DM) 100-10 MG/5ML syrup  Every 4 hours PRN        01/30/21 1029    prochlorperazine (COMPAZINE) 5 MG tablet  Every 8 hours PRN        01/30/21 1029    Increase activity slowly        01/30/21 1029    Diet - low sodium heart healthy        01/30/21 1029    Discharge instructions       Comments: You were  admitted for pneumonia and significant nausea and vomiting. With antibiotics and medications for nausea you have improved. You are stable for discharge with the following recommendations:  - Advance your diet slowly, taking zofran as needed for nausea or vomiting. If this is ineffective, you may take compazine (both sent to your pharmacy) - Continue taking antibiotics (cefdinir) twice daily for 5 more days to complete pneumonia treatment. You may also take cough medication as prescribed as needed.  - It is VERY IMPORTANT to follow up with Dr. Carlota Raspberry regarding your thyroid. The thyroid tests were mildly abnormal and there is a nodule on the thyroid that will need to have a fine needle aspirate (FNA) performed for testing.  - Seek medical attention if your develop shortness of breath or chest pain or are unable to tolerate water/medications.   01/30/21 [place], MD 02/14/21 [phone]

## 2021-02-23 ENCOUNTER — Ambulatory Visit: Payer: Medicare Other | Admitting: Family Medicine

## 2021-03-02 DIAGNOSIS — Z23 Encounter for immunization: Secondary | ICD-10-CM | POA: Diagnosis not present

## 2021-04-10 ENCOUNTER — Other Ambulatory Visit: Payer: Self-pay

## 2021-04-10 ENCOUNTER — Emergency Department (HOSPITAL_BASED_OUTPATIENT_CLINIC_OR_DEPARTMENT_OTHER): Payer: Medicare Other

## 2021-04-10 ENCOUNTER — Emergency Department (HOSPITAL_BASED_OUTPATIENT_CLINIC_OR_DEPARTMENT_OTHER)
Admission: EM | Admit: 2021-04-10 | Discharge: 2021-04-10 | Disposition: A | Discharge status: Home / Self Care | Payer: Medicare Other | Source: Home / Self Care | Attending: Emergency Medicine | Admitting: Emergency Medicine

## 2021-04-10 ENCOUNTER — Encounter (HOSPITAL_BASED_OUTPATIENT_CLINIC_OR_DEPARTMENT_OTHER): Payer: Self-pay | Admitting: Emergency Medicine

## 2021-04-10 DIAGNOSIS — E876 Hypokalemia: Secondary | ICD-10-CM

## 2021-04-10 DIAGNOSIS — Z96612 Presence of left artificial shoulder joint: Secondary | ICD-10-CM | POA: Insufficient documentation

## 2021-04-10 DIAGNOSIS — J323 Chronic sphenoidal sinusitis: Secondary | ICD-10-CM | POA: Diagnosis not present

## 2021-04-10 DIAGNOSIS — Z8249 Family history of ischemic heart disease and other diseases of the circulatory system: Secondary | ICD-10-CM | POA: Diagnosis not present

## 2021-04-10 DIAGNOSIS — Z888 Allergy status to other drugs, medicaments and biological substances status: Secondary | ICD-10-CM | POA: Diagnosis not present

## 2021-04-10 DIAGNOSIS — Z9011 Acquired absence of right breast and nipple: Secondary | ICD-10-CM | POA: Diagnosis not present

## 2021-04-10 DIAGNOSIS — R0989 Other specified symptoms and signs involving the circulatory and respiratory systems: Secondary | ICD-10-CM

## 2021-04-10 DIAGNOSIS — J45909 Unspecified asthma, uncomplicated: Secondary | ICD-10-CM | POA: Diagnosis not present

## 2021-04-10 DIAGNOSIS — Z7952 Long term (current) use of systemic steroids: Secondary | ICD-10-CM | POA: Diagnosis not present

## 2021-04-10 DIAGNOSIS — I6782 Cerebral ischemia: Secondary | ICD-10-CM | POA: Diagnosis not present

## 2021-04-10 DIAGNOSIS — Z20822 Contact with and (suspected) exposure to covid-19: Secondary | ICD-10-CM | POA: Diagnosis not present

## 2021-04-10 DIAGNOSIS — R7303 Prediabetes: Secondary | ICD-10-CM | POA: Diagnosis present

## 2021-04-10 DIAGNOSIS — R410 Disorientation, unspecified: Secondary | ICD-10-CM | POA: Diagnosis not present

## 2021-04-10 DIAGNOSIS — R059 Cough, unspecified: Secondary | ICD-10-CM | POA: Diagnosis not present

## 2021-04-10 DIAGNOSIS — D3A8 Other benign neuroendocrine tumors: Secondary | ICD-10-CM | POA: Diagnosis present

## 2021-04-10 DIAGNOSIS — E860 Dehydration: Secondary | ICD-10-CM | POA: Diagnosis not present

## 2021-04-10 DIAGNOSIS — R112 Nausea with vomiting, unspecified: Secondary | ICD-10-CM

## 2021-04-10 DIAGNOSIS — G319 Degenerative disease of nervous system, unspecified: Secondary | ICD-10-CM | POA: Diagnosis not present

## 2021-04-10 DIAGNOSIS — I10 Essential (primary) hypertension: Secondary | ICD-10-CM | POA: Diagnosis not present

## 2021-04-10 DIAGNOSIS — S2231XA Fracture of one rib, right side, initial encounter for closed fracture: Secondary | ICD-10-CM | POA: Diagnosis not present

## 2021-04-10 DIAGNOSIS — G9341 Metabolic encephalopathy: Secondary | ICD-10-CM | POA: Diagnosis not present

## 2021-04-10 DIAGNOSIS — Z8507 Personal history of malignant neoplasm of pancreas: Secondary | ICD-10-CM | POA: Insufficient documentation

## 2021-04-10 DIAGNOSIS — Z8261 Family history of arthritis: Secondary | ICD-10-CM | POA: Diagnosis not present

## 2021-04-10 DIAGNOSIS — Z96653 Presence of artificial knee joint, bilateral: Secondary | ICD-10-CM | POA: Insufficient documentation

## 2021-04-10 DIAGNOSIS — I739 Peripheral vascular disease, unspecified: Secondary | ICD-10-CM | POA: Diagnosis not present

## 2021-04-10 DIAGNOSIS — Z823 Family history of stroke: Secondary | ICD-10-CM | POA: Diagnosis not present

## 2021-04-10 DIAGNOSIS — K219 Gastro-esophageal reflux disease without esophagitis: Secondary | ICD-10-CM | POA: Diagnosis not present

## 2021-04-10 DIAGNOSIS — R0981 Nasal congestion: Secondary | ICD-10-CM | POA: Insufficient documentation

## 2021-04-10 DIAGNOSIS — R0789 Other chest pain: Secondary | ICD-10-CM | POA: Diagnosis not present

## 2021-04-10 DIAGNOSIS — Z79899 Other long term (current) drug therapy: Secondary | ICD-10-CM | POA: Diagnosis not present

## 2021-04-10 DIAGNOSIS — R531 Weakness: Secondary | ICD-10-CM | POA: Diagnosis not present

## 2021-04-10 DIAGNOSIS — R000 Tachycardia, unspecified: Secondary | ICD-10-CM | POA: Diagnosis not present

## 2021-04-10 DIAGNOSIS — R1110 Vomiting, unspecified: Secondary | ICD-10-CM | POA: Diagnosis not present

## 2021-04-10 DIAGNOSIS — E785 Hyperlipidemia, unspecified: Secondary | ICD-10-CM | POA: Diagnosis present

## 2021-04-10 LAB — CBC WITH DIFFERENTIAL/PLATELET
Abs Immature Granulocytes: 0.03 10*3/uL (ref 0.00–0.07)
Basophils Absolute: 0.1 10*3/uL (ref 0.0–0.1)
Basophils Relative: 1 %
Eosinophils Absolute: 0.5 10*3/uL (ref 0.0–0.5)
Eosinophils Relative: 5 %
HCT: 39.7 % (ref 36.0–46.0)
Hemoglobin: 13.8 g/dL (ref 12.0–15.0)
Immature Granulocytes: 0 %
Lymphocytes Relative: 17 %
Lymphs Abs: 1.8 10*3/uL (ref 0.7–4.0)
MCH: 33.1 pg (ref 26.0–34.0)
MCHC: 34.8 g/dL (ref 30.0–36.0)
MCV: 95.2 fL (ref 80.0–100.0)
Monocytes Absolute: 0.6 10*3/uL (ref 0.1–1.0)
Monocytes Relative: 5 %
Neutro Abs: 7.6 10*3/uL (ref 1.7–7.7)
Neutrophils Relative %: 72 %
Platelets: 286 10*3/uL (ref 150–400)
RBC: 4.17 MIL/uL (ref 3.87–5.11)
RDW: 12.7 % (ref 11.5–15.5)
WBC: 10.6 10*3/uL — ABNORMAL HIGH (ref 4.0–10.5)
nRBC: 0 % (ref 0.0–0.2)

## 2021-04-10 LAB — TROPONIN I (HIGH SENSITIVITY)
Troponin I (High Sensitivity): 19 ng/L — ABNORMAL HIGH (ref <18)
Troponin I (High Sensitivity): 19 ng/L — ABNORMAL HIGH (ref <18)

## 2021-04-10 LAB — BASIC METABOLIC PANEL
Anion gap: 11 (ref 5–15)
BUN: 14 mg/dL (ref 8–23)
CO2: 23 mmol/L (ref 22–32)
Calcium: 9.6 mg/dL (ref 8.9–10.3)
Chloride: 103 mmol/L (ref 98–111)
Creatinine, Ser: 0.63 mg/dL (ref 0.44–1.00)
GFR, Estimated: >60 mL/min (ref >60)
Glucose, Bld: 144 mg/dL — ABNORMAL HIGH (ref 70–99)
Potassium: 3.1 mmol/L — ABNORMAL LOW (ref 3.5–5.1)
Sodium: 137 mmol/L (ref 135–145)

## 2021-04-10 LAB — RESP PANEL BY RT-PCR (FLU A&B, COVID) ARPGX2
Influenza A by PCR: NEGATIVE
Influenza B by PCR: NEGATIVE
SARS Coronavirus 2 by RT PCR: NEGATIVE

## 2021-04-10 MED ORDER — HYDRALAZINE HCL 20 MG/ML IJ SOLN
5.0000 mg | Freq: Once | INTRAMUSCULAR | Status: AC
  Administered 2021-04-10: 5 mg via INTRAVENOUS
  Filled 2021-04-10: qty 1

## 2021-04-10 MED ORDER — HALOPERIDOL LACTATE 5 MG/ML IJ SOLN
2.0000 mg | Freq: Once | INTRAMUSCULAR | Status: AC
  Administered 2021-04-10: 2 mg via INTRAVENOUS
  Filled 2021-04-10: qty 1

## 2021-04-10 MED ORDER — SODIUM CHLORIDE 0.9 % IV BOLUS
500.0000 mL | Freq: Once | INTRAVENOUS | Status: AC
  Administered 2021-04-10: 500 mL via INTRAVENOUS

## 2021-04-10 MED ORDER — ONDANSETRON HCL 4 MG/2ML IJ SOLN
4.0000 mg | Freq: Once | INTRAMUSCULAR | Status: AC
  Administered 2021-04-10: 4 mg via INTRAVENOUS
  Filled 2021-04-10: qty 2

## 2021-04-10 MED ORDER — ONDANSETRON 8 MG PO TBDP: ORAL_TABLET | ORAL | 0 refills | Status: DC

## 2021-04-10 MED ORDER — PREDNISONE 20 MG PO TABS: ORAL_TABLET | ORAL | 0 refills | Status: DC

## 2021-04-10 MED ORDER — ONDANSETRON 4 MG PO TBDP
ORAL_TABLET | ORAL | Status: AC
  Filled 2021-04-10: qty 1

## 2021-04-10 MED ORDER — DIPHENHYDRAMINE HCL 50 MG/ML IJ SOLN
INTRAMUSCULAR | Status: AC
  Filled 2021-04-10: qty 1

## 2021-04-10 MED ORDER — AMLODIPINE BESYLATE 5 MG PO TABS
5.0000 mg | ORAL_TABLET | Freq: Once | ORAL | Status: AC
  Administered 2021-04-10: 5 mg via ORAL
  Filled 2021-04-10: qty 1

## 2021-04-10 MED ORDER — POTASSIUM CHLORIDE CRYS ER 20 MEQ PO TBCR: 20.0000 meq | EXTENDED_RELEASE_TABLET | Freq: Every day | ORAL | 0 refills | Status: DC

## 2021-04-10 MED ORDER — ONDANSETRON 4 MG PO TBDP
4.0000 mg | ORAL_TABLET | Freq: Once | ORAL | Status: AC
  Administered 2021-04-10: 4 mg via ORAL

## 2021-04-10 MED ORDER — METOCLOPRAMIDE HCL 10 MG PO TABS: 10.0000 mg | ORAL_TABLET | Freq: Three times a day (TID) | ORAL | 0 refills | Status: DC | PRN

## 2021-04-10 MED ORDER — DIPHENHYDRAMINE HCL 50 MG/ML IJ SOLN
12.5000 mg | Freq: Once | INTRAMUSCULAR | Status: AC
  Administered 2021-04-10: 12.5 mg via INTRAVENOUS

## 2021-04-10 MED ORDER — ALBUTEROL SULFATE HFA 108 (90 BASE) MCG/ACT IN AERS: 2.0000 | INHALATION_SPRAY | RESPIRATORY_TRACT | 0 refills | Status: DC | PRN

## 2021-04-10 NOTE — ED Provider Notes (Signed)
[facility] EMERGENCY DEPARTMENT Provider Note   CSN: [id number] Arrival date & time: 04/10/21  0310     History Chief Complaint  Patient presents with   Vomiting    [name] is a 75 y.o. female.  The history is provided by the patient and a relative.  Illness Location:  Nose and  chest Quality:  Congestion Severity:  Moderate Onset quality:  Gradual Duration:  4 days Timing:  Constant Progression:  Unchanged Chronicity:  New Relieved by:  Nothing Worsened by:  Nothing Ineffective treatments:  Mucinex Associated symptoms: congestion   Associated symptoms: no abdominal pain, no chest pain, no cough, no diarrhea, no fever, no loss of consciousness, no rash, no rhinorrhea, no shortness of breath and no wheezing   Congestion:    Location:  Nasal and chest   Interferes with sleep: no     Interferes with eating/drinking: no   Patient with HTN who is not taking her medications presents with congestion and then developed nausea and emesis 2.5 hours ago.  Patient has had issues with emesis in the past.  Denies CP, SOB.  No diarrhea. No abdominal pain.  No headache.      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   Complication of anesthesia    GERD (gastroesophageal reflux disease)    at times   Hyperlipidemia    Hypertension    Peripheral vascular disease ([place])    has spider veins   Pneumonia    Pneumothorax, traumatic    RIGHT; associated with multiple rib fractures after a fall   PONV (postoperative nausea and vomiting)    Pre-diabetes     Patient Active Problem List   Diagnosis Date Noted   Intractable nausea and vomiting 01/27/2021   Prediabetes 02/14/2020   Essential hypertension 08/19/2017   Hyperlipidemia 08/19/2017   Cough variant asthma 04/04/2016   Upper airway cough syndrome 04/04/2016   Osteoarthritis of left shoulder 03/24/2012    Past Surgical History:  Procedure Laterality Date    ABDOMINAL HYSTERECTOMY  1982   AUGMENTATION MAMMAPLASTY Bilateral    Patient has bilateral implants    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   BREAST SURGERY     ESOPHAGOGASTRODUODENOSCOPY (EGD) WITH PROPOFOL N/A 01/05/2020   Procedure: ESOPHAGOGASTRODUODENOSCOPY (EGD) WITH PROPOFOL;  Surgeon: Arta Silence, MD;  Location: [facility];  Service: Endoscopy;  Laterality: N/A;   EUS N/A 01/05/2020   Procedure: UPPER ENDOSCOPIC ULTRASOUND (EUS) LINEAR;  Surgeon: Arta Silence, MD;  Location: [facility];  Service: Endoscopy;  Laterality: N/A;   FINE NEEDLE ASPIRATION N/A 01/05/2020   Procedure: FINE NEEDLE ASPIRATION (FNA) LINEAR;  Surgeon: Arta Silence, MD;  Location: [facility];  Service: Endoscopy;  Laterality: N/A;   JOINT REPLACEMENT  2009   right knee   JOINT REPLACEMENT  2011   left knee   MASTECTOMY  2008   right breast   REDUCTION MAMMAPLASTY Left    Patient had lift with implant on Left side    SHOULDER ARTHROSCOPY DISTAL CLAVICLE EXCISION AND OPEN ROTATOR CUFF REPAIR  2011   TOTAL SHOULDER ARTHROPLASTY  03/14/2012   Procedure: TOTAL SHOULDER ARTHROPLASTY;  Surgeon: Alta Corning, MD;  Location: [facility];  Service: Orthopedics;  Laterality: Left;     OB History   No obstetric history on file.     Family History  Problem Relation Age of Onset  Breast cancer Sister    Rheum arthritis Sister    Breast cancer Paternal Grandmother    Kidney Stones Mother    CVA Mother    Hypertension Father    Anesthesia problems Neg Hx    Hypotension Neg Hx    Malignant hyperthermia Neg Hx    Pseudochol deficiency Neg Hx     Social History   Tobacco Use   Smoking status: Never   Smokeless tobacco: Never  Vaping Use   Vaping Use: Never used  Substance Use Topics   Alcohol use: No    Alcohol/week: 0.0 standard drinks   Drug use: No    Home Medications Prior to Admission medications   Medication Sig Start Date End Date Taking? Authorizing Provider  acetaminophen  (TYLENOL) 325 MG tablet Take 650 mg by mouth every 6 (six) hours as needed for mild pain or moderate pain.     [provider]  albuterol (PROAIR HFA) 108 (90 Base) MCG/ACT inhaler Inhale 2 puffs into the lungs every 6 (six) hours as needed for wheezing or shortness of breath. 08/12/20   [name], MD  atorvastatin (LIPITOR) 20 MG tablet Take 1 tablet (20 mg total) by mouth daily at 6 PM. Patient taking differently: Take 20 mg by mouth daily. 08/12/20   [name], MD  diphenhydrAMINE (SOMINEX) 25 MG tablet Take 25 mg by mouth at bedtime.    [provider]  fenofibrate (TRICOR) 145 MG tablet Take 1 tablet (145 mg total) by mouth daily. 08/12/20   [name], MD  fluticasone-salmeterol (ADVAIR HFA) [id number] MCG/ACT inhaler Inhale 2 puffs into the lungs 2 (two) times daily. 08/12/20   [name], MD  gabapentin (NEURONTIN) 300 MG capsule 1 po BID prn. Patient taking differently: Take 300 mg by mouth 3 (three) times daily. 07/01/19   [name], MD  guaiFENesin-dextromethorphan (ROBITUSSIN DM) 100-10 MG/5ML syrup Take 5 mLs by mouth every 4 (four) hours as needed for cough. 01/30/21   Patrecia Pour, MD  ibuprofen (ADVIL,MOTRIN) 100 MG tablet Take 100 mg by mouth every 6 (six) hours as needed for pain.     [provider]  olmesartan-hydrochlorothiazide (BENICAR HCT) 20-12.5 MG tablet Take 1 tablet by mouth daily. 08/12/20   [name], MD  ondansetron (ZOFRAN ODT) 4 MG disintegrating tablet Take 1 tablet (4 mg total) by mouth every 8 (eight) hours as needed for nausea or vomiting. 01/30/21   Patrecia Pour, MD  oxymetazoline (AFRIN) 0.05 % nasal spray Place 1 spray into both nostrils at bedtime.    [provider]  potassium chloride (KLOR-CON) 10 MEQ tablet Take 2 tablets (20 mEq total) by mouth daily for 2 days. Patient not taking: Reported on 01/27/2021 01/03/21 01/05/21  Nuala Alpha A, PA-C  prochlorperazine (COMPAZINE) 5 MG  tablet Take 1 tablet (5 mg total) by mouth every 8 (eight) hours as needed for refractory nausea / vomiting. 01/30/21   Patrecia Pour, MD  promethazine (PHENERGAN) 25 MG suppository Place 1 suppository (25 mg total) rectally every 6 (six) hours as needed for nausea or vomiting. 01/06/21   Tacy Learn, PA-C  promethazine (PHENERGAN) 25 MG tablet Take 1 tablet (25 mg total) by mouth every 6 (six) hours as needed for nausea or vomiting. 01/06/21   Tacy Learn, PA-C  Respiratory Therapy Supplies (FLUTTER) DEVI 1 each by Does not apply route 2 (two) times daily. 07/15/19   Julian Hy, DO  potassium chloride SA (KLOR-CON) 20 MEQ tablet Take 1 tablet (20 mEq total) by mouth 2 (two) times daily. 27/25/36 6/44/03  Delora Fuel, MD    Allergies    Dilaudid [hydromorphone hcl] and Morphine and related  Review of Systems   Review of Systems  Constitutional:  Negative for fever.  HENT:  Positive for congestion. Negative for drooling, facial swelling and rhinorrhea.   Eyes:  Negative for redness.  Respiratory:  Negative for cough, shortness of breath and wheezing.   Cardiovascular:  Negative for chest pain, palpitations and leg swelling.  Gastrointestinal:  Negative for abdominal pain and diarrhea.  Genitourinary:  Negative for difficulty urinating.  Musculoskeletal:  Negative for neck stiffness.  Skin:  Negative for rash.  Neurological:  Negative for loss of consciousness and facial asymmetry.  Psychiatric/Behavioral:  Negative for agitation.   All other systems reviewed and are negative.  Physical Exam Updated Vital Signs BP (!) 195/89 (BP Location: Left Arm)   Pulse 84   Temp 97.8 F (36.6 C) (Oral)   Resp 18   Ht 5' 5.5" (1.664 m)   Wt 77.1 kg   SpO2 97%   BMI 27.86 kg/m   Physical Exam Vitals and nursing note reviewed.  Constitutional:      Appearance: Normal appearance. She is not ill-appearing or diaphoretic.  HENT:     Head: Normocephalic and atraumatic.     Nose: Nose  normal.  Eyes:     Conjunctiva/sclera: Conjunctivae normal.     Pupils: Pupils are equal, round, and reactive to light.  Cardiovascular:     Rate and Rhythm: Normal rate and regular rhythm.     Pulses: Normal pulses.     Heart sounds: Normal heart sounds.  Pulmonary:     Effort: Pulmonary effort is normal.     Breath sounds: Normal breath sounds.  Abdominal:     General: Abdomen is flat. Bowel sounds are normal.     Palpations: Abdomen is soft.     Tenderness: There is no abdominal tenderness. There is no guarding.  Musculoskeletal:        General: Normal range of motion.     Cervical back: Normal range of motion and neck supple.  Skin:    General: Skin is warm and dry.     Capillary Refill: Capillary refill takes less than 2 seconds.  Neurological:     General: No focal deficit present.     Mental Status: She is alert and oriented to person, place, and time.     Deep Tendon Reflexes: Reflexes normal.  Psychiatric:        Mood and Affect: Mood normal.        Behavior: Behavior normal.    ED Results / Procedures / Treatments   Labs (all labs ordered are listed, but only abnormal results are displayed) Results for orders placed or performed during the hospital encounter of 04/10/21  Resp Panel by RT-PCR (Flu A&B, Covid) Nasopharyngeal Swab   Specimen: Nasopharyngeal Swab; Nasopharyngeal(NP) swabs in vial transport medium  Result Value Ref Range   SARS Coronavirus 2 by RT PCR NEGATIVE NEGATIVE   Influenza A by PCR NEGATIVE NEGATIVE   Influenza B by PCR NEGATIVE NEGATIVE  Basic metabolic panel  Result Value Ref Range   Sodium 137 135 - 145 mmol/L   Potassium 3.1 (L) 3.5 - 5.1 mmol/L   Chloride 103 98 - 111 mmol/L   CO2 23 22 - 32 mmol/L   Glucose, Bld 144 (H) 70 -  99 mg/dL   BUN 14 8 - 23 mg/dL   Creatinine, Ser 0.63 0.44 - 1.00 mg/dL   Calcium 9.6 8.9 - 10.3 mg/dL   GFR, Estimated >60 >60 mL/min   Anion gap 11 5 - 15  CBC with Differential/Platelet  Result Value Ref  Range   WBC 10.6 (H) 4.0 - 10.5 K/uL   RBC 4.17 3.87 - 5.11 MIL/uL   Hemoglobin 13.8 12.0 - 15.0 g/dL   HCT 39.7 36.0 - 46.0 %   MCV 95.2 80.0 - 100.0 fL   MCH 33.1 26.0 - 34.0 pg   MCHC 34.8 30.0 - 36.0 g/dL   RDW 12.7 11.5 - 15.5 %   Platelets 286 150 - 400 K/uL   nRBC 0.0 0.0 - 0.2 %   Neutrophils Relative % 72 %   Neutro Abs 7.6 1.7 - 7.7 K/uL   Lymphocytes Relative 17 %   Lymphs Abs 1.8 0.7 - 4.0 K/uL   Monocytes Relative 5 %   Monocytes Absolute 0.6 0.1 - 1.0 K/uL   Eosinophils Relative 5 %   Eosinophils Absolute 0.5 0.0 - 0.5 K/uL   Basophils Relative 1 %   Basophils Absolute 0.1 0.0 - 0.1 K/uL   Immature Granulocytes 0 %   Abs Immature Granulocytes 0.03 0.00 - 0.07 K/uL  Troponin I (High Sensitivity)  Result Value Ref Range   Troponin I (High Sensitivity) 19 (H) <18 ng/L  Troponin I (High Sensitivity)  Result Value Ref Range   Troponin I (High Sensitivity) 19 (H) <18 ng/L   DG Chest Portable 1 View  Result Date: 04/10/2021 CLINICAL DATA:  Cough, vomiting EXAM: PORTABLE CHEST 1 VIEW COMPARISON:  01/27/2021 FINDINGS: Lungs are well expanded, symmetric, and clear. No pneumothorax or pleural effusion. Cardiac size within normal limits. Pulmonary vascularity is normal. Degenerative changes seen within the right shoulder. Left total shoulder arthroplasty has been performed. No acute bone abnormality. IMPRESSION: No active disease. Electronically Signed   By: Fidela Salisbury MD   On: 04/10/2021 04:04     EKG EKG Interpretation  Date/Time:  Monday April 10 2021 03:35:43 EDT Ventricular Rate:  76 PR Interval:  143 QRS Duration: 101 QT Interval:  397 QTC Calculation: 447 R Axis:   71 Text Interpretation: Sinus rhythm Confirmed by Randal Buba, Janeese Mcgloin ([id number]) on 04/10/2021 4:25:32 AM  Radiology DG Chest Portable 1 View  Result Date: 04/10/2021 CLINICAL DATA:  Cough, vomiting EXAM: PORTABLE CHEST 1 VIEW COMPARISON:  01/27/2021 FINDINGS: Lungs are well expanded, symmetric, and  clear. No pneumothorax or pleural effusion. Cardiac size within normal limits. Pulmonary vascularity is normal. Degenerative changes seen within the right shoulder. Left total shoulder arthroplasty has been performed. No acute bone abnormality. IMPRESSION: No active disease. Electronically Signed   By: Fidela Salisbury MD   On: 04/10/2021 04:04    Procedures Procedures   Medications Ordered in ED Medications  haloperidol lactate (HALDOL) injection 2 mg (has no administration in time range)  amLODipine (NORVASC) tablet 5 mg (has no administration in time range)  ondansetron (ZOFRAN-ODT) disintegrating tablet 4 mg (4 mg Oral Given 04/10/21 0338)  ondansetron (ZOFRAN) injection 4 mg (4 mg Intravenous Given 04/10/21 0402)  hydrALAZINE (APRESOLINE) injection 5 mg (5 mg Intravenous Given 04/10/21 0430)    ED Course  I have reviewed the triage vital signs and the nursing notes.  Pertinent labs & imaging results that were available during my care of the patient were reviewed by me and considered in my medical decision making (see  chart for details).   Patient vomiting through 2 doses of zofran and was given haldol.  Had mild tardive dyskinesia with lip pursing and smacking and was given benadryl with cessation.   No chest pain, troponins are flat and top normal.  This is not ACS.  Patient has stopped vomiting and is stable for discharge.  Start taking your blood pressure medication and follow up with your PMD for ongoing care.    TATEM HOLSONBACK was evaluated in Emergency Department on 04/10/2021 for the symptoms described in the history of present illness. She was evaluated in the context of the global COVID-19 pandemic, which necessitated consideration that the patient might be at risk for infection with the SARS-CoV-2 virus that causes COVID-19. Institutional protocols and algorithms that pertain to the evaluation of patients at risk for COVID-19 are in a state of rapid change based on information  released by regulatory bodies including the CDC and federal and state organizations. These policies and algorithms were followed during the patient's care in the ED.    Final Clinical Impression(s) / ED Diagnoses Final diagnoses:  Chest congestion  Primary hypertension  Non-intractable vomiting with nausea, unspecified vomiting type    Return for intractable cough, coughing up blood, fevers > 100.4 unrelieved by medication, shortness of breath, intractable vomiting, chest pain, shortness of breath, weakness, numbness, changes in speech, facial asymmetry, abdominal pain, passing out, Inability to tolerate liquids or food, cough, altered mental status or any concerns. No signs of systemic illness or infection. The patient is nontoxic-appearing on exam and vital signs are within normal limits. I have reviewed the triage vital signs and the nursing notes. Pertinent labs & imaging results that were available during my care of the patient were reviewed by me and considered in my medical decision making (see chart for details). After history, exam, and medical workup I feel the patient has been appropriately medically screened and is safe for discharge home. Pertinent diagnoses were discussed with the patient. Patient was given return precautions.  Rx / DC Orders ED Discharge Orders     None        Veta Dambrosia, MD 04/10/21 [phone]

## 2021-04-10 NOTE — ED Notes (Signed)
Ambulated to restroom x1 assist.

## 2021-04-10 NOTE — ED Triage Notes (Signed)
Brought in by family member c/o vomiting for the last 2-2.5 hours. Also reports cough/congestion for 3-4 days. Pt has been vaccinated and boosted for covid, and no known sick contacts.

## 2021-04-11 ENCOUNTER — Other Ambulatory Visit: Payer: Self-pay

## 2021-04-11 ENCOUNTER — Inpatient Hospital Stay (HOSPITAL_BASED_OUTPATIENT_CLINIC_OR_DEPARTMENT_OTHER)
Admission: EM | Admit: 2021-04-11 | Discharge: 2021-04-15 | DRG: 391 | Disposition: A | Discharge status: Home Health | Payer: Medicare Other | Attending: Internal Medicine | Admitting: Internal Medicine

## 2021-04-11 ENCOUNTER — Encounter (HOSPITAL_BASED_OUTPATIENT_CLINIC_OR_DEPARTMENT_OTHER): Payer: Self-pay | Admitting: *Deleted

## 2021-04-11 DIAGNOSIS — E876 Hypokalemia: Secondary | ICD-10-CM | POA: Diagnosis present

## 2021-04-11 DIAGNOSIS — Z20822 Contact with and (suspected) exposure to covid-19: Secondary | ICD-10-CM | POA: Diagnosis present

## 2021-04-11 DIAGNOSIS — Z8261 Family history of arthritis: Secondary | ICD-10-CM

## 2021-04-11 DIAGNOSIS — R7303 Prediabetes: Secondary | ICD-10-CM | POA: Diagnosis present

## 2021-04-11 DIAGNOSIS — J45909 Unspecified asthma, uncomplicated: Secondary | ICD-10-CM | POA: Diagnosis present

## 2021-04-11 DIAGNOSIS — I10 Essential (primary) hypertension: Secondary | ICD-10-CM | POA: Diagnosis present

## 2021-04-11 DIAGNOSIS — G9341 Metabolic encephalopathy: Secondary | ICD-10-CM | POA: Diagnosis present

## 2021-04-11 DIAGNOSIS — R4182 Altered mental status, unspecified: Secondary | ICD-10-CM

## 2021-04-11 DIAGNOSIS — Z79899 Other long term (current) drug therapy: Secondary | ICD-10-CM

## 2021-04-11 DIAGNOSIS — E860 Dehydration: Secondary | ICD-10-CM | POA: Diagnosis not present

## 2021-04-11 DIAGNOSIS — Z9011 Acquired absence of right breast and nipple: Secondary | ICD-10-CM

## 2021-04-11 DIAGNOSIS — R112 Nausea with vomiting, unspecified: Principal | ICD-10-CM | POA: Diagnosis present

## 2021-04-11 DIAGNOSIS — D3A8 Other benign neuroendocrine tumors: Secondary | ICD-10-CM

## 2021-04-11 DIAGNOSIS — Z888 Allergy status to other drugs, medicaments and biological substances status: Secondary | ICD-10-CM

## 2021-04-11 DIAGNOSIS — Z823 Family history of stroke: Secondary | ICD-10-CM

## 2021-04-11 DIAGNOSIS — E785 Hyperlipidemia, unspecified: Secondary | ICD-10-CM | POA: Diagnosis present

## 2021-04-11 DIAGNOSIS — I739 Peripheral vascular disease, unspecified: Secondary | ICD-10-CM | POA: Diagnosis present

## 2021-04-11 DIAGNOSIS — Z7952 Long term (current) use of systemic steroids: Secondary | ICD-10-CM

## 2021-04-11 DIAGNOSIS — Z8249 Family history of ischemic heart disease and other diseases of the circulatory system: Secondary | ICD-10-CM

## 2021-04-11 DIAGNOSIS — K219 Gastro-esophageal reflux disease without esophagitis: Secondary | ICD-10-CM | POA: Diagnosis present

## 2021-04-11 DIAGNOSIS — R000 Tachycardia, unspecified: Secondary | ICD-10-CM | POA: Diagnosis not present

## 2021-04-11 MED ORDER — SODIUM CHLORIDE 0.9 % IV BOLUS
500.0000 mL | Freq: Once | INTRAVENOUS | Status: AC
  Administered 2021-04-11: 500 mL via INTRAVENOUS

## 2021-04-11 MED ORDER — ONDANSETRON HCL 4 MG/2ML IJ SOLN
4.0000 mg | Freq: Once | INTRAMUSCULAR | Status: AC
  Administered 2021-04-11: 4 mg via INTRAVENOUS
  Filled 2021-04-11: qty 2

## 2021-04-11 NOTE — ED Triage Notes (Addendum)
Nausea and dry heaves x 2 days. She was seen yesterday for same and received IV fluids. No dry heaves at triage.

## 2021-04-12 ENCOUNTER — Emergency Department (HOSPITAL_BASED_OUTPATIENT_CLINIC_OR_DEPARTMENT_OTHER): Payer: Medicare Other

## 2021-04-12 DIAGNOSIS — E876 Hypokalemia: Secondary | ICD-10-CM | POA: Diagnosis not present

## 2021-04-12 DIAGNOSIS — R112 Nausea with vomiting, unspecified: Secondary | ICD-10-CM | POA: Diagnosis not present

## 2021-04-12 DIAGNOSIS — G9341 Metabolic encephalopathy: Secondary | ICD-10-CM | POA: Diagnosis not present

## 2021-04-12 DIAGNOSIS — I10 Essential (primary) hypertension: Secondary | ICD-10-CM | POA: Diagnosis not present

## 2021-04-12 DIAGNOSIS — S2231XA Fracture of one rib, right side, initial encounter for closed fracture: Secondary | ICD-10-CM | POA: Diagnosis not present

## 2021-04-12 DIAGNOSIS — Z20822 Contact with and (suspected) exposure to covid-19: Secondary | ICD-10-CM | POA: Diagnosis not present

## 2021-04-12 DIAGNOSIS — E785 Hyperlipidemia, unspecified: Secondary | ICD-10-CM | POA: Diagnosis not present

## 2021-04-12 DIAGNOSIS — I739 Peripheral vascular disease, unspecified: Secondary | ICD-10-CM | POA: Diagnosis not present

## 2021-04-12 DIAGNOSIS — R7303 Prediabetes: Secondary | ICD-10-CM | POA: Diagnosis not present

## 2021-04-12 DIAGNOSIS — Z888 Allergy status to other drugs, medicaments and biological substances status: Secondary | ICD-10-CM | POA: Diagnosis not present

## 2021-04-12 DIAGNOSIS — Z8249 Family history of ischemic heart disease and other diseases of the circulatory system: Secondary | ICD-10-CM | POA: Diagnosis not present

## 2021-04-12 DIAGNOSIS — K219 Gastro-esophageal reflux disease without esophagitis: Secondary | ICD-10-CM | POA: Diagnosis present

## 2021-04-12 DIAGNOSIS — D3A8 Other benign neuroendocrine tumors: Secondary | ICD-10-CM | POA: Diagnosis not present

## 2021-04-12 DIAGNOSIS — R531 Weakness: Secondary | ICD-10-CM | POA: Diagnosis not present

## 2021-04-12 DIAGNOSIS — Z7952 Long term (current) use of systemic steroids: Secondary | ICD-10-CM | POA: Diagnosis not present

## 2021-04-12 DIAGNOSIS — Z79899 Other long term (current) drug therapy: Secondary | ICD-10-CM | POA: Diagnosis not present

## 2021-04-12 DIAGNOSIS — Z8261 Family history of arthritis: Secondary | ICD-10-CM | POA: Diagnosis not present

## 2021-04-12 DIAGNOSIS — Z823 Family history of stroke: Secondary | ICD-10-CM | POA: Diagnosis not present

## 2021-04-12 DIAGNOSIS — E860 Dehydration: Secondary | ICD-10-CM | POA: Diagnosis present

## 2021-04-12 DIAGNOSIS — Z9011 Acquired absence of right breast and nipple: Secondary | ICD-10-CM | POA: Diagnosis not present

## 2021-04-12 DIAGNOSIS — J45909 Unspecified asthma, uncomplicated: Secondary | ICD-10-CM | POA: Diagnosis not present

## 2021-04-12 LAB — URINALYSIS, ROUTINE W REFLEX MICROSCOPIC
Glucose, UA: NEGATIVE mg/dL
Ketones, ur: 15 mg/dL — AB
Leukocytes,Ua: NEGATIVE
Nitrite: NEGATIVE
Protein, ur: 100 mg/dL — AB
Specific Gravity, Urine: >1.03 — ABNORMAL HIGH (ref 1.005–1.030)
pH: 6 (ref 5.0–8.0)

## 2021-04-12 LAB — CBC WITH DIFFERENTIAL/PLATELET
Abs Immature Granulocytes: 0.07 10*3/uL (ref 0.00–0.07)
Basophils Absolute: 0 10*3/uL (ref 0.0–0.1)
Basophils Relative: 0 %
Eosinophils Absolute: 0 10*3/uL (ref 0.0–0.5)
Eosinophils Relative: 0 %
HCT: 43.7 % (ref 36.0–46.0)
Hemoglobin: 15.3 g/dL — ABNORMAL HIGH (ref 12.0–15.0)
Immature Granulocytes: 1 %
Lymphocytes Relative: 7 %
Lymphs Abs: 1 10*3/uL (ref 0.7–4.0)
MCH: 33.1 pg (ref 26.0–34.0)
MCHC: 35 g/dL (ref 30.0–36.0)
MCV: 94.6 fL (ref 80.0–100.0)
Monocytes Absolute: 1 10*3/uL (ref 0.1–1.0)
Monocytes Relative: 7 %
Neutro Abs: 11.9 10*3/uL — ABNORMAL HIGH (ref 1.7–7.7)
Neutrophils Relative %: 85 %
Platelets: 343 10*3/uL (ref 150–400)
RBC: 4.62 MIL/uL (ref 3.87–5.11)
RDW: 13.3 % (ref 11.5–15.5)
WBC: 13.9 10*3/uL — ABNORMAL HIGH (ref 4.0–10.5)
nRBC: 0 % (ref 0.0–0.2)

## 2021-04-12 LAB — COMPREHENSIVE METABOLIC PANEL
ALT: 38 U/L (ref 0–44)
AST: 34 U/L (ref 15–41)
Albumin: 5.3 g/dL — ABNORMAL HIGH (ref 3.5–5.0)
Alkaline Phosphatase: 88 U/L (ref 38–126)
Anion gap: 18 — ABNORMAL HIGH (ref 5–15)
BUN: 32 mg/dL — ABNORMAL HIGH (ref 8–23)
CO2: 29 mmol/L (ref 22–32)
Calcium: 10.4 mg/dL — ABNORMAL HIGH (ref 8.9–10.3)
Chloride: 92 mmol/L — ABNORMAL LOW (ref 98–111)
Creatinine, Ser: 0.87 mg/dL (ref 0.44–1.00)
GFR, Estimated: >60 mL/min (ref >60)
Glucose, Bld: 166 mg/dL — ABNORMAL HIGH (ref 70–99)
Potassium: 2.6 mmol/L — CL (ref 3.5–5.1)
Sodium: 139 mmol/L (ref 135–145)
Total Bilirubin: 1.4 mg/dL — ABNORMAL HIGH (ref 0.3–1.2)
Total Protein: 8.7 g/dL — ABNORMAL HIGH (ref 6.5–8.1)

## 2021-04-12 LAB — BASIC METABOLIC PANEL
Anion gap: 14 (ref 5–15)
BUN: 31 mg/dL — ABNORMAL HIGH (ref 8–23)
CO2: 28 mmol/L (ref 22–32)
Calcium: 9.6 mg/dL (ref 8.9–10.3)
Chloride: 99 mmol/L (ref 98–111)
Creatinine, Ser: 0.62 mg/dL (ref 0.44–1.00)
GFR, Estimated: >60 mL/min (ref >60)
Glucose, Bld: 140 mg/dL — ABNORMAL HIGH (ref 70–99)
Potassium: 2.7 mmol/L — CL (ref 3.5–5.1)
Sodium: 141 mmol/L (ref 135–145)

## 2021-04-12 LAB — MAGNESIUM: Magnesium: 2.3 mg/dL (ref 1.7–2.4)

## 2021-04-12 LAB — RESP PANEL BY RT-PCR (FLU A&B, COVID) ARPGX2
Influenza A by PCR: NEGATIVE
Influenza B by PCR: NEGATIVE
SARS Coronavirus 2 by RT PCR: NEGATIVE

## 2021-04-12 LAB — TROPONIN I (HIGH SENSITIVITY)
Troponin I (High Sensitivity): 41 ng/L — ABNORMAL HIGH (ref <18)
Troponin I (High Sensitivity): 49 ng/L — ABNORMAL HIGH (ref <18)

## 2021-04-12 LAB — URINALYSIS, MICROSCOPIC (REFLEX)

## 2021-04-12 LAB — LIPASE, BLOOD: Lipase: 28 U/L (ref 11–51)

## 2021-04-12 MED ORDER — HYDRALAZINE HCL 20 MG/ML IJ SOLN
10.0000 mg | Freq: Four times a day (QID) | INTRAMUSCULAR | Status: DC | PRN
  Administered 2021-04-12 – 2021-04-15 (×5): 10 mg via INTRAVENOUS
  Filled 2021-04-12 (×5): qty 1

## 2021-04-12 MED ORDER — PROCHLORPERAZINE EDISYLATE 10 MG/2ML IJ SOLN
5.0000 mg | Freq: Once | INTRAMUSCULAR | Status: AC
  Administered 2021-04-12: 5 mg via INTRAVENOUS
  Filled 2021-04-12: qty 2

## 2021-04-12 MED ORDER — ONDANSETRON HCL 4 MG/2ML IJ SOLN
4.0000 mg | Freq: Four times a day (QID) | INTRAMUSCULAR | Status: DC | PRN
  Administered 2021-04-12 – 2021-04-14 (×3): 4 mg via INTRAVENOUS
  Filled 2021-04-12 (×3): qty 2

## 2021-04-12 MED ORDER — ACETAMINOPHEN 325 MG PO TABS
650.0000 mg | ORAL_TABLET | Freq: Four times a day (QID) | ORAL | Status: DC | PRN
  Administered 2021-04-13 (×2): 650 mg via ORAL
  Filled 2021-04-12 (×2): qty 2

## 2021-04-12 MED ORDER — TIZANIDINE HCL 4 MG PO TABS
2.0000 mg | ORAL_TABLET | Freq: Once | ORAL | Status: AC
  Administered 2021-04-13: 2 mg via ORAL
  Filled 2021-04-12: qty 1

## 2021-04-12 MED ORDER — SODIUM CHLORIDE 0.9 % IV BOLUS
1000.0000 mL | Freq: Once | INTRAVENOUS | Status: AC
  Administered 2021-04-12: 1000 mL via INTRAVENOUS

## 2021-04-12 MED ORDER — ACETAMINOPHEN 650 MG RE SUPP: 650.0000 mg | Freq: Four times a day (QID) | RECTAL | Status: DC | PRN

## 2021-04-12 MED ORDER — POTASSIUM CHLORIDE 10 MEQ/100ML IV SOLN
10.0000 meq | Freq: Once | INTRAVENOUS | Status: AC
  Administered 2021-04-12: 10 meq via INTRAVENOUS
  Filled 2021-04-12: qty 100

## 2021-04-12 MED ORDER — ONDANSETRON HCL 4 MG PO TABS
4.0000 mg | ORAL_TABLET | Freq: Four times a day (QID) | ORAL | Status: DC | PRN
  Administered 2021-04-13: 4 mg via ORAL
  Filled 2021-04-12: qty 1

## 2021-04-12 MED ORDER — POTASSIUM CHLORIDE CRYS ER 10 MEQ PO TBCR
40.0000 meq | EXTENDED_RELEASE_TABLET | Freq: Two times a day (BID) | ORAL | Status: DC
  Administered 2021-04-12 – 2021-04-14 (×4): 40 meq via ORAL
  Filled 2021-04-12 (×4): qty 4

## 2021-04-12 MED ORDER — SODIUM CHLORIDE 0.9 % IV BOLUS
500.0000 mL | Freq: Once | INTRAVENOUS | Status: AC
  Administered 2021-04-12: 500 mL via INTRAVENOUS

## 2021-04-12 MED ORDER — METOCLOPRAMIDE HCL 5 MG/ML IJ SOLN
10.0000 mg | Freq: Four times a day (QID) | INTRAMUSCULAR | Status: AC
  Administered 2021-04-12 – 2021-04-13 (×2): 10 mg via INTRAVENOUS
  Filled 2021-04-12 (×2): qty 2

## 2021-04-12 MED ORDER — SODIUM CHLORIDE 0.45 % IV SOLN
INTRAVENOUS | Status: DC
  Filled 2021-04-12 (×9): qty 1000

## 2021-04-12 MED ORDER — PANTOPRAZOLE SODIUM 40 MG IV SOLR
40.0000 mg | Freq: Every day | INTRAVENOUS | Status: DC
  Administered 2021-04-12 – 2021-04-13 (×2): 40 mg via INTRAVENOUS
  Filled 2021-04-12 (×2): qty 40

## 2021-04-12 MED ORDER — FENTANYL CITRATE (PF) 100 MCG/2ML IJ SOLN
12.5000 ug | Freq: Once | INTRAMUSCULAR | Status: AC
Start: 2021-04-12 — End: 2021-04-12
  Administered 2021-04-12: 12.5 ug via INTRAVENOUS
  Filled 2021-04-12: qty 2

## 2021-04-12 MED ORDER — TRAMADOL HCL 50 MG PO TABS
50.0000 mg | ORAL_TABLET | Freq: Once | ORAL | Status: AC
  Administered 2021-04-12: 50 mg via ORAL
  Filled 2021-04-12: qty 1

## 2021-04-12 MED ORDER — POTASSIUM CHLORIDE IN NACL 40-0.9 MEQ/L-% IV SOLN: INTRAVENOUS | Status: DC

## 2021-04-12 MED ORDER — ENOXAPARIN SODIUM 40 MG/0.4ML IJ SOSY
40.0000 mg | PREFILLED_SYRINGE | INTRAMUSCULAR | Status: DC
  Administered 2021-04-12 – 2021-04-14 (×3): 40 mg via SUBCUTANEOUS
  Filled 2021-04-12 (×3): qty 0.4

## 2021-04-12 NOTE — ED Notes (Signed)
Spoke to pt's dtg.  Updated her of pt's status and that she will be admitted to [facility].

## 2021-04-12 NOTE — Progress Notes (Signed)
Patient arrived from [facility] in stable condition. BP 199/88. Paged admitting to make them aware. Awaiting orders at this time. Telemetry connected.

## 2021-04-12 NOTE — ED Notes (Signed)
Given crackers and gingerale, denies nausea or abd pain

## 2021-04-12 NOTE — ED Notes (Signed)
Report given to Lonnie with [facility] 

## 2021-04-12 NOTE — H&P (Signed)
History and Physical    ASLEE SUCH QMV:[id number] DOB: 19-Oct-1945 DOA: 04/11/2021  PCP: [name], MD   Patient coming from: Home  I have personally briefly reviewed patient's old medical records in [place]  Chief Complaint: Abdominal pain  HPI: [name] is a 75 y.o. female with medical history significant for HLD, neuroendocrine tumor who presents for evaluation of abdominal pain mostly periumbilical, nonradiating and rated 6 x 10 in intensity at its worst.  Abdominal pain is associated with nausea and multiple episodes of emesis.  Patient's last bowel movement was 1 day prior to her admission. She was seen in the ER previously for similar symptoms and was discharged home because her symptoms improved. She denies having any chest pain, no cough, no fever, no chills, no dizziness, no lightheadedness, no headache, no focal deficits or blurred vision. Labs show sodium 139, potassium 2.6, chloride 92, bicarb 29, glucose 166, BUN 32, creatinine 0.87, calcium 10.4, magnesium 2.3, alkaline phosphatase 88, albumin 5.3, lipase 28, AST 34, ALT 38, total protein 8.7, total bilirubin 1.4, troponin 41 >> 49, white count 13.9, hemoglobin 15.3, hematocrit 43.7, MCV 94.6, RDW 13.3, platelet count 343 Respiratory viral panel is negative Chest x-ray shows clear lungs. Twelve-lead EKG reviewed by me shows normal sinus rhythm       ED Course: Is a 75 year old female with a history of neuroendocrine tumor who presents to the emergency room for evaluation of abdominal pain associated with nausea and multiple episodes of emesis.  She is found to have hypokalemia with a potassium level of 2.6. She will be referred to observation status for further evaluation.   Review of Systems: As per HPI otherwise all other systems reviewed and negative.    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   Complication of anesthesia    GERD  (gastroesophageal reflux disease)    at times   Hyperlipidemia    Hypertension    Peripheral vascular disease ([place])    has spider veins   Pneumonia    Pneumothorax, traumatic    RIGHT; associated with multiple rib fractures after a fall   PONV (postoperative nausea and vomiting)    Pre-diabetes     Past Surgical History:  Procedure Laterality Date   ABDOMINAL HYSTERECTOMY  1982   AUGMENTATION MAMMAPLASTY Bilateral    Patient has bilateral implants    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   BREAST SURGERY     ESOPHAGOGASTRODUODENOSCOPY (EGD) WITH PROPOFOL N/A 01/05/2020   Procedure: ESOPHAGOGASTRODUODENOSCOPY (EGD) WITH PROPOFOL;  Surgeon: Arta Silence, MD;  Location: [facility];  Service: Endoscopy;  Laterality: N/A;   EUS N/A 01/05/2020   Procedure: UPPER ENDOSCOPIC ULTRASOUND (EUS) LINEAR;  Surgeon: Arta Silence, MD;  Location: [facility];  Service: Endoscopy;  Laterality: N/A;   FINE NEEDLE ASPIRATION N/A 01/05/2020   Procedure: FINE NEEDLE ASPIRATION (FNA) LINEAR;  Surgeon: Arta Silence, MD;  Location: [facility];  Service: Endoscopy;  Laterality: N/A;   JOINT REPLACEMENT  2009   right knee   JOINT REPLACEMENT  2011   left knee   MASTECTOMY  2008   right breast   REDUCTION MAMMAPLASTY Left    Patient had lift with implant on Left side    SHOULDER ARTHROSCOPY DISTAL CLAVICLE EXCISION AND OPEN ROTATOR CUFF REPAIR  2011   TOTAL SHOULDER ARTHROPLASTY  03/14/2012   Procedure: TOTAL SHOULDER ARTHROPLASTY;  Surgeon: Alta Corning, MD;  Location: [facility];  Service: Orthopedics;  Laterality: Left;     reports that she has never smoked. She has never used smokeless tobacco. She reports that she does not drink alcohol and does not use drugs.  Allergies  Allergen Reactions   Dilaudid [Hydromorphone Hcl] Itching   Morphine And Related Nausea And Vomiting    Family History  Problem Relation Age of Onset   Breast cancer Sister    Rheum arthritis Sister    Breast  cancer Paternal Grandmother    Kidney Stones Mother    CVA Mother    Hypertension Father    Anesthesia problems Neg Hx    Hypotension Neg Hx    Malignant hyperthermia Neg Hx    Pseudochol deficiency Neg Hx       Prior to Admission medications   Medication Sig Start Date End Date Taking? Authorizing Provider  acetaminophen (TYLENOL) 325 MG tablet Take 650 mg by mouth every 6 (six) hours as needed for mild pain or moderate pain.    Yes [provider]  albuterol (VENTOLIN HFA) 108 (90 Base) MCG/ACT inhaler Inhale 2 puffs into the lungs every 4 (four) hours as needed for wheezing or shortness of breath. 04/10/21  Yes Palumbo, April, MD  atorvastatin (LIPITOR) 20 MG tablet Take 1 tablet (20 mg total) by mouth daily at 6 PM. Patient taking differently: Take 20 mg by mouth daily. 08/12/20  Yes [name], MD  ibuprofen (ADVIL,MOTRIN) 100 MG tablet Take 100 mg by mouth every 6 (six) hours as needed for pain.    Yes [provider]  albuterol (PROAIR HFA) 108 (90 Base) MCG/ACT inhaler Inhale 2 puffs into the lungs every 6 (six) hours as needed for wheezing or shortness of breath. 08/12/20   [name], MD  diphenhydrAMINE (SOMINEX) 25 MG tablet Take 25 mg by mouth at bedtime.    [provider]  fenofibrate (TRICOR) 145 MG tablet Take 1 tablet (145 mg total) by mouth daily. 08/12/20   [name], MD  fluticasone-salmeterol (ADVAIR HFA) [id number] MCG/ACT inhaler Inhale 2 puffs into the lungs 2 (two) times daily. 08/12/20   [name], MD  gabapentin (NEURONTIN) 300 MG capsule 1 po BID prn. Patient taking differently: Take 300 mg by mouth 3 (three) times daily. 07/01/19   [name], MD  guaiFENesin-dextromethorphan (ROBITUSSIN DM) 100-10 MG/5ML syrup Take 5 mLs by mouth every 4 (four) hours as needed for cough. 01/30/21   Patrecia Pour, MD  olmesartan-hydrochlorothiazide (BENICAR HCT) 20-12.5 MG tablet Take 1 tablet by mouth daily. 08/12/20    [name], MD  ondansetron (ZOFRAN ODT) 4 MG disintegrating tablet Take 1 tablet (4 mg total) by mouth every 8 (eight) hours as needed for nausea or vomiting. 01/30/21   Patrecia Pour, MD  ondansetron (ZOFRAN ODT) 8 MG disintegrating tablet 8mg  ODT q8 hours prn nausea 04/10/21   Palumbo, April, MD  oxymetazoline (AFRIN) 0.05 % nasal spray Place 1 spray into both nostrils at bedtime.    [provider]  potassium chloride (KLOR-CON) 10 MEQ tablet Take 2 tablets (20 mEq total) by mouth daily for 2 days. Patient not taking: No sig reported 01/03/21 01/05/21  Nuala Alpha A, PA-C  potassium chloride SA (KLOR-CON) 20 MEQ tablet Take 1 tablet (20 mEq total) by mouth daily. 04/10/21   Palumbo, April, MD  predniSONE (DELTASONE) 20 MG tablet 3 tabs po day one, then 2 po daily  x 4 days 04/10/21   Randal Buba, April, MD  prochlorperazine (COMPAZINE) 5 MG tablet Take 1 tablet (5 mg total) by mouth every 8 (eight) hours as needed for refractory nausea / vomiting. 01/30/21   Patrecia Pour, MD  promethazine (PHENERGAN) 25 MG suppository Place 1 suppository (25 mg total) rectally every 6 (six) hours as needed for nausea or vomiting. 01/06/21   Tacy Learn, PA-C  promethazine (PHENERGAN) 25 MG tablet Take 1 tablet (25 mg total) by mouth every 6 (six) hours as needed for nausea or vomiting. 01/06/21   Tacy Learn, PA-C  Respiratory Therapy Supplies (FLUTTER) DEVI 1 each by Does not apply route 2 (two) times daily. 07/15/19   Julian Hy, DO  tiZANidine (ZANAFLEX) 2 MG tablet Take 2 mg by mouth 3 (three) times daily as needed. 03/17/21   [provider]  traMADol (ULTRAM) 50 MG tablet Take 50 mg by mouth 2 (two) times daily as needed. 03/19/21   [provider]    Physical Exam: Vitals:   04/12/21 1000 04/12/21 1100 04/12/21 1130 04/12/21 1253  BP: (!) 192/97 (!) 189/86  (!) 199/88  Pulse: 99 72  77  Resp: 18 16  (!) 22  Temp:   98.1 F (36.7 C) 98.5 F (36.9 C)  TempSrc:   Oral  Oral  SpO2: 99% 98%  99%  Weight:      Height:         Vitals:   04/12/21 1000 04/12/21 1100 04/12/21 1130 04/12/21 1253  BP: (!) 192/97 (!) 189/86  (!) 199/88  Pulse: 99 72  77  Resp: 18 16  (!) 22  Temp:   98.1 F (36.7 C) 98.5 F (36.9 C)  TempSrc:   Oral Oral  SpO2: 99% 98%  99%  Weight:      Height:          Constitutional: Alert and oriented x 3 . Not in any apparent distress HEENT:      Head: Normocephalic and atraumatic.         Eyes: PERLA, EOMI, Conjunctivae are normal. Sclera is non-icteric.       Mouth/Throat: Mucous membranes are dry       Neck: Supple with no signs of meningismus. Cardiovascular: Regular rate and rhythm. No murmurs, gallops, or rubs. 2+ symmetrical distal pulses are present . No JVD. No LE edema Respiratory: Respiratory effort normal .Lungs sounds clear bilaterally. No wheezes, crackles, or rhonchi.  Gastrointestinal: Soft, periumbilical tenderness, and non distended with positive bowel sounds.  Genitourinary: No CVA tenderness. Musculoskeletal: Nontender with normal range of motion in all extremities. No cyanosis, or erythema of extremities. Neurologic:  Face is symmetric. Moving all extremities. No gross focal neurologic deficits . Skin: Skin is warm, dry.  No rash or ulcers Psychiatric: Depressed mood and flat affect are normal    Labs on Admission: I have personally reviewed following labs and imaging studies  CBC: Recent Labs  Lab 04/10/21 0405 04/11/21 2356  WBC 10.6* 13.9*  NEUTROABS 7.6 11.9*  HGB 13.8 15.3*  HCT 39.7 43.7  MCV 95.2 94.6  PLT 286 161   Basic Metabolic Panel: Recent Labs  Lab 04/10/21 0343 04/11/21 2356  NA 137 139  K 3.1* 2.6*  CL 103 92*  CO2 23 29  GLUCOSE 144* 166*  BUN 14 32*  CREATININE 0.63 0.87  CALCIUM 9.6 10.4*  MG  --  2.3   GFR: Estimated Creatinine Clearance: 58 mL/min (by C-G formula based  on SCr of 0.87 mg/dL). Liver Function Tests: Recent Labs  Lab 04/11/21 2356  AST 34   ALT 38  ALKPHOS 88  BILITOT 1.4*  PROT 8.7*  ALBUMIN 5.3*   Recent Labs  Lab 04/11/21 2356  LIPASE 28   No results for input(s): AMMONIA in the last 168 hours. Coagulation Profile: No results for input(s): INR, PROTIME in the last 168 hours. Cardiac Enzymes: No results for input(s): CKTOTAL, CKMB, CKMBINDEX, TROPONINI in the last 168 hours. BNP (last 3 results) No results for input(s): PROBNP in the last 8760 hours. HbA1C: No results for input(s): HGBA1C in the last 72 hours. CBG: No results for input(s): GLUCAP in the last 168 hours. Lipid Profile: No results for input(s): CHOL, HDL, LDLCALC, TRIG, CHOLHDL, LDLDIRECT in the last 72 hours. Thyroid Function Tests: No results for input(s): TSH, T4TOTAL, FREET4, T3FREE, THYROIDAB in the last 72 hours. Anemia Panel: No results for input(s): VITAMINB12, FOLATE, FERRITIN, TIBC, IRON, RETICCTPCT in the last 72 hours. Urine analysis:    Component Value Date/Time   COLORURINE YELLOW 04/11/2021 2356   APPEARANCEUR HAZY (A) 04/11/2021 2356   LABSPEC >1.030 (H) 04/11/2021 2356   PHURINE 6.0 04/11/2021 2356   GLUCOSEU NEGATIVE 04/11/2021 2356   HGBUR MODERATE (A) 04/11/2021 2356   BILIRUBINUR MODERATE (A) 04/11/2021 2356   BILIRUBINUR negative 12/31/2016 1145   KETONESUR 15 (A) 04/11/2021 2356   PROTEINUR 100 (A) 04/11/2021 2356   UROBILINOGEN 0.2 12/31/2016 1145   UROBILINOGEN 0.2 03/14/2012 0612   NITRITE NEGATIVE 04/11/2021 2356   LEUKOCYTESUR NEGATIVE 04/11/2021 2356    Radiological Exams on Admission: DG Chest Portable 1 View  Result Date: 04/12/2021 CLINICAL DATA:  Weakness EXAM: PORTABLE CHEST 1 VIEW COMPARISON:  04/10/2021 FINDINGS: Lungs are clear. No pneumothorax or pleural effusion. Cardiac size within normal limits. Pulmonary vascularity is normal. Multiple remote right rib fractures with deformity of the right thoracic cage are again identified. Advanced degenerative changes are noted within the right shoulder.  Left shoulder shoulder arthroplasty has been performed. IMPRESSION: No active disease. Electronically Signed   By: Fidela Salisbury MD   On: 04/12/2021 03:35     Assessment/Plan Principal Problem:   Intractable nausea and vomiting Active Problems:   Essential hypertension   GERD (gastroesophageal reflux disease)   Hypokalemia      Hypokalemia Secondary to intractable nausea and vomiting Will supplement potassium    Intractable nausea and vomiting Supportive care Place patient on IV PPI, antiemetics as well as IV fluid hydration Trial of clears in a.m.    Hypertension Place patient on IV hydralazine for systolic blood pressure greater than 127mmHg  DVT prophylaxis: Lovenox  Code Status: full code  Family Communication: Greater than 50% of time was spent discussing plan of care with patient at the bedside.  All questions and concerns have been addressed.  She verbalizes understanding and agrees with the plan. Disposition Plan: Back to previous home environment Consults called: none  Status: Observation    Eren Ryser MD [facility]     04/12/2021, 3:01 PM

## 2021-04-12 NOTE — ED Notes (Signed)
Report given to rachel @ [facility]

## 2021-04-12 NOTE — ED Notes (Signed)
Pt drinking ginger ale without nausea or vomiting

## 2021-04-12 NOTE — ED Provider Notes (Signed)
[facility] Provider Note   CSN: [id number] Arrival date & time: 04/11/21  2026     History Chief Complaint  Patient presents with   Nausea    [name] is a 75 y.o. female.  The history is provided by the patient.  Emesis Severity:  Moderate Progression:  Worsening Chronicity:  Recurrent Relieved by:  Nothing Worsened by:  Nothing Associated symptoms: no abdominal pain, no cough, no diarrhea, no fever and no headaches   Patient presents with continued vomiting.  This has  been ongoing for several days.  Patient was seen in the emergency department recently for similar symptoms, felt improved and went home.  Since that time she has had continued vomiting.  She has nonbloody emesis.  She has been having normal bowel movements without blood.  No abdominal pain.  No chest pain.  No new medications.  No fevers or headache.    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   Complication of anesthesia    GERD (gastroesophageal reflux disease)    at times   Hyperlipidemia    Hypertension    Peripheral vascular disease ([place])    has spider veins   Pneumonia    Pneumothorax, traumatic    RIGHT; associated with multiple rib fractures after a fall   PONV (postoperative nausea and vomiting)    Pre-diabetes     Patient Active Problem List   Diagnosis Date Noted   Intractable nausea and vomiting 01/27/2021   Prediabetes 02/14/2020   Essential hypertension 08/19/2017   Hyperlipidemia 08/19/2017   Cough variant asthma 04/04/2016   Upper airway cough syndrome 04/04/2016   Osteoarthritis of left shoulder 03/24/2012    Past Surgical History:  Procedure Laterality Date   ABDOMINAL HYSTERECTOMY  1982   AUGMENTATION MAMMAPLASTY Bilateral    Patient has bilateral implants    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   BREAST SURGERY     ESOPHAGOGASTRODUODENOSCOPY (EGD) WITH PROPOFOL N/A 01/05/2020    Procedure: ESOPHAGOGASTRODUODENOSCOPY (EGD) WITH PROPOFOL;  Surgeon: Arta Silence, MD;  Location: [facility];  Service: Endoscopy;  Laterality: N/A;   EUS N/A 01/05/2020   Procedure: UPPER ENDOSCOPIC ULTRASOUND (EUS) LINEAR;  Surgeon: Arta Silence, MD;  Location: [facility];  Service: Endoscopy;  Laterality: N/A;   FINE NEEDLE ASPIRATION N/A 01/05/2020   Procedure: FINE NEEDLE ASPIRATION (FNA) LINEAR;  Surgeon: Arta Silence, MD;  Location: [facility];  Service: Endoscopy;  Laterality: N/A;   JOINT REPLACEMENT  2009   right knee   JOINT REPLACEMENT  2011   left knee   MASTECTOMY  2008   right breast   REDUCTION MAMMAPLASTY Left    Patient had lift with implant on Left side    SHOULDER ARTHROSCOPY DISTAL CLAVICLE EXCISION AND OPEN ROTATOR CUFF REPAIR  2011   TOTAL SHOULDER ARTHROPLASTY  03/14/2012   Procedure: TOTAL SHOULDER ARTHROPLASTY;  Surgeon: Alta Corning, MD;  Location: [facility];  Service: Orthopedics;  Laterality: Left;     OB History   No obstetric history on file.     Family History  Problem Relation Age of Onset   Breast cancer Sister    Rheum arthritis Sister    Breast cancer Paternal Grandmother    Kidney Stones Mother    CVA Mother    Hypertension Father    Anesthesia problems Neg Hx    Hypotension Neg Hx  Malignant hyperthermia Neg Hx    Pseudochol deficiency Neg Hx     Social History   Tobacco Use   Smoking status: Never   Smokeless tobacco: Never  Vaping Use   Vaping Use: Never used  Substance Use Topics   Alcohol use: No    Alcohol/week: 0.0 standard drinks   Drug use: No    Home Medications Prior to Admission medications   Medication Sig Start Date End Date Taking? Authorizing Provider  acetaminophen (TYLENOL) 325 MG tablet Take 650 mg by mouth every 6 (six) hours as needed for mild pain or moderate pain.     [provider]  albuterol (PROAIR HFA) 108 (90 Base) MCG/ACT inhaler Inhale 2 puffs into the lungs every 6 (six)  hours as needed for wheezing or shortness of breath. 08/12/20   [name], MD  albuterol (VENTOLIN HFA) 108 (90 Base) MCG/ACT inhaler Inhale 2 puffs into the lungs every 4 (four) hours as needed for wheezing or shortness of breath. 04/10/21   Palumbo, April, MD  atorvastatin (LIPITOR) 20 MG tablet Take 1 tablet (20 mg total) by mouth daily at 6 PM. Patient taking differently: Take 20 mg by mouth daily. 08/12/20   [name], MD  diphenhydrAMINE (SOMINEX) 25 MG tablet Take 25 mg by mouth at bedtime.    [provider]  fenofibrate (TRICOR) 145 MG tablet Take 1 tablet (145 mg total) by mouth daily. 08/12/20   [name], MD  fluticasone-salmeterol (ADVAIR HFA) [phone] MCG/ACT inhaler Inhale 2 puffs into the lungs 2 (two) times daily. 08/12/20   [name], MD  gabapentin (NEURONTIN) 300 MG capsule 1 po BID prn. Patient taking differently: Take 300 mg by mouth 3 (three) times daily. 07/01/19   [name], MD  guaiFENesin-dextromethorphan (ROBITUSSIN DM) 100-10 MG/5ML syrup Take 5 mLs by mouth every 4 (four) hours as needed for cough. 01/30/21   Patrecia Pour, MD  ibuprofen (ADVIL,MOTRIN) 100 MG tablet Take 100 mg by mouth every 6 (six) hours as needed for pain.     [provider]  olmesartan-hydrochlorothiazide (BENICAR HCT) 20-12.5 MG tablet Take 1 tablet by mouth daily. 08/12/20   [name], MD  ondansetron (ZOFRAN ODT) 4 MG disintegrating tablet Take 1 tablet (4 mg total) by mouth every 8 (eight) hours as needed for nausea or vomiting. 01/30/21   Patrecia Pour, MD  ondansetron (ZOFRAN ODT) 8 MG disintegrating tablet 8mg  ODT q8 hours prn nausea 04/10/21   Palumbo, April, MD  oxymetazoline (AFRIN) 0.05 % nasal spray Place 1 spray into both nostrils at bedtime.    [provider]  potassium chloride (KLOR-CON) 10 MEQ tablet Take 2 tablets (20 mEq total) by mouth daily for 2 days. Patient not taking: Reported on 01/27/2021 01/03/21 01/05/21   Nuala Alpha A, PA-C  potassium chloride SA (KLOR-CON) 20 MEQ tablet Take 1 tablet (20 mEq total) by mouth daily. 04/10/21   Palumbo, April, MD  predniSONE (DELTASONE) 20 MG tablet 3 tabs po day one, then 2 po daily x 4 days 04/10/21   Randal Buba, April, MD  prochlorperazine (COMPAZINE) 5 MG tablet Take 1 tablet (5 mg total) by mouth every 8 (eight) hours as needed for refractory nausea / vomiting. 01/30/21   Patrecia Pour, MD  promethazine (PHENERGAN) 25 MG suppository Place 1 suppository (25 mg total) rectally every 6 (six) hours as needed for nausea or vomiting. 01/06/21   Tacy Learn, PA-C  promethazine (PHENERGAN) 25  MG tablet Take 1 tablet (25 mg total) by mouth every 6 (six) hours as needed for nausea or vomiting. 01/06/21   Tacy Learn, PA-C  Respiratory Therapy Supplies (FLUTTER) DEVI 1 each by Does not apply route 2 (two) times daily. 07/15/19   Julian Hy, DO    Allergies    Dilaudid [hydromorphone hcl] and Morphine and related  Review of Systems   Review of Systems  Constitutional:  Positive for fatigue. Negative for fever.  Respiratory:  Negative for cough and shortness of breath.   Cardiovascular:  Negative for chest pain.  Gastrointestinal:  Positive for nausea and vomiting. Negative for abdominal pain, blood in stool and diarrhea.  Genitourinary:  Negative for dysuria.  Neurological:  Negative for headaches.  All other systems reviewed and are negative.  Physical Exam Updated Vital Signs BP (!) 195/87 (BP Location: Left Arm)   Pulse 90   Temp 98.1 F (36.7 C) (Oral)   Resp 14   Ht 1.664 m (5' 5.5")   Wt 77.1 kg   SpO2 95%   BMI 27.86 kg/m   Physical Exam CONSTITUTIONAL: Elderly and disheveled HEAD: Normocephalic/atraumatic EYES: EOMI/PERRL, no icterus ENMT: Mucous membranes dry NECK: supple no meningeal signs SPINE/BACK:entire spine nontender CV: S1/S2 noted, no murmurs/rubs/gallops noted LUNGS: Lungs are clear to auscultation bilaterally, no apparent  distress ABDOMEN: soft, nontender, no rebound or guarding, bowel sounds noted throughout abdomen GU:no cva tenderness NEURO: Pt is awake/alert/appropriate, moves all extremitiesx4.  No facial droop.,  Mild tremor noted.  No focal arm or leg weakness is noted. EXTREMITIES: pulses normal/equal, full ROM SKIN: warm, color normal PSYCH: flat affect  ED Results / Procedures / Treatments   Labs (all labs ordered are listed, but only abnormal results are displayed) Labs Reviewed  CBC WITH DIFFERENTIAL/PLATELET - Abnormal; Notable for the following components:      Result Value   WBC 13.9 (*)    Hemoglobin 15.3 (*)    Neutro Abs 11.9 (*)    All other components within normal limits  COMPREHENSIVE METABOLIC PANEL - Abnormal; Notable for the following components:   Potassium 2.6 (*)    Chloride 92 (*)    Glucose, Bld 166 (*)    BUN 32 (*)    Calcium 10.4 (*)    Total Protein 8.7 (*)    Albumin 5.3 (*)    Total Bilirubin 1.4 (*)    Anion gap 18 (*)    All other components within normal limits  URINALYSIS, ROUTINE W REFLEX MICROSCOPIC - Abnormal; Notable for the following components:   APPearance HAZY (*)    Specific Gravity, Urine >1.030 (*)    Hgb urine dipstick MODERATE (*)    Bilirubin Urine MODERATE (*)    Ketones, ur 15 (*)    Protein, ur 100 (*)    All other components within normal limits  URINALYSIS, MICROSCOPIC (REFLEX) - Abnormal; Notable for the following components:   Bacteria, UA MANY (*)    All other components within normal limits  TROPONIN I (HIGH SENSITIVITY) - Abnormal; Notable for the following components:   Troponin I (High Sensitivity) 41 (*)    All other components within normal limits  TROPONIN I (HIGH SENSITIVITY) - Abnormal; Notable for the following components:   Troponin I (High Sensitivity) 49 (*)    All other components within normal limits  RESP PANEL BY RT-PCR (FLU A&B, COVID) ARPGX2  LIPASE, BLOOD  MAGNESIUM    EKG EKG  Interpretation  Date/Time:  Tuesday  April 11 2021 23:31:27 EDT Ventricular Rate:  94 PR Interval:    QRS Duration: 104 QT Interval:  406 QTC Calculation: 508 R Axis:   82 Text Interpretation: indeterminate, likely sinus Borderline right axis deviation Nonspecific repol abnormality, diffuse leads Prolonged QT interval Confirmed by Ripley Fraise [phone]) on 04/12/2021 12:19:21 AM  Radiology DG Chest Portable 1 View  Result Date: 04/10/2021 CLINICAL DATA:  Cough, vomiting EXAM: PORTABLE CHEST 1 VIEW COMPARISON:  01/27/2021 FINDINGS: Lungs are well expanded, symmetric, and clear. No pneumothorax or pleural effusion. Cardiac size within normal limits. Pulmonary vascularity is normal. Degenerative changes seen within the right shoulder. Left total shoulder arthroplasty has been performed. No acute bone abnormality. IMPRESSION: No active disease. Electronically Signed   By: Fidela Salisbury MD   On: 04/10/2021 04:04    Procedures .Critical Care E&M  Date/Time: 04/12/2021 5:33 AM Performed by: Ripley Fraise, MD  Critical care provider statement:    Critical care time (minutes):  60   Critical care start time:  04/12/2021 4:30 AM   Critical care end time:  04/12/2021 5:30 AM   Critical care time was exclusive of:  Separately billable procedures and treating other patients   Critical care was necessary to treat or prevent imminent or life-threatening deterioration of the following conditions:  Dehydration and metabolic crisis   Critical care was time spent personally by me on the following activities:  Development of treatment plan with patient or surrogate, evaluation of patient's response to treatment, examination of patient, re-evaluation of patient's condition, pulse oximetry, ordering and review of radiographic studies, ordering and review of laboratory studies, review of old charts and ordering and performing treatments and interventions   I assumed direction of critical care for this patient  from another provider in my specialty: no     Care discussed with: admitting provider   After initial E/M assessment, critical care services were subsequently performed that were exclusive of separately billable procedures or treatment.     Medications Ordered in ED Medications  sodium chloride 0.9 % bolus 1,000 mL (has no administration in time range)  potassium chloride 10 mEq in 100 mL IVPB (has no administration in time range)  ondansetron (ZOFRAN) injection 4 mg (4 mg Intravenous Given 04/11/21 2352)  sodium chloride 0.9 % bolus 500 mL (0 mLs Intravenous Stopped 04/12/21 0040)  potassium chloride 10 mEq in 100 mL IVPB (0 mEq Intravenous Stopped 04/12/21 0142)  sodium chloride 0.9 % bolus 500 mL (0 mLs Intravenous Stopped 04/12/21 0207)    ED Course  I have reviewed the triage vital signs and the nursing notes.  Pertinent labs & imaging results that were available during my care of the patient were reviewed by me and considered in my medical decision making (see chart for details).    MDM Rules/Calculators/A&P                          12:49 AM Patient presents for continued vomiting. She was seen recently in the ED for vomiting and felt improved and discharged.  Since that time her symptoms have worsened.  Patient appears dehydrated.  She denies any pain complaints at this time and she is afebrile.  Labs are pending at this time 5:34 AM Patient monitored for several hours. Patient with concerning lab findings including dehydration as well as significant hypokalemia with EKG changes. Urine is concentrated and likely contaminant, less likely UTI  EKG Interpretation  Date/Time:  Wednesday April 12 2021 01:16:00 EDT Ventricular Rate:  81 PR Interval:  133 QRS Duration: 96 QT Interval:  452 QTC Calculation: 525 R Axis:   68 Text Interpretation: Sinus rhythm Prolonged QT interval Abnormal ekg Confirmed by Ripley Fraise [phone]) on 04/12/2021 1:35:31 AM        Patient also  having episodes of tachycardia. Patient with a flat affect and poor historian, but denies any pain.  There is no focal abdominal tenderness or distention Denies any chest pain. Troponins mildly elevated, but are currently flat Will admit to the hospital.  Discussed with Dr. Cyd Silence for admission Final Clinical Impression(s) / ED Diagnoses Final diagnoses:  Hypokalemia  Dehydration    Rx / DC Orders ED Discharge Orders     None        Ripley Fraise, MD 04/12/21 [phone]

## 2021-04-12 NOTE — ED Notes (Signed)
Pt desating while asleep, 2L [place] O2 applied, sats WNL.

## 2021-04-12 NOTE — ED Notes (Signed)
Pt repeatedly using call bell, requests to open door, open the blinds, pt has really no complaints, appears she wants someone in the room.  Updated pt of pending admission to [facility] hospital. Questions answered.

## 2021-04-12 NOTE — ED Notes (Signed)
Assisted to [facility], voided about 261ml of dark amber urine

## 2021-04-13 DIAGNOSIS — D3A8 Other benign neuroendocrine tumors: Secondary | ICD-10-CM | POA: Diagnosis present

## 2021-04-13 DIAGNOSIS — Z7952 Long term (current) use of systemic steroids: Secondary | ICD-10-CM | POA: Diagnosis not present

## 2021-04-13 DIAGNOSIS — E876 Hypokalemia: Secondary | ICD-10-CM | POA: Diagnosis present

## 2021-04-13 DIAGNOSIS — Z9011 Acquired absence of right breast and nipple: Secondary | ICD-10-CM | POA: Diagnosis not present

## 2021-04-13 DIAGNOSIS — J323 Chronic sphenoidal sinusitis: Secondary | ICD-10-CM | POA: Diagnosis not present

## 2021-04-13 DIAGNOSIS — G9341 Metabolic encephalopathy: Secondary | ICD-10-CM | POA: Diagnosis present

## 2021-04-13 DIAGNOSIS — R7303 Prediabetes: Secondary | ICD-10-CM | POA: Diagnosis present

## 2021-04-13 DIAGNOSIS — R112 Nausea with vomiting, unspecified: Principal | ICD-10-CM

## 2021-04-13 DIAGNOSIS — E860 Dehydration: Secondary | ICD-10-CM | POA: Diagnosis present

## 2021-04-13 DIAGNOSIS — Z20822 Contact with and (suspected) exposure to covid-19: Secondary | ICD-10-CM | POA: Diagnosis present

## 2021-04-13 DIAGNOSIS — Z823 Family history of stroke: Secondary | ICD-10-CM | POA: Diagnosis not present

## 2021-04-13 DIAGNOSIS — R410 Disorientation, unspecified: Secondary | ICD-10-CM | POA: Diagnosis not present

## 2021-04-13 DIAGNOSIS — Z888 Allergy status to other drugs, medicaments and biological substances status: Secondary | ICD-10-CM | POA: Diagnosis not present

## 2021-04-13 DIAGNOSIS — Z8261 Family history of arthritis: Secondary | ICD-10-CM | POA: Diagnosis not present

## 2021-04-13 DIAGNOSIS — I739 Peripheral vascular disease, unspecified: Secondary | ICD-10-CM | POA: Diagnosis present

## 2021-04-13 DIAGNOSIS — I6782 Cerebral ischemia: Secondary | ICD-10-CM | POA: Diagnosis not present

## 2021-04-13 DIAGNOSIS — K219 Gastro-esophageal reflux disease without esophagitis: Secondary | ICD-10-CM | POA: Diagnosis present

## 2021-04-13 DIAGNOSIS — J45909 Unspecified asthma, uncomplicated: Secondary | ICD-10-CM | POA: Diagnosis present

## 2021-04-13 DIAGNOSIS — I10 Essential (primary) hypertension: Secondary | ICD-10-CM | POA: Diagnosis present

## 2021-04-13 DIAGNOSIS — Z79899 Other long term (current) drug therapy: Secondary | ICD-10-CM | POA: Diagnosis not present

## 2021-04-13 DIAGNOSIS — Z8249 Family history of ischemic heart disease and other diseases of the circulatory system: Secondary | ICD-10-CM | POA: Diagnosis not present

## 2021-04-13 DIAGNOSIS — E785 Hyperlipidemia, unspecified: Secondary | ICD-10-CM | POA: Diagnosis present

## 2021-04-13 DIAGNOSIS — G319 Degenerative disease of nervous system, unspecified: Secondary | ICD-10-CM | POA: Diagnosis not present

## 2021-04-13 LAB — BASIC METABOLIC PANEL
Anion gap: 9 (ref 5–15)
BUN: 33 mg/dL — ABNORMAL HIGH (ref 8–23)
CO2: 31 mmol/L (ref 22–32)
Calcium: 9.2 mg/dL (ref 8.9–10.3)
Chloride: 100 mmol/L (ref 98–111)
Creatinine, Ser: 0.5 mg/dL (ref 0.44–1.00)
GFR, Estimated: >60 mL/min (ref >60)
Glucose, Bld: 138 mg/dL — ABNORMAL HIGH (ref 70–99)
Potassium: 2.9 mmol/L — ABNORMAL LOW (ref 3.5–5.1)
Sodium: 140 mmol/L (ref 135–145)

## 2021-04-13 LAB — CBC
HCT: 37.2 % (ref 36.0–46.0)
Hemoglobin: 12.3 g/dL (ref 12.0–15.0)
MCH: 32.7 pg (ref 26.0–34.0)
MCHC: 33.1 g/dL (ref 30.0–36.0)
MCV: 98.9 fL (ref 80.0–100.0)
Platelets: 265 10*3/uL (ref 150–400)
RBC: 3.76 MIL/uL — ABNORMAL LOW (ref 3.87–5.11)
RDW: 13.6 % (ref 11.5–15.5)
WBC: 9.1 10*3/uL (ref 4.0–10.5)
nRBC: 0 % (ref 0.0–0.2)

## 2021-04-13 LAB — MAGNESIUM: Magnesium: 2.2 mg/dL (ref 1.7–2.4)

## 2021-04-13 MED ORDER — OLMESARTAN MEDOXOMIL-HCTZ 20-12.5 MG PO TABS: 1.0000 | ORAL_TABLET | Freq: Every day | ORAL | Status: DC

## 2021-04-13 MED ORDER — HYDROCHLOROTHIAZIDE 12.5 MG PO CAPS: 12.5000 mg | ORAL_CAPSULE | Freq: Every day | ORAL | Status: DC

## 2021-04-13 MED ORDER — PANTOPRAZOLE SODIUM 40 MG IV SOLR
40.0000 mg | Freq: Two times a day (BID) | INTRAVENOUS | Status: DC
  Administered 2021-04-13 – 2021-04-14 (×2): 40 mg via INTRAVENOUS
  Filled 2021-04-13 (×2): qty 40

## 2021-04-13 MED ORDER — IRBESARTAN 150 MG PO TABS
150.0000 mg | ORAL_TABLET | Freq: Every day | ORAL | Status: DC
  Administered 2021-04-13: 150 mg via ORAL
  Filled 2021-04-13: qty 1

## 2021-04-13 NOTE — Progress Notes (Signed)
Patient ID: [name], female   DOB: 1945-11-22, 75 y.o.   MRN: [id number]  PROGRESS NOTE    [name]  BSJ:[id number] DOB: 1946/08/25 DOA: 04/11/2021 PCP: [name], MD   Brief Narrative:  75 year old female with history of hyperlipidemia, neuroendocrine tumor presented with abdominal pain with multiple episodes of nausea and vomiting.  On presentation, lipase and LFTs were normal.  Potassium was 2.6.  She was started on IV fluids.  Assessment & Plan:   Intractable nausea and vomiting -Questionable cause.  Lipase and LFTs normal on presentation.  Improving.  Advance diet to soft diet.  Continue IV fluids.  Continue PPI twice a day  Hypokalemia -Still 2.9 this morning.  Replace.  Repeat a.m. labs.  Hypertension -Blood pressure elevated.  Continue as needed hydralazine IV.  Might have to start scheduled oral medications.  History of neuroendocrine tumor -Outpatient follow-up   DVT prophylaxis: Lovenox Code Status: Full Family Communication: None at bedside Disposition Plan: Status is: Observation  The patient will require care spanning > 2 midnights and should be moved to inpatient because: Inpatient level of care appropriate due to severity of illness  Dispo: The patient is from: Home              Anticipated d/c is to: Home              Patient currently is not medically stable to d/c.   Difficult to place patient No  Consultants: None  Procedures: None  Antimicrobials: None   Subjective: Patient seen and examined at bedside.  Feels slightly better.  Still intermittently nauseous but no vomiting this morning.  No overnight fever, chest pain or worsening shortness of breath.  Denies worsening abdominal pain.  Objective: Vitals:   04/12/21 2122 04/13/21 0304 04/13/21 0310 04/13/21 0454  BP: (!) 168/70 (!) 179/91    Pulse: (!) 103 (!) 104    Resp: 18 20 19 16   Temp: 99.3 F (37.4 C) 99 F (37.2 C)    TempSrc:  Oral    SpO2: 97%     Weight:       Height:        Intake/Output Summary (Last 24 hours) at 04/13/2021 1009 Last data filed at 04/12/2021 1614 Gross per 24 hour  Intake 0 ml  Output --  Net 0 ml   Filed Weights   04/11/21 2033  Weight: 77.1 kg    Examination:  General exam: Appears calm and comfortable  Respiratory system: Bilateral decreased breath sounds at bases Cardiovascular system: S1 & S2 heard, intermittently tachycardic Gastrointestinal system: Abdomen is nondistended, soft and mildly tender in the epigastric region. Normal bowel sounds heard. Extremities: No cyanosis, clubbing, edema  Central nervous system: Alert and oriented. No focal neurological deficits. Moving extremities Skin: No rashes, lesions or ulcers Psychiatry: Mostly flat affect.    Data Reviewed: I have personally reviewed following labs and imaging studies  CBC: Recent Labs  Lab 04/10/21 0405 04/11/21 2356 04/13/21 0511  WBC 10.6* 13.9* 9.1  NEUTROABS 7.6 11.9*  --   HGB 13.8 15.3* 12.3  HCT 39.7 43.7 37.2  MCV 95.2 94.6 98.9  PLT 286 343 765   Basic Metabolic Panel: Recent Labs  Lab 04/10/21 0343 04/11/21 2356 04/12/21 1552 04/13/21 0511  NA 137 139 141 140  K 3.1* 2.6* 2.7* 2.9*  CL 103 92* 99 100  CO2 23 29 28 31   GLUCOSE 144* 166* 140* 138*  BUN 14 32* 31* 33*  CREATININE 0.63 0.87 0.62 0.50  CALCIUM 9.6 10.4* 9.6 9.2  MG  --  2.3  --  2.2   GFR: Estimated Creatinine Clearance: 63.1 mL/min (by C-G formula based on SCr of 0.5 mg/dL). Liver Function Tests: Recent Labs  Lab 04/11/21 2356  AST 34  ALT 38  ALKPHOS 88  BILITOT 1.4*  PROT 8.7*  ALBUMIN 5.3*   Recent Labs  Lab 04/11/21 2356  LIPASE 28   No results for input(s): AMMONIA in the last 168 hours. Coagulation Profile: No results for input(s): INR, PROTIME in the last 168 hours. Cardiac Enzymes: No results for input(s): CKTOTAL, CKMB, CKMBINDEX, TROPONINI in the last 168 hours. BNP (last 3 results) No results for input(s): PROBNP in  the last 8760 hours. HbA1C: No results for input(s): HGBA1C in the last 72 hours. CBG: No results for input(s): GLUCAP in the last 168 hours. Lipid Profile: No results for input(s): CHOL, HDL, LDLCALC, TRIG, CHOLHDL, LDLDIRECT in the last 72 hours. Thyroid Function Tests: No results for input(s): TSH, T4TOTAL, FREET4, T3FREE, THYROIDAB in the last 72 hours. Anemia Panel: No results for input(s): VITAMINB12, FOLATE, FERRITIN, TIBC, IRON, RETICCTPCT in the last 72 hours. Sepsis Labs: No results for input(s): PROCALCITON, LATICACIDVEN in the last 168 hours.  Recent Results (from the past 240 hour(s))  Resp Panel by RT-PCR (Flu A&B, Covid) Nasopharyngeal Swab     Status: None   Collection Time: 04/10/21  3:43 AM   Specimen: Nasopharyngeal Swab; Nasopharyngeal(NP) swabs in vial transport medium  Result Value Ref Range Status   SARS Coronavirus 2 by RT PCR NEGATIVE NEGATIVE Final    Comment: (NOTE) SARS-CoV-2 target nucleic acids are NOT DETECTED.  The SARS-CoV-2 RNA is generally detectable in upper respiratory specimens during the acute phase of infection. The lowest concentration of SARS-CoV-2 viral copies this assay can detect is 138 copies/mL. A negative result does not preclude SARS-Cov-2 infection and should not be used as the sole basis for treatment or other patient management decisions. A negative result may occur with  improper specimen collection/handling, submission of specimen other than nasopharyngeal swab, presence of viral mutation(s) within the areas targeted by this assay, and inadequate number of viral copies(<138 copies/mL). A negative result must be combined with clinical observations, patient history, and epidemiological information. The expected result is Negative.  Fact Sheet for Patients:  EntrepreneurPulse.com.au  Fact Sheet for Healthcare Providers:  IncredibleEmployment.be  This test is no t yet approved or cleared by  the [place] FDA and  has been authorized for detection and/or diagnosis of SARS-CoV-2 by FDA under an Emergency Use Authorization (EUA). This EUA will remain  in effect (meaning this test can be used) for the duration of the COVID-19 declaration under Section 564(b)(1) of the Act, 21 U.S.C.section 360bbb-3(b)(1), unless the authorization is terminated  or revoked sooner.       Influenza A by PCR NEGATIVE NEGATIVE Final   Influenza B by PCR NEGATIVE NEGATIVE Final    Comment: (NOTE) The Xpert Xpress SARS-CoV-2/FLU/RSV plus assay is intended as an aid in the diagnosis of influenza from Nasopharyngeal swab specimens and should not be used as a sole basis for treatment. Nasal washings and aspirates are unacceptable for Xpert Xpress SARS-CoV-2/FLU/RSV testing.  Fact Sheet for Patients: EntrepreneurPulse.com.au  Fact Sheet for Healthcare Providers: IncredibleEmployment.be  This test is not yet approved or cleared by the [place] FDA and has been authorized for detection and/or diagnosis of SARS-CoV-2 by FDA under an Emergency Use Authorization (EUA).  This EUA will remain in effect (meaning this test can be used) for the duration of the COVID-19 declaration under Section 564(b)(1) of the Act, 21 U.S.C. section 360bbb-3(b)(1), unless the authorization is terminated or revoked.  Performed at [facility], [address] [place], [place] [address]   Resp Panel by RT-PCR (Flu A&B, Covid) Nasopharyngeal Swab     Status: None   Collection Time: 04/12/21  3:26 AM   Specimen: Nasopharyngeal Swab; Nasopharyngeal(NP) swabs in vial transport medium  Result Value Ref Range Status   SARS Coronavirus 2 by RT PCR NEGATIVE NEGATIVE Final    Comment: (NOTE) SARS-CoV-2 target nucleic acids are NOT DETECTED.  The SARS-CoV-2 RNA is generally detectable in upper respiratory specimens during the acute phase of infection. The  lowest concentration of SARS-CoV-2 viral copies this assay can detect is 138 copies/mL. A negative result does not preclude SARS-Cov-2 infection and should not be used as the sole basis for treatment or other patient management decisions. A negative result may occur with  improper specimen collection/handling, submission of specimen other than nasopharyngeal swab, presence of viral mutation(s) within the areas targeted by this assay, and inadequate number of viral copies(<138 copies/mL). A negative result must be combined with clinical observations, patient history, and epidemiological information. The expected result is Negative.  Fact Sheet for Patients:  EntrepreneurPulse.com.au  Fact Sheet for Healthcare Providers:  IncredibleEmployment.be  This test is no t yet approved or cleared by the [place] FDA and  has been authorized for detection and/or diagnosis of SARS-CoV-2 by FDA under an Emergency Use Authorization (EUA). This EUA will remain  in effect (meaning this test can be used) for the duration of the COVID-19 declaration under Section 564(b)(1) of the Act, 21 U.S.C.section 360bbb-3(b)(1), unless the authorization is terminated  or revoked sooner.       Influenza A by PCR NEGATIVE NEGATIVE Final   Influenza B by PCR NEGATIVE NEGATIVE Final    Comment: (NOTE) The Xpert Xpress SARS-CoV-2/FLU/RSV plus assay is intended as an aid in the diagnosis of influenza from Nasopharyngeal swab specimens and should not be used as a sole basis for treatment. Nasal washings and aspirates are unacceptable for Xpert Xpress SARS-CoV-2/FLU/RSV testing.  Fact Sheet for Patients: EntrepreneurPulse.com.au  Fact Sheet for Healthcare Providers: IncredibleEmployment.be  This test is not yet approved or cleared by the [place] FDA and has been authorized for detection and/or diagnosis of SARS-CoV-2 by FDA under  an Emergency Use Authorization (EUA). This EUA will remain in effect (meaning this test can be used) for the duration of the COVID-19 declaration under Section 564(b)(1) of the Act, 21 U.S.C. section 360bbb-3(b)(1), unless the authorization is terminated or revoked.  Performed at [facility], [address], [place], [place] [address]          Radiology Studies: DG Chest Portable 1 View  Result Date: 04/12/2021 CLINICAL DATA:  Weakness EXAM: PORTABLE CHEST 1 VIEW COMPARISON:  04/10/2021 FINDINGS: Lungs are clear. No pneumothorax or pleural effusion. Cardiac size within normal limits. Pulmonary vascularity is normal. Multiple remote right rib fractures with deformity of the right thoracic cage are again identified. Advanced degenerative changes are noted within the right shoulder. Left shoulder shoulder arthroplasty has been performed. IMPRESSION: No active disease. Electronically Signed   By: Fidela Salisbury MD   On: 04/12/2021 03:35        Scheduled Meds:  enoxaparin (LOVENOX) injection  40 mg Subcutaneous Q24H   pantoprazole (PROTONIX)  IV  40 mg Intravenous Daily   potassium chloride  40 mEq Oral BID   Continuous Infusions:  sodium chloride 0.45 % with kcl 75 mL/hr at 04/13/21 0420          Aline August, MD [facility] 04/13/2021, 10:09 AM

## 2021-04-13 NOTE — Plan of Care (Signed)

## 2021-04-13 NOTE — Progress Notes (Addendum)
Mobility Specialist - Progress Note   04/13/21 1448  Mobility  Activity Ambulated in hall;Ambulated to bathroom  Level of Assistance Minimal assist, patient does 75% or more  Assistive Device Other (Comment) (IV Pole)  Distance Ambulated (ft) 50 ft  Mobility Ambulated with assistance in room;Ambulated with assistance in hallway  Mobility Response Tolerated fair  Mobility performed by Mobility specialist  $Mobility charge 1 Mobility    Pre-mobility: 99% SpO2 (on 1.5L)  During mobility: Moved to 2L Post-mobility: 98% SPO2   Pt ambulated 82ft in hallway with min A on 2L while pushing IV pole and hand held assist from mobility specialist. RW recommended to keep pt ambulation stable. Provided verbal cues because of decreased cognition throughout session. Pt went to bathroom after ambulation in hall. Pt left in recliner on 1.5L with alarm on and call bell at side.   [place] Specialist Acute Rehabilitation Services Office: [phone] 04/13/21, 2:55 PM

## 2021-04-14 ENCOUNTER — Inpatient Hospital Stay (HOSPITAL_COMMUNITY): Payer: Medicare Other

## 2021-04-14 DIAGNOSIS — K219 Gastro-esophageal reflux disease without esophagitis: Secondary | ICD-10-CM

## 2021-04-14 LAB — CBC WITH DIFFERENTIAL/PLATELET
Abs Immature Granulocytes: 0.03 10*3/uL (ref 0.00–0.07)
Basophils Absolute: 0 10*3/uL (ref 0.0–0.1)
Basophils Relative: 0 %
Eosinophils Absolute: 0 10*3/uL (ref 0.0–0.5)
Eosinophils Relative: 0 %
HCT: 37.3 % (ref 36.0–46.0)
Hemoglobin: 12.3 g/dL (ref 12.0–15.0)
Immature Granulocytes: 0 %
Lymphocytes Relative: 22 %
Lymphs Abs: 2 10*3/uL (ref 0.7–4.0)
MCH: 32.9 pg (ref 26.0–34.0)
MCHC: 33 g/dL (ref 30.0–36.0)
MCV: 99.7 fL (ref 80.0–100.0)
Monocytes Absolute: 1.1 10*3/uL — ABNORMAL HIGH (ref 0.1–1.0)
Monocytes Relative: 13 %
Neutro Abs: 5.9 10*3/uL (ref 1.7–7.7)
Neutrophils Relative %: 65 %
Platelets: 242 10*3/uL (ref 150–400)
RBC: 3.74 MIL/uL — ABNORMAL LOW (ref 3.87–5.11)
RDW: 13.5 % (ref 11.5–15.5)
WBC: 9.1 10*3/uL (ref 4.0–10.5)
nRBC: 0 % (ref 0.0–0.2)

## 2021-04-14 LAB — COMPREHENSIVE METABOLIC PANEL
ALT: 42 U/L (ref 0–44)
AST: 28 U/L (ref 15–41)
Albumin: 3.7 g/dL (ref 3.5–5.0)
Alkaline Phosphatase: 61 U/L (ref 38–126)
Anion gap: 9 (ref 5–15)
BUN: 26 mg/dL — ABNORMAL HIGH (ref 8–23)
CO2: 29 mmol/L (ref 22–32)
Calcium: 9.3 mg/dL (ref 8.9–10.3)
Chloride: 101 mmol/L (ref 98–111)
Creatinine, Ser: 0.6 mg/dL (ref 0.44–1.00)
GFR, Estimated: >60 mL/min (ref >60)
Glucose, Bld: 124 mg/dL — ABNORMAL HIGH (ref 70–99)
Potassium: 3.8 mmol/L (ref 3.5–5.1)
Sodium: 139 mmol/L (ref 135–145)
Total Bilirubin: 1.3 mg/dL — ABNORMAL HIGH (ref 0.3–1.2)
Total Protein: 6.3 g/dL — ABNORMAL LOW (ref 6.5–8.1)

## 2021-04-14 LAB — MAGNESIUM: Magnesium: 2 mg/dL (ref 1.7–2.4)

## 2021-04-14 LAB — RAPID URINE DRUG SCREEN, HOSP PERFORMED
Amphetamines: NOT DETECTED
Barbiturates: NOT DETECTED
Benzodiazepines: NOT DETECTED
Cocaine: NOT DETECTED
Opiates: NOT DETECTED
Tetrahydrocannabinol: NOT DETECTED

## 2021-04-14 MED ORDER — ALUM & MAG HYDROXIDE-SIMETH 200-200-20 MG/5ML PO SUSP: 15.0000 mL | ORAL | Status: DC | PRN

## 2021-04-14 MED ORDER — SUCRALFATE 1 GM/10ML PO SUSP
1.0000 g | Freq: Three times a day (TID) | ORAL | Status: DC
  Administered 2021-04-14 – 2021-04-15 (×4): 1 g via ORAL
  Filled 2021-04-14 (×5): qty 10

## 2021-04-14 MED ORDER — IRBESARTAN 150 MG PO TABS
150.0000 mg | ORAL_TABLET | Freq: Every day | ORAL | Status: DC
  Administered 2021-04-14 – 2021-04-15 (×2): 150 mg via ORAL
  Filled 2021-04-14 (×2): qty 1

## 2021-04-14 MED ORDER — PANTOPRAZOLE SODIUM 40 MG PO TBEC
40.0000 mg | DELAYED_RELEASE_TABLET | Freq: Two times a day (BID) | ORAL | Status: DC
  Administered 2021-04-14 – 2021-04-15 (×2): 40 mg via ORAL
  Filled 2021-04-14 (×2): qty 1

## 2021-04-14 MED ORDER — METOCLOPRAMIDE HCL 5 MG/ML IJ SOLN
10.0000 mg | Freq: Four times a day (QID) | INTRAMUSCULAR | Status: DC | PRN
  Administered 2021-04-14: 10 mg via INTRAVENOUS
  Filled 2021-04-14: qty 2

## 2021-04-14 MED ORDER — ONDANSETRON HCL 4 MG/2ML IJ SOLN
4.0000 mg | Freq: Four times a day (QID) | INTRAMUSCULAR | Status: DC
  Administered 2021-04-14 – 2021-04-15 (×4): 4 mg via INTRAVENOUS
  Filled 2021-04-14 (×5): qty 2

## 2021-04-14 NOTE — Progress Notes (Addendum)
Patient ID: [name], female   DOB: [birth year], 75 y.o.   MRN: [id number]  PROGRESS NOTE    [name]  TTS:[id number] DOB: 05/14/1946 DOA: 04/11/2021 PCP: [name], MD   Brief Narrative:  75 year old female with history of hyperlipidemia, neuroendocrine tumor presented with abdominal pain with multiple episodes of nausea and vomiting.  On presentation, lipase and LFTs were normal.  Potassium was 2.6.  She was started on IV fluids.  Assessment & Plan:   Intractable nausea and vomiting -Questionable cause.  Lipase and LFTs normal on presentation.  - Still nauseous.  Continue soft diet.  Change Zofran to as needed around-the-clock for 24 hours Add Reglan as needed.  Continue IV fluids.  Continue PPI twice a day.  Add Maalox as needed.    Hypokalemia -Improved.  Hypertension -Blood pressure elevated.  Continue as needed hydralazine IV.  Continue irbesartan.  Hydrochlorothiazide on hold  History of neuroendocrine tumor -Outpatient follow-up   DVT prophylaxis: Lovenox Code Status: Full Family Communication: Son on phone on 04/14/2021 Disposition Plan: Status is: Inpatient because: Inpatient level of care appropriate due to severity of illness  Dispo: The patient is from: Home              Anticipated d/c is to: Home              Patient currently is not medically stable to d/c.   Difficult to place patient No  Consultants: None  Procedures: None  Antimicrobials: None   Subjective: Patient seen and examined at bedside.  Does not feel well.  Still complains of nausea.  No overnight fever, worsening abdominal pain reported. Objective: Vitals:   04/13/21 2030 04/14/21 0409 04/14/21 0450 04/14/21 0747  BP: (!) 183/82 (!) 174/84 (!) 167/77 (!) 172/86  Pulse: 79 93 98 92  Resp: 20 17  17   Temp: 98.1 F (36.7 C) 99 F (37.2 C)  98.4 F (36.9 C)  TempSrc:  Oral  Oral  SpO2: 99% 96%  96%  Weight:      Height:        Intake/Output Summary (Last 24  hours) at 04/14/2021 0856 Last data filed at 04/13/2021 1516 Gross per 24 hour  Intake 1696.55 ml  Output 400 ml  Net 1296.55 ml    Filed Weights   04/11/21 2033  Weight: 77.1 kg    Examination:  General exam: No distress.  Currently on room air. Respiratory system: Decreased breath sounds at bases bilaterally Cardiovascular system: Currently rate controlled, S1-S2 heard  gastrointestinal system: Abdomen is slightly distended, soft and mildly tender in the epigastric region.  Bowel sounds are heard.   Extremities: Trace lower extremity edema; no clubbing Central nervous system: Awake and alert.  No focal neurological deficits.  Moves extremities  skin: No obvious ecchymosis/rashes Psychiatry: Flat affect mostly but intermittently gets anxious.   Data Reviewed: I have personally reviewed following labs and imaging studies  CBC: Recent Labs  Lab 04/10/21 0405 04/11/21 2356 04/13/21 0511 04/14/21 0435  WBC 10.6* 13.9* 9.1 9.1  NEUTROABS 7.6 11.9*  --  5.9  HGB 13.8 15.3* 12.3 12.3  HCT 39.7 43.7 37.2 37.3  MCV 95.2 94.6 98.9 99.7  PLT 286 343 265 092    Basic Metabolic Panel: Recent Labs  Lab 04/10/21 0343 04/11/21 2356 04/12/21 1552 04/13/21 0511 04/14/21 0435  NA 137 139 141 140 139  K 3.1* 2.6* 2.7* 2.9* 3.8  CL 103 92* 99 100 101  CO2 23 29  28 31 29   GLUCOSE 144* 166* 140* 138* 124*  BUN 14 32* 31* 33* 26*  CREATININE 0.63 0.87 0.62 0.50 0.60  CALCIUM 9.6 10.4* 9.6 9.2 9.3  MG  --  2.3  --  2.2 2.0    GFR: Estimated Creatinine Clearance: 63.1 mL/min (by C-G formula based on SCr of 0.6 mg/dL). Liver Function Tests: Recent Labs  Lab 04/11/21 2356 04/14/21 0435  AST 34 28  ALT 38 42  ALKPHOS 88 61  BILITOT 1.4* 1.3*  PROT 8.7* 6.3*  ALBUMIN 5.3* 3.7    Recent Labs  Lab 04/11/21 2356  LIPASE 28    No results for input(s): AMMONIA in the last 168 hours. Coagulation Profile: No results for input(s): INR, PROTIME in the last 168  hours. Cardiac Enzymes: No results for input(s): CKTOTAL, CKMB, CKMBINDEX, TROPONINI in the last 168 hours. BNP (last 3 results) No results for input(s): PROBNP in the last 8760 hours. HbA1C: No results for input(s): HGBA1C in the last 72 hours. CBG: No results for input(s): GLUCAP in the last 168 hours. Lipid Profile: No results for input(s): CHOL, HDL, LDLCALC, TRIG, CHOLHDL, LDLDIRECT in the last 72 hours. Thyroid Function Tests: No results for input(s): TSH, T4TOTAL, FREET4, T3FREE, THYROIDAB in the last 72 hours. Anemia Panel: No results for input(s): VITAMINB12, FOLATE, FERRITIN, TIBC, IRON, RETICCTPCT in the last 72 hours. Sepsis Labs: No results for input(s): PROCALCITON, LATICACIDVEN in the last 168 hours.  Recent Results (from the past 240 hour(s))  Resp Panel by RT-PCR (Flu A&B, Covid) Nasopharyngeal Swab     Status: None   Collection Time: 04/10/21  3:43 AM   Specimen: Nasopharyngeal Swab; Nasopharyngeal(NP) swabs in vial transport medium  Result Value Ref Range Status   SARS Coronavirus 2 by RT PCR NEGATIVE NEGATIVE Final    Comment: (NOTE) SARS-CoV-2 target nucleic acids are NOT DETECTED.  The SARS-CoV-2 RNA is generally detectable in upper respiratory specimens during the acute phase of infection. The lowest concentration of SARS-CoV-2 viral copies this assay can detect is 138 copies/mL. A negative result does not preclude SARS-Cov-2 infection and should not be used as the sole basis for treatment or other patient management decisions. A negative result may occur with  improper specimen collection/handling, submission of specimen other than nasopharyngeal swab, presence of viral mutation(s) within the areas targeted by this assay, and inadequate number of viral copies(<138 copies/mL). A negative result must be combined with clinical observations, patient history, and epidemiological information. The expected result is Negative.  Fact Sheet for Patients:   EntrepreneurPulse.com.au  Fact Sheet for Healthcare Providers:  IncredibleEmployment.be  This test is no t yet approved or cleared by the [place] FDA and  has been authorized for detection and/or diagnosis of SARS-CoV-2 by FDA under an Emergency Use Authorization (EUA). This EUA will remain  in effect (meaning this test can be used) for the duration of the COVID-19 declaration under Section 564(b)(1) of the Act, 21 U.S.C.section 360bbb-3(b)(1), unless the authorization is terminated  or revoked sooner.       Influenza A by PCR NEGATIVE NEGATIVE Final   Influenza B by PCR NEGATIVE NEGATIVE Final    Comment: (NOTE) The Xpert Xpress SARS-CoV-2/FLU/RSV plus assay is intended as an aid in the diagnosis of influenza from Nasopharyngeal swab specimens and should not be used as a sole basis for treatment. Nasal washings and aspirates are unacceptable for Xpert Xpress SARS-CoV-2/FLU/RSV testing.  Fact Sheet for Patients: EntrepreneurPulse.com.au  Fact Sheet for Healthcare Providers: IncredibleEmployment.be  This test is not yet approved or cleared by the [place] and has been authorized for detection and/or diagnosis of SARS-CoV-2 by FDA under an Emergency Use Authorization (EUA). This EUA will remain in effect (meaning this test can be used) for the duration of the COVID-19 declaration under Section 564(b)(1) of the Act, 21 U.S.C. section 360bbb-3(b)(1), unless the authorization is terminated or revoked.  Performed at [facility], [address] [place], [place] [address]   Resp Panel by RT-PCR (Flu A&B, Covid) Nasopharyngeal Swab     Status: None   Collection Time: 04/12/21  3:26 AM   Specimen: Nasopharyngeal Swab; Nasopharyngeal(NP) swabs in vial transport medium  Result Value Ref Range Status   SARS Coronavirus 2 by RT PCR NEGATIVE NEGATIVE Final    Comment: (NOTE) SARS-CoV-2  target nucleic acids are NOT DETECTED.  The SARS-CoV-2 RNA is generally detectable in upper respiratory specimens during the acute phase of infection. The lowest concentration of SARS-CoV-2 viral copies this assay can detect is 138 copies/mL. A negative result does not preclude SARS-Cov-2 infection and should not be used as the sole basis for treatment or other patient management decisions. A negative result may occur with  improper specimen collection/handling, submission of specimen other than nasopharyngeal swab, presence of viral mutation(s) within the areas targeted by this assay, and inadequate number of viral copies(<138 copies/mL). A negative result must be combined with clinical observations, patient history, and epidemiological information. The expected result is Negative.  Fact Sheet for Patients:  EntrepreneurPulse.com.au  Fact Sheet for Healthcare Providers:  IncredibleEmployment.be  This test is no t yet approved or cleared by the [place] FDA and  has been authorized for detection and/or diagnosis of SARS-CoV-2 by FDA under an Emergency Use Authorization (EUA). This EUA will remain  in effect (meaning this test can be used) for the duration of the COVID-19 declaration under Section 564(b)(1) of the Act, 21 U.S.C.section 360bbb-3(b)(1), unless the authorization is terminated  or revoked sooner.       Influenza A by PCR NEGATIVE NEGATIVE Final   Influenza B by PCR NEGATIVE NEGATIVE Final    Comment: (NOTE) The Xpert Xpress SARS-CoV-2/FLU/RSV plus assay is intended as an aid in the diagnosis of influenza from Nasopharyngeal swab specimens and should not be used as a sole basis for treatment. Nasal washings and aspirates are unacceptable for Xpert Xpress SARS-CoV-2/FLU/RSV testing.  Fact Sheet for Patients: EntrepreneurPulse.com.au  Fact Sheet for Healthcare  Providers: IncredibleEmployment.be  This test is not yet approved or cleared by the [place] FDA and has been authorized for detection and/or diagnosis of SARS-CoV-2 by FDA under an Emergency Use Authorization (EUA). This EUA will remain in effect (meaning this test can be used) for the duration of the COVID-19 declaration under Section 564(b)(1) of the Act, 21 U.S.C. section 360bbb-3(b)(1), unless the authorization is terminated or revoked.  Performed at [facility], [address], [place],  [address]           Radiology Studies: No results found.      Scheduled Meds:  enoxaparin (LOVENOX) injection  40 mg Subcutaneous Q24H   irbesartan  150 mg Oral Daily   pantoprazole (PROTONIX) IV  40 mg Intravenous Q12H   potassium chloride  40 mEq Oral BID   Continuous Infusions:  sodium chloride 0.45 % with kcl 75 mL/hr at 04/14/21 0247          Aline August, MD [facility] 04/14/2021, 8:56  AM

## 2021-04-14 NOTE — Plan of Care (Signed)

## 2021-04-14 NOTE — Progress Notes (Signed)
The patient is receiving Protonix by the intravenous route.  Based on criteria approved by the Pharmacy and [place], the medication is being converted to the equivalent oral dose form.  These criteria include: -No active GI bleeding -Able to tolerate diet of full liquids (or better) or tube feeding -Able to tolerate other medications by the oral or enteral route  If you have any questions about this conversion, please contact the Pharmacy Department (phone [phone]).  Thank you.   Minda Ditto PharmD 04/14/2021, 2:25 PM

## 2021-04-15 LAB — FOLATE: Folate: 11.9 ng/mL (ref >5.9)

## 2021-04-15 LAB — MAGNESIUM: Magnesium: 1.8 mg/dL (ref 1.7–2.4)

## 2021-04-15 LAB — COMPREHENSIVE METABOLIC PANEL
ALT: 35 U/L (ref 0–44)
AST: 20 U/L (ref 15–41)
Albumin: 3.7 g/dL (ref 3.5–5.0)
Alkaline Phosphatase: 61 U/L (ref 38–126)
Anion gap: 9 (ref 5–15)
BUN: 18 mg/dL (ref 8–23)
CO2: 24 mmol/L (ref 22–32)
Calcium: 9.1 mg/dL (ref 8.9–10.3)
Chloride: 101 mmol/L (ref 98–111)
Creatinine, Ser: 0.56 mg/dL (ref 0.44–1.00)
GFR, Estimated: >60 mL/min (ref >60)
Glucose, Bld: 132 mg/dL — ABNORMAL HIGH (ref 70–99)
Potassium: 3.7 mmol/L (ref 3.5–5.1)
Sodium: 134 mmol/L — ABNORMAL LOW (ref 135–145)
Total Bilirubin: 1.3 mg/dL — ABNORMAL HIGH (ref 0.3–1.2)
Total Protein: 6.3 g/dL — ABNORMAL LOW (ref 6.5–8.1)

## 2021-04-15 LAB — VITAMIN B12: Vitamin B-12: 324 pg/mL (ref 180–914)

## 2021-04-15 LAB — TSH: TSH: 0.13 u[IU]/mL — ABNORMAL LOW (ref 0.350–4.500)

## 2021-04-15 LAB — AMMONIA: Ammonia: 30 umol/L (ref 9–35)

## 2021-04-15 MED ORDER — AMLODIPINE BESYLATE 5 MG PO TABS
5.0000 mg | ORAL_TABLET | Freq: Every day | ORAL | Status: DC
  Administered 2021-04-15: 5 mg via ORAL
  Filled 2021-04-15: qty 1

## 2021-04-15 MED ORDER — ALUM & MAG HYDROXIDE-SIMETH 200-200-20 MG/5ML PO SUSP: 15.0000 mL | ORAL | 0 refills | Status: DC | PRN

## 2021-04-15 MED ORDER — AMLODIPINE BESYLATE 5 MG PO TABS: 5.0000 mg | ORAL_TABLET | Freq: Every day | ORAL | 0 refills | Status: DC

## 2021-04-15 MED ORDER — OLMESARTAN MEDOXOMIL 20 MG PO TABS: 20.0000 mg | ORAL_TABLET | Freq: Every day | ORAL | 0 refills | Status: DC

## 2021-04-15 MED ORDER — ONDANSETRON 4 MG PO TBDP: 4.0000 mg | ORAL_TABLET | Freq: Three times a day (TID) | ORAL | 0 refills | Status: DC | PRN

## 2021-04-15 MED ORDER — POTASSIUM CHLORIDE CRYS ER 20 MEQ PO TBCR: 20.0000 meq | EXTENDED_RELEASE_TABLET | Freq: Every day | ORAL | 0 refills | Status: DC

## 2021-04-15 MED ORDER — PANTOPRAZOLE SODIUM 40 MG PO TBEC: 40.0000 mg | DELAYED_RELEASE_TABLET | Freq: Two times a day (BID) | ORAL | 0 refills | Status: DC

## 2021-04-15 NOTE — Discharge Summary (Addendum)
Physician Discharge Summary  [name]:[id number] DOB: 1946/03/06 DOA: 04/11/2021  PCP: [name], MD  Admit date: 04/11/2021 Discharge date: 04/15/2021  Admitted From: Home Disposition: Home  Recommendations for Outpatient Follow-up:  Follow up with PCP in 1 week with repeat CBC/CMP Recommend outpatient evaluation and follow-up by GI/Dr. Paulita Fujita; neurology and oncology Follow up in ED if symptoms worsen or new appear   Home Health: HHPT Equipment/Devices: None  Discharge Condition: Stable CODE STATUS: Full Diet recommendation: Heart healthy  Brief/Interim Summary: 75 year old female with history of hyperlipidemia, neuroendocrine tumor presented with abdominal pain with multiple episodes of nausea and vomiting.  On presentation, lipase and LFTs were normal.  Potassium was 2.6.  She was started on IV fluids.  During the hospitalizing, her symptoms have gradually improved.  Potassium level has normalized.  She is tolerating soft diet.  She is showing some signs of some confusion; CT of the head was negative for acute abnormality.  She would benefit from outpatient neurology evaluation and follow-up.  She is ambulating well.  She will be discharged home with outpatient follow-up with PCP.  Discharge Diagnoses:   Intractable nausea and vomiting -Questionable cause.  Lipase and LFTs normal on presentation. -Treated with antiemetics and IV fluids along with PPI. -Diet has been advanced to soft diet.  She is tolerating soft diet.  Still has some mild nausea.  -Discharged home with oral Zofran as needed.  Continue PPI on discharge along with as needed Maalox.  -Recommend outpatient evaluation and follow-up by GI (she had EUS done by Dr. Paulita Fujita in the past)   hypokalemia -Improved.  Treated with replacement during the hospitalization.  Continue replacement as an outpatient.  Outpatient follow-up of BMP.  Confusion/acute metabolic encephalopathy -Patient is slightly confused  to time.  Unclear if she has undiagnosed underlying dementia as well.  TSH is low, this needs to be repeated as an outpatient.  Ammonia normal.  Vitamin B12 level normal. -Recommend outpatient evaluation and follow-up by neurology.   Hypertension -Blood pressure elevated.  Hydrochlorothiazide on hold.  Amlodipine added.  Continue amlodipine on discharge along with olmesartan.  Outpatient follow-up.    History of neuroendocrine tumor -Son tells me that patient was advised to have surgery for the same but she refused.  She has not followed up with anyone regarding the same.  Recommend outpatient follow-up with oncology and GI.  Discharge Instructions  Discharge Instructions     Ambulatory referral to Hematology / Oncology   Complete by: As directed    Ambulatory referral to Neurology   Complete by: As directed    An appointment is requested in approximately: 1-2 weeks for mental status changes   Diet - low sodium heart healthy   Complete by: As directed    Increase activity slowly   Complete by: As directed       Allergies as of 04/15/2021       Reactions   Dilaudid [hydromorphone Hcl] Itching   Morphine And Related Nausea And Vomiting        Medication List     STOP taking these medications    COLD & FLU NIGHTTIME PO   diphenhydrAMINE 25 MG tablet Commonly known as: SOMINEX   gabapentin 300 MG capsule Commonly known as: NEURONTIN   olmesartan-hydrochlorothiazide 20-12.5 MG tablet Commonly known as: BENICAR HCT   potassium chloride 10 MEQ tablet Commonly known as: KLOR-CON   predniSONE 20 MG tablet Commonly known as: DELTASONE   prochlorperazine 5 MG tablet Commonly  known as: COMPAZINE   promethazine 25 MG suppository Commonly known as: PHENERGAN   promethazine 25 MG tablet Commonly known as: PHENERGAN   tiZANidine 2 MG tablet Commonly known as: ZANAFLEX   traMADol 50 MG tablet Commonly known as: ULTRAM       TAKE these medications     acetaminophen 325 MG tablet Commonly known as: TYLENOL Take 650 mg by mouth every 6 (six) hours as needed for mild pain or moderate pain.   Advair HFA 115-21 MCG/ACT inhaler Generic drug: fluticasone-salmeterol Inhale 2 puffs into the lungs 2 (two) times daily.   albuterol 108 (90 Base) MCG/ACT inhaler Commonly known as: VENTOLIN HFA Inhale 2 puffs into the lungs every 4 (four) hours as needed for wheezing or shortness of breath. What changed: Another medication with the same name was removed. Continue taking this medication, and follow the directions you see here.   alum & mag hydroxide-simeth 200-200-20 MG/5ML suspension Commonly known as: MAALOX/MYLANTA Take 15 mLs by mouth every 4 (four) hours as needed for indigestion or heartburn.   amLODipine 5 MG tablet Commonly known as: NORVASC Take 1 tablet (5 mg total) by mouth daily. Start taking on: April 16, 2021   atorvastatin 20 MG tablet Commonly known as: LIPITOR Take 1 tablet (20 mg total) by mouth daily at 6 PM. What changed: when to take this   fenofibrate 145 MG tablet Commonly known as: TRICOR Take 1 tablet (145 mg total) by mouth daily.   Flutter Devi 1 each by Does not apply route 2 (two) times daily.   ibuprofen 100 MG tablet Commonly known as: ADVIL Take 200 mg by mouth every 6 (six) hours as needed for pain.   Mucinex DM Maximum Strength 60-1200 MG Tb12 Take 1 tablet by mouth daily as needed (chest congestion). What changed: Another medication with the same name was removed. Continue taking this medication, and follow the directions you see here.   olmesartan 20 MG tablet Commonly known as: BENICAR Take 1 tablet (20 mg total) by mouth daily.   ondansetron 4 MG disintegrating tablet Commonly known as: Zofran ODT Take 1 tablet (4 mg total) by mouth every 8 (eight) hours as needed for nausea or vomiting. What changed: Another medication with the same name was removed. Continue taking this medication, and  follow the directions you see here.   oxymetazoline 0.05 % nasal spray Commonly known as: AFRIN Place 1 spray into both nostrils daily as needed for congestion.   pantoprazole 40 MG tablet Commonly known as: PROTONIX Take 1 tablet (40 mg total) by mouth 2 (two) times daily.   potassium chloride SA 20 MEQ tablet Commonly known as: KLOR-CON Take 1 tablet (20 mEq total) by mouth daily for 14 days.        Follow-up Information     [name], MD. Schedule an appointment as soon as possible for a visit in 1 week(s).   Specialties: Family Medicine, Sports Medicine Why: with repeat cbc/bmp Contact information: [place] [address] [phone]         Arta Silence, MD. Schedule an appointment as soon as possible for a visit in 1 week(s).   Specialty: Gastroenterology Contact information: [address] [place] [address] [phone]                Allergies  Allergen Reactions   Dilaudid [Hydromorphone Hcl] Itching   Morphine And Related Nausea And Vomiting    Consultations: None  Procedures/Studies: CT HEAD WO CONTRAST  Result Date: 04/14/2021 CLINICAL DATA:  Delirium. EXAM: CT HEAD WITHOUT CONTRAST TECHNIQUE: Contiguous axial images were obtained from the base of the skull through the vertex without intravenous contrast. COMPARISON:  None. FINDINGS: Brain: No evidence of acute infarction, hemorrhage, hydrocephalus, extra-axial collection or mass lesion/mass effect. Age related atrophy with mild periventricular and deep chronic small vessel ischemia. Vascular: Atherosclerosis of skullbase vasculature without hyperdense vessel or abnormal calcification. Skull: No fracture or focal lesion. Sinuses/Orbits: Fluid levels within both maxillary sinuses with mild mucosal thickening. Retained mucus within the right and left side of sphenoid sinus. Minimal opacification of lower right mastoid air cells. No acute orbital findings.  Other: None. IMPRESSION: 1. No acute intracranial abnormality. 2. Age related atrophy and chronic small vessel ischemia. 3. Bilateral maxillary and sphenoid sinus fluid levels, may be acute or chronic sinusitis. Electronically Signed   By: Keith Rake M.D.   On: 04/14/2021 18:42   DG Chest Portable 1 View  Result Date: 04/12/2021 CLINICAL DATA:  Weakness EXAM: PORTABLE CHEST 1 VIEW COMPARISON:  04/10/2021 FINDINGS: Lungs are clear. No pneumothorax or pleural effusion. Cardiac size within normal limits. Pulmonary vascularity is normal. Multiple remote right rib fractures with deformity of the right thoracic cage are again identified. Advanced degenerative changes are noted within the right shoulder. Left shoulder shoulder arthroplasty has been performed. IMPRESSION: No active disease. Electronically Signed   By: Fidela Salisbury MD   On: 04/12/2021 03:35   DG Chest Portable 1 View  Result Date: 04/10/2021 CLINICAL DATA:  Cough, vomiting EXAM: PORTABLE CHEST 1 VIEW COMPARISON:  01/27/2021 FINDINGS: Lungs are well expanded, symmetric, and clear. No pneumothorax or pleural effusion. Cardiac size within normal limits. Pulmonary vascularity is normal. Degenerative changes seen within the right shoulder. Left total shoulder arthroplasty has been performed. No acute bone abnormality. IMPRESSION: No active disease. Electronically Signed   By: Fidela Salisbury MD   On: 04/10/2021 04:04      Subjective: Patient seen and examined at bedside.  Slow to respond, feels okay to go home today.  Poor historian.  No overnight fever, vomiting or worsening shortness of breath reported.  Discharge Exam: Vitals:   04/15/21 0531 04/15/21 0622  BP: (!) 191/98 (!) 173/76  Pulse: 85 94  Resp: 20   Temp: 98.7 F (37.1 C)   SpO2: 98% 96%    General: Pt is awake, looks chronically deconditioned.  Currently on room air.  Slightly confused to time.   Cardiovascular: rate controlled, S1/S2 + Respiratory: bilateral  decreased breath sounds at bases Abdominal: Soft, NT, ND, bowel sounds + Extremities: no edema, no cyanosis    The results of significant diagnostics from this hospitalization (including imaging, microbiology, ancillary and laboratory) are listed below for reference.     Microbiology: Recent Results (from the past 240 hour(s))  Resp Panel by RT-PCR (Flu A&B, Covid) Nasopharyngeal Swab     Status: None   Collection Time: 04/10/21  3:43 AM   Specimen: Nasopharyngeal Swab; Nasopharyngeal(NP) swabs in vial transport medium  Result Value Ref Range Status   SARS Coronavirus 2 by RT PCR NEGATIVE NEGATIVE Final    Comment: (NOTE) SARS-CoV-2 target nucleic acids are NOT DETECTED.  The SARS-CoV-2 RNA is generally detectable in upper respiratory specimens during the acute phase of infection. The lowest concentration of SARS-CoV-2 viral copies this assay can detect is 138 copies/mL. A negative result does not preclude SARS-Cov-2 infection and should not be used as the sole basis for  treatment or other patient management decisions. A negative result may occur with  improper specimen collection/handling, submission of specimen other than nasopharyngeal swab, presence of viral mutation(s) within the areas targeted by this assay, and inadequate number of viral copies(<138 copies/mL). A negative result must be combined with clinical observations, patient history, and epidemiological information. The expected result is Negative.  Fact Sheet for Patients:  EntrepreneurPulse.com.au  Fact Sheet for Healthcare Providers:  IncredibleEmployment.be  This test is no t yet approved or cleared by the [place] FDA and  has been authorized for detection and/or diagnosis of SARS-CoV-2 by FDA under an Emergency Use Authorization (EUA). This EUA will remain  in effect (meaning this test can be used) for the duration of the COVID-19 declaration under Section 564(b)(1)  of the Act, 21 U.S.C.section 360bbb-3(b)(1), unless the authorization is terminated  or revoked sooner.       Influenza A by PCR NEGATIVE NEGATIVE Final   Influenza B by PCR NEGATIVE NEGATIVE Final    Comment: (NOTE) The Xpert Xpress SARS-CoV-2/FLU/RSV plus assay is intended as an aid in the diagnosis of influenza from Nasopharyngeal swab specimens and should not be used as a sole basis for treatment. Nasal washings and aspirates are unacceptable for Xpert Xpress SARS-CoV-2/FLU/RSV testing.  Fact Sheet for Patients: EntrepreneurPulse.com.au  Fact Sheet for Healthcare Providers: IncredibleEmployment.be  This test is not yet approved or cleared by the [place] FDA and has been authorized for detection and/or diagnosis of SARS-CoV-2 by FDA under an Emergency Use Authorization (EUA). This EUA will remain in effect (meaning this test can be used) for the duration of the COVID-19 declaration under Section 564(b)(1) of the Act, 21 U.S.C. section 360bbb-3(b)(1), unless the authorization is terminated or revoked.  Performed at [facility], [address] [place], [place] [address]   Resp Panel by RT-PCR (Flu A&B, Covid) Nasopharyngeal Swab     Status: None   Collection Time: 04/12/21  3:26 AM   Specimen: Nasopharyngeal Swab; Nasopharyngeal(NP) swabs in vial transport medium  Result Value Ref Range Status   SARS Coronavirus 2 by RT PCR NEGATIVE NEGATIVE Final    Comment: (NOTE) SARS-CoV-2 target nucleic acids are NOT DETECTED.  The SARS-CoV-2 RNA is generally detectable in upper respiratory specimens during the acute phase of infection. The lowest concentration of SARS-CoV-2 viral copies this assay can detect is 138 copies/mL. A negative result does not preclude SARS-Cov-2 infection and should not be used as the sole basis for treatment or other patient management decisions. A negative result may occur with  improper specimen  collection/handling, submission of specimen other than nasopharyngeal swab, presence of viral mutation(s) within the areas targeted by this assay, and inadequate number of viral copies(<138 copies/mL). A negative result must be combined with clinical observations, patient history, and epidemiological information. The expected result is Negative.  Fact Sheet for Patients:  EntrepreneurPulse.com.au  Fact Sheet for Healthcare Providers:  IncredibleEmployment.be  This test is no t yet approved or cleared by the [place] FDA and  has been authorized for detection and/or diagnosis of SARS-CoV-2 by FDA under an Emergency Use Authorization (EUA). This EUA will remain  in effect (meaning this test can be used) for the duration of the COVID-19 declaration under Section 564(b)(1) of the Act, 21 U.S.C.section 360bbb-3(b)(1), unless the authorization is terminated  or revoked sooner.       Influenza A by PCR NEGATIVE NEGATIVE Final   Influenza B by PCR NEGATIVE NEGATIVE Final  Comment: (NOTE) The Xpert Xpress SARS-CoV-2/FLU/RSV plus assay is intended as an aid in the diagnosis of influenza from Nasopharyngeal swab specimens and should not be used as a sole basis for treatment. Nasal washings and aspirates are unacceptable for Xpert Xpress SARS-CoV-2/FLU/RSV testing.  Fact Sheet for Patients: EntrepreneurPulse.com.au  Fact Sheet for Healthcare Providers: IncredibleEmployment.be  This test is not yet approved or cleared by the [place] FDA and has been authorized for detection and/or diagnosis of SARS-CoV-2 by FDA under an Emergency Use Authorization (EUA). This EUA will remain in effect (meaning this test can be used) for the duration of the COVID-19 declaration under Section 564(b)(1) of the Act, 21 U.S.C. section 360bbb-3(b)(1), unless the authorization is terminated or revoked.  Performed at [facility], [address] [place], [place] [address]      Labs: BNP (last 3 results) Recent Labs    01/27/21 0946  BNP 92.4   Basic Metabolic Panel: Recent Labs  Lab 04/11/21 2356 04/12/21 1552 04/13/21 0511 04/14/21 0435 04/15/21 0928  NA 139 141 140 139 134*  K 2.6* 2.7* 2.9* 3.8 3.7  CL 92* 99 100 101 101  CO2 29 28 31 29 24   GLUCOSE 166* 140* 138* 124* 132*  BUN 32* 31* 33* 26* 18  CREATININE 0.87 0.62 0.50 0.60 0.56  CALCIUM 10.4* 9.6 9.2 9.3 9.1  MG 2.3  --  2.2 2.0 1.8   Liver Function Tests: Recent Labs  Lab 04/11/21 2356 04/14/21 0435 04/15/21 0928  AST 34 28 20  ALT 38 42 35  ALKPHOS 88 61 61  BILITOT 1.4* 1.3* 1.3*  PROT 8.7* 6.3* 6.3*  ALBUMIN 5.3* 3.7 3.7   Recent Labs  Lab 04/11/21 2356  LIPASE 28   Recent Labs  Lab 04/15/21 0928  AMMONIA 30   CBC: Recent Labs  Lab 04/10/21 0405 04/11/21 2356 04/13/21 0511 04/14/21 0435  WBC 10.6* 13.9* 9.1 9.1  NEUTROABS 7.6 11.9*  --  5.9  HGB 13.8 15.3* 12.3 12.3  HCT 39.7 43.7 37.2 37.3  MCV 95.2 94.6 98.9 99.7  PLT 286 343 265 242   Cardiac Enzymes: No results for input(s): CKTOTAL, CKMB, CKMBINDEX, TROPONINI in the last 168 hours. BNP: Invalid input(s): POCBNP CBG: No results for input(s): GLUCAP in the last 168 hours. D-Dimer No results for input(s): DDIMER in the last 72 hours. Hgb A1c No results for input(s): HGBA1C in the last 72 hours. Lipid Profile No results for input(s): CHOL, HDL, LDLCALC, TRIG, CHOLHDL, LDLDIRECT in the last 72 hours. Thyroid function studies Recent Labs    04/15/21 0928  TSH 0.130*   Anemia work up Recent Labs    04/15/21 0928  VITAMINB12 324  FOLATE 11.9   Urinalysis    Component Value Date/Time   COLORURINE YELLOW 04/11/2021 2356   APPEARANCEUR HAZY (A) 04/11/2021 2356   LABSPEC >1.030 (H) 04/11/2021 2356   PHURINE 6.0 04/11/2021 2356   GLUCOSEU NEGATIVE 04/11/2021 2356   HGBUR MODERATE (A) 04/11/2021 2356   BILIRUBINUR  MODERATE (A) 04/11/2021 2356   BILIRUBINUR negative 12/31/2016 1145   KETONESUR 15 (A) 04/11/2021 2356   PROTEINUR 100 (A) 04/11/2021 2356   UROBILINOGEN 0.2 12/31/2016 1145   UROBILINOGEN 0.2 03/14/2012 0612   NITRITE NEGATIVE 04/11/2021 2356   LEUKOCYTESUR NEGATIVE 04/11/2021 2356   Sepsis Labs Invalid input(s): PROCALCITONIN,  WBC,  LACTICIDVEN Microbiology Recent Results (from the past 240 hour(s))  Resp Panel by RT-PCR (Flu A&B, Covid) Nasopharyngeal Swab  Status: None   Collection Time: 04/10/21  3:43 AM   Specimen: Nasopharyngeal Swab; Nasopharyngeal(NP) swabs in vial transport medium  Result Value Ref Range Status   SARS Coronavirus 2 by RT PCR NEGATIVE NEGATIVE Final    Comment: (NOTE) SARS-CoV-2 target nucleic acids are NOT DETECTED.  The SARS-CoV-2 RNA is generally detectable in upper respiratory specimens during the acute phase of infection. The lowest concentration of SARS-CoV-2 viral copies this assay can detect is 138 copies/mL. A negative result does not preclude SARS-Cov-2 infection and should not be used as the sole basis for treatment or other patient management decisions. A negative result may occur with  improper specimen collection/handling, submission of specimen other than nasopharyngeal swab, presence of viral mutation(s) within the areas targeted by this assay, and inadequate number of viral copies(<138 copies/mL). A negative result must be combined with clinical observations, patient history, and epidemiological information. The expected result is Negative.  Fact Sheet for Patients:  EntrepreneurPulse.com.au  Fact Sheet for Healthcare Providers:  IncredibleEmployment.be  This test is no t yet approved or cleared by the [place] FDA and  has been authorized for detection and/or diagnosis of SARS-CoV-2 by FDA under an Emergency Use Authorization (EUA). This EUA will remain  in effect (meaning this test can  be used) for the duration of the COVID-19 declaration under Section 564(b)(1) of the Act, 21 U.S.C.section 360bbb-3(b)(1), unless the authorization is terminated  or revoked sooner.       Influenza A by PCR NEGATIVE NEGATIVE Final   Influenza B by PCR NEGATIVE NEGATIVE Final    Comment: (NOTE) The Xpert Xpress SARS-CoV-2/FLU/RSV plus assay is intended as an aid in the diagnosis of influenza from Nasopharyngeal swab specimens and should not be used as a sole basis for treatment. Nasal washings and aspirates are unacceptable for Xpert Xpress SARS-CoV-2/FLU/RSV testing.  Fact Sheet for Patients: EntrepreneurPulse.com.au  Fact Sheet for Healthcare Providers: IncredibleEmployment.be  This test is not yet approved or cleared by the [place] FDA and has been authorized for detection and/or diagnosis of SARS-CoV-2 by FDA under an Emergency Use Authorization (EUA). This EUA will remain in effect (meaning this test can be used) for the duration of the COVID-19 declaration under Section 564(b)(1) of the Act, 21 U.S.C. section 360bbb-3(b)(1), unless the authorization is terminated or revoked.  Performed at [facility], [address] [place], [place] [address]   Resp Panel by RT-PCR (Flu A&B, Covid) Nasopharyngeal Swab     Status: None   Collection Time: 04/12/21  3:26 AM   Specimen: Nasopharyngeal Swab; Nasopharyngeal(NP) swabs in vial transport medium  Result Value Ref Range Status   SARS Coronavirus 2 by RT PCR NEGATIVE NEGATIVE Final    Comment: (NOTE) SARS-CoV-2 target nucleic acids are NOT DETECTED.  The SARS-CoV-2 RNA is generally detectable in upper respiratory specimens during the acute phase of infection. The lowest concentration of SARS-CoV-2 viral copies this assay can detect is 138 copies/mL. A negative result does not preclude SARS-Cov-2 infection and should not be used as the sole basis for treatment or other  patient management decisions. A negative result may occur with  improper specimen collection/handling, submission of specimen other than nasopharyngeal swab, presence of viral mutation(s) within the areas targeted by this assay, and inadequate number of viral copies(<138 copies/mL). A negative result must be combined with clinical observations, patient history, and epidemiological information. The expected result is Negative.  Fact Sheet for Patients:  EntrepreneurPulse.com.au  Fact Sheet for Healthcare Providers:  IncredibleEmployment.be  This test is no t yet approved or cleared by the [place] and  has been authorized for detection and/or diagnosis of SARS-CoV-2 by FDA under an Emergency Use Authorization (EUA). This EUA will remain  in effect (meaning this test can be used) for the duration of the COVID-19 declaration under Section 564(b)(1) of the Act, 21 U.S.C.section 360bbb-3(b)(1), unless the authorization is terminated  or revoked sooner.       Influenza A by PCR NEGATIVE NEGATIVE Final   Influenza B by PCR NEGATIVE NEGATIVE Final    Comment: (NOTE) The Xpert Xpress SARS-CoV-2/FLU/RSV plus assay is intended as an aid in the diagnosis of influenza from Nasopharyngeal swab specimens and should not be used as a sole basis for treatment. Nasal washings and aspirates are unacceptable for Xpert Xpress SARS-CoV-2/FLU/RSV testing.  Fact Sheet for Patients: EntrepreneurPulse.com.au  Fact Sheet for Healthcare Providers: IncredibleEmployment.be  This test is not yet approved or cleared by the [place] FDA and has been authorized for detection and/or diagnosis of SARS-CoV-2 by FDA under an Emergency Use Authorization (EUA). This EUA will remain in effect (meaning this test can be used) for the duration of the COVID-19 declaration under Section 564(b)(1) of the Act, 21 U.S.C. section  360bbb-3(b)(1), unless the authorization is terminated or revoked.  Performed at [facility], [address] [place], [place] [address]      Time coordinating discharge: 35 minutes  SIGNED:   Aline August, MD  [facility] 04/15/2021, 11:14 AM

## 2021-04-15 NOTE — TOC Transition Note (Signed)
Transition of Care [facility]) - CM/SW Discharge Note   Patient Details  Name: [name] MRN: [id number] Date of Birth: June 05, 1946  Transition of Care [facility]) CM/SW Contact:  Ross Ludwig, LCSW Phone Number: 04/15/2021, 12:33 PM   Clinical Narrative:     Patient's son [name] is interested in possible placement in ALF or SNF in the future from patient's home.  Son did not have any preference for [facility] agency.  CSW contacted [facility], they can accept patient for [facility] services.  Patient will be going home with home health PT and Social work through [employer].  CSW signing off please reconsult with any other social work needs, home health agency has been notified of planned discharge.  Patient's son would like to be the main contact for patient, CSW relayed information to [facility] agency.    Final next level of care: Home w Home Health Services Barriers to Discharge: Barriers Resolved   Patient Goals and CMS Choice   CMS Medicare.gov Compare Post Acute Care list provided to:: Patient Represenative (must comment) Choice offered to / list presented to : Adult Children  Discharge Placement  Home with home health services.                     Discharge Plan and Services                          HH Arranged: PT, Social Work [facility] Agency: [facility] Date [place]: 04/15/21 Time [place]: 1233 Representative spoke with at [place]: [place] Interventions     Readmission Risk Interventions No flowsheet data found.

## 2021-04-24 ENCOUNTER — Telehealth: Payer: Self-pay

## 2021-04-24 NOTE — Telephone Encounter (Signed)
[name] with  [facility] called to report pt is refusing all services at this time.
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
## 2021-04-25 NOTE — Telephone Encounter (Signed)
Called patient's number but unable to leave a message because mailbox is full. Called Jacki Cones, patient's grandson and left a message for him to call back so we could get the patient scheduled. Called Richardson Landry back to inform. He states he will also try and get a message to his niece to try and get an appointment scheduled.
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
## 2021-04-28 NOTE — Telephone Encounter (Signed)
Please check status - call grandson or patient to check on scheduling hospital follow up and how she is doing.

## 2021-04-28 NOTE — Telephone Encounter (Signed)
Pt did not answer VM still full, called son Richardson Landry, who reports he will reach out in attempts to get her to schedule but reports she has not been responsive to his messages and that she has been difficult to direct in regard to her health

## 2021-05-01 ENCOUNTER — Inpatient Hospital Stay: Admission: RE | Admit: 2021-05-01 | Payer: Medicare Other | Source: Ambulatory Visit

## 2021-05-02 NOTE — Telephone Encounter (Signed)
Called again and still no answer from Ms Vanderlinden, unable to contact [place] last week as Janett Billow C had previously. Called Richardson Landry asked that he again reach out -he has been unsuccessful in reaching her since our last discussion

## 2021-05-14 NOTE — Telephone Encounter (Signed)
[name],  We have been trying to get in touch with you to check and see how you are doing since your hospitalization and schedule a hospital follow-up.  Please call our office as soon as possible so that we can schedule that appointment and let us know how you are doing.  Dr. Carlota Raspberry

## 2021-05-24 NOTE — Telephone Encounter (Signed)
Left another message no return

## 2021-05-29 ENCOUNTER — Ambulatory Visit (INDEPENDENT_AMBULATORY_CARE_PROVIDER_SITE_OTHER): Payer: Medicare Other | Admitting: Family Medicine

## 2021-05-29 ENCOUNTER — Other Ambulatory Visit: Payer: Self-pay

## 2021-05-29 ENCOUNTER — Encounter: Payer: Self-pay | Admitting: Family Medicine

## 2021-05-29 VITALS — BP 170/90 | HR 93 | Temp 98.0°F | Ht 65.5 in | Wt 157.4 lb

## 2021-05-29 DIAGNOSIS — I10 Essential (primary) hypertension: Secondary | ICD-10-CM

## 2021-05-29 DIAGNOSIS — R0602 Shortness of breath: Secondary | ICD-10-CM

## 2021-05-29 DIAGNOSIS — J4520 Mild intermittent asthma, uncomplicated: Secondary | ICD-10-CM

## 2021-05-29 DIAGNOSIS — J45991 Cough variant asthma: Secondary | ICD-10-CM

## 2021-05-29 DIAGNOSIS — L304 Erythema intertrigo: Secondary | ICD-10-CM

## 2021-05-29 DIAGNOSIS — E785 Hyperlipidemia, unspecified: Secondary | ICD-10-CM

## 2021-05-29 DIAGNOSIS — Z658 Other specified problems related to psychosocial circumstances: Secondary | ICD-10-CM | POA: Diagnosis not present

## 2021-05-29 DIAGNOSIS — E876 Hypokalemia: Secondary | ICD-10-CM

## 2021-05-29 DIAGNOSIS — E0590 Thyrotoxicosis, unspecified without thyrotoxic crisis or storm: Secondary | ICD-10-CM | POA: Diagnosis not present

## 2021-05-29 DIAGNOSIS — R7303 Prediabetes: Secondary | ICD-10-CM | POA: Diagnosis not present

## 2021-05-29 DIAGNOSIS — R7989 Other specified abnormal findings of blood chemistry: Secondary | ICD-10-CM | POA: Diagnosis not present

## 2021-05-29 DIAGNOSIS — R062 Wheezing: Secondary | ICD-10-CM | POA: Diagnosis not present

## 2021-05-29 LAB — HEMOGLOBIN A1C: Hgb A1c MFr Bld: 5.6 % (ref 4.6–6.5)

## 2021-05-29 LAB — COMPREHENSIVE METABOLIC PANEL
ALT: 10 U/L (ref 0–35)
AST: 14 U/L (ref 0–37)
Albumin: 4.6 g/dL (ref 3.5–5.2)
Alkaline Phosphatase: 88 U/L (ref 39–117)
BUN: 17 mg/dL (ref 6–23)
CO2: 25 mEq/L (ref 19–32)
Calcium: 9.6 mg/dL (ref 8.4–10.5)
Chloride: 104 mEq/L (ref 96–112)
Creatinine, Ser: 0.57 mg/dL (ref 0.40–1.20)
GFR: 88.64 mL/min (ref >60.00)
Glucose, Bld: 108 mg/dL — ABNORMAL HIGH (ref 70–99)
Potassium: 3 mEq/L — ABNORMAL LOW (ref 3.5–5.1)
Sodium: 142 mEq/L (ref 135–145)
Total Bilirubin: 0.7 mg/dL (ref 0.2–1.2)
Total Protein: 7.1 g/dL (ref 6.0–8.3)

## 2021-05-29 LAB — LIPID PANEL
Cholesterol: 201 mg/dL — ABNORMAL HIGH (ref 0–200)
HDL: 60.3 mg/dL (ref >39.00)
LDL Cholesterol: 106 mg/dL — ABNORMAL HIGH (ref 0–99)
NonHDL: 140.23
Total CHOL/HDL Ratio: 3
Triglycerides: 169 mg/dL — ABNORMAL HIGH (ref 0.0–149.0)
VLDL: 33.8 mg/dL (ref 0.0–40.0)

## 2021-05-29 LAB — TSH: TSH: 0.11 u[IU]/mL — ABNORMAL LOW (ref 0.35–5.50)

## 2021-05-29 MED ORDER — CLOTRIMAZOLE 1 % EX CREA: 1.0000 "application " | TOPICAL_CREAM | Freq: Two times a day (BID) | CUTANEOUS | 0 refills | Status: DC

## 2021-05-29 MED ORDER — ADVAIR HFA 115-21 MCG/ACT IN AERO: 2.0000 | INHALATION_SPRAY | Freq: Two times a day (BID) | RESPIRATORY_TRACT | 5 refills | Status: AC

## 2021-05-29 MED ORDER — ATORVASTATIN CALCIUM 20 MG PO TABS: 20.0000 mg | ORAL_TABLET | Freq: Every day | ORAL | 5 refills | Status: DC

## 2021-05-29 MED ORDER — ALBUTEROL SULFATE HFA 108 (90 BASE) MCG/ACT IN AERS: 2.0000 | INHALATION_SPRAY | RESPIRATORY_TRACT | 0 refills | Status: DC | PRN

## 2021-05-29 MED ORDER — AMLODIPINE BESYLATE 5 MG PO TABS: 5.0000 mg | ORAL_TABLET | Freq: Every day | ORAL | 3 refills | Status: DC

## 2021-05-29 MED ORDER — OLMESARTAN MEDOXOMIL 20 MG PO TABS: 20.0000 mg | ORAL_TABLET | Freq: Every day | ORAL | 3 refills | Status: DC

## 2021-05-29 NOTE — Progress Notes (Signed)
Subjective:  Patient ID: [name], female    DOB: 1946/03/13  Age: 75 y.o. MRN: [id number]  CC:  Chief Complaint  Patient presents with   Hospitalization Follow-up    HPI [name] presents for  Hospital follow-up, chronic medication/medical, follow-up.  Last visit with me was in October 2021.  Hospital follow-up Admitted June 28 through July 2 with nausea and vomiting, normal lipase, LFTs on presentation.  Hypokalemia with potassium 2.6.  Treated with IV fluids, PPI, Zofran.  Out patient GI follow-up discussed.  Still living with grandson, [name]. He does grocery shopping for her.  Feels safe at home, and feels like [name] has been helping her quite a bit. Some falling out with son [name]. Concern about her memory, and discussion about her going to a home. Son has not gotten along with [name].   Hypokalemia Treated with replacement during hospitalization, plan for outpatient potassium supplement. Took potassium for few days until ran out.  Lab Results  Component Value Date   K 3.7 04/15/2021   Cough: Since left hospital - no fever. Spitting up some clear mucus. Initially yellow. About the same.  Some dyspnea - all the time. Hx of cough variant asthma. Off advair since hospital.  Using albuterol daily every 4-5 hours.  No chest pain or swelling in legs. No fever.   Rash Bottom of stomach 3 weeks. Using salve, cortisone cream.   Confusion/metabolic encephalopathy Somewhat confused to time during her most recent hospitalization, question of underlying dementia.  TSH was low and plan for outpatient recheck.  Ammonia was normal and B12 was normal.  Outpatient neuro follow-up was also discussed. Gabapentin appears to have been discontinued during her hospitalization, but she has since restarted that medication.Low back pain with some pain in back of buttocks - will be seeing ortho Dr.Graves tomorrow.  Had social worker come to house and told her that everything is ok.   Denies use of any illicit substances.  Plan for memory testing next visit.   Lab Results  Component Value Date   TSH 0.130 (L) 04/15/2021     Hypertension: Hydrochlorothiazide was placed on hold during hospitalization with her nausea and vomiting, amlodipine was added.  Was continued on amlodipine as well as olmesartan at discharge. Currently off meds for a few weeks - ran out.  Home readings: BP Readings from Last 3 Encounters:  05/29/21 (!) 170/90  04/15/21 (!) 173/76  04/10/21 (!) 193/90   Lab Results  Component Value Date   CREATININE 0.56 04/15/2021     History of prediabetes, A1c normal in October 2021 Lab Results  Component Value Date   HGBA1C 5.4 08/12/2020    Hyperlipidemia: Lipitor 20 mg daily, fenofibrate 145 mg daily when discussed in October 2021, off fenofibrate, Lipitor only. 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  Last MRI in March.  Unchanged size of the 11 mm lesion in the body of pancreas.  Numerous cystic lesions throughout the parenchyma of pancreas.  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  History Patient Active Problem List   Diagnosis Date Noted   GERD (gastroesophageal reflux disease)    Hypokalemia    Intractable nausea and vomiting 01/27/2021   Prediabetes 02/14/2020   Essential hypertension 08/19/2017   Hyperlipidemia 08/19/2017   Cough variant asthma 04/04/2016   Upper airway cough syndrome 04/04/2016   Osteoarthritis of left shoulder 03/24/2012   Past Medical History:  Diagnosis Date   Arthritis    Asthma     Cancer ([place])    Pancreas   Complication of anesthesia    GERD (gastroesophageal reflux disease)    at times   Hyperlipidemia    Hypertension    Peripheral vascular disease ([place])    has spider veins   Pneumonia    Pneumothorax, traumatic    RIGHT; associated with multiple rib fractures after a fall   PONV (postoperative nausea and vomiting)    Pre-diabetes    Past Surgical History:  Procedure Laterality Date   ABDOMINAL HYSTERECTOMY  1982   AUGMENTATION MAMMAPLASTY Bilateral    Patient has bilateral implants    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   BREAST SURGERY     ESOPHAGOGASTRODUODENOSCOPY (EGD) WITH PROPOFOL N/A 01/05/2020   Procedure: ESOPHAGOGASTRODUODENOSCOPY (EGD) WITH PROPOFOL;  Surgeon: Arta Silence, MD;  Location: [facility];  Service: Endoscopy;  Laterality: N/A;   EUS N/A 01/05/2020   Procedure: UPPER ENDOSCOPIC ULTRASOUND (EUS) LINEAR;  Surgeon: Arta Silence, MD;  Location: [facility];  Service: Endoscopy;  Laterality: N/A;   FINE NEEDLE ASPIRATION N/A 01/05/2020   Procedure: FINE NEEDLE ASPIRATION (FNA) LINEAR;  Surgeon: Arta Silence, MD;  Location: [facility];  Service: Endoscopy;  Laterality: N/A;   JOINT REPLACEMENT  2009   right knee   JOINT REPLACEMENT  2011   left knee   MASTECTOMY  2008   right breast   REDUCTION MAMMAPLASTY Left    Patient had lift with implant on Left side    SHOULDER ARTHROSCOPY DISTAL CLAVICLE EXCISION AND OPEN ROTATOR CUFF REPAIR  2011   TOTAL SHOULDER ARTHROPLASTY  03/14/2012   Procedure: TOTAL SHOULDER ARTHROPLASTY;  Surgeon: Alta Corning, MD;  Location: [place];  Service: Orthopedics;  Laterality: Left;   Allergies  Allergen Reactions   Dilaudid [Hydromorphone Hcl] Itching   Morphine And Related Nausea And Vomiting   Prior to Admission medications   Medication Sig Start Date End Date Taking? Authorizing Provider  acetaminophen (TYLENOL) 325 MG tablet Take 650 mg by mouth every 6 (six) hours as needed for mild  pain or moderate pain.    Yes [provider]  albuterol (VENTOLIN HFA) 108 (90 Base) MCG/ACT inhaler Inhale 2 puffs into the lungs every 4 (four) hours as needed for wheezing or shortness of breath. 04/10/21  Yes Palumbo, April, MD  alum & mag hydroxide-simeth (MAALOX/MYLANTA) 200-200-20 MG/5ML suspension Take 15 mLs by mouth every 4 (four) hours as needed for indigestion or heartburn. 04/15/21  Yes Aline August, MD  amLODipine (NORVASC) 5 MG tablet Take 1 tablet (5 mg total) by mouth daily. 04/16/21  Yes Aline August, MD  atorvastatin (LIPITOR) 20 MG tablet Take 1 tablet (20 mg total) by mouth daily at 6 PM. 08/12/20  Yes [name], MD  Dextromethorphan-guaiFENesin [facility] DM MAXIMUM STRENGTH) 60-1200 MG TB12 Take 1 tablet by mouth daily as needed (chest congestion).   Yes [provider]  fenofibrate (TRICOR) 145 MG tablet Take 1 tablet (145 mg total) by mouth daily. 08/12/20  Yes [name],  MD  ibuprofen (ADVIL,MOTRIN) 100 MG tablet Take 200 mg by mouth every 6 (six) hours as needed for pain.   Yes [provider]  olmesartan (BENICAR) 20 MG tablet Take 1 tablet (20 mg total) by mouth daily. 04/15/21  Yes Aline August, MD  ondansetron (ZOFRAN ODT) 4 MG disintegrating tablet Take 1 tablet (4 mg total) by mouth every 8 (eight) hours as needed for nausea or vomiting. 04/15/21  Yes Aline August, MD  oxymetazoline (AFRIN) 0.05 % nasal spray Place 1 spray into both nostrils daily as needed for congestion.   Yes [provider]  pantoprazole (PROTONIX) 40 MG tablet Take 1 tablet (40 mg total) by mouth 2 (two) times daily. 04/15/21  Yes Aline August, MD  Respiratory Therapy Supplies (FLUTTER) DEVI 1 each by Does not apply route 2 (two) times daily. 07/15/19  Yes Julian Hy, DO  fluticasone-salmeterol (ADVAIR HFA) 115-21 MCG/ACT inhaler Inhale 2 puffs into the lungs 2 (two) times daily. Patient not taking: Reported on 05/29/2021 08/12/20   [name], MD  potassium chloride SA (KLOR-CON) 20 MEQ tablet Take 1 tablet (20 mEq total) by mouth daily for 14 days. 04/15/21 04/29/21  Aline August, MD   Social History   Socioeconomic History   Marital status: Divorced    Spouse name: Not on file   Number of children: 2   Years of education: Not on file   Highest education level: Not on file  Occupational History   Occupation: Retired   Occupation: Glass blower/designer    Comment: Arts development officer  Tobacco Use   Smoking status: Never   Smokeless tobacco: Never  Vaping Use   Vaping Use: Never used  Substance and Sexual Activity   Alcohol use: No    Alcohol/week: 0.0 standard drinks   Drug use: No   Sexual activity: Not on file  Other Topics Concern   Not on file  Social History Narrative   Not on file   Social Determinants of Health   Financial Resource Strain: Not on file  Food Insecurity: Not on file  Transportation Needs: Not on file  Physical Activity: Not on file  Stress: Not on file  Social Connections: Not on file  Intimate Partner Violence: Not on file    Review of Systems Per HPI  Objective:   Vitals:   05/29/21 1127  BP: (!) 170/90  Pulse: 93  Temp: 98 F (36.7 C)  TempSrc: Temporal  SpO2: 97%  Weight: 157 lb 6.4 oz (71.4 kg)  Height: 5' 5.5" (1.664 m)     Physical Exam Vitals reviewed.  Constitutional:      Appearance: Normal appearance. She is well-developed.  HENT:     Head: Normocephalic and atraumatic.  Eyes:     Conjunctiva/sclera: Conjunctivae normal.     Pupils: Pupils are equal, round, and reactive to light.  Neck:     Vascular: No carotid bruit.  Cardiovascular:     Rate and Rhythm: Normal rate and regular rhythm.     Heart sounds: Normal heart sounds.  Pulmonary:     Effort: Pulmonary effort is normal. No respiratory distress.     Breath sounds: Wheezing (diffuse) present.  Chest:     Chest wall: No tenderness.  Abdominal:     Palpations: Abdomen is soft. There is  no pulsatile mass.     Tenderness: There is no abdominal tenderness.  Musculoskeletal:     Right lower leg: No edema.     Left  lower leg: No edema.  Skin:    General: Skin is warm and dry.     Findings: Erythema (erythematous patch abelow abdominal pannus, no ulceration/wound.) present.  Neurological:     Mental Status: She is alert and oriented to person, place, and time.  Psychiatric:        Mood and Affect: Mood normal.        Behavior: Behavior normal.     53 minutes spent during visit, including chart review, hospital notes, counseling regarding home situation, discussion of multiple concerns as above, assimilation of information, exam, discussion of plan, and chart completion.   Assessment & Plan:  CHELSIA SWALLOWS is a 75 y.o. female . Hypokalemia - Plan: Comprehensive metabolic panel  -May have been related to previous nausea vomiting.  Off supplement for some time.  Check levels.  May also need to recheck once she restarts her antihypertensives.  Hyperlipidemia, unspecified hyperlipidemia type - Plan: atorvastatin (LIPITOR) 20 MG tablet, Comprehensive metabolic panel, Lipid panel  -Check baseline labs to determine if med changes needed, close follow-up.  Mild intermittent asthma without complication - Plan: fluticasone-salmeterol (ADVAIR HFA) 115-21 MCG/ACT inhaler Wheezing - Plan: fluticasone-salmeterol (ADVAIR HFA) 115-21 MCG/ACT inhaler, DG Chest 2 View, CANCELED: DG Chest 2 View Cough variant asthma - Plan: fluticasone-salmeterol (ADVAIR HFA) 115-21 MCG/ACT inhaler, albuterol (VENTOLIN HFA) 108 (90 Base) MCG/ACT inhaler, DG Chest 2 View, CANCELED: DG Chest 2 View Shortness of breath - Plan: Pro b natriuretic peptide, DG Chest 2 View, CANCELED: DG Chest 2 View  -Persistent cough as above without fevers.  Less likely infectious, suspect flare of asthma.  Check chest x-ray, restart Advair, albuterol if needed with ER precautions given.  Recheck 2 weeks.  Low TSH level - Plan:  TSH Hyperthyroidism - Plan: TSH  -Repeat TSH, possible hyperthyroidism  Essential hypertension - Plan: amLODipine (NORVASC) 5 MG tablet, olmesartan (BENICAR) 20 MG tablet  -Uncontrolled off meds, restart amlodipine, Benicar, then can decide if restart of HCTZ needed at follow-up visit in 2 weeks, ER precautions given.  Less likely CHF but will check chest x-ray as above with her shortness of breath, wheezing.  Check BNP as well.  Prediabetes - Plan: Hemoglobin A1c  Intertrigo - Plan: clotrimazole (LOTRIMIN) 1 % cream, intertrigo of pannus lower abdomen.  Start clotrimazole, handout given, recheck 2 weeks  Social discord Discussed current living situation, and current concerns with son.  Denies home safety concerns, reports social worker did visit without apparent concerns.  She feels safe at home and reports that her grandson has been assisting with making sure she takes her medications as well as taking her to the grocery store.  Option to meet with therapist for her and her son.  We will discuss further at next visit as well as memory screening at that time.  Expressed understanding today and no acute concerns noted on my exam other than as above   Meds ordered this encounter  Medications   amLODipine (NORVASC) 5 MG tablet    Sig: Take 1 tablet (5 mg total) by mouth daily.    Dispense:  30 tablet    Refill:  3   atorvastatin (LIPITOR) 20 MG tablet    Sig: Take 1 tablet (20 mg total) by mouth daily at 6 PM.    Dispense:  30 tablet    Refill:  5   fluticasone-salmeterol (ADVAIR HFA) 115-21 MCG/ACT inhaler    Sig: Inhale 2 puffs into the lungs 2 (two) times daily.  Dispense:  12 g    Refill:  5   olmesartan (BENICAR) 20 MG tablet    Sig: Take 1 tablet (20 mg total) by mouth daily.    Dispense:  30 tablet    Refill:  3   albuterol (VENTOLIN HFA) 108 (90 Base) MCG/ACT inhaler    Sig: Inhale 2 puffs into the lungs every 4 (four) hours as needed for wheezing or shortness of breath.     Dispense:  1 each    Refill:  0   clotrimazole (LOTRIMIN) 1 % cream    Sig: Apply 1 application topically 2 (two) times daily.    Dispense:  30 g    Refill:  0   Patient Instructions  I am happy to provide numbers for therapists to meet with family if needed.  Please let me know. Restart Advair for cough, albuterol if needed., please have chest x-ray done at [facility] facility. Restart telmisartan and amlodipine for blood pressure.  We will recheck those levels in the next 2 weeks. Clotrimazole cream twice per day to the rash on the stomach. Recheck with me in 2 weeks but please follow-up sooner if any new or worsening symptoms.  Return to the clinic or go to the nearest emergency room if any of your symptoms worsen or new symptoms occur.   Intertrigo Intertrigo is skin irritation or inflammation (dermatitis) that occurs when folds of skin rub together. The irritation can cause a rash and make skin raw and itchy. This condition most commonly occurs in the skin folds of these areas: Toes. Armpits. Groin. Under the belly. Under the breasts. Buttocks. Intertrigo is not passed from person to person (is not contagious). What are the causes? This condition is caused by heat, moisture, rubbing (friction), and not enough air circulation. The condition can be made worse by: Sweat. Bacteria. A fungus, such as yeast. What increases the risk? This condition is more likely to occur if you have moisture in your skin folds. You are more likely to develop this condition if you: Have diabetes. Are overweight. Are not able to move around or are not active. Live in a warm and moist climate. Wear splints, braces, or other medical devices. Are not able to control your bowels or bladder (have incontinence). What are the signs or symptoms? Symptoms of this condition include: A pink or red skin rash in the skin fold or near the skin fold. Raw or scaly skin. Itchiness. A burning  feeling. Bleeding. Leaking fluid. A bad smell. How is this diagnosed? This condition is diagnosed with a medical history and physical exam. You mayalso have a skin swab to test for bacteria or a fungus. How is this treated? This condition may be treated by: Cleaning and drying your skin. Taking an antibiotic medicine or using an antibiotic skin cream for a bacterial infection. Using an antifungal cream on your skin or taking pills for an infection that was caused by a fungus, such as yeast. Using a steroid ointment to relieve itchiness and irritation. Separating the skin fold with a clean cotton cloth to absorb moisture and allow air to flow into the area. Follow these instructions at home: Keep the affected area clean and dry. Do not scratch your skin. Stay in a cool environment as much as possible. Use an air conditioner or fan, if available. Apply over-the-counter and prescription medicines only as told by your health care provider. If you were prescribed an antibiotic medicine, use it as told by your  health care provider. Do not stop using the antibiotic even if your condition improves. Keep all follow-up visits as told by your health care provider. This is important. How is this prevented?  Maintain a healthy weight. Take care of your feet, especially if you have diabetes. Foot care includes: Wearing shoes that fit well. Keeping your feet dry. Wearing clean, breathable socks. Protect the skin around your groin and buttocks, especially if you have incontinence. Skin protection includes: Following a regular cleaning routine. Using skin protectant creams, powders, or ointments. Changing protection pads frequently. Do not wear tight clothes. Wear clothes that are loose, absorbent, and made of cotton. Wear a bra that gives good support, if needed. Shower and dry yourself well after activity or exercise. Use a hair dryer on a cool setting to dry between skin folds, especially after  you bathe. If you have diabetes, keep your blood sugar under control. Contact a health care provider if: Your symptoms do not improve with treatment. Your symptoms get worse or they spread. You notice increased redness and warmth. You have a fever. Summary Intertrigo is skin irritation or inflammation (dermatitis) that occurs when folds of skin rub together. This condition is caused by heat, moisture, rubbing (friction), and not enough air circulation. This condition may be treated by cleaning and drying your skin and with medicines. Apply over-the-counter and prescription medicines only as told by your health care provider. Keep all follow-up visits as told by your health care provider. This is important. This information is not intended to replace advice given to you by your health care provider. Make sure you discuss any questions you have with your healthcare provider. Document Revised: 02/24/2018 Document Reviewed: 03/03/2018 Elsevier Patient Education  2022 [place],   Merri Ray, MD [place] 05/29/21 12:48 PM

## 2021-05-29 NOTE — Patient Instructions (Addendum)
I am happy to provide numbers for therapists to meet with family if needed.  Please let me know. Restart Advair for cough, albuterol if needed., please have chest x-ray done at [facility] facility. Restart telmisartan and amlodipine for blood pressure.  We will recheck those levels in the next 2 weeks. Clotrimazole cream twice per day to the rash on the stomach. Recheck with me in 2 weeks but please follow-up sooner if any new or worsening symptoms.  Return to the clinic or go to the nearest emergency room if any of your symptoms worsen or new symptoms occur.   Intertrigo Intertrigo is skin irritation or inflammation (dermatitis) that occurs when folds of skin rub together. The irritation can cause a rash and make skin raw and itchy. This condition most commonly occurs in the skin folds of these areas: Toes. Armpits. Groin. Under the belly. Under the breasts. Buttocks. Intertrigo is not passed from person to person (is not contagious). What are the causes? This condition is caused by heat, moisture, rubbing (friction), and not enough air circulation. The condition can be made worse by: Sweat. Bacteria. A fungus, such as yeast. What increases the risk? This condition is more likely to occur if you have moisture in your skin folds. You are more likely to develop this condition if you: Have diabetes. Are overweight. Are not able to move around or are not active. Live in a warm and moist climate. Wear splints, braces, or other medical devices. Are not able to control your bowels or bladder (have incontinence). What are the signs or symptoms? Symptoms of this condition include: A pink or red skin rash in the skin fold or near the skin fold. Raw or scaly skin. Itchiness. A burning feeling. Bleeding. Leaking fluid. A bad smell. How is this diagnosed? This condition is diagnosed with a medical history and physical exam. You mayalso have a skin swab to test for bacteria or a  fungus. How is this treated? This condition may be treated by: Cleaning and drying your skin. Taking an antibiotic medicine or using an antibiotic skin cream for a bacterial infection. Using an antifungal cream on your skin or taking pills for an infection that was caused by a fungus, such as yeast. Using a steroid ointment to relieve itchiness and irritation. Separating the skin fold with a clean cotton cloth to absorb moisture and allow air to flow into the area. Follow these instructions at home: Keep the affected area clean and dry. Do not scratch your skin. Stay in a cool environment as much as possible. Use an air conditioner or fan, if available. Apply over-the-counter and prescription medicines only as told by your health care provider. If you were prescribed an antibiotic medicine, use it as told by your health care provider. Do not stop using the antibiotic even if your condition improves. Keep all follow-up visits as told by your health care provider. This is important. How is this prevented?  Maintain a healthy weight. Take care of your feet, especially if you have diabetes. Foot care includes: Wearing shoes that fit well. Keeping your feet dry. Wearing clean, breathable socks. Protect the skin around your groin and buttocks, especially if you have incontinence. Skin protection includes: Following a regular cleaning routine. Using skin protectant creams, powders, or ointments. Changing protection pads frequently. Do not wear tight clothes. Wear clothes that are loose, absorbent, and made of cotton. Wear a bra that gives good support, if needed. Shower and dry yourself well after activity  or exercise. Use a hair dryer on a cool setting to dry between skin folds, especially after you bathe. If you have diabetes, keep your blood sugar under control. Contact a health care provider if: Your symptoms do not improve with treatment. Your symptoms get worse or they spread. You  notice increased redness and warmth. You have a fever. Summary Intertrigo is skin irritation or inflammation (dermatitis) that occurs when folds of skin rub together. This condition is caused by heat, moisture, rubbing (friction), and not enough air circulation. This condition may be treated by cleaning and drying your skin and with medicines. Apply over-the-counter and prescription medicines only as told by your health care provider. Keep all follow-up visits as told by your health care provider. This is important. This information is not intended to replace advice given to you by your health care provider. Make sure you discuss any questions you have with your healthcare provider. Document Revised: 02/24/2018 Document Reviewed: 03/03/2018 Elsevier Patient Education  [place].

## 2021-05-30 ENCOUNTER — Ambulatory Visit
Admission: RE | Admit: 2021-05-30 | Discharge: 2021-05-30 | Disposition: A | Discharge status: Home / Self Care | Payer: Medicare Other | Source: Ambulatory Visit | Attending: Family Medicine | Admitting: Family Medicine

## 2021-05-30 ENCOUNTER — Other Ambulatory Visit: Payer: Self-pay | Admitting: Family Medicine

## 2021-05-30 DIAGNOSIS — J45991 Cough variant asthma: Secondary | ICD-10-CM

## 2021-05-30 DIAGNOSIS — M5441 Lumbago with sciatica, right side: Secondary | ICD-10-CM | POA: Diagnosis not present

## 2021-05-30 DIAGNOSIS — E876 Hypokalemia: Secondary | ICD-10-CM

## 2021-05-30 DIAGNOSIS — M5442 Lumbago with sciatica, left side: Secondary | ICD-10-CM | POA: Diagnosis not present

## 2021-05-30 DIAGNOSIS — R0602 Shortness of breath: Secondary | ICD-10-CM

## 2021-05-30 DIAGNOSIS — M7062 Trochanteric bursitis, left hip: Secondary | ICD-10-CM | POA: Diagnosis not present

## 2021-05-30 DIAGNOSIS — R062 Wheezing: Secondary | ICD-10-CM

## 2021-05-30 DIAGNOSIS — M545 Low back pain, unspecified: Secondary | ICD-10-CM | POA: Diagnosis not present

## 2021-05-30 LAB — PRO B NATRIURETIC PEPTIDE: NT-Pro BNP: 222 pg/mL (ref 0–738)

## 2021-05-30 MED ORDER — POTASSIUM CHLORIDE CRYS ER 20 MEQ PO TBCR: 20.0000 meq | EXTENDED_RELEASE_TABLET | Freq: Every day | ORAL | 1 refills | Status: DC

## 2021-05-31 ENCOUNTER — Ambulatory Visit (INDEPENDENT_AMBULATORY_CARE_PROVIDER_SITE_OTHER)
Admission: RE | Admit: 2021-05-31 | Discharge: 2021-05-31 | Disposition: A | Discharge status: Home / Self Care | Payer: Medicare Other | Source: Ambulatory Visit | Attending: Family Medicine | Admitting: Family Medicine

## 2021-05-31 ENCOUNTER — Other Ambulatory Visit: Payer: Self-pay

## 2021-05-31 DIAGNOSIS — R062 Wheezing: Secondary | ICD-10-CM | POA: Diagnosis not present

## 2021-05-31 DIAGNOSIS — J45991 Cough variant asthma: Secondary | ICD-10-CM

## 2021-05-31 DIAGNOSIS — R053 Chronic cough: Secondary | ICD-10-CM | POA: Diagnosis not present

## 2021-05-31 DIAGNOSIS — R0602 Shortness of breath: Secondary | ICD-10-CM

## 2021-06-12 ENCOUNTER — Ambulatory Visit: Payer: Medicare Other | Admitting: Family Medicine

## 2021-06-22 ENCOUNTER — Other Ambulatory Visit: Payer: Self-pay

## 2021-06-22 ENCOUNTER — Ambulatory Visit (INDEPENDENT_AMBULATORY_CARE_PROVIDER_SITE_OTHER): Payer: Medicare Other | Admitting: Family Medicine

## 2021-06-22 VITALS — BP 120/78 | HR 78 | Temp 98.3°F | Resp 16 | Ht 65.5 in | Wt 160.0 lb

## 2021-06-22 DIAGNOSIS — R7989 Other specified abnormal findings of blood chemistry: Secondary | ICD-10-CM

## 2021-06-22 DIAGNOSIS — J4520 Mild intermittent asthma, uncomplicated: Secondary | ICD-10-CM

## 2021-06-22 DIAGNOSIS — E876 Hypokalemia: Secondary | ICD-10-CM

## 2021-06-22 DIAGNOSIS — L304 Erythema intertrigo: Secondary | ICD-10-CM | POA: Diagnosis not present

## 2021-06-22 DIAGNOSIS — I10 Essential (primary) hypertension: Secondary | ICD-10-CM

## 2021-06-22 NOTE — Progress Notes (Signed)
Subjective:  Patient ID: [name], female    DOB: 10-Oct-1946  Age: 75 y.o. MRN: [id number]  CC:  Chief Complaint  Patient presents with   Thyroid Problem    Labs last visit showed possibley low Thyroid, as well as low potassium levels, no concerns    Immunizations    Pt requests high dose flu    HPI [name] presents for  Follow-up from August 15, was seen as a hospital follow-up at that time.  Hypokalemia Thought to  be related to her previous nausea and vomiting, but had been off supplementation. Plan on monitoring if restarting antihypertensives.  Low on recheck August 15, but difficulty contacting patient.  Plan for 20 mEq daily after initial dose of 40 mEq. Taking supplement - 2pills in am, then 1 in afternoon.   Lab Results  Component Value Date   K 5.3 No hemolysis seen (H) 06/22/2021   Low TSH Suspicious for hyperthyroidism.  No weight loss, no palpitations, no new fatigue.  No known hx of thyroid issues  Lab Results  Component Value Date   TSH 0.11 (L) 05/29/2021   Hypertension: Uncontrolled at last visit.  Restarted olmesartan and amlodipine - taking daily. No recent home readings: BP Readings from Last 3 Encounters:  06/22/21 120/78  05/29/21 (!) 170/90  04/15/21 (!) 173/76   Lab Results  Component Value Date   CREATININE 0.71 06/22/2021    Cough, cough variant asthma, wheezing, dyspnea Discussed last visit, was having some persistent cough and frequent use of albuterol.  Advair was restarted for asthma component.  Chest x-ray obtained.  BNP normal at 222.  Not needing albuterol now that she is on advair now - 2 puffs BID.  Breathing better. No dyspnea   CXR 8/17: FINDINGS: Chronic deformity of the right chest wall. No focal consolidation. No pleural effusion or pneumothorax. Heart and mediastinal contours are unremarkable.   No acute osseous abnormality. Left shoulder arthroplasty.   IMPRESSION: No active cardiopulmonary  disease.   Intertrigo: Of lower abdomen/pannus. Lotrimin rx last visit. Using at least once per day, forgets second dose at times. Less itch if using. Improving.    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   Complication of anesthesia    GERD (gastroesophageal reflux disease)    at times   Hyperlipidemia    Hypertension    Peripheral vascular disease ([place])    has spider veins   Pneumonia    Pneumothorax, traumatic    RIGHT; associated with multiple rib fractures after a fall   PONV (postoperative nausea and vomiting)    Pre-diabetes    Past Surgical History:  Procedure Laterality Date   ABDOMINAL HYSTERECTOMY  1982   AUGMENTATION MAMMAPLASTY Bilateral    Patient has bilateral implants    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   BREAST SURGERY     ESOPHAGOGASTRODUODENOSCOPY (EGD) WITH PROPOFOL N/A 01/05/2020   Procedure: ESOPHAGOGASTRODUODENOSCOPY (EGD) WITH PROPOFOL;  Surgeon: Arta Silence, MD;  Location: [facility];  Service: Endoscopy;  Laterality: N/A;   EUS N/A 01/05/2020   Procedure: UPPER ENDOSCOPIC ULTRASOUND (EUS) LINEAR;  Surgeon: Arta Silence, MD;  Location: [facility];  Service: Endoscopy;  Laterality: N/A;   FINE NEEDLE ASPIRATION N/A 01/05/2020   Procedure: FINE NEEDLE ASPIRATION (FNA) LINEAR;  Surgeon: Arta Silence, MD;  Location: [facility];  Service: Endoscopy;  Laterality: N/A;   JOINT REPLACEMENT  2009   right knee   JOINT REPLACEMENT  2011   left knee   MASTECTOMY  2008   right breast   REDUCTION MAMMAPLASTY Left     Patient had lift with implant on Left side    SHOULDER ARTHROSCOPY DISTAL CLAVICLE EXCISION AND OPEN ROTATOR CUFF REPAIR  2011   TOTAL SHOULDER ARTHROPLASTY  03/14/2012   Procedure: TOTAL SHOULDER ARTHROPLASTY;  Surgeon: Alta Corning, MD;  Location: [place] [facility];  Service: Orthopedics;  Laterality: Left;   Allergies  Allergen Reactions   Dilaudid [Hydromorphone Hcl] Itching   Morphine And Related Nausea And Vomiting   Prior to Admission medications   Medication Sig Start Date End Date Taking? Authorizing Provider  acetaminophen (TYLENOL) 325 MG tablet Take 650 mg by mouth every 6 (six) hours as needed for mild pain or moderate pain.    Yes [provider]  albuterol (VENTOLIN HFA) 108 (90 Base) MCG/ACT inhaler Inhale 2 puffs into the lungs every 4 (four) hours as needed for wheezing or shortness of breath. 05/29/21  Yes [name], MD  alum & mag hydroxide-simeth (MAALOX/MYLANTA) 200-200-20 MG/5ML suspension Take 15 mLs by mouth every 4 (four) hours as needed for indigestion or heartburn. 04/15/21  Yes Aline August, MD  amLODipine (NORVASC) 5 MG tablet Take 1 tablet (5 mg total) by mouth daily. 05/29/21  Yes [name], MD  atorvastatin (LIPITOR) 20 MG tablet Take 1 tablet (20 mg total) by mouth daily at 6 PM. 05/29/21  Yes [name], MD  clotrimazole (LOTRIMIN) 1 % cream Apply 1 application topically 2 (two) times daily. 05/29/21  Yes [name], MD  fenofibrate (TRICOR) 145 MG tablet Take 1 tablet (145 mg total) by mouth daily. 08/12/20  Yes [name], MD  fluticasone-salmeterol (ADVAIR HFA) [phone] MCG/ACT inhaler Inhale 2 puffs into the lungs 2 (two) times daily. 05/29/21  Yes [name], MD  ibuprofen (ADVIL,MOTRIN) 100 MG tablet Take 200 mg by mouth every 6 (six) hours as needed for pain.   Yes [provider]  olmesartan (BENICAR) 20 MG tablet Take 1 tablet (20 mg total) by mouth daily. 05/29/21  Yes [name], MD  ondansetron  (ZOFRAN ODT) 4 MG disintegrating tablet Take 1 tablet (4 mg total) by mouth every 8 (eight) hours as needed for nausea or vomiting. 04/15/21  Yes Aline August, MD  oxymetazoline (AFRIN) 0.05 % nasal spray Place 1 spray into both nostrils daily as needed for congestion.   Yes [provider]  potassium chloride SA (KLOR-CON) 20 MEQ tablet Take 1 tablet (20 mEq total) by mouth daily. After initial dose of 2 tablets by mouth once. 05/30/21  Yes [name], MD  Respiratory Therapy Supplies (FLUTTER) DEVI 1 each by Does not apply route 2 (two) times daily. 07/15/19  Yes Julian Hy, DO  Dextromethorphan-guaiFENesin (MUCINEX DM MAXIMUM STRENGTH) 60-1200 MG TB12 Take 1 tablet by mouth daily as needed (chest congestion). Patient not taking: Reported on 06/22/2021    [provider]  pantoprazole (PROTONIX) 40 MG tablet Take 1 tablet (40 mg total) by  mouth 2 (two) times daily. Patient not taking: Reported on 06/22/2021 04/15/21   Aline August, MD   Social History   Socioeconomic History   Marital status: Divorced    Spouse name: Not on file   Number of children: 2   Years of education: Not on file   Highest education level: Not on file  Occupational History   Occupation: Retired   Occupation: Glass blower/designer    Comment: Arts development officer  Tobacco Use   Smoking status: Never   Smokeless tobacco: Never  Vaping Use   Vaping Use: Never used  Substance and Sexual Activity   Alcohol use: No    Alcohol/week: 0.0 standard drinks   Drug use: No   Sexual activity: Not on file  Other Topics Concern   Not on file  Social History Narrative   Not on file   Social Determinants of Health   Financial Resource Strain: Not on file  Food Insecurity: Not on file  Transportation Needs: Not on file  Physical Activity: Not on file  Stress: Not on file  Social Connections: Not on file  Intimate Partner Violence: Not on file    Review of Systems   Objective:   Vitals:    06/22/21 1550  BP: 120/78  Pulse: 78  Resp: 16  Temp: 98.3 F (36.8 C)  TempSrc: Temporal  SpO2: 97%  Weight: 160 lb (72.6 kg)  Height: 5' 5.5" (1.664 m)     Physical Exam Vitals reviewed.  Constitutional:      General: She is not in acute distress.    Appearance: Normal appearance. She is well-developed. She is not ill-appearing or toxic-appearing.  HENT:     Head: Normocephalic and atraumatic.  Eyes:     Conjunctiva/sclera: Conjunctivae normal.     Pupils: Pupils are equal, round, and reactive to light.  Neck:     Vascular: No carotid bruit.     Comments: No nodule or apparent thyromegaly.  Cardiovascular:     Rate and Rhythm: Normal rate and regular rhythm.     Heart sounds: Normal heart sounds.  Pulmonary:     Effort: Pulmonary effort is normal. No respiratory distress.     Breath sounds: Normal breath sounds. No wheezing.  Abdominal:     Palpations: Abdomen is soft. There is no pulsatile mass.     Tenderness: There is no abdominal tenderness.  Musculoskeletal:     Right lower leg: No edema.     Left lower leg: No edema.  Skin:    General: Skin is warm and dry.     Comments: Faint hyperpigmentation below abdominal pannus, no erythema, no wounds.  Neurological:     Mental Status: She is alert and oriented to person, place, and time.  Psychiatric:        Mood and Affect: Mood normal.        Behavior: Behavior normal.       Assessment & Plan:  [name] is a 75 y.o. female . Hypokalemia - Plan: Basic metabolic panel  -Unfortunately misunderstood previous directions, has been taking 60 mEq daily.  Elevated potassium noted as above after visit, but I did have her decrease to just 20 mill equivalents daily for now.  Recheck levels in 2 weeks.  Low TSH level - Plan: TSH + free T4, CANCELED: TSH + free T4  -Check TSH, free T4.  Asymptomatic.  Follow-up in 2 weeks.  Mild intermittent asthma without complication  -Improved with restart of usual  meds as  above, no changes for now.  Essential hypertension - Plan: Basic metabolic panel  -Stable, continue same regimen.  Intertrigo  -Improving, continue topical treatment, twice daily, RTC precautions.  No orders of the defined types were placed in this encounter.  Patient Instructions  I will check potassium and other labs. Take one pill per day for now. Recheck labs in 2 weeks. If levels are low we can increase to 2 pills - I will let you know. Make sure your voicemail has room as we will try to call you.   Recheck in 2 weeks, can decide on further testing for thyroid at that time as well as recheck your potassium levels.  Thanks for coming in today and let me know if there are questions.        Signed,   Merri Ray, MD [place] 06/24/21 11:11 AM

## 2021-06-22 NOTE — Patient Instructions (Addendum)
I will check potassium and other labs. Take one pill per day for now. Recheck labs in 2 weeks. If levels are low we can increase to 2 pills - I will let you know. Make sure your voicemail has room as we will try to call you.   Recheck in 2 weeks, can decide on further testing for thyroid at that time as well as recheck your potassium levels.  Thanks for coming in today and let me know if there are questions.

## 2021-06-23 LAB — BASIC METABOLIC PANEL
BUN: 23 mg/dL (ref 6–23)
CO2: 22 mEq/L (ref 19–32)
Calcium: 9.6 mg/dL (ref 8.4–10.5)
Chloride: 106 mEq/L (ref 96–112)
Creatinine, Ser: 0.71 mg/dL (ref 0.40–1.20)
GFR: 82.9 mL/min (ref >60.00)
Glucose, Bld: 98 mg/dL (ref 70–99)
Potassium: 5.3 mEq/L — ABNORMAL HIGH (ref 3.5–5.1)
Sodium: 139 mEq/L (ref 135–145)

## 2021-06-23 LAB — TSH+FREE T4: TSH W/REFLEX TO FT4: 0.45 mIU/L (ref 0.40–4.50)

## 2021-06-24 ENCOUNTER — Encounter: Payer: Self-pay | Admitting: Family Medicine

## 2021-06-30 ENCOUNTER — Telehealth: Payer: Self-pay

## 2021-06-30 NOTE — Telephone Encounter (Signed)
Pt quested a letter for her apartment complex about her disability limitations, this has been completes attempted a call to pt and pt number is disconnected

## 2021-07-06 ENCOUNTER — Ambulatory Visit: Payer: Medicare Other | Admitting: Family Medicine

## 2021-07-06 DIAGNOSIS — E876 Hypokalemia: Secondary | ICD-10-CM

## 2021-07-13 ENCOUNTER — Ambulatory Visit: Payer: Medicare Other | Admitting: Family Medicine

## 2021-07-19 ENCOUNTER — Ambulatory Visit: Payer: Medicare Other | Admitting: Family Medicine

## 2021-07-20 ENCOUNTER — Ambulatory Visit: Payer: Medicare Other | Admitting: Family Medicine

## 2021-07-27 DIAGNOSIS — M545 Low back pain, unspecified: Secondary | ICD-10-CM | POA: Diagnosis not present

## 2021-07-27 DIAGNOSIS — M5442 Lumbago with sciatica, left side: Secondary | ICD-10-CM | POA: Diagnosis not present

## 2021-07-27 DIAGNOSIS — M5441 Lumbago with sciatica, right side: Secondary | ICD-10-CM | POA: Diagnosis not present

## 2021-08-05 ENCOUNTER — Emergency Department (HOSPITAL_BASED_OUTPATIENT_CLINIC_OR_DEPARTMENT_OTHER)
Admission: EM | Admit: 2021-08-05 | Discharge: 2021-08-05 | Disposition: A | Discharge status: Home / Self Care | Payer: Medicare Other | Attending: Emergency Medicine | Admitting: Emergency Medicine

## 2021-08-05 ENCOUNTER — Encounter (HOSPITAL_BASED_OUTPATIENT_CLINIC_OR_DEPARTMENT_OTHER): Payer: Self-pay | Admitting: Emergency Medicine

## 2021-08-05 ENCOUNTER — Other Ambulatory Visit: Payer: Self-pay

## 2021-08-05 DIAGNOSIS — R1111 Vomiting without nausea: Secondary | ICD-10-CM | POA: Diagnosis not present

## 2021-08-05 DIAGNOSIS — Z96612 Presence of left artificial shoulder joint: Secondary | ICD-10-CM | POA: Insufficient documentation

## 2021-08-05 DIAGNOSIS — R112 Nausea with vomiting, unspecified: Secondary | ICD-10-CM | POA: Insufficient documentation

## 2021-08-05 DIAGNOSIS — Z79899 Other long term (current) drug therapy: Secondary | ICD-10-CM | POA: Insufficient documentation

## 2021-08-05 DIAGNOSIS — I10 Essential (primary) hypertension: Secondary | ICD-10-CM | POA: Insufficient documentation

## 2021-08-05 DIAGNOSIS — Z96653 Presence of artificial knee joint, bilateral: Secondary | ICD-10-CM | POA: Insufficient documentation

## 2021-08-05 DIAGNOSIS — Z9011 Acquired absence of right breast and nipple: Secondary | ICD-10-CM | POA: Diagnosis not present

## 2021-08-05 DIAGNOSIS — Z8507 Personal history of malignant neoplasm of pancreas: Secondary | ICD-10-CM | POA: Insufficient documentation

## 2021-08-05 LAB — CBC WITH DIFFERENTIAL/PLATELET
Abs Immature Granulocytes: 0.02 10*3/uL (ref 0.00–0.07)
Basophils Absolute: 0 10*3/uL (ref 0.0–0.1)
Basophils Relative: 0 %
Eosinophils Absolute: 0.1 10*3/uL (ref 0.0–0.5)
Eosinophils Relative: 1 %
HCT: 34.1 % — ABNORMAL LOW (ref 36.0–46.0)
Hemoglobin: 11.9 g/dL — ABNORMAL LOW (ref 12.0–15.0)
Immature Granulocytes: 0 %
Lymphocytes Relative: 24 %
Lymphs Abs: 1.7 10*3/uL (ref 0.7–4.0)
MCH: 33.2 pg (ref 26.0–34.0)
MCHC: 34.9 g/dL (ref 30.0–36.0)
MCV: 95.3 fL (ref 80.0–100.0)
Monocytes Absolute: 0.5 10*3/uL (ref 0.1–1.0)
Monocytes Relative: 7 %
Neutro Abs: 4.5 10*3/uL (ref 1.7–7.7)
Neutrophils Relative %: 68 %
Platelets: 388 10*3/uL (ref 150–400)
RBC: 3.58 MIL/uL — ABNORMAL LOW (ref 3.87–5.11)
RDW: 11.5 % (ref 11.5–15.5)
WBC: 6.8 10*3/uL (ref 4.0–10.5)
nRBC: 0 % (ref 0.0–0.2)

## 2021-08-05 LAB — COMPREHENSIVE METABOLIC PANEL
ALT: 13 U/L (ref 0–44)
AST: 13 U/L — ABNORMAL LOW (ref 15–41)
Albumin: 3.7 g/dL (ref 3.5–5.0)
Alkaline Phosphatase: 81 U/L (ref 38–126)
Anion gap: 9 (ref 5–15)
BUN: 18 mg/dL (ref 8–23)
CO2: 23 mmol/L (ref 22–32)
Calcium: 9 mg/dL (ref 8.9–10.3)
Chloride: 103 mmol/L (ref 98–111)
Creatinine, Ser: 0.53 mg/dL (ref 0.44–1.00)
GFR, Estimated: >60 mL/min (ref >60)
Glucose, Bld: 120 mg/dL — ABNORMAL HIGH (ref 70–99)
Potassium: 3.1 mmol/L — ABNORMAL LOW (ref 3.5–5.1)
Sodium: 135 mmol/L (ref 135–145)
Total Bilirubin: 0.5 mg/dL (ref 0.3–1.2)
Total Protein: 6.8 g/dL (ref 6.5–8.1)

## 2021-08-05 LAB — LIPASE, BLOOD: Lipase: 26 U/L (ref 11–51)

## 2021-08-05 MED ORDER — ONDANSETRON HCL 4 MG PO TABS: 4.0000 mg | ORAL_TABLET | Freq: Four times a day (QID) | ORAL | 0 refills | Status: DC

## 2021-08-05 MED ORDER — SODIUM CHLORIDE 0.9 % IV BOLUS
1000.0000 mL | Freq: Once | INTRAVENOUS | Status: AC
  Administered 2021-08-05: 1000 mL via INTRAVENOUS

## 2021-08-05 MED ORDER — ONDANSETRON HCL 4 MG/2ML IJ SOLN
4.0000 mg | Freq: Once | INTRAMUSCULAR | Status: AC
  Administered 2021-08-05: 4 mg via INTRAVENOUS
  Filled 2021-08-05: qty 2

## 2021-08-05 NOTE — ED Triage Notes (Signed)
Pt c/o vomiting since Friday morning at 4 am. Pt tried taking over the counter nausea medication without relief. Pt denies exposure to others with illness. Pt has had 2 episodes of vomiting today.

## 2021-08-05 NOTE — ED Provider Notes (Signed)
[facility] EM[facility]GENC[facility] DEPARTMENT Provider Note   CSN: [id number] Arrival date & time: 08/05/21  6222     History Chief Complaint  Patient presents with   Emesis    [name] is a 75 y.o. female.  The history is provided by the patient.  Emesis Severity:  Mild Duration:  2 days Timing:  Intermittent Quality:  Unable to specify Able to tolerate:  Liquids Chronicity:  New Relieved by:  Nothing Worsened by:  Nothing Associated symptoms: no abdominal pain, no arthralgias, no chills, no cough, no diarrhea, no fever, no headaches, no myalgias, no sore throat and no URI   Risk factors: no sick contacts       Past Medical History:  Diagnosis Date   Arthritis    Asthma    Cancer ([place])    Pancreas   Complication of anesthesia    GERD (gastroesophageal reflux disease)    at times   Hyperlipidemia    Hypertension    Peripheral vascular disease ([place])    has spider veins   Pneumonia    Pneumothorax, traumatic    RIGHT; associated with multiple rib fractures after a fall   PONV (postoperative nausea and vomiting)    Pre-diabetes     Patient Active Problem List   Diagnosis Date Noted   GERD (gastroesophageal reflux disease)    Hypokalemia    Intractable nausea and vomiting 01/27/2021   Prediabetes 02/14/2020   Essential hypertension 08/19/2017   Hyperlipidemia 08/19/2017   Cough variant asthma 04/04/2016   Upper airway cough syndrome 04/04/2016   Osteoarthritis of left shoulder 03/24/2012    Past Surgical History:  Procedure Laterality Date   ABDOMINAL HYSTERECTOMY  1982   AUGMENTATION MAMMAPLASTY Bilateral    Patient has bilateral implants    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   BREAST SURGERY     ESOPHAGOGASTRODUODENOSCOPY (EGD) WITH PROPOFOL N/A 01/05/2020   Procedure: ESOPHAGOGASTRODUODENOSCOPY (EGD) WITH PROPOFOL;  Surgeon: Arta Silence, MD;  [facility];  Service: Endoscopy;  Laterality: N/A;   EUS N/A 01/05/2020    Procedure: UPPER ENDOSCOPIC ULTRASOUND (EUS) LINEAR;  Surgeon: Arta Silence, MD;  [facility];  Service: Endoscopy;  Laterality: N/A;   FINE NEEDLE ASPIRATION N/A 01/05/2020   Procedure: FINE NEEDLE ASPIRATION (FNA) LINEAR;  Surgeon: Arta Silence, MD;  [facility];  Service: Endoscopy;  Laterality: N/A;   JOINT REPLACEMENT  2009   right knee   JOINT REPLACEMENT  2011   left knee   MASTECTOMY  2008   right breast   REDUCTION MAMMAPLASTY Left    Patient had lift with implant on Left side    SHOULDER ARTHROSCOPY DISTAL CLAVICLE EXCISION AND OPEN ROTATOR CUFF REPAIR  2011   TOTAL SHOULDER ARTHROPLASTY  03/14/2012   Procedure: TOTAL SHOULDER ARTHROPLASTY;  Surgeon: Alta Corning, MD;  Location: [place];  Service: Orthopedics;  Laterality: Left;     OB History   No obstetric history on file.     Family History  Problem Relation Age of Onset   Breast cancer Sister    Rheum arthritis Sister    Breast cancer Paternal Grandmother    Kidney Stones Mother    CVA Mother    Hypertension Father    Anesthesia problems Neg Hx    Hypotension Neg Hx    Malignant hyperthermia Neg Hx    Pseudochol deficiency Neg Hx     Social History   Tobacco Use   Smoking status:  Never   Smokeless tobacco: Never  Vaping Use   Vaping Use: Never used  Substance Use Topics   Alcohol use: No    Alcohol/week: 0.0 standard drinks   Drug use: No    Home Medications Prior to Admission medications   Medication Sig Start Date End Date Taking? Authorizing Provider  ondansetron (ZOFRAN) 4 MG tablet Take 1 tablet (4 mg total) by mouth every 6 (six) hours. 08/05/21  Yes Deneen Slager, DO  acetaminophen (TYLENOL) 325 MG tablet Take 650 mg by mouth every 6 (six) hours as needed for mild pain or moderate pain.     [provider]  albuterol (VENTOLIN HFA) 108 (90 Base) MCG/ACT inhaler Inhale 2 puffs into the lungs every 4 (four) hours as needed for wheezing or shortness of breath.  05/29/21   [name], MD  alum & mag hydroxide-simeth (MAALOX/MYLANTA) 200-200-20 MG/5ML suspension Take 15 mLs by mouth every 4 (four) hours as needed for indigestion or heartburn. 04/15/21   Aline August, MD  amLODipine (NORVASC) 5 MG tablet Take 1 tablet (5 mg total) by mouth daily. 05/29/21   [name], MD  atorvastatin (LIPITOR) 20 MG tablet Take 1 tablet (20 mg total) by mouth daily at 6 PM. 05/29/21   [name], MD  clotrimazole (LOTRIMIN) 1 % cream Apply 1 application topically 2 (two) times daily. 05/29/21   [name], MD  Dextromethorphan-guaiFENesin [facility] DM MAXIMUM STRENGTH) 60-1200 MG TB12 Take 1 tablet by mouth daily as needed (chest congestion). Patient not taking: Reported on 06/22/2021    [provider]  fenofibrate (TRICOR) 145 MG tablet Take 1 tablet (145 mg total) by mouth daily. 08/12/20   [name], MD  fluticasone-salmeterol (ADVAIR HFA) [phone] MCG/ACT inhaler Inhale 2 puffs into the lungs 2 (two) times daily. 05/29/21   [name], MD  ibuprofen (ADVIL,MOTRIN) 100 MG tablet Take 200 mg by mouth every 6 (six) hours as needed for pain.    [provider]  olmesartan (BENICAR) 20 MG tablet Take 1 tablet (20 mg total) by mouth daily. 05/29/21   [name], MD  ondansetron (ZOFRAN ODT) 4 MG disintegrating tablet Take 1 tablet (4 mg total) by mouth every 8 (eight) hours as needed for nausea or vomiting. 04/15/21   Aline August, MD  oxymetazoline (AFRIN) 0.05 % nasal spray Place 1 spray into both nostrils daily as needed for congestion.    [provider]  pantoprazole (PROTONIX) 40 MG tablet Take 1 tablet (40 mg total) by mouth 2 (two) times daily. Patient not taking: Reported on 06/22/2021 04/15/21   Aline August, MD  potassium chloride SA (KLOR-CON) 20 MEQ tablet Take 1 tablet (20 mEq total) by mouth daily. After initial dose of 2 tablets by mouth once. 05/30/21   [name], MD  Respiratory Therapy  Supplies (FLUTTER) DEVI 1 each by Does not apply route 2 (two) times daily. 07/15/19   Julian Hy, DO    Allergies    Dilaudid [hydromorphone hcl] and Morphine and related  Review of Systems   Review of Systems  Constitutional:  Negative for chills and fever.  HENT:  Negative for ear pain and sore throat.   Eyes:  Negative for pain and visual disturbance.  Respiratory:  Negative for cough and shortness of breath.   Cardiovascular:  Negative for chest pain and palpitations.  Gastrointestinal:  Positive for nausea and vomiting. Negative for abdominal pain and diarrhea.  Genitourinary:  Negative  for dysuria and hematuria.  Musculoskeletal:  Negative for arthralgias, back pain and myalgias.  Skin:  Negative for color change and rash.  Neurological:  Negative for seizures, syncope and headaches.  All other systems reviewed and are negative.  Physical Exam Updated Vital Signs  ED Triage Vitals  Enc Vitals Group     BP 08/05/21 0958 (!) 166/87     Pulse Rate 08/05/21 0958 93     Resp 08/05/21 0958 20     Temp 08/05/21 0958 98.9 F (37.2 C)     Temp Source 08/05/21 0958 Oral     SpO2 08/05/21 0958 99 %     Weight 08/05/21 0959 150 lb (68 kg)     Height 08/05/21 0959 5\' 5"  (1.651 m)     Head Circumference --      Peak Flow --      Pain Score 08/05/21 0959 5     Pain Loc --      Pain Edu? --      Excl. in [place]? --     Physical Exam Vitals and nursing note reviewed.  Constitutional:      General: She is not in acute distress.    Appearance: She is well-developed. She is not ill-appearing.  HENT:     Head: Normocephalic and atraumatic.     Mouth/Throat:     Mouth: Mucous membranes are dry.  Eyes:     Extraocular Movements: Extraocular movements intact.     Conjunctiva/sclera: Conjunctivae normal.     Pupils: Pupils are equal, round, and reactive to light.  Cardiovascular:     Rate and Rhythm: Normal rate and regular rhythm.     Pulses: Normal pulses.     Heart sounds:  Normal heart sounds. No murmur heard. Pulmonary:     Effort: Pulmonary effort is normal. No respiratory distress.     Breath sounds: Normal breath sounds.  Abdominal:     General: Abdomen is flat.     Palpations: Abdomen is soft.     Tenderness: There is no abdominal tenderness.  Musculoskeletal:     Cervical back: Normal range of motion and neck supple.  Skin:    General: Skin is warm and dry.     Capillary Refill: Capillary refill takes less than 2 seconds.  Neurological:     General: No focal deficit present.     Mental Status: She is alert.    ED Results / Procedures / Treatments   Labs (all labs ordered are listed, but only abnormal results are displayed) Labs Reviewed  CBC WITH DIFFERENTIAL/PLATELET - Abnormal; Notable for the following components:      Result Value   RBC 3.58 (*)    Hemoglobin 11.9 (*)    HCT 34.1 (*)    All other components within normal limits  COMPREHENSIVE METABOLIC PANEL - Abnormal; Notable for the following components:   Potassium 3.1 (*)    Glucose, Bld 120 (*)    AST 13 (*)    All other components within normal limits  LIPASE, BLOOD  URINALYSIS, ROUTINE W REFLEX MICROSCOPIC    EKG None  Radiology No results found.  Procedures Procedures   Medications Ordered in ED Medications  ondansetron (ZOFRAN) injection 4 mg (4 mg Intravenous Given 08/05/21 1044)  sodium chloride 0.9 % bolus 1,000 mL (0 mLs Intravenous Stopped 08/05/21 1212)    ED Course  I have reviewed the triage vital signs and the nursing notes.  Pertinent labs & imaging results that  were available during my care of the patient were reviewed by me and considered in my medical decision making (see chart for details).    MDM Rules/Calculators/A&P                           KAIDENCE SANT is here with nausea and vomiting.  Unremarkable vitals.  No fever.  No abdominal pain.  May be viral process or suspicious food intake.  Has had intermittent vomiting for about 2  days.  Nontender on abdominal exam.  Looks clinically dehydrated.  IV fluids are given and basic labs are drawn.  No obvious signs of dehydration on lab work.  No significant electrolyte or leukocytosis.  Abdominal exam is benign.  No concern for bowel obstruction or appendicitis or other intra-abdominal infection.  She is not having any urinary symptoms.  Feels much better after IV fluids and IV Zofran.  Overall suspect viral/foodborne process.  Understands return precautions and discharged in ED in good condition.  This chart was dictated using voice recognition software.  Despite best efforts to proofread,  errors can occur which can change the documentation meaning.   Final Clinical Impression(s) / ED Diagnoses Final diagnoses:  Vomiting without nausea, unspecified vomiting type    Rx / DC Orders ED Discharge Orders          Ordered    ondansetron (ZOFRAN) 4 MG tablet  Every 6 hours        08/05/21 1230             [place], DO 08/05/21 1231

## 2021-08-05 NOTE — ED Notes (Signed)
Pt reports improvement in nausea symptoms

## 2021-08-17 DIAGNOSIS — M533 Sacrococcygeal disorders, not elsewhere classified: Secondary | ICD-10-CM | POA: Diagnosis not present

## 2021-08-17 DIAGNOSIS — M5442 Lumbago with sciatica, left side: Secondary | ICD-10-CM | POA: Diagnosis not present

## 2021-08-17 DIAGNOSIS — M5441 Lumbago with sciatica, right side: Secondary | ICD-10-CM | POA: Diagnosis not present

## 2021-08-17 DIAGNOSIS — M545 Low back pain, unspecified: Secondary | ICD-10-CM | POA: Diagnosis not present

## 2021-09-03 DIAGNOSIS — Z20828 Contact with and (suspected) exposure to other viral communicable diseases: Secondary | ICD-10-CM | POA: Diagnosis not present

## 2021-09-25 ENCOUNTER — Telehealth: Payer: Self-pay | Admitting: Family Medicine

## 2021-09-25 NOTE — Chronic Care Management (AMB) (Signed)
  Chronic Care Management   Outreach Note  09/25/2021 Name: [name] MRN: [id number] DOB: 05-Sep-1946  Referred by: [name], MD Reason for referral : No chief complaint on file.   An unsuccessful telephone outreach was attempted today. The patient was referred to the pharmacist for assistance with care management and care coordination.   Follow Up Plan:   Tatjana Dellinger Upstream Scheduler

## 2021-09-29 ENCOUNTER — Telehealth: Payer: Self-pay | Admitting: Family Medicine

## 2021-09-29 NOTE — Chronic Care Management (AMB) (Signed)
°  Chronic Care Management   Outreach Note  09/29/2021 Name: KHAILEE MICK MRN: [id number] DOB: 08-24-1946  Referred by: [name], MD Reason for referral : No chief complaint on file.   A second unsuccessful telephone outreach was attempted today. The patient was referred to pharmacist for assistance with care management and care coordination.  Follow Up Plan:  No working number/ Son has no contact to pt  Psychologist, prison and probation services

## 2021-10-04 ENCOUNTER — Telehealth: Payer: Self-pay | Admitting: Family Medicine

## 2021-10-04 NOTE — Chronic Care Management (AMB) (Signed)
°  Chronic Care Management   Outreach Note  10/04/2021 Name: [name] MRN: [id number] DOB: [birth year]  Referred by: [name], MD Reason for referral : No chief complaint on file.   Third unsuccessful telephone outreach was attempted today. The patient was referred to the pharmacist for assistance with care management and care coordination.   Follow Up Plan:   Tatjana Dellinger Upstream Scheduler

## 2021-10-07 ENCOUNTER — Emergency Department (HOSPITAL_BASED_OUTPATIENT_CLINIC_OR_DEPARTMENT_OTHER): Payer: Medicare Other

## 2021-10-07 ENCOUNTER — Other Ambulatory Visit: Payer: Self-pay

## 2021-10-07 ENCOUNTER — Encounter (HOSPITAL_BASED_OUTPATIENT_CLINIC_OR_DEPARTMENT_OTHER): Payer: Self-pay

## 2021-10-07 ENCOUNTER — Emergency Department (HOSPITAL_BASED_OUTPATIENT_CLINIC_OR_DEPARTMENT_OTHER)
Admission: EM | Admit: 2021-10-07 | Discharge: 2021-10-07 | Disposition: A | Discharge status: Home / Self Care | Payer: Medicare Other | Attending: Emergency Medicine | Admitting: Emergency Medicine

## 2021-10-07 DIAGNOSIS — R059 Cough, unspecified: Secondary | ICD-10-CM | POA: Insufficient documentation

## 2021-10-07 DIAGNOSIS — Z79899 Other long term (current) drug therapy: Secondary | ICD-10-CM | POA: Diagnosis not present

## 2021-10-07 DIAGNOSIS — Z96653 Presence of artificial knee joint, bilateral: Secondary | ICD-10-CM | POA: Insufficient documentation

## 2021-10-07 DIAGNOSIS — Z7951 Long term (current) use of inhaled steroids: Secondary | ICD-10-CM | POA: Diagnosis not present

## 2021-10-07 DIAGNOSIS — Z96612 Presence of left artificial shoulder joint: Secondary | ICD-10-CM | POA: Insufficient documentation

## 2021-10-07 DIAGNOSIS — R000 Tachycardia, unspecified: Secondary | ICD-10-CM | POA: Insufficient documentation

## 2021-10-07 DIAGNOSIS — Z20822 Contact with and (suspected) exposure to covid-19: Secondary | ICD-10-CM | POA: Insufficient documentation

## 2021-10-07 DIAGNOSIS — R0602 Shortness of breath: Secondary | ICD-10-CM | POA: Insufficient documentation

## 2021-10-07 DIAGNOSIS — Z8507 Personal history of malignant neoplasm of pancreas: Secondary | ICD-10-CM | POA: Insufficient documentation

## 2021-10-07 DIAGNOSIS — I10 Essential (primary) hypertension: Secondary | ICD-10-CM | POA: Insufficient documentation

## 2021-10-07 DIAGNOSIS — J45909 Unspecified asthma, uncomplicated: Secondary | ICD-10-CM | POA: Insufficient documentation

## 2021-10-07 LAB — CBC WITH DIFFERENTIAL/PLATELET
Abs Immature Granulocytes: 0.02 10*3/uL (ref 0.00–0.07)
Basophils Absolute: 0.1 10*3/uL (ref 0.0–0.1)
Basophils Relative: 1 %
Eosinophils Absolute: 0.8 10*3/uL — ABNORMAL HIGH (ref 0.0–0.5)
Eosinophils Relative: 9 %
HCT: 36.5 % (ref 36.0–46.0)
Hemoglobin: 12.6 g/dL (ref 12.0–15.0)
Immature Granulocytes: 0 %
Lymphocytes Relative: 28 %
Lymphs Abs: 2.4 10*3/uL (ref 0.7–4.0)
MCH: 32.1 pg (ref 26.0–34.0)
MCHC: 34.5 g/dL (ref 30.0–36.0)
MCV: 93.1 fL (ref 80.0–100.0)
Monocytes Absolute: 0.7 10*3/uL (ref 0.1–1.0)
Monocytes Relative: 8 %
Neutro Abs: 4.7 10*3/uL (ref 1.7–7.7)
Neutrophils Relative %: 54 %
Platelets: 325 10*3/uL (ref 150–400)
RBC: 3.92 MIL/uL (ref 3.87–5.11)
RDW: 13.1 % (ref 11.5–15.5)
WBC: 8.7 10*3/uL (ref 4.0–10.5)
nRBC: 0 % (ref 0.0–0.2)

## 2021-10-07 LAB — BASIC METABOLIC PANEL
Anion gap: 10 (ref 5–15)
BUN: 16 mg/dL (ref 8–23)
CO2: 28 mmol/L (ref 22–32)
Calcium: 8.8 mg/dL — ABNORMAL LOW (ref 8.9–10.3)
Chloride: 101 mmol/L (ref 98–111)
Creatinine, Ser: 0.54 mg/dL (ref 0.44–1.00)
GFR, Estimated: >60 mL/min (ref >60)
Glucose, Bld: 121 mg/dL — ABNORMAL HIGH (ref 70–99)
Potassium: 2.7 mmol/L — CL (ref 3.5–5.1)
Sodium: 139 mmol/L (ref 135–145)

## 2021-10-07 LAB — RESP PANEL BY RT-PCR (FLU A&B, COVID) ARPGX2
Influenza A by PCR: NEGATIVE
Influenza B by PCR: NEGATIVE
SARS Coronavirus 2 by RT PCR: NEGATIVE

## 2021-10-07 LAB — TROPONIN I (HIGH SENSITIVITY)
Troponin I (High Sensitivity): 6 ng/L (ref <18)
Troponin I (High Sensitivity): 6 ng/L (ref <18)

## 2021-10-07 LAB — BRAIN NATRIURETIC PEPTIDE: B Natriuretic Peptide: 34.1 pg/mL (ref 0.0–100.0)

## 2021-10-07 LAB — D-DIMER, QUANTITATIVE: D-Dimer, Quant: 0.61 ug/mL-FEU — ABNORMAL HIGH (ref 0.00–0.50)

## 2021-10-07 MED ORDER — PREDNISONE 20 MG PO TABS: 40.0000 mg | ORAL_TABLET | Freq: Every day | ORAL | 0 refills | Status: AC

## 2021-10-07 MED ORDER — AZITHROMYCIN 250 MG PO TABS: ORAL_TABLET | ORAL | 0 refills | Status: AC

## 2021-10-07 MED ORDER — IPRATROPIUM-ALBUTEROL 0.5-2.5 (3) MG/3ML IN SOLN
RESPIRATORY_TRACT | Status: AC
  Administered 2021-10-07: 14:00:00 3 mL
  Filled 2021-10-07: qty 6

## 2021-10-07 MED ORDER — POTASSIUM CHLORIDE 10 MEQ/100ML IV SOLN
10.0000 meq | Freq: Once | INTRAVENOUS | Status: AC
  Administered 2021-10-07: 17:00:00 10 meq via INTRAVENOUS
  Filled 2021-10-07: qty 100

## 2021-10-07 MED ORDER — POTASSIUM CHLORIDE CRYS ER 20 MEQ PO TBCR
60.0000 meq | EXTENDED_RELEASE_TABLET | Freq: Once | ORAL | Status: AC
  Administered 2021-10-07: 16:00:00 60 meq via ORAL
  Filled 2021-10-07: qty 3

## 2021-10-07 MED ORDER — IPRATROPIUM-ALBUTEROL 0.5-2.5 (3) MG/3ML IN SOLN
3.0000 mL | Freq: Once | RESPIRATORY_TRACT | Status: AC
  Administered 2021-10-07: 14:00:00 3 mL via RESPIRATORY_TRACT

## 2021-10-07 NOTE — ED Triage Notes (Signed)
Pt c/o shortness of breath x 1 week, progressively worsening, cough.

## 2021-10-07 NOTE — ED Notes (Signed)
Daughter Joelene Millin calling grandsons for transport home.

## 2021-10-07 NOTE — ED Provider Notes (Signed)
[facility] EMERGENCY DEPARTMENT Provider Note   CSN: [id number] Arrival date & time: 10/07/21  1353     History No chief complaint on file.   NALY SCHWANZ is a 75 y.o. female with a past medical history of hypertension, reflux and asthma presenting today with complaint of shortness of breath.  Patient reports that she has been feeling this way over the past week however it has gotten worse.  Occasionally has a cough that is productive.  Denies any fevers or chills.  No associated chest pain or palpitations.  Not dizzy.  No history of ACS, DVT/PE.  No recent long travel or active cancer although she did have breast cancer 7 years ago. No known sick contacts.  Never smoker and denies history of heart failure.   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   Complication of anesthesia    GERD (gastroesophageal reflux disease)    at times   Hyperlipidemia    Hypertension    Peripheral vascular disease ([place])    has spider veins   Pneumonia    Pneumothorax, traumatic    RIGHT; associated with multiple rib fractures after a fall   PONV (postoperative nausea and vomiting)    Pre-diabetes     Patient Active Problem List   Diagnosis Date Noted   GERD (gastroesophageal reflux disease)    Hypokalemia    Intractable nausea and vomiting 01/27/2021   Prediabetes 02/14/2020   Essential hypertension 08/19/2017   Hyperlipidemia 08/19/2017   Cough variant asthma 04/04/2016   Upper airway cough syndrome 04/04/2016   Osteoarthritis of left shoulder 03/24/2012    Past Surgical History:  Procedure Laterality Date   ABDOMINAL HYSTERECTOMY  1982   AUGMENTATION MAMMAPLASTY Bilateral    Patient has bilateral implants    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   BREAST SURGERY     ESOPHAGOGASTRODUODENOSCOPY (EGD) WITH PROPOFOL N/A 01/05/2020   Procedure: ESOPHAGOGASTRODUODENOSCOPY (EGD) WITH PROPOFOL;  Surgeon: Arta Silence, MD;   Location: [facility];  Service: Endoscopy;  Laterality: N/A;   EUS N/A 01/05/2020   Procedure: UPPER ENDOSCOPIC ULTRASOUND (EUS) LINEAR;  Surgeon: Arta Silence, MD;  Location: [facility];  Service: Endoscopy;  Laterality: N/A;   FINE NEEDLE ASPIRATION N/A 01/05/2020   Procedure: FINE NEEDLE ASPIRATION (FNA) LINEAR;  Surgeon: Arta Silence, MD;  Location: [facility];  Service: Endoscopy;  Laterality: N/A;   JOINT REPLACEMENT  2009   right knee   JOINT REPLACEMENT  2011   left knee   MASTECTOMY  2008   right breast   REDUCTION MAMMAPLASTY Left    Patient had lift with implant on Left side    SHOULDER ARTHROSCOPY DISTAL CLAVICLE EXCISION AND OPEN ROTATOR CUFF REPAIR  2011   TOTAL SHOULDER ARTHROPLASTY  03/14/2012   Procedure: TOTAL SHOULDER ARTHROPLASTY;  Surgeon: Alta Corning, MD;  Location: [facility];  Service: Orthopedics;  Laterality: Left;     OB History   No obstetric history on file.     Family History  Problem Relation Age of Onset   Breast cancer Sister    Rheum arthritis Sister    Breast cancer Paternal Grandmother    Kidney Stones Mother    CVA Mother    Hypertension Father    Anesthesia problems Neg Hx    Hypotension Neg Hx    Malignant hyperthermia Neg Hx    Pseudochol deficiency Neg  Hx     Social History   Tobacco Use   Smoking status: Never   Smokeless tobacco: Never  Vaping Use   Vaping Use: Never used  Substance Use Topics   Alcohol use: No    Alcohol/week: 0.0 standard drinks   Drug use: No    Home Medications Prior to Admission medications   Medication Sig Start Date End Date Taking? Authorizing Provider  acetaminophen (TYLENOL) 325 MG tablet Take 650 mg by mouth every 6 (six) hours as needed for mild pain or moderate pain.     [provider]  albuterol (VENTOLIN HFA) 108 (90 Base) MCG/ACT inhaler Inhale 2 puffs into the lungs every 4 (four) hours as needed for wheezing or shortness of breath. 05/29/21   [name], MD  alum  & mag hydroxide-simeth (MAALOX/MYLANTA) 200-200-20 MG/5ML suspension Take 15 mLs by mouth every 4 (four) hours as needed for indigestion or heartburn. 04/15/21   Aline August, MD  amLODipine (NORVASC) 5 MG tablet Take 1 tablet (5 mg total) by mouth daily. 05/29/21   [name], MD  atorvastatin (LIPITOR) 20 MG tablet Take 1 tablet (20 mg total) by mouth daily at 6 PM. 05/29/21   [name], MD  clotrimazole (LOTRIMIN) 1 % cream Apply 1 application topically 2 (two) times daily. 05/29/21   [name], MD  Dextromethorphan-guaiFENesin [facility] DM MAXIMUM STRENGTH) 60-1200 MG TB12 Take 1 tablet by mouth daily as needed (chest congestion). Patient not taking: Reported on 06/22/2021    [provider]  fenofibrate (TRICOR) 145 MG tablet Take 1 tablet (145 mg total) by mouth daily. 08/12/20   [name], MD  fluticasone-salmeterol (ADVAIR HFA) [phone] MCG/ACT inhaler Inhale 2 puffs into the lungs 2 (two) times daily. 05/29/21   [name], MD  ibuprofen (ADVIL,MOTRIN) 100 MG tablet Take 200 mg by mouth every 6 (six) hours as needed for pain.    [provider]  olmesartan (BENICAR) 20 MG tablet Take 1 tablet (20 mg total) by mouth daily. 05/29/21   [name], MD  ondansetron (ZOFRAN ODT) 4 MG disintegrating tablet Take 1 tablet (4 mg total) by mouth every 8 (eight) hours as needed for nausea or vomiting. 04/15/21   Aline August, MD  ondansetron (ZOFRAN) 4 MG tablet Take 1 tablet (4 mg total) by mouth every 6 (six) hours. 08/05/21   Curatolo, Adam, DO  oxymetazoline (AFRIN) 0.05 % nasal spray Place 1 spray into both nostrils daily as needed for congestion.    [provider]  pantoprazole (PROTONIX) 40 MG tablet Take 1 tablet (40 mg total) by mouth 2 (two) times daily. Patient not taking: Reported on 06/22/2021 04/15/21   Aline August, MD  potassium chloride SA (KLOR-CON) 20 MEQ tablet Take 1 tablet (20 mEq total) by mouth daily. After initial dose  of 2 tablets by mouth once. 05/30/21   [name], MD  Respiratory Therapy Supplies (FLUTTER) DEVI 1 each by Does not apply route 2 (two) times daily. 07/15/19   Julian Hy, DO    Allergies    Dilaudid [hydromorphone hcl] and Morphine and related  Review of Systems   Review of Systems  Constitutional:  Negative for chills and fever.  Respiratory:  Positive for shortness of breath. Negative for cough and chest tightness.   Cardiovascular:  Negative for chest pain, palpitations and leg swelling.  Gastrointestinal:  Negative for nausea and vomiting.  Musculoskeletal:  Negative for back pain.  Neurological:  Negative for dizziness and headaches.   Physical Exam Updated Vital Signs Pulse (!) 108    Temp 98.6 F (37 C) (Oral)    Resp (!) 45    Ht 5\' 5"  (1.651 m)    Wt 63.5 kg    SpO2 92%    BMI 23.30 kg/m   Physical Exam Vitals and nursing note reviewed.  Constitutional:      General: She is in acute distress.     Appearance: Normal appearance. She is ill-appearing.  HENT:     Head: Normocephalic and atraumatic.  Eyes:     General: No scleral icterus.    Conjunctiva/sclera: Conjunctivae normal.  Cardiovascular:     Rate and Rhythm: Regular rhythm. Tachycardia present.  Pulmonary:     Effort: Pulmonary effort is normal. No respiratory distress.     Breath sounds: Wheezing (Scattered wheezes in all lung fields) present.  Skin:    Findings: No rash.  Neurological:     Mental Status: She is alert.  Psychiatric:        Mood and Affect: Mood normal.    ED Results / Procedures / Treatments   Labs (all labs ordered are listed, but only abnormal results are displayed) Labs Reviewed  BASIC METABOLIC PANEL - Abnormal; Notable for the following components:      Result Value   Potassium 2.7 (*)    Glucose, Bld 121 (*)    Calcium 8.8 (*)    All other components within normal limits  CBC WITH DIFFERENTIAL/PLATELET - Abnormal; Notable for the following components:    Eosinophils Absolute 0.8 (*)    All other components within normal limits  D-DIMER, QUANTITATIVE (NOT AT [facility]) - Abnormal; Notable for the following components:   D-Dimer, Quant 0.61 (*)    All other components within normal limits  RESP PANEL BY RT-PCR (FLU A&B, COVID) ARPGX2  BRAIN NATRIURETIC PEPTIDE  TROPONIN I (HIGH SENSITIVITY)  TROPONIN I (HIGH SENSITIVITY)    EKG None  Radiology DG Chest Portable 1 View  Result Date: 10/07/2021 CLINICAL DATA:  Shortness of breath EXAM: PORTABLE CHEST 1 VIEW COMPARISON:  05/31/2021 FINDINGS: Cardiac size is within normal limits. There are no signs of pulmonary edema or new focal infiltrates. There is no pleural effusion or pneumothorax. Deformities in multiple right ribs have not changed. There is previous left shoulder arthroplasty. IMPRESSION: There are no new infiltrates or signs of pulmonary edema. Electronically Signed   By: Elmer Picker M.D.   On: 10/07/2021 14:32    Procedures Procedures   Medications Ordered in ED Medications  ipratropium-albuterol (DUONEB) 0.5-2.5 (3) MG/3ML nebulizer solution (has no administration in time range)    ED Course  I have reviewed the triage vital signs and the nursing notes.  Pertinent labs & imaging results that were available during my care of the patient were reviewed by me and considered in my medical decision making (see chart for details).    MDM Rules/Calculators/A&P The emergent differential diagnosis for shortness of breath includes, but is not limited to, Pulmonary edema, bronchoconstriction, Pneumonia, Pulmonary embolism, Pneumotherax/ Hemothorax, Dysrythmia, ACS.  All of these were considered throughout the evaluation of the patient.  When she arrived, she was then moderate respiratory distress.  Tachycardic to 100s and respiration rate in the mid to upper 30s upon my initial evaluation.  RA sat was 92%. She was given a nebulizer treatment which brought down her heart rate as well  as her respirations to a normal range.  She had  no peripheral edema or decrease in breath sounds.  She denied any history of respiratory diagnoses however per chart review patient has asthma.  She reported that this happened a few years ago and she "sometimes has breathing problems."  Patient and moderate risk stratification group of [place] PE score.  D-dimer ordered at this time.  Will order CT angiogram if positive.  Workup: -EKG: sinus tach -CXR: negative -Labs: Remarkable for a potassium of 2.7. -Covid/flu: Negative -D-dimer: 0.61 however negative when age-adjusted. -Trop: negative x2   Tx: -Patient responded well to duoneb treatment x 2.  Currently satting 99 on room air. -Potassium was replaced both orally and intravenously.   At this time, doubt any life-threatening cause of patient's symptoms.  She will be treated with steroids and a Z-Pak for the next few days and follow-up with primary care about her hypokalemia and symptoms today.  Final Clinical Impression(s) / ED Diagnoses Final diagnoses:  Shortness of breath    Rx / DC Orders Results and diagnoses were explained to the patient. Return precautions discussed in full. Patient had no additional questions and expressed complete understanding.    Rhae Hammock, PA-C 10/07/21 Ram[place], [place], DO 10/08/21 8099

## 2021-10-07 NOTE — ED Notes (Signed)
XR at bedside

## 2021-10-07 NOTE — Discharge Instructions (Addendum)
Your work-up is reassuring today.  Your potassium was low and it is important for you to follow-up with your primary care provider about this.  I am starting you on steroids as well as antibiotics.  These have been sent to the 24-hour pharmacy in [facility] since your listed pharmacies are closed today and tomorrow.  Read the information about shortness of breath attached to these discharge papers and return with any worsening of your symptoms.

## 2021-10-07 NOTE — ED Notes (Signed)
IV attempted x2, unsuccessful.

## 2021-10-07 NOTE — ED Notes (Addendum)
Kim-daughter given update by phone; okayed by pt. [phone].

## 2021-10-16 ENCOUNTER — Other Ambulatory Visit: Payer: Self-pay

## 2021-10-16 ENCOUNTER — Emergency Department (HOSPITAL_BASED_OUTPATIENT_CLINIC_OR_DEPARTMENT_OTHER)
Admission: EM | Admit: 2021-10-16 | Discharge: 2021-10-16 | Disposition: A | Discharge status: Home / Self Care | Payer: Medicare Other | Attending: Emergency Medicine | Admitting: Emergency Medicine

## 2021-10-16 ENCOUNTER — Encounter (HOSPITAL_BASED_OUTPATIENT_CLINIC_OR_DEPARTMENT_OTHER): Payer: Self-pay

## 2021-10-16 DIAGNOSIS — R062 Wheezing: Secondary | ICD-10-CM | POA: Insufficient documentation

## 2021-10-16 DIAGNOSIS — I10 Essential (primary) hypertension: Secondary | ICD-10-CM | POA: Diagnosis not present

## 2021-10-16 DIAGNOSIS — Z79899 Other long term (current) drug therapy: Secondary | ICD-10-CM | POA: Insufficient documentation

## 2021-10-16 DIAGNOSIS — R1111 Vomiting without nausea: Secondary | ICD-10-CM | POA: Diagnosis not present

## 2021-10-16 DIAGNOSIS — R112 Nausea with vomiting, unspecified: Secondary | ICD-10-CM | POA: Diagnosis not present

## 2021-10-16 DIAGNOSIS — R110 Nausea: Secondary | ICD-10-CM

## 2021-10-16 DIAGNOSIS — R5383 Other fatigue: Secondary | ICD-10-CM | POA: Diagnosis not present

## 2021-10-16 MED ORDER — ALBUTEROL SULFATE HFA 108 (90 BASE) MCG/ACT IN AERS: 1.0000 | INHALATION_SPRAY | Freq: Four times a day (QID) | RESPIRATORY_TRACT | 0 refills | Status: DC | PRN

## 2021-10-16 MED ORDER — ONDANSETRON HCL 4 MG PO TABS: 4.0000 mg | ORAL_TABLET | Freq: Four times a day (QID) | ORAL | 0 refills | Status: DC

## 2021-10-16 NOTE — ED Provider Notes (Signed)
[facility] EMERGENCY DEPARTMENT Provider Note   CSN: [id number] Arrival date & time: 10/16/21  1221     History  Chief Complaint  Patient presents with   Nausea    [name] is a 76 y.o. female with a past medical history significant for recent acute bronchitis, history of hypertension, prediabetes, asthma who presents with nausea without vomiting x2 days.  Patient reports that she has also had some ongoing fatigue, and concern about weight loss since her recent admission for acute bronchitis.  Patient reports that she was discharged with antibiotics, and steroids for acute bronchitis, but she has not been able to take these for the last couple of days secondary to nausea.  Patient reports that she also did not take her blood pressure medication today.  Patient denies any vomiting, diarrhea, stomach pain, chest pain, shortness of breath at this time.  She does have a little bit of lingering wheezing she says, however she is out of her asthma inhaler at this time.  HPI     Home Medications Prior to Admission medications   Medication Sig Start Date End Date Taking? Authorizing Provider  acetaminophen (TYLENOL) 325 MG tablet Take 650 mg by mouth every 6 (six) hours as needed for mild pain or moderate pain.     [provider]  albuterol (VENTOLIN HFA) 108 (90 Base) MCG/ACT inhaler Inhale 2 puffs into the lungs every 4 (four) hours as needed for wheezing or shortness of breath. 05/29/21   [name], MD  alum & mag hydroxide-simeth (MAALOX/MYLANTA) 200-200-20 MG/5ML suspension Take 15 mLs by mouth every 4 (four) hours as needed for indigestion or heartburn. 04/15/21   Aline August, MD  amLODipine (NORVASC) 5 MG tablet Take 1 tablet (5 mg total) by mouth daily. 05/29/21   [name], MD  atorvastatin (LIPITOR) 20 MG tablet Take 1 tablet (20 mg total) by mouth daily at 6 PM. 05/29/21   [name], MD  clotrimazole (LOTRIMIN) 1 % cream Apply 1  application topically 2 (two) times daily. 05/29/21   [name], MD  Dextromethorphan-guaiFENesin [facility] DM MAXIMUM STRENGTH) 60-1200 MG TB12 Take 1 tablet by mouth daily as needed (chest congestion). Patient not taking: Reported on 06/22/2021    [provider]  fenofibrate (TRICOR) 145 MG tablet Take 1 tablet (145 mg total) by mouth daily. 08/12/20   [name], MD  fluticasone-salmeterol (ADVAIR HFA) [phone] MCG/ACT inhaler Inhale 2 puffs into the lungs 2 (two) times daily. 05/29/21   [name], MD  ibuprofen (ADVIL,MOTRIN) 100 MG tablet Take 200 mg by mouth every 6 (six) hours as needed for pain.    [provider]  olmesartan (BENICAR) 20 MG tablet Take 1 tablet (20 mg total) by mouth daily. 05/29/21   [name], MD  ondansetron (ZOFRAN ODT) 4 MG disintegrating tablet Take 1 tablet (4 mg total) by mouth every 8 (eight) hours as needed for nausea or vomiting. 04/15/21   Aline August, MD  ondansetron (ZOFRAN) 4 MG tablet Take 1 tablet (4 mg total) by mouth every 6 (six) hours. 08/05/21   Curatolo, Adam, DO  oxymetazoline (AFRIN) 0.05 % nasal spray Place 1 spray into both nostrils daily as needed for congestion.    [provider]  pantoprazole (PROTONIX) 40 MG tablet Take 1 tablet (40 mg total) by mouth 2 (two) times daily. Patient not taking: Reported on 06/22/2021 04/15/21   Aline August, MD  potassium chloride SA (KLOR-CON)  20 MEQ tablet Take 1 tablet (20 mEq total) by mouth daily. After initial dose of 2 tablets by mouth once. 05/30/21   [name], MD  Respiratory Therapy Supplies (FLUTTER) DEVI 1 each by Does not apply route 2 (two) times daily. 07/15/19   Julian Hy, DO      Allergies    Dilaudid [hydromorphone hcl] and Morphine and related    Review of Systems   Review of Systems  Gastrointestinal:  Positive for nausea.  All other systems reviewed and are negative.  Physical Exam Updated Vital Signs BP (!) 166/93 (BP  Location: Left Arm)    Pulse 85    Temp 98.6 F (37 C) (Oral)    Resp 18    Ht 5\' 5"  (1.651 m)    Wt 66.2 kg    SpO2 96%    BMI 24.30 kg/m  Physical Exam Vitals and nursing note reviewed.  Constitutional:      General: She is not in acute distress.    Appearance: Normal appearance.  HENT:     Head: Normocephalic and atraumatic.  Eyes:     General:        Right eye: No discharge.        Left eye: No discharge.  Cardiovascular:     Rate and Rhythm: Normal rate and regular rhythm.     Heart sounds: No murmur heard.   No friction rub. No gallop.  Pulmonary:     Effort: Pulmonary effort is normal.     Breath sounds: Normal breath sounds.     Comments: Patient has some and expiratory wheezing without focal consolidation noted in the right upper lobe. Abdominal:     General: Bowel sounds are normal.     Palpations: Abdomen is soft.  Skin:    General: Skin is warm and dry.     Capillary Refill: Capillary refill takes less than 2 seconds.  Neurological:     Mental Status: She is alert and oriented to person, place, and time.  Psychiatric:        Mood and Affect: Mood normal.        Behavior: Behavior normal.    ED Results / Procedures / Treatments   Labs (all labs ordered are listed, but only abnormal results are displayed) Labs Reviewed - No data to display  EKG None  Radiology No results found.  Procedures Procedures    Medications Ordered in ED Medications - No data to display  ED Course/ Medical Decision Making/ A&P                           Medical Decision Making  This patient presents to the ED for concern of nausea without vomiting, concern for weight loss, this involves an extensive number of treatment options, and is a complaint that carries with it a high risk of complications and morbidity.  The differential diagnosis includes nausea of unknown etiology, medication complication, complication from recent acute bronchitis initiated, COVID, flu, unspecified  GI illness.   Co morbidities that complicate the patient evaluation  Recent treatment for acute bronchitis.   Additional history obtained from grand-daughter External records from outside source obtained and reviewed including recent ED course note with Madison Redwine PA-C, Lennice Sites, MD.   Test Considered:  CBC, BMP, chest x-ray, lipase.  Used shared decision making with patient discussed that we could do basic lab work, chest x-ray, and evaluation of abdominal function, however  as she does not have any focal tenderness, active vomiting, diarrhea, and her vital signs are stable and do not believe that these are absolutely necessary at this time, can consider treatment with Zofran and follow-up with her primary care for further evaluation.  Encouraged her to continue taking her antibiotics, and steroids that she was recently prescribed for her acute bronchitis, and will refill her Zofran and albuterol prescription at this time.  Patient reports that she agrees to this plan, does not believe that she needs to stay for blood work or further evaluation at this time.  I do not hear any focal consolidation on her exam, she does have a little bit of an expiratory wheezing on the right greater than left.   Social Determinants of Health:  None relevant.   Disposition:  After consideration of the diagnostic results and the patients response to treatment, I feel that the patient would benefit from Zofran, albuterol, and close follow-up with her primary care.  Encouraged follow-up if symptoms worsen or fail to improve despite treatment.  Encouraged her to continue her steroids, antibiotics, and use inhaler so that her bronchitis continues to resolve.  Patient discharged stable condition at this time, return precautions given.   Final Clinical Impression(s) / ED Diagnoses Final diagnoses:  None    Rx / DC Orders ED Discharge Orders     None         Dorien Chihuahua 10/16/21 1620    Margette Fast, MD 10/22/21 1757

## 2021-10-16 NOTE — Discharge Instructions (Addendum)
As we discussed you do have a little bit of lingering wheezing in your lung secondary to the acute bronchitis you are recently diagnosed with.  I recommend that you finish your antibiotics and steroid prescriptions.  I will prescribe you a refill of your nausea medication, and inhaler at this time.  I recommend that you follow-up with your primary care for recheck early next week.  If your symptoms worsen or fail to improve please return for further evaluation.

## 2021-10-16 NOTE — ED Triage Notes (Addendum)
Pt c/o nausea/decreased appetite-states she was seen here 12/24 dx with bronchitis-states she felt better-worse x 2 days-NAD-steady gait

## 2021-10-23 ENCOUNTER — Other Ambulatory Visit: Payer: Self-pay

## 2021-10-23 ENCOUNTER — Emergency Department (HOSPITAL_BASED_OUTPATIENT_CLINIC_OR_DEPARTMENT_OTHER): Payer: Medicare Other

## 2021-10-23 ENCOUNTER — Emergency Department (HOSPITAL_BASED_OUTPATIENT_CLINIC_OR_DEPARTMENT_OTHER)
Admission: EM | Admit: 2021-10-23 | Discharge: 2021-10-24 | Disposition: A | Discharge status: Home / Self Care | Payer: Medicare Other | Attending: Emergency Medicine | Admitting: Emergency Medicine

## 2021-10-23 ENCOUNTER — Encounter (HOSPITAL_BASED_OUTPATIENT_CLINIC_OR_DEPARTMENT_OTHER): Payer: Self-pay

## 2021-10-23 DIAGNOSIS — I10 Essential (primary) hypertension: Secondary | ICD-10-CM | POA: Diagnosis not present

## 2021-10-23 DIAGNOSIS — J45909 Unspecified asthma, uncomplicated: Secondary | ICD-10-CM | POA: Insufficient documentation

## 2021-10-23 DIAGNOSIS — K8689 Other specified diseases of pancreas: Secondary | ICD-10-CM | POA: Diagnosis not present

## 2021-10-23 DIAGNOSIS — K869 Disease of pancreas, unspecified: Secondary | ICD-10-CM | POA: Diagnosis not present

## 2021-10-23 DIAGNOSIS — R1110 Vomiting, unspecified: Secondary | ICD-10-CM | POA: Diagnosis not present

## 2021-10-23 DIAGNOSIS — I700 Atherosclerosis of aorta: Secondary | ICD-10-CM | POA: Diagnosis not present

## 2021-10-23 DIAGNOSIS — Z79899 Other long term (current) drug therapy: Secondary | ICD-10-CM | POA: Insufficient documentation

## 2021-10-23 DIAGNOSIS — R112 Nausea with vomiting, unspecified: Secondary | ICD-10-CM | POA: Diagnosis not present

## 2021-10-23 DIAGNOSIS — Z743 Need for continuous supervision: Secondary | ICD-10-CM | POA: Diagnosis not present

## 2021-10-23 DIAGNOSIS — R110 Nausea: Secondary | ICD-10-CM | POA: Diagnosis not present

## 2021-10-23 DIAGNOSIS — E876 Hypokalemia: Secondary | ICD-10-CM | POA: Insufficient documentation

## 2021-10-23 DIAGNOSIS — R6889 Other general symptoms and signs: Secondary | ICD-10-CM | POA: Diagnosis not present

## 2021-10-23 DIAGNOSIS — R1111 Vomiting without nausea: Secondary | ICD-10-CM | POA: Diagnosis not present

## 2021-10-23 LAB — CBC WITH DIFFERENTIAL/PLATELET
Abs Immature Granulocytes: 0.01 10*3/uL (ref 0.00–0.07)
Basophils Absolute: 0.1 10*3/uL (ref 0.0–0.1)
Basophils Relative: 1 %
Eosinophils Absolute: 0.1 10*3/uL (ref 0.0–0.5)
Eosinophils Relative: 1 %
HCT: 36.5 % (ref 36.0–46.0)
Hemoglobin: 12.8 g/dL (ref 12.0–15.0)
Immature Granulocytes: 0 %
Lymphocytes Relative: 42 %
Lymphs Abs: 2.9 10*3/uL (ref 0.7–4.0)
MCH: 32.2 pg (ref 26.0–34.0)
MCHC: 35.1 g/dL (ref 30.0–36.0)
MCV: 91.7 fL (ref 80.0–100.0)
Monocytes Absolute: 0.7 10*3/uL (ref 0.1–1.0)
Monocytes Relative: 10 %
Neutro Abs: 3.2 10*3/uL (ref 1.7–7.7)
Neutrophils Relative %: 46 %
Platelets: 317 10*3/uL (ref 150–400)
RBC: 3.98 MIL/uL (ref 3.87–5.11)
RDW: 12.7 % (ref 11.5–15.5)
WBC: 6.9 10*3/uL (ref 4.0–10.5)
nRBC: 0 % (ref 0.0–0.2)

## 2021-10-23 LAB — URINALYSIS, ROUTINE W REFLEX MICROSCOPIC
Bilirubin Urine: NEGATIVE
Glucose, UA: NEGATIVE mg/dL
Ketones, ur: NEGATIVE mg/dL
Nitrite: NEGATIVE
Protein, ur: NEGATIVE mg/dL
Specific Gravity, Urine: 1.02 (ref 1.005–1.030)
pH: 7 (ref 5.0–8.0)

## 2021-10-23 LAB — URINALYSIS, MICROSCOPIC (REFLEX)

## 2021-10-23 LAB — COMPREHENSIVE METABOLIC PANEL
ALT: 11 U/L (ref 0–44)
AST: 15 U/L (ref 15–41)
Albumin: 3.8 g/dL (ref 3.5–5.0)
Alkaline Phosphatase: 77 U/L (ref 38–126)
Anion gap: 13 (ref 5–15)
BUN: 14 mg/dL (ref 8–23)
CO2: 21 mmol/L — ABNORMAL LOW (ref 22–32)
Calcium: 9.1 mg/dL (ref 8.9–10.3)
Chloride: 102 mmol/L (ref 98–111)
Creatinine, Ser: 0.44 mg/dL (ref 0.44–1.00)
GFR, Estimated: >60 mL/min (ref >60)
Glucose, Bld: 111 mg/dL — ABNORMAL HIGH (ref 70–99)
Potassium: 2.6 mmol/L — CL (ref 3.5–5.1)
Sodium: 136 mmol/L (ref 135–145)
Total Bilirubin: 0.7 mg/dL (ref 0.3–1.2)
Total Protein: 6.6 g/dL (ref 6.5–8.1)

## 2021-10-23 LAB — TROPONIN I (HIGH SENSITIVITY): Troponin I (High Sensitivity): 8 ng/L (ref <18)

## 2021-10-23 LAB — LIPASE, BLOOD: Lipase: 32 U/L (ref 11–51)

## 2021-10-23 MED ORDER — METOCLOPRAMIDE HCL 5 MG/ML IJ SOLN
10.0000 mg | Freq: Once | INTRAMUSCULAR | Status: AC
  Administered 2021-10-23: 10 mg via INTRAVENOUS
  Filled 2021-10-23: qty 2

## 2021-10-23 MED ORDER — LACTATED RINGERS IV BOLUS
1000.0000 mL | Freq: Once | INTRAVENOUS | Status: AC
  Administered 2021-10-23: 1000 mL via INTRAVENOUS

## 2021-10-23 MED ORDER — LACTATED RINGERS IV SOLN: INTRAVENOUS | Status: DC

## 2021-10-23 MED ORDER — ONDANSETRON 4 MG PO TBDP
4.0000 mg | ORAL_TABLET | Freq: Once | ORAL | Status: AC
  Administered 2021-10-23: 4 mg via ORAL
  Filled 2021-10-23: qty 1

## 2021-10-23 MED ORDER — POTASSIUM CHLORIDE 10 MEQ/100ML IV SOLN
10.0000 meq | INTRAVENOUS | Status: DC
  Administered 2021-10-23: 10 meq via INTRAVENOUS
  Filled 2021-10-23: qty 100

## 2021-10-23 MED ORDER — ONDANSETRON 4 MG PO TBDP
4.0000 mg | ORAL_TABLET | Freq: Once | ORAL | Status: AC
  Administered 2021-10-24: 4 mg via ORAL
  Filled 2021-10-23: qty 1

## 2021-10-23 MED ORDER — POTASSIUM CHLORIDE CRYS ER 20 MEQ PO TBCR: 20.0000 meq | EXTENDED_RELEASE_TABLET | Freq: Two times a day (BID) | ORAL | 1 refills | Status: DC

## 2021-10-23 MED ORDER — LORAZEPAM 2 MG/ML IJ SOLN
0.5000 mg | Freq: Once | INTRAMUSCULAR | Status: AC
  Administered 2021-10-23: 0.5 mg via INTRAVENOUS
  Filled 2021-10-23: qty 1

## 2021-10-23 NOTE — ED Notes (Signed)
Pt ambulatory to bathroom, standby assistance. Tolerating PO intake at this time.

## 2021-10-23 NOTE — ED Notes (Signed)
Per EDP [place] request, due to lack of alternative options, lab work collected from top of pts left foot. Pt tolerated well.

## 2021-10-23 NOTE — ED Notes (Signed)
Pts son Jacki Cones, notified of pts d/c status, per pt request. Jacki Cones reports pts granddaughter, Cyril Mourning, will come to pick up pt and should be here in about 25 min. Pt made aware.

## 2021-10-23 NOTE — ED Notes (Signed)
IV start attempted x2, unsuccessful.  2nd RN requested for IV start and bloodwork collection.

## 2021-10-23 NOTE — ED Provider Notes (Signed)
[facility] Provider Note   CSN: [id number] Arrival date & time: 10/23/21  1656     History  Chief Complaint  Patient presents with   Emesis    [name] is a 76 y.o. female.  Patient is a 76 year old female with a history of PVD, hypertension, GERD, hyperlipidemia, asthma who is returning today due to nausea and vomiting.  Patient reports that she has been here at least 1 time a week for the last 4 weeks for various issues.  She was having bronchitis type symptoms and required an inhaler and medications which that has seemed to improve but she was here on 10/16/2021 for nausea and vomiting.  At that time she did improve with medications and went home but reported she has been intermittently nauseated and then woke up this morning feeling nauseated and has had persistent vomiting.  She has no localized abdominal pain but just feels generally unwell.  She denies chest pain, shortness of breath, cough at this time.  She is still been taking her home medications but never filled the prescription for the nausea medication that was prescribed the last time she was here.  She has not noticed any new swelling in her lower extremities and denies any change in her medications.  She has been having normal bowel movements and had 1 today.  She has not had any diarrhea  The history is provided by the patient and medical records.  Emesis     Home Medications Prior to Admission medications   Medication Sig Start Date End Date Taking? Authorizing Provider  acetaminophen (TYLENOL) 325 MG tablet Take 650 mg by mouth every 6 (six) hours as needed for mild pain or moderate pain.     [provider]  albuterol (VENTOLIN HFA) 108 (90 Base) MCG/ACT inhaler Inhale 1-2 puffs into the lungs every 6 (six) hours as needed for wheezing or shortness of breath. 10/16/21   Prosperi, Christian H, PA-C  alum & mag hydroxide-simeth (MAALOX/MYLANTA) 200-200-20 MG/5ML suspension  Take 15 mLs by mouth every 4 (four) hours as needed for indigestion or heartburn. 04/15/21   Aline August, MD  amLODipine (NORVASC) 5 MG tablet Take 1 tablet (5 mg total) by mouth daily. 05/29/21   [name], MD  atorvastatin (LIPITOR) 20 MG tablet Take 1 tablet (20 mg total) by mouth daily at 6 PM. 05/29/21   [name], MD  clotrimazole (LOTRIMIN) 1 % cream Apply 1 application topically 2 (two) times daily. 05/29/21   [name], MD  Dextromethorphan-guaiFENesin [facility] DM MAXIMUM STRENGTH) 60-1200 MG TB12 Take 1 tablet by mouth daily as needed (chest congestion). Patient not taking: Reported on 06/22/2021    [provider]  fenofibrate (TRICOR) 145 MG tablet Take 1 tablet (145 mg total) by mouth daily. 08/12/20   [name], MD  fluticasone-salmeterol (ADVAIR HFA) [phone] MCG/ACT inhaler Inhale 2 puffs into the lungs 2 (two) times daily. 05/29/21   [name], MD  ibuprofen (ADVIL,MOTRIN) 100 MG tablet Take 200 mg by mouth every 6 (six) hours as needed for pain.    [provider]  olmesartan (BENICAR) 20 MG tablet Take 1 tablet (20 mg total) by mouth daily. 05/29/21   [name], MD  ondansetron (ZOFRAN ODT) 4 MG disintegrating tablet Take 1 tablet (4 mg total) by mouth every 8 (eight) hours as needed for nausea or vomiting. 04/15/21   Aline August, MD  ondansetron (ZOFRAN) 4 MG tablet Take  1 tablet (4 mg total) by mouth every 6 (six) hours. 10/16/21   Prosperi, Christian H, PA-C  oxymetazoline (AFRIN) 0.05 % nasal spray Place 1 spray into both nostrils daily as needed for congestion.    [provider]  pantoprazole (PROTONIX) 40 MG tablet Take 1 tablet (40 mg total) by mouth 2 (two) times daily. Patient not taking: Reported on 06/22/2021 04/15/21   Aline August, MD  potassium chloride SA (KLOR-CON) 20 MEQ tablet Take 1 tablet (20 mEq total) by mouth daily. After initial dose of 2 tablets by mouth once. 05/30/21   [name], MD   Respiratory Therapy Supplies (FLUTTER) DEVI 1 each by Does not apply route 2 (two) times daily. 07/15/19   Julian Hy, DO      Allergies    Dilaudid [hydromorphone hcl] and Morphine and related    Review of Systems   Review of Systems  Gastrointestinal:  Positive for vomiting.   Physical Exam Updated Vital Signs BP (!) 158/77    Pulse 83    Temp 98.2 F (36.8 C)    Resp 17    Ht 5\' 5"  (1.651 m)    Wt 66.2 kg    SpO2 93%    BMI 24.30 kg/m  Physical Exam Vitals and nursing note reviewed.  Constitutional:      General: She is not in acute distress.    Appearance: She is well-developed. She is ill-appearing.  HENT:     Head: Normocephalic and atraumatic.     Mouth/Throat:     Mouth: Mucous membranes are dry.  Eyes:     Pupils: Pupils are equal, round, and reactive to light.  Cardiovascular:     Rate and Rhythm: Normal rate and regular rhythm.     Heart sounds: Normal heart sounds. No murmur heard.   No friction rub.  Pulmonary:     Effort: Pulmonary effort is normal.     Breath sounds: Normal breath sounds. No wheezing or rales.  Abdominal:     General: There is no distension.     Palpations: Abdomen is soft.     Tenderness: There is no abdominal tenderness. There is no guarding or rebound.     Comments: Decreased bowel sounds  Musculoskeletal:        General: No tenderness. Normal range of motion.     Cervical back: Normal range of motion and neck supple.     Right lower leg: No edema.     Left lower leg: No edema.     Comments: No edema  Skin:    General: Skin is warm and dry.     Findings: No rash.  Neurological:     Mental Status: She is alert and oriented to person, place, and time. Mental status is at baseline.     Cranial Nerves: No cranial nerve deficit.  Psychiatric:        Mood and Affect: Mood normal.        Behavior: Behavior normal.    ED Results / Procedures / Treatments   Labs (all labs ordered are listed, but only abnormal results are  displayed) Labs Reviewed  URINALYSIS, ROUTINE W REFLEX MICROSCOPIC - Abnormal; Notable for the following components:      Result Value   Hgb urine dipstick TRACE (*)    Leukocytes,Ua SMALL (*)    All other components within normal limits  URINALYSIS, MICROSCOPIC (REFLEX) - Abnormal; Notable for the following components:   Bacteria, UA FEW (*)  All other components within normal limits  COMPREHENSIVE METABOLIC PANEL - Abnormal; Notable for the following components:   Potassium 2.6 (*)    CO2 21 (*)    Glucose, Bld 111 (*)    All other components within normal limits  CBC WITH DIFFERENTIAL/PLATELET  LIPASE, BLOOD  TROPONIN I (HIGH SENSITIVITY)    EKG EKG Interpretation  Date/Time:  Monday October 23 2021 18:15:38 EST Ventricular Rate:  87 PR Interval:  149 QRS Duration: 89 QT Interval:  367 QTC Calculation: 442 R Axis:   58 Text Interpretation: Sinus rhythm Probable left atrial enlargement Low voltage, precordial leads No significant change since last tracing Confirmed by Blanchie Dessert [id number]) on 10/23/2021 6:44:18 PM  Radiology CT ABDOMEN PELVIS WO CONTRAST  Result Date: 10/23/2021 CLINICAL DATA:  Vomiting nausea EXAM: CT ABDOMEN AND PELVIS WITHOUT CONTRAST TECHNIQUE: Multidetector CT imaging of the abdomen and pelvis was performed following the standard protocol without IV contrast. COMPARISON:  CT 01/27/2021, 01/03/2021, PET CT 02/15/2019 FINDINGS: Lower chest: Lung bases demonstrate no acute consolidation or effusion. Stable subpleural reticulation in the right lower lobe with 9 mm nodular density. Partially visualized breast prostheses. Hepatobiliary: No focal liver abnormality is seen. No gallstones, gallbladder wall thickening, or biliary dilatation. Pancreas: No inflammatory changes. 11 mm slightly dense mass in the distal pancreas, series 2, image 20 corresponding to history of neuroendocrine tumor. Spleen: Normal in size without focal abnormality. Adrenals/Urinary Tract:  Adrenal glands are normal. Kidneys show no hydronephrosis. The bladder is normal Stomach/Bowel: The stomach is nonenlarged. No dilated small bowel. No acute bowel wall thickening. Fleck like radiopaque material in the colon. Negative appendix. Vascular/Lymphatic: Aortic atherosclerosis. No enlarged abdominal or pelvic lymph nodes. Reproductive: Status post hysterectomy. No adnexal masses. Other: Negative for pelvic effusion or free air Musculoskeletal: Scoliosis and degenerative changes of the spine IMPRESSION: 1. No CT evidence for acute intra-abdominal or pelvic abnormality. 2. 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 Electronically Signed   By: Donavan Foil M.D.   On: 10/23/2021 20:56   DG Chest Port 1 View  Result Date: 10/23/2021 CLINICAL DATA:  Vomiting EXAM: PORTABLE CHEST 1 VIEW COMPARISON:  10/07/2021 FINDINGS: The heart size and mediastinal contours are within normal limits. Both lungs are clear. Multiple old right-sided rib deformities. Left shoulder replacement. IMPRESSION: No active disease. Electronically Signed   By: Donavan Foil M.D.   On: 10/23/2021 17:50    Procedures Procedures  {Document cardiac monitor, telemetry assessment procedure when appropriate:1 Patient was placed on cardiac monitor throughout her stay and after discovering she had hypokalemia.  No dysrhythmias noted  Medications Ordered in ED Medications  lactated ringers infusion ( Intravenous New Bag/Given 10/23/21 2214)  potassium chloride 10 mEq in 100 mL IVPB (10 mEq Intravenous New Bag/Given 10/23/21 2210)  ondansetron (ZOFRAN-ODT) disintegrating tablet 4 mg (has no administration in time range)  lactated ringers bolus 1,000 mL (0 mLs Intravenous Stopped 10/23/21 2216)  metoCLOPramide (REGLAN) injection 10 mg (10 mg Intravenous Given 10/23/21 1840)  ondansetron (ZOFRAN-ODT) disintegrating tablet 4 mg (4 mg Oral Given 10/23/21 1928)  LORazepam (ATIVAN) injection 0.5 mg  (0.5 mg Intravenous Given 10/23/21 1959)    ED Course/ Medical Decision Making/ A&P                           Medical Decision Making  Patient is an elderly female presenting today with persistent nausea and vomiting.  Patient was  seen here approximately 7 days ago for similar symptoms.  At that time she had normal labs and was thought to have persistent bronchitis.  Today on exam patient is not wheezing and denies any respiratory symptoms.  She is satting 98% on room air.  She has no localized abdominal pain and does report that she has been having bowel movements however with the recurrent nausea and vomiting and no prior images also with complaint that she has had weight loss over the last year we will do a CT to ensure there is no evidence of mass, obstruction or other acute abnormality.  Patient given IV fluids due to the concern for dehydration and antiemetics.  Patient does not have a history of diabetes but is prediabetic.  Lower suspicion for gastroparesis at this time.  She denies any GERD symptoms but has recently had an illness and this could be manifestations.  We will get an EKG to screen for atypical ACS.  11:02 PM I independently evaluated and interpreted patient's labs and they are consistent with a hypokalemia with a potassium of 2.6, with normal renal function of 0.44 and normal LFTs and total bilirubin.  CBC is within normal limits.  Lipase and troponin are within normal limits and UA has no suggestive findings of a UTI.  I evaluated the radiologist read of the patient's CAT scan she has no evidence of acute abnormality at this time.  She does have an 11 mm slightly dense mass in her distal pancreas which corresponds to a known neuroendocrine tumor.  I personally evaluated patient's EKG and she has no acute findings today.  Patient was given 1 round of IV potassium and IV fluids as well as antiemetics.  She has eaten 3 packs of crackers and drank fluids.  Recommended that we do 2 more  runs of potassium but patient refuses and reports that she will be fine and she would like to go home.  She has a prescription for Zofran that she can pick up at the pharmacy but request that we send 1 home with her in case she becomes nauseated overnight.  Also she was sent a prescription for potassium to the pharmacy.  She reports her doctor had given her a 1 week course of potassium which she had finished but discussed with her that she might need to be on it more long-term.  She does appear stable for discharge at this time.  She was given return precautions.        Final Clinical Impression(s) / ED Diagnoses Final diagnoses:  Hypokalemia  Nausea and vomiting, unspecified vomiting type    Rx / DC Orders ED Discharge Orders          Ordered    potassium chloride SA (KLOR-CON M) 20 MEQ tablet  2 times daily        10/23/21 2307              Blanchie Dessert, MD 10/23/21 2308

## 2021-10-23 NOTE — ED Notes (Signed)
Pt provided ginger ale and saltine crackers for PO challenge. EDP [place] aware.

## 2021-10-23 NOTE — Discharge Instructions (Signed)
Make sure you are trying to eat foods that have potassium in them and you are also given a prescription for 1 week of potassium.  This might be a medicine that you need to be on more regularly.  If you start having fever, severe pain in your abdomen please return to the emergency room.

## 2021-10-23 NOTE — ED Notes (Signed)
X-ray at bedside

## 2021-10-23 NOTE — ED Triage Notes (Signed)
Pt arrives [facility] 190/100 BP after vomiting, 160/90 prior to vomiting, HR 80 NSR, 98% RA, CBG 120. Attempted IV, unable to obtain. Recent hospital visit last week for the same. Pt reports vomiting started again last night.

## 2021-10-23 NOTE — ED Notes (Signed)
Pt transported to CT ?

## 2021-10-24 DIAGNOSIS — E876 Hypokalemia: Secondary | ICD-10-CM | POA: Diagnosis not present

## 2021-10-24 NOTE — ED Notes (Signed)
D/c paperwork reviewed with pt, including prescription and f/u care.  All questions addressed prior to d/c. Pt assisted to transfer to wheelchair and wheeled to ED exit to meet ride.

## 2021-11-06 ENCOUNTER — Ambulatory Visit: Payer: Self-pay

## 2021-11-06 NOTE — Telephone Encounter (Signed)
°  Chief Complaint: numbness in fingers Symptoms: numbness in fingers on both hands, pt stated has had numbness in arms previously Frequency: started yesterday but comes and goes Pertinent Negatives: Patient denies chest pain, dizziness, SOB Disposition: [] ED /[] Urgent Care (no appt availability in office) / [] Appointment(In office/virtual)/ []  [facility]/ [] Home Care/ [] Refused Recommended Disposition /[] [facility]/ [x]  Follow-up with PCP Additional Notes: Advised pt to call PCP and schedule an appt or can go to UC to be seen.    Reason for Disposition  [1] Numbness or tingling in one or both hands AND [2] is a chronic symptom (recurrent or ongoing AND present > 4 weeks)  Answer Assessment - Initial Assessment Questions 1. SYMPTOM: "What is the main symptom you are concerned about?" (e.g., weakness, numbness)     Both hands, all fingers 2. ONSET: "When did this start?" (minutes, hours, days; while sleeping)     Yesterday,  3. LAST NORMAL: "When was the last time you (the patient) were normal (no symptoms)?"     Yesterday morning 4. PATTERN "Does this come and go, or has it been constant since it started?"  "Is it present now?"     Comes and go 5. CARDIAC SYMPTOMS: "Have you had any of the following symptoms: chest pain, difficulty breathing, palpitations?"     No 6. NEUROLOGIC SYMPTOMS: "Have you had any of the following symptoms: headache, dizziness, vision loss, double vision, changes in speech, unsteady on your feet?"     numbness 7. OTHER SYMPTOMS: "Do you have any other symptoms?"     No  Protocols used: Neurologic Deficit-A-AH

## 2021-11-22 ENCOUNTER — Emergency Department (HOSPITAL_COMMUNITY)
Admission: EM | Admit: 2021-11-22 | Discharge: 2021-11-22 | Disposition: A | Discharge status: Home / Self Care | Payer: Medicare Other | Attending: Emergency Medicine | Admitting: Emergency Medicine

## 2021-11-22 ENCOUNTER — Emergency Department (HOSPITAL_COMMUNITY): Payer: Medicare Other

## 2021-11-22 ENCOUNTER — Other Ambulatory Visit: Payer: Self-pay

## 2021-11-22 DIAGNOSIS — R109 Unspecified abdominal pain: Secondary | ICD-10-CM | POA: Insufficient documentation

## 2021-11-22 DIAGNOSIS — Z9011 Acquired absence of right breast and nipple: Secondary | ICD-10-CM | POA: Diagnosis not present

## 2021-11-22 DIAGNOSIS — I10 Essential (primary) hypertension: Secondary | ICD-10-CM | POA: Diagnosis not present

## 2021-11-22 DIAGNOSIS — J45909 Unspecified asthma, uncomplicated: Secondary | ICD-10-CM | POA: Diagnosis not present

## 2021-11-22 DIAGNOSIS — Z96612 Presence of left artificial shoulder joint: Secondary | ICD-10-CM | POA: Insufficient documentation

## 2021-11-22 DIAGNOSIS — R0603 Acute respiratory distress: Secondary | ICD-10-CM | POA: Diagnosis not present

## 2021-11-22 DIAGNOSIS — R059 Cough, unspecified: Secondary | ICD-10-CM | POA: Insufficient documentation

## 2021-11-22 DIAGNOSIS — R000 Tachycardia, unspecified: Secondary | ICD-10-CM | POA: Diagnosis not present

## 2021-11-22 DIAGNOSIS — R6889 Other general symptoms and signs: Secondary | ICD-10-CM | POA: Diagnosis not present

## 2021-11-22 DIAGNOSIS — R7303 Prediabetes: Secondary | ICD-10-CM | POA: Diagnosis not present

## 2021-11-22 DIAGNOSIS — R110 Nausea: Secondary | ICD-10-CM | POA: Diagnosis not present

## 2021-11-22 DIAGNOSIS — R062 Wheezing: Secondary | ICD-10-CM | POA: Insufficient documentation

## 2021-11-22 DIAGNOSIS — Z8507 Personal history of malignant neoplasm of pancreas: Secondary | ICD-10-CM | POA: Insufficient documentation

## 2021-11-22 DIAGNOSIS — Z79899 Other long term (current) drug therapy: Secondary | ICD-10-CM | POA: Diagnosis not present

## 2021-11-22 DIAGNOSIS — Z20822 Contact with and (suspected) exposure to covid-19: Secondary | ICD-10-CM | POA: Diagnosis not present

## 2021-11-22 DIAGNOSIS — Z96653 Presence of artificial knee joint, bilateral: Secondary | ICD-10-CM | POA: Insufficient documentation

## 2021-11-22 DIAGNOSIS — R112 Nausea with vomiting, unspecified: Secondary | ICD-10-CM | POA: Diagnosis not present

## 2021-11-22 DIAGNOSIS — R0602 Shortness of breath: Secondary | ICD-10-CM | POA: Diagnosis not present

## 2021-11-22 DIAGNOSIS — Z743 Need for continuous supervision: Secondary | ICD-10-CM | POA: Diagnosis not present

## 2021-11-22 DIAGNOSIS — R531 Weakness: Secondary | ICD-10-CM | POA: Diagnosis present

## 2021-11-22 LAB — URINALYSIS, ROUTINE W REFLEX MICROSCOPIC
Bilirubin Urine: NEGATIVE
Glucose, UA: NEGATIVE mg/dL
Ketones, ur: 5 mg/dL — AB
Nitrite: NEGATIVE
Protein, ur: NEGATIVE mg/dL
Specific Gravity, Urine: 1.018 (ref 1.005–1.030)
pH: 5 (ref 5.0–8.0)

## 2021-11-22 LAB — CBC WITH DIFFERENTIAL/PLATELET
Abs Immature Granulocytes: 0.02 10*3/uL (ref 0.00–0.07)
Basophils Absolute: 0.1 10*3/uL (ref 0.0–0.1)
Basophils Relative: 1 %
Eosinophils Absolute: 0.9 10*3/uL — ABNORMAL HIGH (ref 0.0–0.5)
Eosinophils Relative: 12 %
HCT: 40.5 % (ref 36.0–46.0)
Hemoglobin: 13.2 g/dL (ref 12.0–15.0)
Immature Granulocytes: 0 %
Lymphocytes Relative: 11 %
Lymphs Abs: 0.8 10*3/uL (ref 0.7–4.0)
MCH: 31.8 pg (ref 26.0–34.0)
MCHC: 32.6 g/dL (ref 30.0–36.0)
MCV: 97.6 fL (ref 80.0–100.0)
Monocytes Absolute: 0.7 10*3/uL (ref 0.1–1.0)
Monocytes Relative: 9 %
Neutro Abs: 5 10*3/uL (ref 1.7–7.7)
Neutrophils Relative %: 67 %
Platelets: 323 10*3/uL (ref 150–400)
RBC: 4.15 MIL/uL (ref 3.87–5.11)
RDW: 11.9 % (ref 11.5–15.5)
WBC: 7.5 10*3/uL (ref 4.0–10.5)
nRBC: 0 % (ref 0.0–0.2)

## 2021-11-22 LAB — RESP PANEL BY RT-PCR (FLU A&B, COVID) ARPGX2
Influenza A by PCR: NEGATIVE
Influenza B by PCR: NEGATIVE
SARS Coronavirus 2 by RT PCR: NEGATIVE

## 2021-11-22 LAB — COMPREHENSIVE METABOLIC PANEL
ALT: 17 U/L (ref 0–44)
AST: 20 U/L (ref 15–41)
Albumin: 4.1 g/dL (ref 3.5–5.0)
Alkaline Phosphatase: 87 U/L (ref 38–126)
Anion gap: 13 (ref 5–15)
BUN: 12 mg/dL (ref 8–23)
CO2: 23 mmol/L (ref 22–32)
Calcium: 9.2 mg/dL (ref 8.9–10.3)
Chloride: 100 mmol/L (ref 98–111)
Creatinine, Ser: 0.6 mg/dL (ref 0.44–1.00)
GFR, Estimated: >60 mL/min (ref >60)
Glucose, Bld: 104 mg/dL — ABNORMAL HIGH (ref 70–99)
Potassium: 3.6 mmol/L (ref 3.5–5.1)
Sodium: 136 mmol/L (ref 135–145)
Total Bilirubin: 0.3 mg/dL (ref 0.3–1.2)
Total Protein: 7.1 g/dL (ref 6.5–8.1)

## 2021-11-22 LAB — LIPASE, BLOOD: Lipase: 29 U/L (ref 11–51)

## 2021-11-22 MED ORDER — SODIUM CHLORIDE 0.9 % IV BOLUS
500.0000 mL | Freq: Once | INTRAVENOUS | Status: AC
  Administered 2021-11-22: 500 mL via INTRAVENOUS

## 2021-11-22 MED ORDER — PROCHLORPERAZINE EDISYLATE 10 MG/2ML IJ SOLN
10.0000 mg | Freq: Once | INTRAMUSCULAR | Status: AC
  Administered 2021-11-22: 10 mg via INTRAVENOUS
  Filled 2021-11-22: qty 2

## 2021-11-22 MED ORDER — PROCHLORPERAZINE MALEATE 10 MG PO TABS: 10.0000 mg | ORAL_TABLET | Freq: Three times a day (TID) | ORAL | 0 refills | Status: DC | PRN

## 2021-11-22 MED ORDER — SODIUM CHLORIDE 0.9 % IV BOLUS
1000.0000 mL | Freq: Once | INTRAVENOUS | Status: AC
  Administered 2021-11-22: 1000 mL via INTRAVENOUS

## 2021-11-22 MED ORDER — ONDANSETRON HCL 4 MG/2ML IJ SOLN
4.0000 mg | Freq: Once | INTRAMUSCULAR | Status: AC
  Administered 2021-11-22: 4 mg via INTRAVENOUS
  Filled 2021-11-22: qty 2

## 2021-11-22 MED ORDER — IPRATROPIUM BROMIDE 0.02 % IN SOLN
0.5000 mg | Freq: Once | RESPIRATORY_TRACT | Status: AC
  Administered 2021-11-22: 0.5 mg via RESPIRATORY_TRACT
  Filled 2021-11-22: qty 2.5

## 2021-11-22 MED ORDER — ALBUTEROL SULFATE (2.5 MG/3ML) 0.083% IN NEBU
5.0000 mg | INHALATION_SOLUTION | Freq: Once | RESPIRATORY_TRACT | Status: AC
  Administered 2021-11-22: 5 mg via RESPIRATORY_TRACT
  Filled 2021-11-22: qty 6

## 2021-11-22 MED ORDER — ONDANSETRON 4 MG PO TBDP: 4.0000 mg | ORAL_TABLET | Freq: Three times a day (TID) | ORAL | 0 refills | Status: DC | PRN

## 2021-11-22 NOTE — ED Triage Notes (Signed)
Pt arrived via [facility] from home. Per EMS pt c/o malaise, productive cough, nausea, decreased appetite, and SOB for approx 4-5 days. EMS further reports pt had expiratory wheezing and administered 5mg  albuterol neb which improved wheezing but is still present. Pt arrived to ED caox4. SpO2 WNL on RA. Pt states she was recently treated "for an infection in her lung."

## 2021-11-22 NOTE — ED Notes (Signed)
Pt ambulated with assistance to the restroom.

## 2021-11-22 NOTE — ED Provider Notes (Addendum)
[facility] EMERGENCY DEPARTMENT Provider Note   CSN: [id number] Arrival date & time: 11/22/21  1558     History  Chief Complaint  Patient presents with   Weakness   Respiratory Distress    [name] is a 76 y.o. female.   Weakness Associated symptoms: no shortness of breath   Patient presents with weakness cough feeling bad nausea vomiting.  Began a week ago.  States has not lost her appetite but also states she has not eaten anything because she has no appetite.  No diarrhea but has been vomiting.  States she has had 3 episodes of this in the last few months.  No known sick contacts.  History of pneumonia.  Giving breathing treatments by EMS.  States has had a cough with some sputum production with either clear or yellow sputum at times.  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)    has spider veins   Pneumonia    Pneumothorax, traumatic    RIGHT; associated with multiple rib fractures after a fall   PONV (postoperative nausea and vomiting)    Pre-diabetes    Past Surgical History:  Procedure Laterality Date   ABDOMINAL HYSTERECTOMY  1982   AUGMENTATION MAMMAPLASTY Bilateral    Patient has bilateral implants    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   BREAST SURGERY     ESOPHAGOGASTRODUODENOSCOPY (EGD) WITH PROPOFOL N/A 01/05/2020   Procedure: ESOPHAGOGASTRODUODENOSCOPY (EGD) WITH PROPOFOL;  Surgeon: Arta Silence, MD;  Location: [facility];  Service: Endoscopy;  Laterality: N/A;   EUS N/A 01/05/2020   Procedure: UPPER ENDOSCOPIC ULTRASOUND (EUS) LINEAR;  Surgeon: Arta Silence, MD;  Location: [facility];  Service: Endoscopy;  Laterality: N/A;   FINE NEEDLE ASPIRATION N/A 01/05/2020   Procedure: FINE  NEEDLE ASPIRATION (FNA) LINEAR;  Surgeon: Arta Silence, MD;  Location: [facility];  Service: Endoscopy;  Laterality: N/A;   JOINT REPLACEMENT  2009   right knee   JOINT REPLACEMENT  2011   left knee   MASTECTOMY  2008   right breast   REDUCTION MAMMAPLASTY Left    Patient had lift with implant on Left side    SHOULDER ARTHROSCOPY DISTAL CLAVICLE EXCISION AND OPEN ROTATOR CUFF REPAIR  2011   TOTAL SHOULDER ARTHROPLASTY  03/14/2012   Procedure: TOTAL SHOULDER ARTHROPLASTY;  Surgeon: Alta Corning, MD;  Location: [facility];  Service: Orthopedics;  Laterality: Left;     Home Medications Prior to Admission medications   Medication Sig Start Date End Date Taking? Authorizing Provider  acetaminophen (TYLENOL) 325 MG tablet Take 650 mg by mouth every 6 (six) hours as needed for mild pain or moderate pain.    Yes [provider]  albuterol (VENTOLIN HFA) 108 (90 Base) MCG/ACT inhaler Inhale 1-2 puffs into the lungs every 6 (six) hours as needed for wheezing or shortness of breath. 10/16/21  Yes Prosperi, Christian H, PA-C  alum & mag hydroxide-simeth (MAALOX/MYLANTA) 200-200-20 MG/5ML suspension Take 15 mLs by mouth every 4 (four) hours as needed for indigestion or heartburn. 04/15/21  Yes Aline August, MD  amLODipine (NORVASC) 5 MG tablet Take 1 tablet (5 mg total) by mouth  daily. 05/29/21  Yes [name], MD  atorvastatin (LIPITOR) 20 MG tablet Take 1 tablet (20 mg total) by mouth daily at 6 PM. 05/29/21  Yes [name], MD  prochlorperazine (COMPAZINE) 10 MG tablet Take 1 tablet (10 mg total) by mouth every 8 (eight) hours as needed for nausea or vomiting. 11/22/21  Yes Davonna Belling, MD  clotrimazole (LOTRIMIN) 1 % cream Apply 1 application topically 2 (two) times daily. 05/29/21   [name], MD  Dextromethorphan-guaiFENesin [facility] DM MAXIMUM STRENGTH) 60-1200 MG TB12 Take 1 tablet by mouth daily as needed (chest congestion). Patient not taking: Reported on 06/22/2021     [provider]  fenofibrate (TRICOR) 145 MG tablet Take 1 tablet (145 mg total) by mouth daily. Patient not taking: Reported on 11/22/2021 08/12/20   [name], MD  fluticasone-salmeterol (ADVAIR [facility]) [phone] MCG/ACT inhaler Inhale 2 puffs into the lungs 2 (two) times daily. 05/29/21   [name], MD  ibuprofen (ADVIL,MOTRIN) 100 MG tablet Take 200 mg by mouth every 6 (six) hours as needed for pain.    [provider]  olmesartan (BENICAR) 20 MG tablet Take 1 tablet (20 mg total) by mouth daily. 05/29/21   [name], MD  ondansetron (ZOFRAN ODT) 4 MG disintegrating tablet Take 1 tablet (4 mg total) by mouth every 8 (eight) hours as needed for nausea or vomiting. 11/22/21   Davonna Belling, MD  oxymetazoline (AFRIN) 0.05 % nasal spray Place 1 spray into both nostrils daily as needed for congestion.    [provider]  pantoprazole (PROTONIX) 40 MG tablet Take 1 tablet (40 mg total) by mouth 2 (two) times daily. Patient not taking: Reported on 06/22/2021 04/15/21   Aline August, MD  potassium chloride SA (KLOR-CON M) 20 MEQ tablet Take 1 tablet (20 mEq total) by mouth 2 (two) times daily. 10/23/21   Blanchie Dessert, MD  Respiratory Therapy Supplies (FLUTTER) DEVI 1 each by Does not apply route 2 (two) times daily. 07/15/19   Julian Hy, DO      Allergies    Dilaudid [hydromorphone hcl] and Morphine and related    Review of Systems   Review of Systems  Constitutional:  Negative for appetite change.  Respiratory:  Negative for shortness of breath.   Gastrointestinal:  Negative for abdominal distention.  Genitourinary:  Negative for flank pain.  Musculoskeletal:  Negative for back pain.  Neurological:  Positive for weakness.   Physical Exam Updated Vital Signs BP (!) 169/83    Pulse (!) 118    Temp 100.1 F (37.8 C) (Axillary)    Resp (!) 23    Ht 5\' 5"  (1.651 m)    Wt 63.5 kg    SpO2 94%    BMI 23.30 kg/m  Physical Exam Vitals and nursing  note reviewed.  HENT:     Head: Atraumatic.  Eyes:     Pupils: Pupils are equal, round, and reactive to light.  Cardiovascular:     Rate and Rhythm: Regular rhythm.  Pulmonary:     Breath sounds: Wheezing present.  Abdominal:     Tenderness: There is no abdominal tenderness.  Musculoskeletal:        General: No tenderness.     Cervical back: Neck supple.  Skin:    General: Skin is warm.     Capillary Refill: Capillary refill takes less than 2 seconds.  Neurological:     Mental Status: She is alert and oriented to person,  place, and time.    ED Results / Procedures / Treatments   Labs (all labs ordered are listed, but only abnormal results are displayed) Labs Reviewed  CBC WITH DIFFERENTIAL/PLATELET - Abnormal; Notable for the following components:      Result Value   Eosinophils Absolute 0.9 (*)    All other components within normal limits  COMPREHENSIVE METABOLIC PANEL - Abnormal; Notable for the following components:   Glucose, Bld 104 (*)    All other components within normal limits  URINALYSIS, ROUTINE W REFLEX MICROSCOPIC - Abnormal; Notable for the following components:   Hgb urine dipstick SMALL (*)    Ketones, ur 5 (*)    Leukocytes,Ua SMALL (*)    Bacteria, UA RARE (*)    All other components within normal limits  RESP PANEL BY RT-PCR (FLU A&B, COVID) ARPGX2  LIPASE, BLOOD    EKG EKG Interpretation  Date/Time:  Wednesday November 22 2021 16:03:19 EST Ventricular Rate:  100 PR Interval:  128 QRS Duration: 78 QT Interval:  344 QTC Calculation: 444 R Axis:   77 Text Interpretation: Sinus tachycardia Probable left atrial enlargement Confirmed by Davonna Belling [id number]) on 11/22/2021 4:19:40 PM  Radiology DG Chest Portable 1 View  Result Date: 11/22/2021 CLINICAL DATA:  Cough.  Malaise. EXAM: PORTABLE CHEST 1 VIEW COMPARISON:  AP chest 10/23/2021, CT chest 01/27/2021 FINDINGS: Cardiac silhouette and mediastinal contours are within normal limits. There are  again multiple lateral right mid to lower rib defects, possibly postsurgical versus posttraumatic. There is interstitial scarring again seen in this region. Amount of interstitial thickening in the inferior right lung is slightly increased from 10/23/2021 radiographs which may reflect differences in radiographic technique versus acute superimposed interstitial thickening. The left lung is clear. No pleural effusion or pneumothorax. Left shoulder arthroplasty. Severe right glenohumeral osteoarthritis. IMPRESSION: Chronic scarring within the inferior right lung in the region of multiple chronic postsurgical versus posttraumatic rib deformities. Mild interval increase in interstitial thickening in this region may represent mild edema versus infection superimposed on the chronic scarring. The patient does report being recently diagnosed with a lung infection. Electronically Signed   By: Yvonne Kendall M.D.   On: 11/22/2021 16:49    Procedures Procedures    Medications Ordered in ED Medications  sodium chloride 0.9 % bolus 1,000 mL (0 mLs Intravenous Stopped 11/22/21 1820)  ondansetron (ZOFRAN) injection 4 mg (4 mg Intravenous Given 11/22/21 1819)  prochlorperazine (COMPAZINE) injection 10 mg (10 mg Intravenous Given 11/22/21 1912)  albuterol (PROVENTIL) (2.5 MG/3ML) 0.083% nebulizer solution 5 mg (5 mg Nebulization Given 11/22/21 2037)  ipratropium (ATROVENT) nebulizer solution 0.5 mg (0.5 mg Nebulization Given 11/22/21 2037)  sodium chloride 0.9 % bolus 500 mL (500 mLs Intravenous New Bag/Given 11/22/21 2153)    ED Course/ Medical Decision Making/ A&P                           Medical Decision Making Problems Addressed: Nausea and vomiting, unspecified vomiting type: acute illness or injury  Amount and/or Complexity of Data Reviewed External Data Reviewed: notes.    Details: Previous ER visits. Labs: ordered. Decision-making details documented in ED Course. Radiology: ordered and independent  interpretation performed. Decision-making details documented in ED Course.    Details: Chronic changes.  X-ray shows edema versus infection.  No localized finding in this on exam however.  Improved after breathing treatment.  Do not think we need  treatment the lungs at this point ECG/medicine  tests: independent interpretation performed.  Risk Prescription drug management.   Patient presents with URI symptoms nausea and vomiting.  Also some abdominal pain.  Initial differential diagnosis is long and includes URI, pneumonia, obstruction, gastroenteritis. Patient's had a history of same but presents with nausea vomiting shortness of breath.  Has had a little bit of cough.  States she is really not eaten in the last few days.  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  Lab work interpreted and surprisingly reassuring.  Not overly dehydrated.  Without dysuria but urine does have some white cells.  Culture sent.  Rather benign abdominal exam.  Remained tachycardic and fluid bolus given.  Initial Zofran did not work but Compazine added.  Feels better.  Tolerated orals.  Patient is eager to go home.  Will discharge with outpatient follow-up as needed.  Will give Compazine and Zofran for home. Do not feel as if I need a new CT scan.  Multiple previous CT scan and reports reviewed and do not show obstruction in the past. Negative COVID testing.  Lungs clear.  Breathing improved after breathing treatment.  Doubt pneumonia and do not think we need antibiotics        Final Clinical Impression(s) / ED Diagnoses Final diagnoses:  Nausea and vomiting, unspecified vomiting type  Wheezing    Rx / DC Orders ED Discharge Orders          Ordered    prochlorperazine (COMPAZINE) 10 MG tablet  Every 8 hours PRN        11/22/21 2249    ondansetron (ZOFRAN ODT) 4 MG disintegrating tablet  Every 8 hours PRN        11/22/21 2249              Davonna Belling, MD 11/22/21 2256    Davonna Belling, MD 11/22/21 2257

## 2021-11-27 DIAGNOSIS — Z20822 Contact with and (suspected) exposure to covid-19: Secondary | ICD-10-CM | POA: Diagnosis not present

## 2021-11-28 ENCOUNTER — Ambulatory Visit: Payer: Self-pay | Admitting: *Deleted

## 2021-11-28 NOTE — Telephone Encounter (Signed)
Reason for Disposition  [1] Numbness (i.e., loss of sensation) of the face, arm / hand, or leg / foot on one side of the body AND [2] gradual onset (e.g., days to weeks) AND [3] present now  Answer Assessment - Initial Assessment Questions 1. SYMPTOM: "What is the main symptom you are concerned about?" (e.g., weakness, numbness)     Tingling, numbness in fingertips- more R fingers 2. ONSET: "When did this start?" (minutes, hours, days; while sleeping)     Last Thursday 3. LAST NORMAL: "When was the last time you (the patient) were normal (no symptoms)?"     Before last Th- normal 4. PATTERN "Does this come and go, or has it been constant since it started?"  "Is it present now?"     Constant- present now 5. CARDIAC SYMPTOMS: "Have you had any of the following symptoms: chest pain, difficulty breathing, palpitations?"     no 6. NEUROLOGIC SYMPTOMS: "Have you had any of the following symptoms: headache, dizziness, vision loss, double vision, changes in speech, unsteady on your feet?"     no 7. OTHER SYMPTOMS: "Do you have any other symptoms?"     no 8. PREGNANCY: "Is there any chance you are pregnant?" "When was your last menstrual period?"     *No Answer*  Protocols used: Neurologic Deficit-A-AH

## 2021-11-28 NOTE — Telephone Encounter (Signed)
°  Chief Complaint: R fingertips tingling Symptoms: tingling in fingertips- R Frequency: started Thursday Pertinent Negatives: Patient denies cardiac or other neurological symptoms Disposition: [] ED /[x] Urgent Care (no appt availability in office) / [] Appointment(In office/virtual)/ []  [facility]/ [] Home Care/ [] Refused Recommended Disposition /[] [facility]/ []  Follow-up with PCP Additional Notes: Advised UC for evaluation- follow up with PCP

## 2021-12-04 ENCOUNTER — Other Ambulatory Visit: Payer: Self-pay | Admitting: Family Medicine

## 2021-12-04 DIAGNOSIS — I10 Essential (primary) hypertension: Secondary | ICD-10-CM

## 2021-12-14 DIAGNOSIS — Z20822 Contact with and (suspected) exposure to covid-19: Secondary | ICD-10-CM | POA: Diagnosis not present

## 2021-12-20 ENCOUNTER — Other Ambulatory Visit: Payer: Self-pay | Admitting: Family Medicine

## 2021-12-20 DIAGNOSIS — I10 Essential (primary) hypertension: Secondary | ICD-10-CM

## 2021-12-22 DIAGNOSIS — R059 Cough, unspecified: Secondary | ICD-10-CM | POA: Diagnosis not present

## 2021-12-22 DIAGNOSIS — R051 Acute cough: Secondary | ICD-10-CM | POA: Diagnosis not present

## 2021-12-22 DIAGNOSIS — Z20822 Contact with and (suspected) exposure to covid-19: Secondary | ICD-10-CM | POA: Diagnosis not present

## 2021-12-29 ENCOUNTER — Other Ambulatory Visit: Payer: Self-pay

## 2021-12-29 ENCOUNTER — Encounter (HOSPITAL_BASED_OUTPATIENT_CLINIC_OR_DEPARTMENT_OTHER): Payer: Self-pay | Admitting: *Deleted

## 2021-12-29 ENCOUNTER — Emergency Department (HOSPITAL_BASED_OUTPATIENT_CLINIC_OR_DEPARTMENT_OTHER): Payer: Medicare Other

## 2021-12-29 ENCOUNTER — Other Ambulatory Visit (HOSPITAL_BASED_OUTPATIENT_CLINIC_OR_DEPARTMENT_OTHER): Payer: Self-pay

## 2021-12-29 ENCOUNTER — Emergency Department (HOSPITAL_BASED_OUTPATIENT_CLINIC_OR_DEPARTMENT_OTHER)
Admission: EM | Admit: 2021-12-29 | Discharge: 2021-12-29 | Disposition: A | Discharge status: Home / Self Care | Payer: Medicare Other | Attending: Emergency Medicine | Admitting: Emergency Medicine

## 2021-12-29 DIAGNOSIS — Z8507 Personal history of malignant neoplasm of pancreas: Secondary | ICD-10-CM | POA: Diagnosis not present

## 2021-12-29 DIAGNOSIS — I10 Essential (primary) hypertension: Secondary | ICD-10-CM | POA: Insufficient documentation

## 2021-12-29 DIAGNOSIS — Z20822 Contact with and (suspected) exposure to covid-19: Secondary | ICD-10-CM | POA: Diagnosis not present

## 2021-12-29 DIAGNOSIS — J40 Bronchitis, not specified as acute or chronic: Secondary | ICD-10-CM | POA: Diagnosis not present

## 2021-12-29 DIAGNOSIS — J45909 Unspecified asthma, uncomplicated: Secondary | ICD-10-CM | POA: Diagnosis not present

## 2021-12-29 DIAGNOSIS — R0602 Shortness of breath: Secondary | ICD-10-CM | POA: Diagnosis not present

## 2021-12-29 DIAGNOSIS — R059 Cough, unspecified: Secondary | ICD-10-CM | POA: Diagnosis not present

## 2021-12-29 LAB — COMPREHENSIVE METABOLIC PANEL
ALT: 20 U/L (ref 0–44)
AST: 25 U/L (ref 15–41)
Albumin: 3.7 g/dL (ref 3.5–5.0)
Alkaline Phosphatase: 89 U/L (ref 38–126)
Anion gap: 10 (ref 5–15)
BUN: 12 mg/dL (ref 8–23)
CO2: 26 mmol/L (ref 22–32)
Calcium: 8.8 mg/dL — ABNORMAL LOW (ref 8.9–10.3)
Chloride: 101 mmol/L (ref 98–111)
Creatinine, Ser: 0.5 mg/dL (ref 0.44–1.00)
GFR, Estimated: >60 mL/min (ref >60)
Glucose, Bld: 108 mg/dL — ABNORMAL HIGH (ref 70–99)
Potassium: 3.1 mmol/L — ABNORMAL LOW (ref 3.5–5.1)
Sodium: 137 mmol/L (ref 135–145)
Total Bilirubin: 0.8 mg/dL (ref 0.3–1.2)
Total Protein: 7.3 g/dL (ref 6.5–8.1)

## 2021-12-29 LAB — CBC WITH DIFFERENTIAL/PLATELET
Abs Immature Granulocytes: 0.03 10*3/uL (ref 0.00–0.07)
Basophils Absolute: 0.1 10*3/uL (ref 0.0–0.1)
Basophils Relative: 1 %
Eosinophils Absolute: 0.6 10*3/uL — ABNORMAL HIGH (ref 0.0–0.5)
Eosinophils Relative: 5 %
HCT: 38.1 % (ref 36.0–46.0)
Hemoglobin: 13.1 g/dL (ref 12.0–15.0)
Immature Granulocytes: 0 %
Lymphocytes Relative: 16 %
Lymphs Abs: 1.8 10*3/uL (ref 0.7–4.0)
MCH: 32.7 pg (ref 26.0–34.0)
MCHC: 34.4 g/dL (ref 30.0–36.0)
MCV: 95 fL (ref 80.0–100.0)
Monocytes Absolute: 0.9 10*3/uL (ref 0.1–1.0)
Monocytes Relative: 8 %
Neutro Abs: 8 10*3/uL — ABNORMAL HIGH (ref 1.7–7.7)
Neutrophils Relative %: 70 %
Platelets: 306 10*3/uL (ref 150–400)
RBC: 4.01 MIL/uL (ref 3.87–5.11)
RDW: 12.5 % (ref 11.5–15.5)
WBC: 11.4 10*3/uL — ABNORMAL HIGH (ref 4.0–10.5)
nRBC: 0 % (ref 0.0–0.2)

## 2021-12-29 LAB — TROPONIN I (HIGH SENSITIVITY): Troponin I (High Sensitivity): 8 ng/L (ref <18)

## 2021-12-29 LAB — RESP PANEL BY RT-PCR (FLU A&B, COVID) ARPGX2
Influenza A by PCR: NEGATIVE
Influenza B by PCR: NEGATIVE
SARS Coronavirus 2 by RT PCR: NEGATIVE

## 2021-12-29 MED ORDER — IPRATROPIUM-ALBUTEROL 0.5-2.5 (3) MG/3ML IN SOLN
3.0000 mL | Freq: Once | RESPIRATORY_TRACT | Status: AC
  Administered 2021-12-29: 3 mL via RESPIRATORY_TRACT
  Filled 2021-12-29: qty 3

## 2021-12-29 MED ORDER — PREDNISONE 20 MG PO TABS
20.0000 mg | ORAL_TABLET | Freq: Every day | ORAL | 0 refills | Status: AC
  Filled 2021-12-29: qty 4, 4d supply, fill #0

## 2021-12-29 MED ORDER — PREDNISONE 20 MG PO TABS
20.0000 mg | ORAL_TABLET | Freq: Once | ORAL | Status: AC
  Administered 2021-12-29: 20 mg via ORAL
  Filled 2021-12-29: qty 1

## 2021-12-29 MED ORDER — DOXYCYCLINE HYCLATE 100 MG PO CAPS
100.0000 mg | ORAL_CAPSULE | Freq: Two times a day (BID) | ORAL | 0 refills | Status: AC
  Filled 2021-12-29: qty 14, 7d supply, fill #0

## 2021-12-29 MED ORDER — DOXYCYCLINE HYCLATE 100 MG PO TABS
100.0000 mg | ORAL_TABLET | Freq: Once | ORAL | Status: AC
  Administered 2021-12-29: 100 mg via ORAL
  Filled 2021-12-29: qty 1

## 2021-12-29 NOTE — ED Triage Notes (Signed)
Cough, sob and congestion since yesterday. She feels like she has bronchitis.  ?

## 2021-12-29 NOTE — Discharge Instructions (Signed)
You were seen in the emergency room today with cough and congestion.  Your lab work here is reassuring and I am starting you on antibiotics, steroid, and will have you continue your albuterol.  Please follow with your primary care doctor.  I listed the name of a primary care doctor who practices in this building if this is more convenient for you.  Please return with any new or suddenly worsening symptoms. ?

## 2021-12-29 NOTE — ED Provider Notes (Signed)
? ?Emergency Department Provider Note ? ? ?I have reviewed the triage vital signs and the nursing notes. ? ? ?HISTORY ? ?Chief Complaint ?No chief complaint on file. ? ? ?HPI ?[name] is a 76 y.o. female with past medical history of asthma presents to the emergency department for evaluation of cough, congestion, chest tightness.  Patient states that she "gets this every year" describing her current symptoms.  Symptoms began again yesterday.  She has an MDI and has been using that frequently with minimal relief.  No fevers.  No pleuritic pain.  She does feel shortness of breath as well.  She notes in the past she is improved with antibiotic and steroid.  ? ? ?Past Medical History:  ?Diagnosis Date  ? Arthritis   ? Asthma   ? Cancer [facility])   ? Pancreas  ? Complication of anesthesia   ? GERD (gastroesophageal reflux disease)   ? at times  ? Hyperlipidemia   ? Hypertension   ? Peripheral vascular disease ([place])   ? has spider veins  ? Pneumonia   ? Pneumothorax, traumatic   ? RIGHT; associated with multiple rib fractures after a fall  ? PONV (postoperative nausea and vomiting)   ? Pre-diabetes   ? ? ?Review of Systems ? ?Constitutional: No fever/chills ?Eyes: No visual changes. ?ENT: No sore throat. ?Cardiovascular: Positive chest pain. ?Respiratory: Positive shortness of breath and cough.  ?Gastrointestinal: No abdominal pain.  No nausea, no vomiting.  No diarrhea.  No constipation. ?Genitourinary: Negative for dysuria. ?Musculoskeletal: Negative for back pain. ?Skin: Negative for rash. ?Neurological: Negative for headaches, focal weakness or numbness. ? ? ?____________________________________________ ? ? ?PHYSICAL EXAM: ? ?VITAL SIGNS: ?ED Triage Vitals  ?Enc Vitals Group  ?   BP 12/29/21 1331 (!) 161/81  ?   Pulse Rate 12/29/21 1331 89  ?   Resp 12/29/21 1331 20  ?   Temp 12/29/21 1331 98.6 ?F (37 ?C)  ?   Temp Source 12/29/21 1331 Oral  ?   SpO2 12/29/21 1331 97 %  ?   Weight 12/29/21 1325 139 lb 15.9 oz  (63.5 kg)  ?   Height 12/29/21 1325 '5\' 5"'$  (1.651 m)  ? ?Constitutional: Alert and oriented. Well appearing and in no acute distress. ?Eyes: Conjunctivae are normal.  ?Head: Atraumatic. ?Nose: No congestion/rhinnorhea. ?Mouth/Throat: Mucous membranes are moist. ?Neck: No stridor.   ?Cardiovascular: Normal rate, regular rhythm. Good peripheral circulation. Grossly normal heart sounds.   ?Respiratory: Normal respiratory effort.  No retractions. Lungs CTAB. ?Gastrointestinal: Soft and nontender. No distention.  ?Musculoskeletal: No lower extremity tenderness nor edema. No gross deformities of extremities. ?Neurologic:  Normal speech and language.  ?Skin:  Skin is warm, dry and intact. No rash noted. ? ? ?____________________________________________ ?  ?LABS ?(all labs ordered are listed, but only abnormal results are displayed) ? ?Labs Reviewed  ?COMPREHENSIVE METABOLIC PANEL - Abnormal; Notable for the following components:  ?    Result Value  ? Potassium 3.1 (*)   ? Glucose, Bld 108 (*)   ? Calcium 8.8 (*)   ? All other components within normal limits  ?CBC WITH DIFFERENTIAL/PLATELET - Abnormal; Notable for the following components:  ? WBC 11.4 (*)   ? Neutro Abs 8.0 (*)   ? Eosinophils Absolute 0.6 (*)   ? All other components within normal limits  ?RESP PANEL BY RT-PCR (FLU A&B, COVID) ARPGX2  ?TROPONIN I (HIGH SENSITIVITY)  ? ?____________________________________________ ? ?EKG ? ? EKG Interpretation ? ?Date/Time:  Friday  December 29 2021 14:05:48 EDT ?Ventricular Rate:  87 ?PR Interval:  132 ?QRS Duration: 80 ?QT Interval:  346 ?QTC Calculation: 417 ?R Axis:   63 ?Text Interpretation: Sinus rhythm Confirmed by Nanda Quinton [phone]) on 12/29/2021 2:11:58 PM ?  ? ?  ? ?____________________________________________ ? ? ?PROCEDURES ? ?Procedure(s) performed:  ? ?Procedures ? ?None ?____________________________________________ ? ? ?INITIAL IMPRESSION / ASSESSMENT AND PLAN / ED COURSE ? ?Pertinent labs & imaging results that  were available during my care of the patient were reviewed by me and considered in my medical decision making (see chart for details). ?  ?This patient is Presenting for Evaluation of CP, which does require a range of treatment options, and is a complaint that involves a high risk of morbidity and mortality. ? ?The Differential Diagnoses includes all life-threatening causes for chest pain. This includes but is not exclusive to acute coronary syndrome, aortic dissection, pulmonary embolism, cardiac tamponade, community-acquired pneumonia, pericarditis, musculoskeletal chest wall pain, etc. ?. ? ?Critical Interventions-  ?  ?Medications  ?ipratropium-albuterol (DUONEB) 0.5-2.5 (3) MG/3ML nebulizer solution 3 mL (3 mLs Nebulization Given 12/29/21 1410)  ?predniSONE (DELTASONE) tablet 20 mg (20 mg Oral Given 12/29/21 1529)  ?doxycycline (VIBRA-TABS) tablet 100 mg (100 mg Oral Given 12/29/21 1529)  ? ? ?Reassessment after intervention:  symptoms improved.  ? ? ? ?I decided to review pertinent External Data, and in summary patient with prior ED visit for similar in Dec 2022. ?  ?Clinical Laboratory Tests Ordered, included COVID and Flu negative. ? ?Radiologic Tests Ordered, included CXR. I independently interpreted the images and agree with radiology interpretation.  ? ?Cardiac Monitor Tracing which shows NSR. ? ? ?Social Determinants of Health Risk denies smoking or second hand smoke exposure.  ? ? ?Medical Decision Making: Summary:  ?Patient presents to the emergency department for evaluation of cough, congestion, shortness of breath.  I appreciate some wheezing on exam.  Notes that this feels similar to her bronchitis.  Plan for DuoNeb along with chest x-ray.  Patient describing some chest tightness along with her shortness of breath, likely asthma related but given age will obtain screening labs including troponin. ? ?Reevaluation with update and discussion with patient. Symptoms improved afer neb in the ED. Plan for  steroid and bronchitis treatment. Discussed strict ED return precautions.  ? ?Disposition: discharge ? ?____________________________________________ ? ?FINAL CLINICAL IMPRESSION(S) / ED DIAGNOSES ? ?Final diagnoses:  ?SOB (shortness of breath)  ?Bronchitis  ? ? ? ?NEW OUTPATIENT MEDICATIONS STARTED DURING THIS VISIT: ? ?Discharge Medication List as of 12/29/2021  3:14 PM  ?  ? ?START taking these medications  ? Details  ?doxycycline (VIBRAMYCIN) 100 MG capsule Take 1 capsule (100 mg total) by mouth 2 (two) times daily for 7 days., Starting Fri 12/29/2021, Until Fri 01/05/2022, Normal  ?  ?predniSONE (DELTASONE) 20 MG tablet Take 1 tablet (20 mg total) by mouth daily for 4 days., Starting Sat 12/30/2021, Until Wed 01/03/2022, Normal  ?  ?  ? ? ?Note:  This document was prepared using Dragon voice recognition software and may include unintentional dictation errors. ? ?Nanda Quinton, MD, FACEP ?Emergency Medicine ? ?  ?Margette Fast, MD ?01/01/22 1115 ? ?

## 2021-12-29 NOTE — ED Notes (Signed)
Patient transported to X-ray 

## 2022-01-02 DIAGNOSIS — Z20822 Contact with and (suspected) exposure to covid-19: Secondary | ICD-10-CM | POA: Diagnosis not present

## 2022-01-08 DIAGNOSIS — Z20822 Contact with and (suspected) exposure to covid-19: Secondary | ICD-10-CM | POA: Diagnosis not present

## 2022-01-12 DIAGNOSIS — Z20822 Contact with and (suspected) exposure to covid-19: Secondary | ICD-10-CM | POA: Diagnosis not present

## 2022-01-29 DIAGNOSIS — Z20822 Contact with and (suspected) exposure to covid-19: Secondary | ICD-10-CM | POA: Diagnosis not present

## 2022-02-01 DIAGNOSIS — Z20822 Contact with and (suspected) exposure to covid-19: Secondary | ICD-10-CM | POA: Diagnosis not present

## 2022-02-08 DIAGNOSIS — Z20822 Contact with and (suspected) exposure to covid-19: Secondary | ICD-10-CM | POA: Diagnosis not present

## 2022-02-13 DIAGNOSIS — Z20822 Contact with and (suspected) exposure to covid-19: Secondary | ICD-10-CM | POA: Diagnosis not present

## 2022-02-19 DIAGNOSIS — Z23 Encounter for immunization: Secondary | ICD-10-CM | POA: Diagnosis not present

## 2022-02-19 DIAGNOSIS — Z20822 Contact with and (suspected) exposure to covid-19: Secondary | ICD-10-CM | POA: Diagnosis not present

## 2022-02-20 DIAGNOSIS — Z20822 Contact with and (suspected) exposure to covid-19: Secondary | ICD-10-CM | POA: Diagnosis not present

## 2022-04-19 ENCOUNTER — Telehealth: Payer: Self-pay | Admitting: Family Medicine

## 2022-04-19 ENCOUNTER — Encounter: Payer: Self-pay | Admitting: Family Medicine

## 2022-04-19 NOTE — Telephone Encounter (Signed)
Fax received with [facility] [place] for [facility].  Charge sheet attached.

## 2022-04-20 DIAGNOSIS — Z111 Encounter for screening for respiratory tuberculosis: Secondary | ICD-10-CM | POA: Diagnosis not present

## 2022-04-20 NOTE — Telephone Encounter (Signed)
Unfortunately I have not evaluated this patient since September 2022.  In order to fill out FL2 it may be best to have updated visit, even video visit if that is easier so we can update her medical history and complete that form effectively.  Please schedule that visit and I can complete paperwork as soon as possible.

## 2022-04-20 NOTE — Telephone Encounter (Signed)
I have this filled out, printed medication list and snap shot  Will put this in sign folder on Monday

## 2022-04-20 NOTE — Telephone Encounter (Signed)
Bambi from [place] in [place] called about FL2 form. I confirmed we had it and that it would be signed by Carlota Raspberry upon his return. She wanted to me to be sure that he saw that patient wanted to self administer medication on page 3.

## 2022-04-23 ENCOUNTER — Encounter (HOSPITAL_COMMUNITY): Payer: Self-pay | Admitting: Emergency Medicine

## 2022-04-23 ENCOUNTER — Emergency Department (HOSPITAL_COMMUNITY)
Admission: EM | Admit: 2022-04-23 | Discharge: 2022-04-24 | Disposition: A | Discharge status: Other Acute Inpatient Hospital | Payer: Medicare Other | Attending: Emergency Medicine | Admitting: Emergency Medicine

## 2022-04-23 ENCOUNTER — Encounter: Payer: Self-pay | Admitting: Family Medicine

## 2022-04-23 ENCOUNTER — Telehealth: Payer: Self-pay | Admitting: Family Medicine

## 2022-04-23 ENCOUNTER — Emergency Department (HOSPITAL_COMMUNITY): Payer: Medicare Other

## 2022-04-23 ENCOUNTER — Other Ambulatory Visit: Payer: Self-pay

## 2022-04-23 DIAGNOSIS — Z046 Encounter for general psychiatric examination, requested by authority: Secondary | ICD-10-CM | POA: Diagnosis present

## 2022-04-23 DIAGNOSIS — M1811 Unilateral primary osteoarthritis of first carpometacarpal joint, right hand: Secondary | ICD-10-CM | POA: Diagnosis not present

## 2022-04-23 DIAGNOSIS — Z20822 Contact with and (suspected) exposure to covid-19: Secondary | ICD-10-CM | POA: Insufficient documentation

## 2022-04-23 DIAGNOSIS — R45851 Suicidal ideations: Secondary | ICD-10-CM | POA: Diagnosis not present

## 2022-04-23 DIAGNOSIS — M79641 Pain in right hand: Secondary | ICD-10-CM | POA: Diagnosis not present

## 2022-04-23 DIAGNOSIS — M79643 Pain in unspecified hand: Secondary | ICD-10-CM | POA: Diagnosis not present

## 2022-04-23 DIAGNOSIS — Z111 Encounter for screening for respiratory tuberculosis: Secondary | ICD-10-CM | POA: Diagnosis not present

## 2022-04-23 DIAGNOSIS — F329 Major depressive disorder, single episode, unspecified: Secondary | ICD-10-CM | POA: Insufficient documentation

## 2022-04-23 LAB — COMPREHENSIVE METABOLIC PANEL
ALT: 17 U/L (ref 0–44)
AST: 17 U/L (ref 15–41)
Albumin: 4.4 g/dL (ref 3.5–5.0)
Alkaline Phosphatase: 95 U/L (ref 38–126)
Anion gap: 10 (ref 5–15)
BUN: 12 mg/dL (ref 8–23)
CO2: 26 mmol/L (ref 22–32)
Calcium: 9.6 mg/dL (ref 8.9–10.3)
Chloride: 108 mmol/L (ref 98–111)
Creatinine, Ser: 0.47 mg/dL (ref 0.44–1.00)
GFR, Estimated: >60 mL/min (ref >60)
Glucose, Bld: 107 mg/dL — ABNORMAL HIGH (ref 70–99)
Potassium: 3.2 mmol/L — ABNORMAL LOW (ref 3.5–5.1)
Sodium: 144 mmol/L (ref 135–145)
Total Bilirubin: 0.6 mg/dL (ref 0.3–1.2)
Total Protein: 7.7 g/dL (ref 6.5–8.1)

## 2022-04-23 LAB — CBC WITH DIFFERENTIAL/PLATELET
Abs Immature Granulocytes: 0.01 10*3/uL (ref 0.00–0.07)
Basophils Absolute: 0.1 10*3/uL (ref 0.0–0.1)
Basophils Relative: 1 %
Eosinophils Absolute: 0.3 10*3/uL (ref 0.0–0.5)
Eosinophils Relative: 5 %
HCT: 42 % (ref 36.0–46.0)
Hemoglobin: 14.3 g/dL (ref 12.0–15.0)
Immature Granulocytes: 0 %
Lymphocytes Relative: 35 %
Lymphs Abs: 2.3 10*3/uL (ref 0.7–4.0)
MCH: 32.5 pg (ref 26.0–34.0)
MCHC: 34 g/dL (ref 30.0–36.0)
MCV: 95.5 fL (ref 80.0–100.0)
Monocytes Absolute: 0.5 10*3/uL (ref 0.1–1.0)
Monocytes Relative: 7 %
Neutro Abs: 3.3 10*3/uL (ref 1.7–7.7)
Neutrophils Relative %: 52 %
Platelets: 339 10*3/uL (ref 150–400)
RBC: 4.4 MIL/uL (ref 3.87–5.11)
RDW: 12.3 % (ref 11.5–15.5)
WBC: 6.4 10*3/uL (ref 4.0–10.5)
nRBC: 0 % (ref 0.0–0.2)

## 2022-04-23 LAB — RAPID URINE DRUG SCREEN, HOSP PERFORMED
Amphetamines: NOT DETECTED
Barbiturates: NOT DETECTED
Benzodiazepines: NOT DETECTED
Cocaine: NOT DETECTED
Opiates: NOT DETECTED
Tetrahydrocannabinol: NOT DETECTED

## 2022-04-23 LAB — RESP PANEL BY RT-PCR (FLU A&B, COVID) ARPGX2
Influenza A by PCR: NEGATIVE
Influenza B by PCR: NEGATIVE
SARS Coronavirus 2 by RT PCR: NEGATIVE

## 2022-04-23 LAB — ETHANOL: Alcohol, Ethyl (B): <10 mg/dL (ref <10)

## 2022-04-23 LAB — SALICYLATE LEVEL: Salicylate Lvl: <7 mg/dL — ABNORMAL LOW (ref 7.0–30.0)

## 2022-04-23 LAB — ACETAMINOPHEN LEVEL: Acetaminophen (Tylenol), Serum: <10 ug/mL — ABNORMAL LOW (ref 10–30)

## 2022-04-23 MED ORDER — ALBUTEROL SULFATE HFA 108 (90 BASE) MCG/ACT IN AERS: 1.0000 | INHALATION_SPRAY | Freq: Four times a day (QID) | RESPIRATORY_TRACT | Status: DC | PRN

## 2022-04-23 MED ORDER — ONDANSETRON 4 MG PO TBDP
4.0000 mg | ORAL_TABLET | Freq: Three times a day (TID) | ORAL | Status: DC | PRN
  Administered 2022-04-24: 4 mg via ORAL
  Filled 2022-04-23: qty 1

## 2022-04-23 MED ORDER — PANTOPRAZOLE SODIUM 40 MG PO TBEC
40.0000 mg | DELAYED_RELEASE_TABLET | Freq: Two times a day (BID) | ORAL | Status: DC
  Administered 2022-04-23 – 2022-04-24 (×2): 40 mg via ORAL
  Filled 2022-04-23 (×2): qty 1

## 2022-04-23 MED ORDER — LORAZEPAM 0.5 MG PO TABS
0.5000 mg | ORAL_TABLET | ORAL | Status: AC | PRN
  Administered 2022-04-23 (×2): 0.5 mg via ORAL
  Filled 2022-04-23 (×2): qty 1

## 2022-04-23 MED ORDER — POTASSIUM CHLORIDE CRYS ER 20 MEQ PO TBCR
40.0000 meq | EXTENDED_RELEASE_TABLET | Freq: Once | ORAL | Status: AC
  Administered 2022-04-23: 40 meq via ORAL
  Filled 2022-04-23: qty 2

## 2022-04-23 MED ORDER — ATORVASTATIN CALCIUM 10 MG PO TABS: 20.0000 mg | ORAL_TABLET | Freq: Every day | ORAL | Status: DC

## 2022-04-23 MED ORDER — FLUTICASONE FUROATE-VILANTEROL 200-25 MCG/ACT IN AEPB
1.0000 | INHALATION_SPRAY | Freq: Every day | RESPIRATORY_TRACT | Status: DC
  Filled 2022-04-23: qty 28

## 2022-04-23 MED ORDER — AMLODIPINE BESYLATE 5 MG PO TABS
5.0000 mg | ORAL_TABLET | Freq: Every day | ORAL | Status: DC
  Administered 2022-04-24: 5 mg via ORAL
  Filled 2022-04-23: qty 1

## 2022-04-23 NOTE — Telephone Encounter (Signed)
(  FYI) Bambi from [place] in [place] called wanting a update on FL2 form. Nanine Means is now aware that patient needs a video visit or OV to get forms signed. I also call pt on [phone]). No answer and the voice mail box is full.  Bambi states she will have pt or pt's son to call and schedule her appt with [place].

## 2022-04-23 NOTE — Telephone Encounter (Signed)
Could you call this person a video Visit or OV in order to have the FL2 form filled out.

## 2022-04-23 NOTE — ED Provider Notes (Signed)
[facility] DEPT Provider Note   CSN: [id number] Arrival date & time: 04/23/22  1742     History  Chief Complaint  Patient presents with   Hand Pain   Suicidal    [name] is a 76 y.o. female.   Hand Pain   Patient is a 76 year old female with a past medical history significant for HTN, reflux, arthritis, pneumonia, HLD, cancer, asthma  Patient presented emergency room today with complaints of suicidal thoughts she states that she is considered cutting her wrists.  She states she has had 1 suicide attempt in her life before where she tried to take pills to kill herself but was unsuccessful.  She has not taken any pills or medications in attempt to kill herself today.  She denies any pain apart from pain in her right hand which has been ongoing for several days.      Home Medications Prior to Admission medications   Medication Sig Start Date End Date Taking? Authorizing Provider  acetaminophen (TYLENOL) 325 MG tablet Take 650 mg by mouth every 6 (six) hours as needed for mild pain or moderate pain.     [provider]  albuterol (VENTOLIN HFA) 108 (90 Base) MCG/ACT inhaler Inhale 1-2 puffs into the lungs every 6 (six) hours as needed for wheezing or shortness of breath. 10/16/21   Prosperi, Christian H, PA-C  alum & mag hydroxide-simeth (MAALOX/MYLANTA) 200-200-20 MG/5ML suspension Take 15 mLs by mouth every 4 (four) hours as needed for indigestion or heartburn. 04/15/21   Aline August, MD  amLODipine (NORVASC) 5 MG tablet TAKE 1 TABLET(5 MG) BY MOUTH DAILY 12/20/21   [name], MD  atorvastatin (LIPITOR) 20 MG tablet Take 1 tablet (20 mg total) by mouth daily at 6 PM. 05/29/21   [name], MD  clotrimazole (LOTRIMIN) 1 % cream Apply 1 application topically 2 (two) times daily. 05/29/21   [name], MD  Dextromethorphan-guaiFENesin [facility] DM MAXIMUM STRENGTH) 60-1200 MG TB12 Take 1 tablet by mouth daily as needed  (chest congestion). Patient not taking: Reported on 06/22/2021    [provider]  fenofibrate (TRICOR) 145 MG tablet Take 1 tablet (145 mg total) by mouth daily. Patient not taking: Reported on 11/22/2021 08/12/20   [name], MD  fluticasone-salmeterol (ADVAIR [facility]) [phone] MCG/ACT inhaler Inhale 2 puffs into the lungs 2 (two) times daily. 05/29/21   [name], MD  ibuprofen (ADVIL,MOTRIN) 100 MG tablet Take 200 mg by mouth every 6 (six) hours as needed for pain.    [provider]  olmesartan (BENICAR) 20 MG tablet TAKE 1 TABLET(20 MG) BY MOUTH DAILY 12/04/21   [name], MD  ondansetron (ZOFRAN ODT) 4 MG disintegrating tablet Take 1 tablet (4 mg total) by mouth every 8 (eight) hours as needed for nausea or vomiting. 11/22/21   Davonna Belling, MD  oxymetazoline (AFRIN) 0.05 % nasal spray Place 1 spray into both nostrils daily as needed for congestion.    [provider]  pantoprazole (PROTONIX) 40 MG tablet Take 1 tablet (40 mg total) by mouth 2 (two) times daily. Patient not taking: Reported on 06/22/2021 04/15/21   Aline August, MD  potassium chloride SA (KLOR-CON M) 20 MEQ tablet Take 1 tablet (20 mEq total) by mouth 2 (two) times daily. 10/23/21   Blanchie Dessert, MD  prochlorperazine (COMPAZINE) 10 MG tablet Take 1 tablet (10 mg total) by mouth every 8 (eight) hours as needed for nausea or vomiting.  11/22/21   Davonna Belling, MD  Respiratory Therapy Supplies (FLUTTER) DEVI 1 each by Does not apply route 2 (two) times daily. 07/15/19   Julian Hy, DO      Allergies    Dilaudid [hydromorphone hcl] and Morphine and related    Review of Systems   Review of Systems  Physical Exam Updated Vital Signs BP (!) 178/104   Pulse 72   Temp 98 F (36.7 C) (Oral)   Resp 18   SpO2 100%  Physical Exam Vitals and nursing note reviewed.  Constitutional:      General: She is not in acute distress. HENT:     Head: Normocephalic and atraumatic.      Nose: Nose normal.  Eyes:     General: No scleral icterus. Cardiovascular:     Rate and Rhythm: Normal rate and regular rhythm.     Pulses: Normal pulses.     Heart sounds: Normal heart sounds.  Pulmonary:     Effort: Pulmonary effort is normal. No respiratory distress.     Breath sounds: No wheezing.  Abdominal:     Palpations: Abdomen is soft.     Tenderness: There is no abdominal tenderness.  Musculoskeletal:     Cervical back: Normal range of motion.     Right lower leg: No edema.     Left lower leg: No edema.  Skin:    General: Skin is warm and dry.     Capillary Refill: Capillary refill takes less than 2 seconds.  Neurological:     Mental Status: She is alert. Mental status is at baseline.  Psychiatric:        Mood and Affect: Mood normal.        Behavior: Behavior normal.     ED Results / Procedures / Treatments   Labs (all labs ordered are listed, but only abnormal results are displayed) Labs Reviewed  COMPREHENSIVE METABOLIC PANEL - Abnormal; Notable for the following components:      Result Value   Potassium 3.2 (*)    Glucose, Bld 107 (*)    All other components within normal limits  ACETAMINOPHEN LEVEL - Abnormal; Notable for the following components:   Acetaminophen (Tylenol), Serum <10 (*)    All other components within normal limits  SALICYLATE LEVEL - Abnormal; Notable for the following components:   Salicylate Lvl <9.2 (*)    All other components within normal limits  RESP PANEL BY RT-PCR (FLU A&B, COVID) ARPGX2  ETHANOL  RAPID URINE DRUG SCREEN, HOSP PERFORMED  CBC WITH DIFFERENTIAL/PLATELET    EKG None  Radiology DG Hand Complete Right  Result Date: 04/23/2022 CLINICAL DATA:  Hand pain EXAM: RIGHT HAND - COMPLETE 3+ VIEW COMPARISON:  None Available. FINDINGS: Joint space narrowing within the MCP joints. Mild degenerative changes at the 1st carpometacarpal joint. No acute bony abnormality. Specifically, no fracture, subluxation, or dislocation.  Soft tissues are intact. IMPRESSION: No acute bony abnormality. Electronically Signed   By: Rolm Baptise M.D.   On: 04/23/2022 19:10    Procedures Procedures    Medications Ordered in ED Medications  LORazepam (ATIVAN) tablet 0.5 mg (0.5 mg Oral Given 04/23/22 1906)  potassium chloride SA (KLOR-CON M) CR tablet 40 mEq (has no administration in time range)  albuterol (VENTOLIN HFA) 108 (90 Base) MCG/ACT inhaler 1-2 puff (has no administration in time range)  amLODipine (NORVASC) tablet 5 mg (has no administration in time range)  atorvastatin (LIPITOR) tablet 20 mg (has no administration in time  range)  fluticasone furoate-vilanterol (BREO ELLIPTA) 200-25 MCG/ACT 1 puff (has no administration in time range)  ondansetron (ZOFRAN-ODT) disintegrating tablet 4 mg (has no administration in time range)  pantoprazole (PROTONIX) EC tablet 40 mg (has no administration in time range)    ED Course/ Medical Decision Making/ A&P                           Medical Decision Making Amount and/or Complexity of Data Reviewed Labs: ordered. Radiology: ordered.  Risk Prescription drug management.   Suicidal thoughts  Medically cleared   Hand xray unremarkable.     IVC ppw filled and given to night team.    I personally reviewed all laboratory work and imaging. K slightly low. Will replete here. Otherwise metabolic panel without any acute abnormality specifically kidney function within normal limits and no significant electrolyte abnormalities. CBC without leukocytosis or significant anemia.    Medically cleared TTS consult Home medications ordered, home meds ordered  Final Clinical Impression(s) / ED Diagnoses Final diagnoses:  Suicidal ideation  Right hand pain    Rx / DC Orders ED Discharge Orders     None         Tedd Sias, [place] 04/23/22 2208    Regan Lemming, MD 04/23/22 2348

## 2022-04-23 NOTE — Telephone Encounter (Signed)
FYI! PT son called stating that his mom is living  in unsafe environment at the moment and they are trying to get her in with [facility] ASAP. Son wants to know if we any space for his mom this week please contact him [phone]. Pt prefer mychart visit.

## 2022-04-23 NOTE — ED Notes (Signed)
Pt given crackers and orange juice

## 2022-04-23 NOTE — Telephone Encounter (Signed)
Patient wants to know if she could be seen anytime this week patient there are not slot except same day.

## 2022-04-23 NOTE — ED Notes (Signed)
Patient dressed in burgundy scrubs and yellow anti-slip socks per policy. Belongings placed in belongings bag including clothing and purse. Bag placed in triage nurse's station patient belongings cabinet.

## 2022-04-23 NOTE — ED Triage Notes (Signed)
Pt reports feeling sad and suicidal lately. Pt also reports right hand pain.

## 2022-04-24 DIAGNOSIS — R7303 Prediabetes: Secondary | ICD-10-CM | POA: Diagnosis not present

## 2022-04-24 DIAGNOSIS — K219 Gastro-esophageal reflux disease without esophagitis: Secondary | ICD-10-CM | POA: Diagnosis not present

## 2022-04-24 DIAGNOSIS — Z20822 Contact with and (suspected) exposure to covid-19: Secondary | ICD-10-CM | POA: Diagnosis not present

## 2022-04-24 DIAGNOSIS — M792 Neuralgia and neuritis, unspecified: Secondary | ICD-10-CM | POA: Diagnosis not present

## 2022-04-24 DIAGNOSIS — F325 Major depressive disorder, single episode, in full remission: Secondary | ICD-10-CM | POA: Diagnosis not present

## 2022-04-24 DIAGNOSIS — I10 Essential (primary) hypertension: Secondary | ICD-10-CM | POA: Diagnosis not present

## 2022-04-24 DIAGNOSIS — M79641 Pain in right hand: Secondary | ICD-10-CM | POA: Diagnosis not present

## 2022-04-24 DIAGNOSIS — F332 Major depressive disorder, recurrent severe without psychotic features: Secondary | ICD-10-CM | POA: Diagnosis not present

## 2022-04-24 DIAGNOSIS — J45991 Cough variant asthma: Secondary | ICD-10-CM | POA: Diagnosis not present

## 2022-04-24 DIAGNOSIS — D3A8 Other benign neuroendocrine tumors: Secondary | ICD-10-CM | POA: Diagnosis not present

## 2022-04-24 DIAGNOSIS — E785 Hyperlipidemia, unspecified: Secondary | ICD-10-CM | POA: Diagnosis not present

## 2022-04-24 DIAGNOSIS — R45851 Suicidal ideations: Secondary | ICD-10-CM | POA: Diagnosis not present

## 2022-04-24 DIAGNOSIS — F329 Major depressive disorder, single episode, unspecified: Secondary | ICD-10-CM | POA: Diagnosis not present

## 2022-04-24 DIAGNOSIS — E876 Hypokalemia: Secondary | ICD-10-CM | POA: Diagnosis not present

## 2022-04-24 MED ORDER — ACETAMINOPHEN 325 MG PO TABS
650.0000 mg | ORAL_TABLET | Freq: Four times a day (QID) | ORAL | Status: DC | PRN
  Administered 2022-04-24: 650 mg via ORAL
  Filled 2022-04-24: qty 2

## 2022-04-24 MED ORDER — DOCUSATE SODIUM 100 MG PO CAPS
100.0000 mg | ORAL_CAPSULE | Freq: Once | ORAL | Status: AC
  Administered 2022-04-24: 100 mg via ORAL
  Filled 2022-04-24: qty 1

## 2022-04-24 MED ORDER — LORAZEPAM 1 MG PO TABS
1.0000 mg | ORAL_TABLET | Freq: Once | ORAL | Status: AC
  Administered 2022-04-24: 1 mg via ORAL
  Filled 2022-04-24: qty 1

## 2022-04-24 MED ORDER — ONDANSETRON 4 MG PO TBDP
4.0000 mg | ORAL_TABLET | Freq: Once | ORAL | Status: DC
  Filled 2022-04-24: qty 1

## 2022-04-24 NOTE — ED Notes (Signed)
Patient to room 28. Patient alert and cooperative, no s/s of distress. Patient oriented to unit and room .

## 2022-04-24 NOTE — ED Notes (Signed)
1 bag of pt's belongings placed in locker 28 in [place] by ED RN as witnessed by [employer] in [place].

## 2022-04-24 NOTE — Discharge Instructions (Signed)
It was a pleasure caring for you today in the emergency department. ° °Please return to the emergency department for any worsening or worrisome symptoms. ° ° °

## 2022-04-24 NOTE — ED Notes (Signed)
Report given to [facility] in [place].  Pt transferred to TCU to await bed placement.

## 2022-04-24 NOTE — BH Assessment (Signed)
[facility] Assessment Progress Note   Per Charmaine Downs, NP, this pt requires psychiatric hospitalization at this time.  At 12:25 Kia calls from [facility].  Pt has been accepted to their facility by Dr Alcide Clever to their [facility] unit.  Reginold Agent, concurs with this disposition, as does the pt who is currently under voluntary status.  EDP Wynona Dove, DO and pt's nurse, Eustaquio Maize, have been notified, and Eustaquio Maize agrees to call report to [phone].  Pt is to be transported via [facility].  Jalene Mullet, [place] Coordinator [phone]

## 2022-04-24 NOTE — BH Assessment (Signed)
Comprehensive Clinical Assessment (CCA) Note  04/24/2022 [name] [id number]  Disposition: Anell Barr, NP, patient meets inpatient criteria. Geropsychiatry recommended. Caryl Comes, RN, informed of disposition.  The patient demonstrates the following risk factors for suicide: Chronic risk factors for suicide include: psychiatric disorder of depression . Acute risk factors for suicide include: family or marital conflict. Protective factors for this patient include: hope for the future. Considering these factors, the overall suicide risk at this point appears to be high. Patient is not appropriate for outpatient follow up.  [facility] ED from 04/23/2022 in [place] DEPT ED from 12/29/2021 in [facility] ED from 11/22/2021 in [place] No Risk No Risk      [name] is a 76 year old female presenting voluntary to [facility] due to [place] with plan to cut her wrist. Patient reported to EDP past suicide attempt of taking pills, unable to access timeframe. Patient continues to stated "I wish I could go back home with my mom and dad". When asked, where is home, patient stated "in heaven, I am ready to give it all up". Patient initially stated she did not have SI today, then she stated "I think about suicide everyday, 2 weeks ago I took a knife, then I laid down on the couch, but I couldn't do nothing, I couldn't hurt myself". Patient reported loosing her apartment due to her grandson and now living with her grandsons friends, which she reported she does not feel comfortable living there. Patient reports worsening depressive symptoms. Patient denied prior psych hospitalizations and self-harming behaviors. Patient reported poor sleep and poor appetite. No collateral information given.  Chief Complaint:  Chief Complaint  Patient presents with   Hand Pain   Suicidal   Visit  Diagnosis:  Major depressive disorder  CCA Screening, Triage and Referral (STR)  Patient Reported Information How did you hear about [place]? Self  What Is the Reason for Your Visit/Call Today? SI with plan to cut wrist.  How Long Has This Been Causing You Problems? 1-6 months  What Do You Feel Would Help You the Most Today? Treatment for Depression or other mood problem   Have You Recently Had Any Thoughts About Hurting Yourself? Yes  Are You Planning to Commit Suicide/Harm Yourself At This time? No   Have you Recently Had Thoughts About [place]? No  Are You Planning to Harm Someone at This Time? No  Explanation: No data recorded  Have You Used Any Alcohol or Drugs in the Past 24 Hours? No  How Long Ago Did You Use Drugs or Alcohol? No data recorded What Did You Use and How Much? No data recorded  Do You Currently Have a Therapist/Psychiatrist? No  Name of Therapist/Psychiatrist: No data recorded  Have You Been Recently Discharged From Any Office Practice or Programs? No  Explanation of Discharge From Practice/Program: No data recorded    CCA Screening Triage Referral Assessment Type of Contact: Tele-Assessment  Telemedicine Service Delivery:   Is this Initial or Reassessment? Initial Assessment  Date Telepsych consult ordered in [facility]:  04/23/22  Time Telepsych consult ordered in [facility]:  2204  Location of Assessment: [facility] ED  Provider Location: [facility] Assessment Services   Collateral Involvement: none reported   Does Patient Have a [place]? No data recorded Name and Contact of Legal Guardian: No data recorded If Minor and Not Living with Parent(s), Who  has Custody? No data recorded Is CPS involved or ever been involved? No data recorded Is APS involved or ever been involved? No data recorded  Patient Determined To Be At Risk for Harm To Self or Others Based on Review of Patient Reported Information or Presenting Complaint? No  data recorded Method: No data recorded Availability of Means: No data recorded Intent: No data recorded Notification Required: No data recorded Additional Information for Danger to Others Potential: No data recorded Additional Comments for Danger to Others Potential: No data recorded Are There Guns or Other Weapons in Your Home? No data recorded Types of Guns/Weapons: No data recorded Are These Weapons Safely Secured?                            No data recorded Who Could Verify You Are Able To Have These Secured: No data recorded Do You Have any Outstanding Charges, Pending Court Dates, Parole/Probation? No data recorded Contacted To Inform of Risk of Harm To Self or Others: No data recorded   Does Patient Present under Involuntary Commitment? No data recorded IVC Papers Initial File Date: No data recorded  [place] of Residence: [place]   Patient Currently Receiving the Following Services: Not Receiving Services   Determination of Need: Urgent (48 hours)   Options For Referral: Inpatient Hospitalization; Medication Management; Outpatient Therapy     CCA Biopsychosocial Patient Reported Schizophrenia/Schizoaffective Diagnosis in Past: No data recorded  Strengths: self-awareness   Mental Health Symptoms Depression:   Hopelessness; Fatigue; Change in energy/activity; Tearfulness; Increase/decrease in appetite; Worthlessness   Duration of Depressive symptoms:  Duration of Depressive Symptoms: Greater than two weeks   Mania:   None   Anxiety:    Worrying; Tension; Sleep; Restlessness; Fatigue   Psychosis:   None   Duration of Psychotic symptoms:    Trauma:   None   Obsessions:   None   Compulsions:   None   Inattention:   None   Hyperactivity/Impulsivity:   None   Oppositional/Defiant Behaviors:   None   Emotional Irregularity:   None   Other Mood/Personality Symptoms:  No data recorded   Mental Status Exam Appearance and self-care  Stature:    Average   Weight:   Average weight   Clothing:  No data recorded  Grooming:   Normal   Cosmetic use:   None   Posture/gait:   Normal   Motor activity:   Not Remarkable   Sensorium  Attention:   Normal   Concentration:   Normal   Orientation:   Time; Situation; Place; Person   Recall/memory:   Normal   Affect and Mood  Affect:   Appropriate; Depressed   Mood:   Hopeless; Depressed   Relating  Eye contact:   Normal   Facial expression:   Depressed; Sad   Attitude toward examiner:   Cooperative   Thought and Language  Speech flow:  Clear and Coherent   Thought content:   Appropriate to Mood and Circumstances   Preoccupation:   None   Hallucinations:   None   Organization:  No data recorded  [employer] of Knowledge:   Average   Intelligence:   Average   Abstraction:   Normal   Judgement:   Fair   Reality Testing:  No data recorded  Insight:   Lacking   Decision Making:   Impulsive   Social Functioning  Social Maturity:   Impulsive  Social Judgement:   Naive   Stress  Stressors:   Family conflict; Housing   Coping Ability:   Exhausted; Overwhelmed; Deficient supports   Skill Deficits:   Decision making   Supports:   Support needed     Religion: Religion/Spirituality Are You A Religious Person?:  Special educational needs teacher)  Leisure/Recreation: Leisure / Recreation Do You Have Hobbies?: No  Exercise/Diet: Exercise/Diet Do You Exercise?:  (uta) Do You Follow a Special Diet?:  (uta) Do You Have Any Trouble Sleeping?: No   CCA Employment/Education Employment/Work Situation: Employment / Work Nurse, children's Situation: Retired Social research officer, government has Been Impacted by Current Illness: No Has Patient ever Been in Passenger transport manager?: No  Education: Education Is Patient Currently Attending School?: No Last Grade Completed: 12 Did You Nutritional therapist?: No Did You Have An Individualized Education Program (IIEP):   [place]) Did You Have Any Difficulty At [employer]?:  [employer]) Patient's Education Has Been Impacted by Current Illness:  (uta)   CCA Family/Childhood History Family and Relationship History: Family history Marital status: Single Does patient have children?: Yes How many children?: 2 How is patient's relationship with their children?: good  Childhood History:  Childhood History By whom was/is the patient raised?:  (uta) Did patient suffer any verbal/emotional/physical/sexual abuse as a child?: No Did patient suffer from severe childhood neglect?: No Has patient ever been sexually abused/assaulted/raped as an adolescent or adult?: No  Child/Adolescent Assessment:     CCA Substance Use Alcohol/Drug Use: Alcohol / Drug Use Pain Medications: see MAR` Prescriptions: see MAR Over the Counter: see MAR History of alcohol / drug use?: No history of alcohol / drug abuse                         ASAM's:  Six Dimensions of Multidimensional Assessment  Dimension 1:  Acute Intoxication and/or Withdrawal Potential:      Dimension 2:  Biomedical Conditions and Complications:      Dimension 3:  Emotional, Behavioral, or Cognitive Conditions and Complications:     Dimension 4:  Readiness to Change:     Dimension 5:  Relapse, Continued use, or Continued Problem Potential:     Dimension 6:  Recovery/Living Environment:     ASAM Severity Score:    ASAM Recommended Level of Treatment:     Substance use Disorder (SUD)    Recommendations for Services/Supports/Treatments: Recommendations for Services/Supports/Treatments Recommendations For Services/Supports/Treatments: Individual Therapy, Inpatient Hospitalization, Medication Management  Discharge Disposition:    DSM5 Diagnoses: Patient Active Problem List   Diagnosis Date Noted   GERD (gastroesophageal reflux disease)    Hypokalemia    Intractable nausea and vomiting 01/27/2021   Prediabetes 02/14/2020   Essential hypertension  08/19/2017   Hyperlipidemia 08/19/2017   Cough variant asthma 04/04/2016   Upper airway cough syndrome 04/04/2016   Osteoarthritis of left shoulder 03/24/2012     Referrals to Alternative Service(s): Referred to Alternative Service(s):   Place:   Date:   Time:    Referred to Alternative Service(s):   Place:   Date:   Time:    Referred to Alternative Service(s):   Place:   Date:   Time:    Referred to Alternative Service(s):   Place:   Date:   Time:     Venora Maples, [facility]

## 2022-04-24 NOTE — ED Notes (Signed)
Pts belongings were placed in the 9-12 nurses station cabinet.

## 2022-04-24 NOTE — ED Notes (Signed)
Patient DC d off unit to facility. Patient alert,  cooperative, no s/s of distress. DC information and belongings given to [facility] for transport. Patient ambulatory.  Patient in w/c off unit. Patient transported by [facility].

## 2022-04-24 NOTE — BH Assessment (Addendum)
[facility] Assessment Progress Note  Per Erasmo Score, NP, this voluntary pt requires psychiatric hospitalization at this time, preferably at a facility providing specialty care for geriatric patients.  [facility] will not be able to accommodate this pt today.  At the direction of Hampton Abbot, MD this writer has sought placement for pt at facilities outside of the [facility] system. The following facilities have been contacted to seek placement for this pt, with results as noted:  Beds available, information sent, decision pending: [facility]  Unable to reach: [facility] (left message at 11:30)  At capacity: [facility]  If this voluntary pt is accepted to a facility, please discuss disposition with pt to be sure that she agrees to the plan.  If a facility agrees to accept pt and the plan changes in any way please call the facility to inform them of the change.  Final disposition is pending as of this writing.  Jalene Mullet, [place] Coordinator [phone]

## 2022-04-25 ENCOUNTER — Encounter: Payer: Medicare Other | Admitting: Family Medicine

## 2022-04-27 ENCOUNTER — Telehealth: Payer: Self-pay | Admitting: Family Medicine

## 2022-04-27 NOTE — Telephone Encounter (Signed)
Not able to make last visit as hospitalized.  She does have an appointment with me this Monday and we will review FL2 at that time.

## 2022-04-27 NOTE — Telephone Encounter (Signed)
Had some next step questions, will follow up Monday

## 2022-04-27 NOTE — Telephone Encounter (Signed)
Caller name:Steven (son)  On DPR? :yes/no: Yes  Call back number: [phone]  Provider they see: Fr. Carlota Raspberry  Reason for call:  Returning a call about his mother [name] MRN [id number] DOB [birth year]

## 2022-04-30 ENCOUNTER — Other Ambulatory Visit: Payer: Self-pay

## 2022-04-30 ENCOUNTER — Telehealth (INDEPENDENT_AMBULATORY_CARE_PROVIDER_SITE_OTHER): Payer: Medicare Other | Admitting: Family Medicine

## 2022-04-30 ENCOUNTER — Encounter: Payer: Self-pay | Admitting: Family Medicine

## 2022-04-30 DIAGNOSIS — E876 Hypokalemia: Secondary | ICD-10-CM

## 2022-04-30 DIAGNOSIS — E785 Hyperlipidemia, unspecified: Secondary | ICD-10-CM

## 2022-04-30 DIAGNOSIS — F325 Major depressive disorder, single episode, in full remission: Secondary | ICD-10-CM | POA: Diagnosis not present

## 2022-04-30 DIAGNOSIS — J45991 Cough variant asthma: Secondary | ICD-10-CM

## 2022-04-30 DIAGNOSIS — R7303 Prediabetes: Secondary | ICD-10-CM | POA: Diagnosis not present

## 2022-04-30 DIAGNOSIS — I10 Essential (primary) hypertension: Secondary | ICD-10-CM | POA: Diagnosis not present

## 2022-04-30 DIAGNOSIS — K219 Gastro-esophageal reflux disease without esophagitis: Secondary | ICD-10-CM | POA: Diagnosis not present

## 2022-04-30 DIAGNOSIS — D3A8 Other benign neuroendocrine tumors: Secondary | ICD-10-CM

## 2022-04-30 MED ORDER — ALBUTEROL SULFATE HFA 108 (90 BASE) MCG/ACT IN AERS: 1.0000 | INHALATION_SPRAY | Freq: Four times a day (QID) | RESPIRATORY_TRACT | 0 refills | Status: AC | PRN

## 2022-04-30 MED ORDER — AMLODIPINE BESYLATE 5 MG PO TABS: 5.0000 mg | ORAL_TABLET | Freq: Every day | ORAL | 3 refills | Status: AC

## 2022-04-30 MED ORDER — ATORVASTATIN CALCIUM 20 MG PO TABS: 20.0000 mg | ORAL_TABLET | Freq: Every day | ORAL | 5 refills | Status: AC

## 2022-04-30 NOTE — Patient Instructions (Addendum)
Call gastroenterology for follow up. Dr. Paulita Fujita Keep a record of your blood pressures outside of the office and bring them to the next office visit. Continue amlodipine '5mg'$  for now.  Restart atorvastatin once per day and recheck with labs in next month.  Potassium will need to be rechecked - lab visit at [place]: [place] in 8:30-4:30 during weekdays, no appointment needed [place].  [place], [place] [address]  Continue advair - can be taken 2 times per day, albuterol only if needed for wheezing or shortness of breath.            If you have lab work done today you will be contacted with your lab results within the next 2 weeks.  If you have not heard from [place] then please contact us. The fastest way to get your results is to register for My Chart.   IF you received an x-ray today, you will receive an invoice from [facility]. Please contact [facility] at [phone] with questions or concerns regarding your invoice.   IF you received labwork today, you will receive an invoice from [facility]. Please contact [facility] at [phone] with questions or concerns regarding your invoice.   Our billing staff will not be able to assist you with questions regarding bills from these companies.  You will be contacted with the lab results as soon as they are available. The fastest way to get your results is to activate your My Chart account. Instructions are located on the last page of this paperwork. If you have not heard from [place] regarding the results in 2 weeks, please contact this office.

## 2022-04-30 NOTE — Telephone Encounter (Signed)
This appt was complete, Dr Carlota Raspberry was running behind.

## 2022-04-30 NOTE — Progress Notes (Unsigned)
Virtual Visit via video note  I connected with [name] on 04/30/22 at 12:49 PM by a video enabled telemedicine application and verified that I am speaking with the correct person using two identifiers.  Patient location: at son [facility]. Consent obtained to discuss PHI.  My location: office - [place].    I discussed the limitations, risks, security and privacy concerns of performing an evaluation and management service by telephone and the availability of in person appointments. I also discussed with the patient that there may be a patient responsible charge related to this service. The patient expressed understanding and agreed to proceed, consent obtained  Chief complaint:  Chief Complaint  Patient presents with   Follow-up    Patient states she need to discuss FL2 forms and need them to be faxed back. PHQ9=9    History of Present Illness: [name] is a 76 y.o. female  Visit today to discuss FL 2 for new living situation.  Last visit with me in September 2022, follow-up from hospital in August 2022. Today's visit to discuss FL2 form for assisted living - [place] in [place]. Was living with grandson - prior bills did not get paid, evicted, lost vehicle. Has been staying with different family members in the meantime.  Independent in ADL's.  Meals will be provided at ALF.   Nausea/vomiting Multiple episodes earlier this year, seen through the ER, with URI symptoms.  Shortness of breath.  Treated with medications through the ER with improvement.  Multiple previous CT scans without obstructions.  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  No CT evidence for acute intra-abdominal or pelvic abnormality in January. Plan for ongoing monitoring of the neuroendocrine lesion of the pancreas with MRI every 6 months.  Previous testing indicated stable size, most recent MRI in March 2022.  Plan for pre and postcontrast MRI/MRCP or  pancreatic protocol CT in 2 years.  No evidence of metastatic disease in the abdomen. GI: Dr. Paulita Fujita,  She has taken Protonix for GERD in past. Only taking as needed otc antacid once per week.   Hypertension Treated with amlodipine 5 mg daily.  Previously on olmesartan and amlodipine. Currently taking amlodipine '5mg'$  qd. No home BP's, but told ok at [facility] BP Readings from Last 3 Encounters:  04/24/22 (!) 147/99  12/29/21 (!) 178/84  11/22/21 (!) 168/91    Hyperlipidemia Treated with Lipitor 20 mg daily - none used recently - ran out. No side effects on Lipitor prior.  Lab Results  Component Value Date   CHOL 201 (H) 05/29/2021   HDL 60.30 05/29/2021   LDLCALC 106 (H) 05/29/2021   TRIG 169.0 (H) 05/29/2021   CHOLHDL 3 05/29/2021    Cough variant asthma with some component of upper airway cough previously. Treated with Advair. Usually taking at night, sometimes uses during day if needed. Not needing albuterol.    Confusion/metabolic encephalopathy Noted at her hospital follow-up in August 2022.  TSH low, plan for outpatient recheck.  Gabapentin was also discontinued during hospitalization but had restarted for her low back pain.prior hyperkalemia - decreased supplement in 06/2021 with plan to repeat levels.  Hypokalemia 3.2 on 04/23/22 in ER eval.  TSH 0.45 in 06/2021.  On gabapentin '300mg'$  once per day for back pain.   Depression with Suicidal ideation: ER eval 04/23/22. Low potassium repleted.  Admitted to [facility]. Recently discharged today.  D/c meds:  Olanzapine 7.'5mg'$  QHS Sertraline '25mg'$  qd Trazaodone  $'50mg'd$  QHS  Hydroxyzine '25mg'$  Q6h as needed for anxiety - not needed.  Doing better. Denies current SI or HI.  Sleeping ok.  No known psychiatry follow up.   Tb skin test negative at [facility] on 04/20/22.   Lab Results  Component Value Date   HGBA1C 5.6 05/29/2021    Planned meeting with attorney to seek guardianship with son.   Immunization History  Administered  Date(s) Administered   Fluad Quad(high Dose 65+) 07/15/2019   Influenza, High Dose Seasonal PF Aug 19, 1946, 08/13/2018   Influenza-Unspecified 08/15/2016, 08/17/2017, 07/15/2019, 07/14/2020   Meningococcal Polysaccharide 07/17/2016   PFIZER(Purple Top)SARS-COV-2 Vaccination 11/10/2019, 12/01/2019, 07/21/2020, 03/02/2021   Pneumococcal Conjugate-13 09/13/2014   Tdap 02/02/2014     50 minutes spent during visit, including chart review, discussion of recent labs and form review, counseling and assimilation of information, exam, discussion of plan, and chart completion.        Patient Active Problem List   Diagnosis Date Noted   GERD (gastroesophageal reflux disease)    Hypokalemia    Intractable nausea and vomiting 01/27/2021   Prediabetes 02/14/2020   Essential hypertension 08/19/2017   Hyperlipidemia 08/19/2017   Cough variant asthma 04/04/2016   Upper airway cough syndrome 04/04/2016   Osteoarthritis of left shoulder 03/24/2012   Past Medical History:  Diagnosis Date   Arthritis    Asthma    Cancer ([place])    Pancreas   Complication of anesthesia    GERD (gastroesophageal reflux disease)    at times   Hyperlipidemia    Hypertension    Peripheral vascular disease ([place])    has spider veins   Pneumonia    Pneumothorax, traumatic    RIGHT; associated with multiple rib fractures after a fall   PONV (postoperative nausea and vomiting)    Pre-diabetes    Past Surgical History:  Procedure Laterality Date   ABDOMINAL HYSTERECTOMY  1982   AUGMENTATION MAMMAPLASTY Bilateral    Patient has bilateral implants    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   BREAST SURGERY     ESOPHAGOGASTRODUODENOSCOPY (EGD) WITH PROPOFOL N/A 01/05/2020   Procedure: ESOPHAGOGASTRODUODENOSCOPY (EGD) WITH PROPOFOL;  Surgeon: Arta Silence, MD;  [facility];  Service: Endoscopy;  Laterality: N/A;   EUS N/A 01/05/2020   Procedure: UPPER ENDOSCOPIC ULTRASOUND (EUS) LINEAR;  Surgeon: Arta Silence, MD;  [facility];  Service: Endoscopy;  Laterality: N/A;   FINE NEEDLE ASPIRATION N/A 01/05/2020   Procedure: FINE NEEDLE ASPIRATION (FNA) LINEAR;  Surgeon: Arta Silence, MD;  [facility];  Service: Endoscopy;  Laterality: N/A;   JOINT REPLACEMENT  2009   right knee   JOINT REPLACEMENT  2011   left knee   MASTECTOMY  2008   right breast   REDUCTION MAMMAPLASTY Left    Patient had lift with implant on Left side    SHOULDER ARTHROSCOPY DISTAL CLAVICLE EXCISION AND OPEN ROTATOR CUFF REPAIR  2011   TOTAL SHOULDER ARTHROPLASTY  03/14/2012   Procedure: TOTAL SHOULDER ARTHROPLASTY;  Surgeon: Alta Corning, MD;  Location: [facility];  Service: Orthopedics;  Laterality: Left;   Allergies  Allergen Reactions   Dilaudid [Hydromorphone Hcl] Itching   Morphine And Related Nausea And Vomiting   Prior to Admission medications   Medication Sig Start Date End Date Taking? Authorizing Provider  acetaminophen (TYLENOL) 500 MG tablet Take 1,000 mg by mouth daily as needed for mild pain or moderate pain.    [provider]  albuterol (VENTOLIN  HFA) 108 (90 Base) MCG/ACT inhaler Inhale 1-2 puffs into the lungs every 6 (six) hours as needed for wheezing or shortness of breath. 10/16/21   Prosperi, Christian H, PA-C  amLODipine (NORVASC) 5 MG tablet TAKE 1 TABLET(5 MG) BY MOUTH DAILY Patient taking differently: Take 5 mg by mouth daily. 12/20/21   [name], MD  atorvastatin (LIPITOR) 20 MG tablet Take 1 tablet (20 mg total) by mouth daily at 6 PM. Patient not taking: Reported on 04/24/2022 05/29/21   [name], MD  fluticasone-salmeterol (ADVAIR HFA) 8184809279 MCG/ACT inhaler Inhale 2 puffs into the lungs 2 (two) times daily. Patient not taking: Reported on 04/24/2022 05/29/21   [name], MD  gabapentin (NEURONTIN) 300 MG capsule Take 300 mg by mouth 3 (three) times daily. 04/18/22   [provider]  ibuprofen (ADVIL) 200 MG tablet Take 400 mg by mouth  2 (two) times daily as needed for pain.    [provider]  ondansetron (ZOFRAN ODT) 4 MG disintegrating tablet Take 1 tablet (4 mg total) by mouth every 8 (eight) hours as needed for nausea or vomiting. Patient not taking: Reported on 04/24/2022 11/22/21   Davonna Belling, MD  oxymetazoline (AFRIN) 0.05 % nasal spray Place 1 spray into both nostrils daily as needed for congestion.    [provider]  pantoprazole (PROTONIX) 40 MG tablet Take 1 tablet (40 mg total) by mouth 2 (two) times daily. Patient not taking: Reported on 06/22/2021 04/15/21   Aline August, MD  Respiratory Therapy Supplies (FLUTTER) DEVI 1 each by Does not apply route 2 (two) times daily. 07/15/19   Julian Hy, DO   Social History   Socioeconomic History   Marital status: Widowed    Spouse name: Not on file   Number of children: 2   Years of education: Not on file   Highest education level: Not on file  Occupational History   Occupation: Retired   Occupation: Glass blower/designer    Comment: Arts development officer  Tobacco Use   Smoking status: Never   Smokeless tobacco: Never  Vaping Use   Vaping Use: Never used  Substance and Sexual Activity   Alcohol use: No    Alcohol/week: 0.0 standard drinks of alcohol   Drug use: No   Sexual activity: Not on file  Other Topics Concern   Not on file  Social History Narrative   Not on file   Social Determinants of Health   Financial Resource Strain: Not on file  Food Insecurity: Not on file  Transportation Needs: Not on file  Physical Activity: Not on file  Stress: Not on file  Social Connections: Not on file  Intimate Partner Violence: Not on file    Observations/Objective: Nontoxic appearance on video.  Speaking in full sentences without respiratory distress.  Appropriate responses.  Euthymic mood with affect mood congruent.  Denies SI/HI.  No focal facial weakness appreciated.  All questions were answered with understanding of plan  expressed.  Assessment and Plan: Essential hypertension - Plan: amLODipine (NORVASC) 5 MG tablet  -Treated only with amlodipine at this time.  Consider restart of ARB but will have her monitor blood pressures at home and in office follow-up next few weeks.  Can repeat labs at that time.  Gastroesophageal reflux disease, unspecified whether esophagitis present  -Stable with only intermittent over-the-counter treatment.  No new meds for now.  Neuroendocrine tumor of pancreas  -Denies recent nausea/vomiting, appears that her last CT recommended 2-year follow-up,  stable on prior imaging.  Has been followed by gastroenterology.   Hyperlipidemia, unspecified hyperlipidemia type - Plan: atorvastatin (LIPITOR) 20 MG tablet  -Tolerating Lipitor, plan for updated labs at follow-up visit.  Hypokalemia - Plan: Basic metabolic panel  -Repleted during recent ER visit, lab only visit planned for BMP to assess status.  Cough variant asthma - Plan: albuterol (VENTOLIN HFA) 108 (90 Base) MCG/ACT inhaler  -Stable with Advair, albuterol provided if needed.  Dosing of Advair discussed.  Prediabetes  -Plan on updated lab work at follow-up visit.  Major depressive disorder, single episode, in remission ([place])  -Improved with new meds from inpatient psychiatric treatment.  Unfortunately has had some difficult living situations, will now be going to assisted living, paperwork completed and faxed FL 2.  Follow Up Instructions: Patient Instructions  Call gastroenterology for follow up. Dr. Paulita Fujita Keep a record of your blood pressures outside of the office and bring them to the next office visit. Continue amlodipine '5mg'$  for now.  Restart atorvastatin once per day and recheck with labs in next month.  Potassium will need to be rechecked - lab visit at [facility]: [place] in 8:30-4:30 during weekdays, no appointment needed [place].  [place], [place] [address]  Continue advair - can be taken 2 times  per day, albuterol only if needed for wheezing or shortness of breath.            If you have lab work done today you will be contacted with your lab results within the next 2 weeks.  If you have not heard from [place] then please contact us. The fastest way to get your results is to register for My Chart.   IF you received an x-ray today, you will receive an invoice from [facility]. Please contact [facility] at [phone] with questions or concerns regarding your invoice.   IF you received labwork today, you will receive an invoice from [facility]. Please contact [facility] at [phone] with questions or concerns regarding your invoice.   Our billing staff will not be able to assist you with questions regarding bills from these companies.  You will be contacted with the lab results as soon as they are available. The fastest way to get your results is to activate your My Chart account. Instructions are located on the last page of this paperwork. If you have not heard from [place] regarding the results in 2 weeks, please contact this office.        I discussed the assessment and treatment plan with the patient. The patient was provided an opportunity to ask questions and all were answered. The patient agreed with the plan and demonstrated an understanding of the instructions.   The patient was advised to call back or seek an in-person evaluation if the symptoms worsen or if the condition fails to improve as anticipated.   [name], MD

## 2022-05-01 ENCOUNTER — Encounter: Payer: Self-pay | Admitting: Family Medicine

## 2022-05-09 ENCOUNTER — Emergency Department (HOSPITAL_BASED_OUTPATIENT_CLINIC_OR_DEPARTMENT_OTHER): Payer: Medicare Other

## 2022-05-09 ENCOUNTER — Encounter: Payer: Self-pay | Admitting: Family Medicine

## 2022-05-09 ENCOUNTER — Ambulatory Visit (INDEPENDENT_AMBULATORY_CARE_PROVIDER_SITE_OTHER): Payer: Medicare Other | Admitting: Family Medicine

## 2022-05-09 ENCOUNTER — Observation Stay (HOSPITAL_BASED_OUTPATIENT_CLINIC_OR_DEPARTMENT_OTHER)
Admission: EM | Admit: 2022-05-09 | Discharge: 2022-05-12 | Disposition: A | Discharge status: Home / Self Care | Payer: Medicare Other | Attending: Internal Medicine | Admitting: Internal Medicine

## 2022-05-09 ENCOUNTER — Other Ambulatory Visit: Payer: Self-pay

## 2022-05-09 ENCOUNTER — Encounter (HOSPITAL_BASED_OUTPATIENT_CLINIC_OR_DEPARTMENT_OTHER): Payer: Self-pay | Admitting: Pediatrics

## 2022-05-09 VITALS — BP 140/78 | HR 111 | Temp 98.0°F | Resp 16 | Ht 65.0 in | Wt 125.4 lb

## 2022-05-09 DIAGNOSIS — E440 Moderate protein-calorie malnutrition: Secondary | ICD-10-CM | POA: Insufficient documentation

## 2022-05-09 DIAGNOSIS — R1031 Right lower quadrant pain: Secondary | ICD-10-CM

## 2022-05-09 DIAGNOSIS — Z96612 Presence of left artificial shoulder joint: Secondary | ICD-10-CM | POA: Diagnosis not present

## 2022-05-09 DIAGNOSIS — Z79899 Other long term (current) drug therapy: Secondary | ICD-10-CM | POA: Diagnosis not present

## 2022-05-09 DIAGNOSIS — T50901A Poisoning by unspecified drugs, medicaments and biological substances, accidental (unintentional), initial encounter: Secondary | ICD-10-CM | POA: Diagnosis not present

## 2022-05-09 DIAGNOSIS — Z96653 Presence of artificial knee joint, bilateral: Secondary | ICD-10-CM | POA: Diagnosis not present

## 2022-05-09 DIAGNOSIS — Z8507 Personal history of malignant neoplasm of pancreas: Secondary | ICD-10-CM | POA: Diagnosis not present

## 2022-05-09 DIAGNOSIS — I10 Essential (primary) hypertension: Secondary | ICD-10-CM

## 2022-05-09 DIAGNOSIS — K6389 Other specified diseases of intestine: Secondary | ICD-10-CM | POA: Diagnosis not present

## 2022-05-09 DIAGNOSIS — R7401 Elevation of levels of liver transaminase levels: Secondary | ICD-10-CM | POA: Diagnosis not present

## 2022-05-09 DIAGNOSIS — J4520 Mild intermittent asthma, uncomplicated: Secondary | ICD-10-CM | POA: Diagnosis not present

## 2022-05-09 DIAGNOSIS — F325 Major depressive disorder, single episode, in full remission: Secondary | ICD-10-CM

## 2022-05-09 DIAGNOSIS — E876 Hypokalemia: Secondary | ICD-10-CM

## 2022-05-09 DIAGNOSIS — E860 Dehydration: Secondary | ICD-10-CM | POA: Diagnosis not present

## 2022-05-09 DIAGNOSIS — G929 Unspecified toxic encephalopathy: Secondary | ICD-10-CM | POA: Diagnosis not present

## 2022-05-09 DIAGNOSIS — R410 Disorientation, unspecified: Secondary | ICD-10-CM | POA: Diagnosis not present

## 2022-05-09 DIAGNOSIS — Z91148 Patient's other noncompliance with medication regimen for other reason: Secondary | ICD-10-CM | POA: Diagnosis not present

## 2022-05-09 DIAGNOSIS — F05 Delirium due to known physiological condition: Secondary | ICD-10-CM

## 2022-05-09 DIAGNOSIS — G9340 Encephalopathy, unspecified: Secondary | ICD-10-CM | POA: Diagnosis present

## 2022-05-09 DIAGNOSIS — R112 Nausea with vomiting, unspecified: Secondary | ICD-10-CM

## 2022-05-09 DIAGNOSIS — R4182 Altered mental status, unspecified: Secondary | ICD-10-CM | POA: Diagnosis not present

## 2022-05-09 DIAGNOSIS — E785 Hyperlipidemia, unspecified: Secondary | ICD-10-CM | POA: Diagnosis present

## 2022-05-09 DIAGNOSIS — R1030 Lower abdominal pain, unspecified: Secondary | ICD-10-CM

## 2022-05-09 LAB — URINALYSIS, ROUTINE W REFLEX MICROSCOPIC
Bilirubin Urine: NEGATIVE
Glucose, UA: NEGATIVE mg/dL
Hgb urine dipstick: NEGATIVE
Ketones, ur: NEGATIVE mg/dL
Leukocytes,Ua: NEGATIVE
Nitrite: NEGATIVE
Protein, ur: NEGATIVE mg/dL
Specific Gravity, Urine: 1.015 (ref 1.005–1.030)
pH: 6.5 (ref 5.0–8.0)

## 2022-05-09 LAB — MAGNESIUM: Magnesium: 2 mg/dL (ref 1.7–2.4)

## 2022-05-09 LAB — CBC
HCT: 39.1 % (ref 36.0–46.0)
Hemoglobin: 13.4 g/dL (ref 12.0–15.0)
MCH: 32.8 pg (ref 26.0–34.0)
MCHC: 34.3 g/dL (ref 30.0–36.0)
MCV: 95.6 fL (ref 80.0–100.0)
Platelets: 375 10*3/uL (ref 150–400)
RBC: 4.09 MIL/uL (ref 3.87–5.11)
RDW: 12.4 % (ref 11.5–15.5)
WBC: 8.6 10*3/uL (ref 4.0–10.5)
nRBC: 0 % (ref 0.0–0.2)

## 2022-05-09 LAB — COMPREHENSIVE METABOLIC PANEL
ALT: 55 U/L — ABNORMAL HIGH (ref 0–44)
AST: 48 U/L — ABNORMAL HIGH (ref 15–41)
Albumin: 4.3 g/dL (ref 3.5–5.0)
Alkaline Phosphatase: 108 U/L (ref 38–126)
Anion gap: 10 (ref 5–15)
BUN: 16 mg/dL (ref 8–23)
CO2: 22 mmol/L (ref 22–32)
Calcium: 9.5 mg/dL (ref 8.9–10.3)
Chloride: 105 mmol/L (ref 98–111)
Creatinine, Ser: 0.6 mg/dL (ref 0.44–1.00)
GFR, Estimated: >60 mL/min (ref >60)
Glucose, Bld: 126 mg/dL — ABNORMAL HIGH (ref 70–99)
Potassium: 3.7 mmol/L (ref 3.5–5.1)
Sodium: 137 mmol/L (ref 135–145)
Total Bilirubin: 0.8 mg/dL (ref 0.3–1.2)
Total Protein: 7.2 g/dL (ref 6.5–8.1)

## 2022-05-09 LAB — ETHANOL: Alcohol, Ethyl (B): <10 mg/dL (ref <10)

## 2022-05-09 LAB — LIPASE, BLOOD: Lipase: 33 U/L (ref 11–51)

## 2022-05-09 LAB — RAPID URINE DRUG SCREEN, HOSP PERFORMED
Amphetamines: NOT DETECTED
Barbiturates: NOT DETECTED
Benzodiazepines: NOT DETECTED
Cocaine: NOT DETECTED
Opiates: NOT DETECTED
Tetrahydrocannabinol: NOT DETECTED

## 2022-05-09 LAB — ACETAMINOPHEN LEVEL: Acetaminophen (Tylenol), Serum: <10 ug/mL — ABNORMAL LOW (ref 10–30)

## 2022-05-09 LAB — PROTIME-INR
INR: 0.9 (ref 0.8–1.2)
Prothrombin Time: 12.5 seconds (ref 11.4–15.2)

## 2022-05-09 LAB — SALICYLATE LEVEL: Salicylate Lvl: <7 mg/dL — ABNORMAL LOW (ref 7.0–30.0)

## 2022-05-09 MED ORDER — ONDANSETRON HCL 4 MG/2ML IJ SOLN
4.0000 mg | Freq: Once | INTRAMUSCULAR | Status: AC
  Administered 2022-05-09: 4 mg via INTRAVENOUS
  Filled 2022-05-09: qty 2

## 2022-05-09 MED ORDER — LORAZEPAM 2 MG/ML IJ SOLN
0.5000 mg | Freq: Once | INTRAMUSCULAR | Status: AC
  Administered 2022-05-09: 0.5 mg via INTRAVENOUS
  Filled 2022-05-09: qty 1

## 2022-05-09 MED ORDER — SODIUM CHLORIDE 0.9 % IV BOLUS
500.0000 mL | Freq: Once | INTRAVENOUS | Status: AC
Start: 2022-05-09 — End: 2022-05-09
  Administered 2022-05-09: 500 mL via INTRAVENOUS

## 2022-05-09 MED ORDER — ACETYLCYSTEINE LOAD VIA INFUSION
150.0000 mg/kg | Freq: Once | INTRAVENOUS | Status: DC
  Filled 2022-05-09: qty 280

## 2022-05-09 MED ORDER — IOHEXOL 300 MG/ML  SOLN
100.0000 mL | Freq: Once | INTRAMUSCULAR | Status: AC | PRN
  Administered 2022-05-09: 100 mL via INTRAVENOUS

## 2022-05-09 MED ORDER — FAMOTIDINE IN NACL 20-0.9 MG/50ML-% IV SOLN
20.0000 mg | Freq: Once | INTRAVENOUS | Status: AC
  Administered 2022-05-09: 20 mg via INTRAVENOUS
  Filled 2022-05-09: qty 50

## 2022-05-09 MED ORDER — ONDANSETRON HCL 4 MG/2ML IJ SOLN
4.0000 mg | Freq: Once | INTRAMUSCULAR | Status: AC
Start: 2022-05-09 — End: 2022-05-09
  Administered 2022-05-09: 4 mg via INTRAVENOUS
  Filled 2022-05-09: qty 2

## 2022-05-09 MED ORDER — DEXTROSE 5 % IV SOLN
15.0000 mg/kg/h | INTRAVENOUS | Status: DC
  Filled 2022-05-09: qty 90

## 2022-05-09 NOTE — ED Notes (Signed)
Lab to add acetaminophen, salicylate, magnesium, PT-INR and hepatitis panel to current sample. Please send new dark green top for Etoh leve.

## 2022-05-09 NOTE — Progress Notes (Signed)
Subjective:  Patient ID: [name], female    DOB: 1945-12-15  Age: 76 y.o. MRN: [id number]  CC:  Chief Complaint  Patient presents with   Follow-up    Pt son Annie Main present with her today, reports that she is being taken care of at [facility], notes she is extremely nauseated today, pt over took meds doubling morning and afternoon doses for 4 days last week    HPI [name] presents for  Initial plan for review of chronic medical problems, after video visit July 17.  At that time discussed her medications and completed paperwork for admission to [facility] assisted living.  Nausea/vomiting: See last visit, multiple episodes with ER evaluations, last in 11/22/21 with URI symptoms.   ER treatment improved symptoms with zofran, compazine. Multiple previous CT scanswithout obstruction.  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  Most recent CT without evidence of acute intra-abdominal or pelvic abnormality in January of this year.  Episodic use of Protonix for GERD discussed last visit, with over-the-counter antacid once per week.  Gastroenterologist Dr. Paulita Fujita. No recent visit.   Recent nausea and vomiting started this morning. Primarily dry heaves. Son called by facility at home this am. She was confused about why he did not pick her up yesterday. Concern about meds, overuse -  count indicated that at least double or more of some of her meds. 15 sertraline in past 9 days, 15 trazodone and 15 olanzapine all in 9 days. 36 gabapentin in past 8 days. 190 tylenol in past 9 days. Unknown when some meds ran out.  Note received from [facility] living for today's visit indicating increased confusion, not taking medications, blood pressure was 180/90 this am, question of that facility taking over medication administration until she returned to baseline mental status.  Hypokalemia noted on 04/23/22 labs. Ordered BMP at last visit 7/17- not performed.   She did not  remember taking additional medication. Denies SI or intentional overdose.  No marijuana or CBD.  No alcohol.  No current chest pain. Stomach feels tight, not painful. Nauseous with vomiting in office.  Hx of constipation. Uses water enema daily for BM - last one yesterday. No fever.  No HA.  Fingertips numb/tingling for past few months.  Drank some fluids this am, unsure if any food.  No prior abdominal surgeries.  Some anxiety with getting used to new living situation at [facility]   History Patient Active Problem List   Diagnosis Date Noted   GERD (gastroesophageal reflux disease)    Hypokalemia    Intractable nausea and vomiting 01/27/2021   Prediabetes 02/14/2020   Essential hypertension 08/19/2017   Hyperlipidemia 08/19/2017   Cough variant asthma 04/04/2016   Upper airway cough syndrome 04/04/2016   Osteoarthritis of left shoulder 03/24/2012   Past Medical History:  Diagnosis Date   Arthritis    Asthma    Cancer ([place])    Pancreas   Complication of anesthesia    GERD (gastroesophageal reflux disease)    at times   Hyperlipidemia    Hypertension    Peripheral vascular disease ([place])    has spider veins   Pneumonia    Pneumothorax, traumatic    RIGHT; associated with multiple rib fractures after a fall   PONV (postoperative nausea and vomiting)    Pre-diabetes    Past Surgical History:  Procedure Laterality Date   ABDOMINAL HYSTERECTOMY  1982   AUGMENTATION MAMMAPLASTY Bilateral    Patient has bilateral implants  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   BREAST SURGERY     ESOPHAGOGASTRODUODENOSCOPY (EGD) WITH PROPOFOL N/A 01/05/2020   Procedure: ESOPHAGOGASTRODUODENOSCOPY (EGD) WITH PROPOFOL;  Surgeon: Arta Silence, MD;  [facility];  Service: Endoscopy;  Laterality: N/A;   EUS N/A 01/05/2020   Procedure: UPPER ENDOSCOPIC ULTRASOUND (EUS) LINEAR;  Surgeon: Arta Silence, MD;  [facility];  Service: Endoscopy;  Laterality: N/A;   FINE  NEEDLE ASPIRATION N/A 01/05/2020   Procedure: FINE NEEDLE ASPIRATION (FNA) LINEAR;  Surgeon: Arta Silence, MD;  [facility];  Service: Endoscopy;  Laterality: N/A;   JOINT REPLACEMENT  2009   right knee   JOINT REPLACEMENT  2011   left knee   MASTECTOMY  2008   right breast   REDUCTION MAMMAPLASTY Left    Patient had lift with implant on Left side    SHOULDER ARTHROSCOPY DISTAL CLAVICLE EXCISION AND OPEN ROTATOR CUFF REPAIR  2011   TOTAL SHOULDER ARTHROPLASTY  03/14/2012   Procedure: TOTAL SHOULDER ARTHROPLASTY;  Surgeon: Alta Corning, MD;  Location: [facility];  Service: Orthopedics;  Laterality: Left;   Allergies  Allergen Reactions   Dilaudid [Hydromorphone Hcl] Itching   Morphine And Related Nausea And Vomiting   Prior to Admission medications   Medication Sig Start Date End Date Taking? Authorizing Provider  acetaminophen (TYLENOL) 500 MG tablet Take 1,000 mg by mouth daily as needed for mild pain or moderate pain.   Yes [provider]  albuterol (VENTOLIN HFA) 108 (90 Base) MCG/ACT inhaler Inhale 1-2 puffs into the lungs every 6 (six) hours as needed for wheezing or shortness of breath. 04/30/22  Yes [name], MD  amLODipine (NORVASC) 5 MG tablet Take 1 tablet (5 mg total) by mouth daily. TAKE 1 TABLET(5 MG) BY MOUTH DAILY 04/30/22  Yes [name], MD  atorvastatin (LIPITOR) 20 MG tablet Take 1 tablet (20 mg total) by mouth daily at 6 PM. 04/30/22  Yes [name], MD  gabapentin (NEURONTIN) 300 MG capsule Take 300 mg by mouth 3 (three) times daily. 04/18/22  Yes [provider]  ibuprofen (ADVIL) 200 MG tablet Take 400 mg by mouth 2 (two) times daily as needed for pain.   Yes [provider]  fluticasone-salmeterol (ADVAIR HFA) 115-21 MCG/ACT inhaler Inhale 2 puffs into the lungs 2 (two) times daily. Patient not taking: Reported on 04/24/2022 05/29/21   [name], MD  ondansetron (ZOFRAN ODT) 4 MG disintegrating tablet  Take 1 tablet (4 mg total) by mouth every 8 (eight) hours as needed for nausea or vomiting. Patient not taking: Reported on 04/24/2022 11/22/21   Davonna Belling, MD  pantoprazole (PROTONIX) 40 MG tablet Take 1 tablet (40 mg total) by mouth 2 (two) times daily. Patient not taking: Reported on 06/22/2021 04/15/21   Aline August, MD   Social History   Socioeconomic History   Marital status: Widowed    Spouse name: Not on file   Number of children: 2   Years of education: Not on file   Highest education level: Not on file  Occupational History   Occupation: Retired   Occupation: Glass blower/designer    Comment: Arts development officer  Tobacco Use   Smoking status: Never   Smokeless tobacco: Never  Vaping Use   Vaping Use: Never used  Substance and Sexual Activity   Alcohol use: No    Alcohol/week: 0.0 standard drinks of alcohol   Drug use: No   Sexual  activity: Not on file  Other Topics Concern   Not on file  Social History Narrative   Not on file   Social Determinants of Health   Financial Resource Strain: Not on file  Food Insecurity: Not on file  Transportation Needs: Not on file  Physical Activity: Not on file  Stress: Not on file  Social Connections: Not on file  Intimate Partner Violence: Not on file    Review of Systems Per HPI.   Objective:   Vitals:   05/09/22 1402  BP: 140/78  Pulse: (!) 111  Resp: 16  Temp: 98 F (36.7 C)  TempSrc: Temporal  SpO2: 96%  Weight: 125 lb 6.4 oz (56.9 kg)  Height: '5\' 5"'$  (1.651 m)     Physical Exam Vitals reviewed.  Constitutional:      Appearance: Normal appearance. She is well-developed.     Comments: Dry heaving at times during visit, but interactive, nontoxic-appearing at other portions of visit.  Responds appropriately to questions.  HENT:     Head: Normocephalic and atraumatic.  Eyes:     Conjunctiva/sclera: Conjunctivae normal.     Pupils: Pupils are equal, round, and reactive to light.  Neck:     Vascular:  No carotid bruit.  Cardiovascular:     Rate and Rhythm: Normal rate and regular rhythm.     Heart sounds: Normal heart sounds.  Pulmonary:     Effort: Pulmonary effort is normal.     Breath sounds: Normal breath sounds.  Abdominal:     General: There is no distension.     Palpations: Abdomen is soft. There is no pulsatile mass.     Tenderness: There is abdominal tenderness (Right lower quadrant tender.). There is no guarding.  Musculoskeletal:     Right lower leg: No edema.     Left lower leg: No edema.  Skin:    General: Skin is warm and dry.  Neurological:     Mental Status: She is alert and oriented to person, place, and time.  Psychiatric:        Behavior: Behavior normal.     Comments: Tearful, cooperative.     42 minutes spent during visit, including chart review, additional history regarding overuse of medications with additional history provided by son, paperwork review from assisted living, counseling and assimilation of information, exam, discussion of plan, and chart completion.     Assessment & Plan:  [name] is a 76 y.o. female . Nausea and vomiting, unspecified vomiting type  Overuse of medication  RLQ abdominal pain  Hypokalemia  Essential hypertension  Major depressive disorder, single episode, in remission [facility]) - Plan: Ambulatory referral to Psychiatry  Difficult situation.  Recurrence of nausea and vomiting, seen a few times earlier in the year, last year with same but recently appears to have accidentally overused medications.  Pre-existing hypokalemia without recent level.  Right lower quadrant abdominal pain.  Some confusion reported from assisted living, unsure if this is delirium versus side effect/results of additional medication use.  Denies suicidal attempt or intentional overdose.  Hypertensive based on reading from assisted living, blood pressure stable in office but tachycardic.  -ER evaluation today to evaluate overuse/overdose of  medications including significant acetaminophen based on history.  Electrolytes including potassium will need to be evaluated with prior hypokalemia.  -Right lower quadrant abdominal pain with nausea and vomiting, may need repeat imaging, eval through ER  -When she is discharged, medication management initially with assisted living.  -Referral placed to  psychiatry for ongoing medication management and treatment of her depression.  -1 week follow-up  No orders of the defined types were placed in this encounter.  Patient Instructions  Please proceed to the emergency room to evaluate for causes of nausea and vomiting and further evaluation of the excess medication use recently.  They should also know about your lower abdominal pain with this nausea and vomiting.  Medications should be dispensed temporarily from the assisted living, and I will place a referral to psychiatry.  Follow-up with me within 1 week, but will look for information from the hospital/ER for other recommendations.      Signed,   Merri Ray, MD [place], [facility] 05/09/22 3:04 PM

## 2022-05-09 NOTE — ED Notes (Signed)
Pt up to [facility] to void.

## 2022-05-09 NOTE — ED Notes (Signed)
[facility] RN to return call for report

## 2022-05-09 NOTE — Patient Instructions (Signed)
Please proceed to the emergency room to evaluate for causes of nausea and vomiting and further evaluation of the excess medication use recently.  They should also know about your lower abdominal pain with this nausea and vomiting.  Medications should be dispensed temporarily from the assisted living, and I will place a referral to psychiatry.  Follow-up with me within 1 week, but will look for information from the hospital/ER for other recommendations.

## 2022-05-09 NOTE — ED Triage Notes (Signed)
Son at bedside; reported was sent by PCP for further eval; concern for polypharmacy, nausea and abdominal pain; reported recently moved to assisted living facility and was prescribed some medications to help with transition. Concern for improper dosage of the medications the last few days. Patient is tearful in triage.

## 2022-05-09 NOTE — ED Provider Notes (Signed)
[facility] Provider Note   CSN: [id number] Arrival date & time: 05/09/22  1621     History {Add pertinent medical, surgical, social history, OB history to HPI:1} Chief Complaint  Patient presents with   Nausea   Abdominal Pain    [name] is a 76 y.o. female.  She is brought in by her son for evaluation of low abdominal pain nausea vomiting and concern for possible medication overdose.  He said he usually puts out her medications for about 2 weeks.  Last refill medications the 15th.  Was called by facility for her acting more confused over the last 2 days.  When he checked on her medications he found that they were empty along with the full bottle of Tylenol possibly 200 tablets.  Patient does not recall taking anything above her prescribed level of medications.  She is complaining of some low abdominal pain that has been intermittent for months and worse today.  Associated with some nausea and vomiting.  Denies any fevers chills chest pain shortness of breath.  Has some chronic constipation.  No diarrhea or urinary symptoms.    Patient saw PCP today and in his note he commented upon her medication usage. "Recent nausea and vomiting started this morning. Primarily dry heaves. Son called by facility at home this am. She was confused about why he did not pick her up yesterday. Concern about meds, overuse -  count indicated that at least double or more of some of her meds. 15 sertraline in past 9 days, 15 trazodone and 15 olanzapine all in 9 days. 36 gabapentin in past 8 days. 190 tylenol in past 9 days. Unknown when some meds ran out.  Note received from [facility] living for today's visit indicating increased confusion, not taking medications, blood pressure was 180/90 this am, question of that facility taking over medication administration until she returned to baseline mental status." Per Dr Carlota Raspberry.   It is unknown if these medications were taken all  at once or over the course of a few days.  Ingestion seems to have happened after the 15th and before today.  The history is provided by the patient.  Abdominal Pain Pain location:  LLQ, RLQ and suprapubic Pain quality: cramping   Pain severity:  Moderate Onset quality:  Gradual Duration:  1 day Timing:  Intermittent Progression:  Unchanged Chronicity:  Recurrent Context: not trauma   Relieved by:  None tried Worsened by:  Nothing Ineffective treatments:  None tried Associated symptoms: constipation, nausea and vomiting   Associated symptoms: no chest pain, no cough, no diarrhea, no dysuria, no fever, no shortness of breath and no sore throat        Home Medications Prior to Admission medications   Medication Sig Start Date End Date Taking? Authorizing Provider  acetaminophen (TYLENOL) 500 MG tablet Take 1,000 mg by mouth daily as needed for mild pain or moderate pain.    [provider]  albuterol (VENTOLIN HFA) 108 (90 Base) MCG/ACT inhaler Inhale 1-2 puffs into the lungs every 6 (six) hours as needed for wheezing or shortness of breath. 04/30/22   [name], MD  amLODipine (NORVASC) 5 MG tablet Take 1 tablet (5 mg total) by mouth daily. TAKE 1 TABLET(5 MG) BY MOUTH DAILY 04/30/22   [name], MD  atorvastatin (LIPITOR) 20 MG tablet Take 1 tablet (20 mg total) by mouth daily at 6 PM. 04/30/22   [name], MD  fluticasone-salmeterol (  ADVAIR HFA) 115-21 MCG/ACT inhaler Inhale 2 puffs into the lungs 2 (two) times daily. Patient not taking: Reported on 04/24/2022 05/29/21   [name], MD  gabapentin (NEURONTIN) 300 MG capsule Take 300 mg by mouth 3 (three) times daily. 04/18/22   [provider]  ibuprofen (ADVIL) 200 MG tablet Take 400 mg by mouth 2 (two) times daily as needed for pain.    [provider]  ondansetron (ZOFRAN ODT) 4 MG disintegrating tablet Take 1 tablet (4 mg total) by mouth every 8 (eight) hours as needed for  nausea or vomiting. Patient not taking: Reported on 04/24/2022 11/22/21   Davonna Belling, MD  pantoprazole (PROTONIX) 40 MG tablet Take 1 tablet (40 mg total) by mouth 2 (two) times daily. Patient not taking: Reported on 06/22/2021 04/15/21   Aline August, MD      Allergies    Dilaudid [hydromorphone hcl] and Morphine and related    Review of Systems   Review of Systems  Constitutional:  Negative for fever.  HENT:  Negative for sore throat.   Respiratory:  Negative for cough and shortness of breath.   Cardiovascular:  Negative for chest pain.  Gastrointestinal:  Positive for abdominal pain, constipation, nausea and vomiting. Negative for diarrhea.  Genitourinary:  Negative for dysuria.  Skin:  Negative for rash.    Physical Exam Updated Vital Signs BP (!) 157/90 (BP Location: Left Arm)   Pulse 98   Temp 98 F (36.7 C)   Resp 17   Ht '5\' 5"'$  (1.651 m)   Wt 56.9 kg   SpO2 99%   BMI 20.87 kg/m  Physical Exam Vitals and nursing note reviewed.  Constitutional:      General: She is not in acute distress.    Appearance: Normal appearance. She is well-developed.  HENT:     Head: Normocephalic and atraumatic.  Eyes:     Conjunctiva/sclera: Conjunctivae normal.  Cardiovascular:     Rate and Rhythm: Normal rate and regular rhythm.     Heart sounds: No murmur heard. Pulmonary:     Effort: Pulmonary effort is normal. No respiratory distress.     Breath sounds: Normal breath sounds.  Abdominal:     Palpations: Abdomen is soft.     Tenderness: There is no abdominal tenderness. There is no guarding or rebound.  Musculoskeletal:        General: Normal range of motion.     Cervical back: Neck supple.     Right lower leg: No edema.     Left lower leg: No edema.  Skin:    General: Skin is warm and dry.     Capillary Refill: Capillary refill takes less than 2 seconds.  Neurological:     General: No focal deficit present.     Mental Status: She is alert.     ED Results /  Procedures / Treatments   Labs (all labs ordered are listed, but only abnormal results are displayed) Labs Reviewed  COMPREHENSIVE METABOLIC PANEL - Abnormal; Notable for the following components:      Result Value   Glucose, Bld 126 (*)    AST 48 (*)    ALT 55 (*)    All other components within normal limits  LIPASE, BLOOD  CBC  URINALYSIS, ROUTINE W REFLEX MICROSCOPIC  ACETAMINOPHEN LEVEL  SALICYLATE LEVEL  ETHANOL  RAPID URINE DRUG SCREEN, HOSP PERFORMED  MAGNESIUM  PROTIME-INR  HEPATITIS PANEL, ACUTE    EKG None  Radiology No results found.  Procedures Procedures  {Document cardiac monitor, telemetry assessment procedure when appropriate:1}  Medications Ordered in ED Medications  ondansetron (ZOFRAN) injection 4 mg (has no administration in time range)  sodium chloride 0.9 % bolus 500 mL (has no administration in time range)    ED Course/ Medical Decision Making/ A&P                           Medical Decision Making Amount and/or Complexity of Data Reviewed Labs: ordered. Radiology: ordered.  Risk Prescription drug management.   This patient complains of ***; this involves an extensive number of treatment Options and is a complaint that carries with it a high risk of complications and morbidity. The differential includes ***  I ordered, reviewed and interpreted labs, which included *** I ordered medication *** and reviewed PMP when indicated. I ordered imaging studies which included *** and I independently    visualized and interpreted imaging which showed *** Additional history obtained from *** Previous records obtained and reviewed *** I consulted *** and discussed lab and imaging findings and discussed disposition.  Cardiac monitoring reviewed, *** Social determinants considered, *** Critical Interventions: ***  After the interventions stated above, I reevaluated the patient and found *** Admission and further testing considered,  ***   {Document critical care time when appropriate:1} {Document review of labs and clinical decision tools ie heart score, Chads2Vasc2 etc:1}  {Document your independent review of radiology images, and any outside records:1} {Document your discussion with family members, caretakers, and with consultants:1} {Document social determinants of health affecting pt's care:1} {Document your decision making why or why not admission, treatments were needed:1} Final Clinical Impression(s) / ED Diagnoses Final diagnoses:  None    Rx / DC Orders ED Discharge Orders     None

## 2022-05-09 NOTE — ED Notes (Signed)
Called son regarding admission and room number and phone number given

## 2022-05-09 NOTE — ED Notes (Signed)
[place] and sprite given to pt.

## 2022-05-09 NOTE — ED Notes (Signed)
Patient transported to CT 

## 2022-05-09 NOTE — ED Notes (Signed)
Report given to Thedore Mins with Karen Chafe and [employer] RN @ [facility]

## 2022-05-09 NOTE — ED Notes (Signed)
Pt more confused and is becoming anxious.  Continues to be nauseous and heaving.   Son is very concerned about her returning to ALF and would like for her to be admitted

## 2022-05-10 ENCOUNTER — Observation Stay (HOSPITAL_COMMUNITY): Payer: Medicare Other

## 2022-05-10 DIAGNOSIS — R7401 Elevation of levels of liver transaminase levels: Secondary | ICD-10-CM

## 2022-05-10 DIAGNOSIS — Z8507 Personal history of malignant neoplasm of pancreas: Secondary | ICD-10-CM | POA: Diagnosis not present

## 2022-05-10 DIAGNOSIS — T50901A Poisoning by unspecified drugs, medicaments and biological substances, accidental (unintentional), initial encounter: Secondary | ICD-10-CM

## 2022-05-10 DIAGNOSIS — E860 Dehydration: Secondary | ICD-10-CM | POA: Diagnosis not present

## 2022-05-10 DIAGNOSIS — Z79899 Other long term (current) drug therapy: Secondary | ICD-10-CM | POA: Diagnosis not present

## 2022-05-10 DIAGNOSIS — S2231XA Fracture of one rib, right side, initial encounter for closed fracture: Secondary | ICD-10-CM | POA: Diagnosis not present

## 2022-05-10 DIAGNOSIS — G929 Unspecified toxic encephalopathy: Secondary | ICD-10-CM | POA: Diagnosis not present

## 2022-05-10 DIAGNOSIS — E440 Moderate protein-calorie malnutrition: Secondary | ICD-10-CM | POA: Diagnosis not present

## 2022-05-10 DIAGNOSIS — J4520 Mild intermittent asthma, uncomplicated: Secondary | ICD-10-CM | POA: Diagnosis not present

## 2022-05-10 DIAGNOSIS — Z96612 Presence of left artificial shoulder joint: Secondary | ICD-10-CM | POA: Diagnosis not present

## 2022-05-10 DIAGNOSIS — Z96653 Presence of artificial knee joint, bilateral: Secondary | ICD-10-CM | POA: Diagnosis not present

## 2022-05-10 DIAGNOSIS — R7989 Other specified abnormal findings of blood chemistry: Secondary | ICD-10-CM

## 2022-05-10 DIAGNOSIS — E785 Hyperlipidemia, unspecified: Secondary | ICD-10-CM | POA: Diagnosis not present

## 2022-05-10 DIAGNOSIS — E876 Hypokalemia: Secondary | ICD-10-CM | POA: Diagnosis not present

## 2022-05-10 DIAGNOSIS — G9340 Encephalopathy, unspecified: Secondary | ICD-10-CM

## 2022-05-10 DIAGNOSIS — I10 Essential (primary) hypertension: Secondary | ICD-10-CM | POA: Diagnosis not present

## 2022-05-10 LAB — CBC WITH DIFFERENTIAL/PLATELET
Abs Immature Granulocytes: 0.02 10*3/uL (ref 0.00–0.07)
Basophils Absolute: 0.1 10*3/uL (ref 0.0–0.1)
Basophils Relative: 1 %
Eosinophils Absolute: 0.1 10*3/uL (ref 0.0–0.5)
Eosinophils Relative: 2 %
HCT: 35.7 % — ABNORMAL LOW (ref 36.0–46.0)
Hemoglobin: 12.1 g/dL (ref 12.0–15.0)
Immature Granulocytes: 0 %
Lymphocytes Relative: 37 %
Lymphs Abs: 2.8 10*3/uL (ref 0.7–4.0)
MCH: 32.8 pg (ref 26.0–34.0)
MCHC: 33.9 g/dL (ref 30.0–36.0)
MCV: 96.7 fL (ref 80.0–100.0)
Monocytes Absolute: 0.8 10*3/uL (ref 0.1–1.0)
Monocytes Relative: 10 %
Neutro Abs: 3.7 10*3/uL (ref 1.7–7.7)
Neutrophils Relative %: 50 %
Platelets: 309 10*3/uL (ref 150–400)
RBC: 3.69 MIL/uL — ABNORMAL LOW (ref 3.87–5.11)
RDW: 12.6 % (ref 11.5–15.5)
WBC: 7.5 10*3/uL (ref 4.0–10.5)
nRBC: 0 % (ref 0.0–0.2)

## 2022-05-10 LAB — COMPREHENSIVE METABOLIC PANEL
ALT: 46 U/L — ABNORMAL HIGH (ref 0–44)
AST: 34 U/L (ref 15–41)
Albumin: 3.6 g/dL (ref 3.5–5.0)
Alkaline Phosphatase: 88 U/L (ref 38–126)
Anion gap: 11 (ref 5–15)
BUN: 16 mg/dL (ref 8–23)
CO2: 23 mmol/L (ref 22–32)
Calcium: 8.7 mg/dL — ABNORMAL LOW (ref 8.9–10.3)
Chloride: 104 mmol/L (ref 98–111)
Creatinine, Ser: 0.52 mg/dL (ref 0.44–1.00)
GFR, Estimated: >60 mL/min (ref >60)
Glucose, Bld: 143 mg/dL — ABNORMAL HIGH (ref 70–99)
Potassium: 3.3 mmol/L — ABNORMAL LOW (ref 3.5–5.1)
Sodium: 138 mmol/L (ref 135–145)
Total Bilirubin: 0.5 mg/dL (ref 0.3–1.2)
Total Protein: 6.3 g/dL — ABNORMAL LOW (ref 6.5–8.1)

## 2022-05-10 LAB — MAGNESIUM: Magnesium: 1.9 mg/dL (ref 1.7–2.4)

## 2022-05-10 LAB — HEPATITIS PANEL, ACUTE
HCV Ab: NONREACTIVE
Hep A IgM: NONREACTIVE
Hep B C IgM: NONREACTIVE
Hepatitis B Surface Ag: NONREACTIVE

## 2022-05-10 LAB — PROTIME-INR
INR: 0.9 (ref 0.8–1.2)
Prothrombin Time: 12.5 seconds (ref 11.4–15.2)

## 2022-05-10 LAB — BLOOD GAS, VENOUS
Acid-Base Excess: 2.9 mmol/L — ABNORMAL HIGH (ref 0.0–2.0)
Bicarbonate: 27.9 mmol/L (ref 20.0–28.0)
O2 Saturation: 74.1 %
Patient temperature: 37.1
pCO2, Ven: 43 mmHg — ABNORMAL LOW (ref 44–60)
pH, Ven: 7.42 (ref 7.25–7.43)
pO2, Ven: 46 mmHg — ABNORMAL HIGH (ref 32–45)

## 2022-05-10 LAB — CK: Total CK: 74 U/L (ref 38–234)

## 2022-05-10 LAB — AMMONIA: Ammonia: 17 umol/L (ref 9–35)

## 2022-05-10 LAB — TSH: TSH: 1.453 u[IU]/mL (ref 0.350–4.500)

## 2022-05-10 MED ORDER — HYDROXYZINE HCL 25 MG PO TABS
25.0000 mg | ORAL_TABLET | Freq: Three times a day (TID) | ORAL | Status: DC | PRN
Start: 2022-05-10 — End: 2022-05-12
  Administered 2022-05-11 (×2): 25 mg via ORAL
  Filled 2022-05-10 (×3): qty 1

## 2022-05-10 MED ORDER — SERTRALINE HCL 25 MG PO TABS
25.0000 mg | ORAL_TABLET | Freq: Every day | ORAL | Status: DC
  Administered 2022-05-10 – 2022-05-12 (×2): 25 mg via ORAL
  Filled 2022-05-10 (×3): qty 1

## 2022-05-10 MED ORDER — OLANZAPINE 5 MG PO TABS
7.5000 mg | ORAL_TABLET | Freq: Every day | ORAL | Status: DC
  Administered 2022-05-10 – 2022-05-11 (×2): 7.5 mg via ORAL
  Filled 2022-05-10 (×2): qty 1

## 2022-05-10 MED ORDER — ALBUTEROL SULFATE (2.5 MG/3ML) 0.083% IN NEBU: 2.5000 mg | INHALATION_SOLUTION | RESPIRATORY_TRACT | Status: DC | PRN

## 2022-05-10 MED ORDER — ACETAMINOPHEN 325 MG PO TABS
650.0000 mg | ORAL_TABLET | Freq: Four times a day (QID) | ORAL | Status: DC | PRN
  Administered 2022-05-10 – 2022-05-12 (×4): 650 mg via ORAL
  Filled 2022-05-10 (×4): qty 2

## 2022-05-10 MED ORDER — AMLODIPINE BESYLATE 5 MG PO TABS
5.0000 mg | ORAL_TABLET | Freq: Every day | ORAL | Status: DC
  Administered 2022-05-10 – 2022-05-12 (×2): 5 mg via ORAL
  Filled 2022-05-10 (×3): qty 1

## 2022-05-10 MED ORDER — POTASSIUM CHLORIDE CRYS ER 20 MEQ PO TBCR
40.0000 meq | EXTENDED_RELEASE_TABLET | Freq: Two times a day (BID) | ORAL | Status: AC
  Administered 2022-05-10 (×2): 40 meq via ORAL
  Filled 2022-05-10 (×2): qty 2

## 2022-05-10 MED ORDER — LORAZEPAM 2 MG/ML IJ SOLN
0.5000 mg | Freq: Once | INTRAMUSCULAR | Status: AC
  Administered 2022-05-10: 0.5 mg via INTRAVENOUS
  Filled 2022-05-10: qty 1

## 2022-05-10 MED ORDER — ADULT MULTIVITAMIN W/MINERALS CH
1.0000 | ORAL_TABLET | Freq: Every day | ORAL | Status: DC
  Administered 2022-05-10 – 2022-05-12 (×2): 1 via ORAL
  Filled 2022-05-10 (×2): qty 1

## 2022-05-10 MED ORDER — LACTATED RINGERS IV SOLN
INTRAVENOUS | Status: AC
Start: 2022-05-10 — End: 2022-05-10

## 2022-05-10 MED ORDER — ONDANSETRON HCL 4 MG/2ML IJ SOLN
4.0000 mg | Freq: Once | INTRAMUSCULAR | Status: AC
  Administered 2022-05-10: 4 mg via INTRAVENOUS

## 2022-05-10 MED ORDER — ENSURE ENLIVE PO LIQD
237.0000 mL | Freq: Two times a day (BID) | ORAL | Status: DC
  Administered 2022-05-10: 237 mL via ORAL

## 2022-05-10 MED ORDER — ONDANSETRON HCL 4 MG/2ML IJ SOLN
INTRAMUSCULAR | Status: AC
  Filled 2022-05-10: qty 2

## 2022-05-10 MED ORDER — ACETAMINOPHEN 650 MG RE SUPP: 650.0000 mg | Freq: Four times a day (QID) | RECTAL | Status: DC | PRN
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
## 2022-05-10 NOTE — TOC Initial Note (Signed)
Transition of Care [facility]) - Initial/Assessment Note    Patient Details  Name: [name] MRN: [id number] Date of Birth: 1946-05-11  [facility] CM/SW Contact:    Vassie Moselle, LCSW Phone Number: 05/10/2022, 10:54 AM  Clinical Narrative:                 Met with pt who shares that she recently moved into [facility] last week in [facility] and will be returning to their facility at discharge. Pt states her son will provide transportation back to ALF. TOC will continue to follow for discharge needs.  Expected Discharge Plan: Assisted Living Barriers to Discharge: Continued Medical Work up   Patient Goals and CMS Choice Patient states their goals for this hospitalization and ongoing recovery are:: To return to [facility]   Choice offered to / list presented to : Patient  Expected Discharge Plan and Services Expected Discharge Plan: Assisted Living In-house Referral: NA Discharge Planning Services: CM Consult Post Acute Care Choice: NA Living arrangements for the past 2 months: [place], Single Family Home                 DME Arranged: N/A DME Agency: NA                  Prior Living Arrangements/Services Living arrangements for the past 2 months: [place] Lives with:: Self, Facility Resident Patient language and need for interpreter reviewed:: Yes Do you feel safe going back to the place where you live?: Yes      Need for Family Participation in Patient Care: Yes (Comment) Care giver support system in place?: Yes (comment) Current home services: DME Criminal Activity/Legal Involvement Pertinent to Current Situation/Hospitalization: No - Comment as needed  Activities of Daily Living Home Assistive Devices/Equipment: Eyeglasses ADL Screening (condition at time of admission) Patient's cognitive ability adequate to safely complete daily activities?: Yes Is the patient deaf or have difficulty  hearing?: No Does the patient have difficulty seeing, even when wearing glasses/contacts?: No Does the patient have difficulty concentrating, remembering, or making decisions?: No Patient able to express need for assistance with ADLs?: Yes Does the patient have difficulty dressing or bathing?: No Independently performs ADLs?: Yes (appropriate for developmental age) Does the patient have difficulty walking or climbing stairs?: No Weakness of Legs: None Weakness of Arms/Hands: None  Permission Sought/Granted Permission sought to share information with : Facility Sport and exercise psychologist, Family Supports Permission granted to share information with : Yes, Verbal Permission Granted  Share Information with NAME: [name]     Permission granted to share info w Relationship: Son  Permission granted to share info w Contact Information: [phone]  Emotional Assessment Appearance:: Appears stated age Attitude/Demeanor/Rapport: Engaged, Charismatic Affect (typically observed): Accepting, Pleasant Orientation: : Oriented to Self, Oriented to Place, Oriented to  Time, Oriented to Situation Alcohol / Substance Use: Not Applicable Psych Involvement: No (comment)  Admission diagnosis:  Overdose [T50.901A] Patient Active Problem List   Diagnosis Date Noted   Acute encephalopathy 05/10/2022   Dehydration 05/10/2022   Transaminitis 05/10/2022   Mild intermittent asthma 05/10/2022   Overdose 05/09/2022   GERD (gastroesophageal reflux disease)    Hypokalemia    Intractable nausea and vomiting 01/27/2021   Prediabetes 02/14/2020   Essential hypertension 08/19/2017   Hyperlipidemia 08/19/2017   Cough variant asthma 04/04/2016   Upper airway cough syndrome 04/04/2016   Osteoarthritis of left shoulder 03/24/2012   PCP:  Merri Ray  Alfonso Patten, MD Pharmacy:   [facility] [address] [facility] - [place], [place] - [place] [address] [place] [address] Phone:  [phone] Fax: [place], [place] - [place] [address] [place] [address] Phone: [phone] Fax: [phone]     Social Determinants of Health (SDOH) Interventions    Readmission Risk Interventions     No data to display
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
## 2022-05-10 NOTE — Progress Notes (Signed)
Care started prior to midnight in the emergency room and patient was admitted early this morning after midnight by Dr. Babs Bertin and I am in current agreement with his assessment and plan.  Additional changes to the plan of care have been made accordingly.  The patient is a 76 year old Caucasian female with a past medical history significant for but not limited to, essential hypertension, hyperlipidemia, history of tumor of the pancreas, GERD, peripheral vascular disease, history of traumatic pneumothorax and prediabetes as well as other comorbidities who presented to the hospital from her ALF for the evaluation of altered mental status.  She is recently transitioned to an assisted living facility last week and was started on a few new medications as an outpatient including gabapentin, Olanzapine, Zoloft, and trazodone.  Over the last 2 to 3 days staff the patient is facility noted the patient to be somewhat confused at her baseline mental status and alert to the patient's son and they perform pill counts and they noted fewer pills than anticipated based on her most recent fill dates related to her olanzapine, trazodone, and gabapentin.  Additionally the patient takes Tylenol as Alex patient as needed.  She does not recall taking any of her outpatient medications beyond what she was prescribed and she presented with altered mental status and confusion and encephalopathy to med [facility] and was then transferred to [facility] for further medical care.  She had basic blood work done in the ED as well as a head CT scan which showed no acute intracranial processes and showed no evidence of intracranial hemorrhage or evidence of acute infarct.  She also subsequently underwent a CT of the abdomen pelvis with contrast which showed decompression of the stomach with potential wall thickening, potentially indicating gastritis but otherwise had no evidence of acute intra-abdominal or intrapelvic processes.  In  the ED she was given Ativan 0.5 mg x 1, Zofran 4 mg x 2, famotidine IV as well as a 1 L normal saline bolus and she was admitted for further evaluation and management of her encephalopathy in the suspecting of unintentional overdose.  Currently she is being admitted and treated for the following but not limited to:  Acute toxic encephalopathy likely suspected to unintentional overdose -She is admitted to inpatient telemetry as an observation -Likely had suspected unintentional overdose based on her pill count of multiple outpatient medications including gabapentin, olanzapine, Zoloft, trazodone -Currently no infectious contribution likely at this time given that UA is inconsistent with UTI -Acute CVA as less likely given that she had no acute focal neurological deficits and CT of the head showed no acute intracranial processes -Possibly could have some combination from mild dehydration but she had no real overt metabolic contributions at this time -Serum ethanol level was less than 10 and urine drug screen was pan negative -Continue telemetry monitoring and she had a CPK check as well as a VBG -We will obtain PT OT to further evaluate and treat and continue to hold outpatient central acting medications for now and resume slowly -Chest x-ray was checked and showed "Heart and mediastinal contours are within normal limits. Old depressed right rib fractures with associated pleuroparenchymal scarring. No focal consolidation, pleural effusion, or  pneumothorax. Left shoulder arthroplasty. Degenerative arthritis right shoulder." -TSH level was 1.453, acetaminophen level was less than 10 and salicylate level was less than 7.0 -Ammonia level was 17 -Patient is improving and will place on delirium precautions -IV fluid hydration with lactated Ringer's at 75 MLS  per hour has now been stopped  Unintentional drug overdose -See above -Serum acetaminophen level was not elevated and poison control did not  recommend initiation of N-acetylcysteine at this time -Patient had no s suicidal ideation and has no recollection of taking more than her prescribed meds -Currently in no respiratory distress and will continue telemetry monitoring -Was given IV fluid hydration which is now stopped  Dehydration -See above -She had clinical suspicion on admission given that she had some dry oral mucous membranes which are now improved -She also had a mild acute prerenal azotemia -IV fluid hydration has been given and now stopped -Continue strict I's and O's and daily weights  Abnormal LFTs -Mild and improving -Patient's AST on admission was 40 and trended down to 34 -Patient's ALT was 55 and trended down to 46 -Acute hepatitis panel is negative -Continue to monitor and trend hepatic function panel and if necessary will obtain a right upper quadrant ultrasound -Repeat CMP in the a.m.  Mild intermittent asthma -Continue with as needed albuterol inhalers and nebulizers -Currently not in exacerbation  Gastritis/GERD/GI prophylaxis -No longer taking Pantoprazole 40 mg p.o. twice daily -Given a dose of IV famotidine once -We will start pantoprazole 40 mg daily  Hypokalemia -Patient's potassium went from 3.7 now 3.3 -Replete with p.o. KCl 40 mEq twice daily x2 doses -Mag level was 1.9 -Continue monitor and replete as necessary -Repeat CMP in the a.m.  Essential Hypertension -Resume home amlodipine 5 mg p.o. daily -Continue monitor blood pressures per protocol -Last blood pressure reading was 159/80  Hyperlipidemia -She is on a atorvastatin in outpatient setting but given her slightly elevated LFTs we will hold off for now and per further investigation she is no longer taking this -CK level was 74 -If LFTs improve she can go back on her statin if warranted in the outpatient setting  Anxiety -Start hydroxyzine 25 mg p.o. 3 times daily as needed  We will continue to monitor the patient's clinical  response to intervention and repeat blood work in the a.m. and follow-up on PT and OT evaluations

## 2022-05-10 NOTE — Progress Notes (Signed)
[name] -- [phone]  Contact information for poison control.  They are closing her out on their end as we are far enough out they expect no delayed reaction.

## 2022-05-10 NOTE — H&P (Addendum)
History and Physical    PLEASE NOTE THAT DRAGON DICTATION SOFTWARE WAS USED IN THE CONSTRUCTION OF THIS NOTE.   [name] WIO:[id number] DOB: 14-Aug-1946 DOA: 05/09/2022  PCP: [name], MD  Patient coming from: ALF  I have personally briefly reviewed patient's old medical records in [place]  Chief Complaint: Altered mental status  HPI: [name] is a 76 y.o. female with medical history significant for mild intermittent asthma, essential hypertension, hyperlipidemia, neuroendocrine tumor of the pancreas, who is admitted to [facility] on 05/09/2022 by way of transfer from [place] emergency department with acute encephalopathy after presenting from ALF to to the latter facility for evaluation of altered mental status.  The patient reportedly recently transition to an assisted living facility and was recently started on a few new medications as an outpatient, with potential for central acting implications, including gabapentin, scheduled Lanza pain, Zoloft, and as needed trazodone.  Over the last 2 3 days, staff at the patient's assisted living facility reportedly noted the patient to be somewhat confused relative to baseline mental status.  They subsequently alerted the patient's son to this change in mental status to performed pill counts on the patient's medications, noting that there were fewer pills than anticipated based upon most recent fill date as relates to her olanzapine, trazodone, gabapentin.  Additionally, the patient takes Tylenol as an outpatient, but exclusively on a as needed basis.  The patient does not recall taking any of her outpatient medications beyond that which is prescribed.  She also denies any suicidal ideations.  Currently denies any acute symptoms, including no chest pain, shortness of breath, palpitations, dizziness, presyncope, syncope.  No current nausea, vomiting, abdominal pain, diarrhea.  Denies any recent  subjective fever, chills, rigors, or generalized myalgias.  In the setting of the patient's altered mental status, she was brought to [place] emergency department for further evaluation and management thereof.     [facility] ED Course:  Vital signs in the ED were notable for the following: Afebrile; initial heart rate 110, which decreased to 88 following interval IV fluids, as further detailed below; blood pressure 151/80; respiratory rate 16-22, oxygen saturation 95 to 99% on room air.  Labs were notable for the following: CMP notable for the following: Sodium 137, bicarbonate 22, anion gap 10, creatinine 0.60 compared to most recent prior creatinine data point of 0.47 on 04/24/2022, BUN to creatinine ratio 26.7, glucose 126, AST 48 compared to 17 on 04/24/2022, ALT 55 compared to 17 on 04/24/2022, will alkaline phosphatase and total bilirubin were noted to be within normal limits.  Lipase 33.  CBC notable for white blood cell count 8600.  INR 0.9.  Serum ethanol less than 10, serum acetaminophen level less than 10, salicylate level less than 10.  UA was notable for the following: No white blood cells, leukocyte esterase/nitrate negative, specific gravity 1.015.  Urinary drug screen pan negative.  Imaging and additional notable ED work-up: EKG showed sinus rhythm with heart rate 88, normal intervals, including QTc of 442, and no evidence of T wave or ST changes, including no evidence of ST elevation.  CT head showed no evidence of acute intracranial process, including no evidence of intracranial hemorrhage or any evidence of acute infarct.  CT abdomen/pelvis with contrast showed decompression of the stomach with potential wall thickening, potentially indicating gastritis, but otherwise showed no evidence of acute intra-abdominal or acute intrapelvic process.  Of note,  case was discussed with poison control, who did not recommend initiation of N-acetylcysteine.  While in the ED,  the following were administered: Ativan 0.5 mg IV x1, Zofran 4 mg IV x2, Pepcid 20 mg IV x1, normal saline x1 L bolus.  Subsequently, the patient was admitted to [facility] long for further evaluation and management of acute encephalopathy in the setting of suspected unintentional overdose.     Review of Systems: As per HPI otherwise 10 point review of systems negative.   Past Medical History:  Diagnosis Date   Arthritis    Asthma    Cancer ([place])    Pancreas   Complication of anesthesia    GERD (gastroesophageal reflux disease)    at times   Hyperlipidemia    Hypertension    Peripheral vascular disease ([place])    has spider veins   Pneumonia    Pneumothorax, traumatic    RIGHT; associated with multiple rib fractures after a fall   PONV (postoperative nausea and vomiting)    Pre-diabetes     Past Surgical History:  Procedure Laterality Date   ABDOMINAL HYSTERECTOMY  1982   AUGMENTATION MAMMAPLASTY Bilateral    Patient has bilateral implants    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   BREAST SURGERY     ESOPHAGOGASTRODUODENOSCOPY (EGD) WITH PROPOFOL N/A 01/05/2020   Procedure: ESOPHAGOGASTRODUODENOSCOPY (EGD) WITH PROPOFOL;  Surgeon: Arta Silence, MD;  Location: [facility];  Service: Endoscopy;  Laterality: N/A;   EUS N/A 01/05/2020   Procedure: UPPER ENDOSCOPIC ULTRASOUND (EUS) LINEAR;  Surgeon: Arta Silence, MD;  Location: [facility];  Service: Endoscopy;  Laterality: N/A;   FINE NEEDLE ASPIRATION N/A 01/05/2020   Procedure: FINE NEEDLE ASPIRATION (FNA) LINEAR;  Surgeon: Arta Silence, MD;  Location: [facility];  Service: Endoscopy;  Laterality: N/A;   JOINT REPLACEMENT  2009   right knee   JOINT REPLACEMENT  2011   left knee   MASTECTOMY  2008   right breast   REDUCTION MAMMAPLASTY Left    Patient had lift with implant on Left side    SHOULDER ARTHROSCOPY DISTAL CLAVICLE EXCISION AND OPEN ROTATOR CUFF REPAIR  2011   TOTAL SHOULDER ARTHROPLASTY  03/14/2012    Procedure: TOTAL SHOULDER ARTHROPLASTY;  Surgeon: Alta Corning, MD;  Location: [facility];  Service: Orthopedics;  Laterality: Left;    Social History:  reports that she has never smoked. She has never used smokeless tobacco. She reports that she does not drink alcohol and does not use drugs.   Allergies  Allergen Reactions   Dilaudid [Hydromorphone Hcl] Itching   Morphine And Related Nausea And Vomiting    Family History  Problem Relation Age of Onset   Breast cancer Sister    Rheum arthritis Sister    Breast cancer Paternal Grandmother    Kidney Stones Mother    CVA Mother    Hypertension Father    Anesthesia problems Neg Hx    Hypotension Neg Hx    Malignant hyperthermia Neg Hx    Pseudochol deficiency Neg Hx     Family history reviewed and not pertinent    Prior to Admission medications   Medication Sig Start Date End Date Taking? Authorizing Provider  acetaminophen (TYLENOL) 500 MG tablet Take 1,000 mg by mouth daily as needed for mild pain or moderate pain.    [provider]  albuterol (VENTOLIN HFA) 108 (90 Base) MCG/ACT inhaler Inhale 1-2 puffs into the lungs every 6 (six) hours as needed for  wheezing or shortness of breath. 04/30/22   [name], MD  amLODipine (NORVASC) 5 MG tablet Take 1 tablet (5 mg total) by mouth daily. TAKE 1 TABLET(5 MG) BY MOUTH DAILY 04/30/22   [name], MD  atorvastatin (LIPITOR) 20 MG tablet Take 1 tablet (20 mg total) by mouth daily at 6 PM. 04/30/22   [name], MD  fluticasone-salmeterol (ADVAIR HFA) [phone] MCG/ACT inhaler Inhale 2 puffs into the lungs 2 (two) times daily. Patient not taking: Reported on 04/24/2022 05/29/21   [name], MD  gabapentin (NEURONTIN) 300 MG capsule Take 300 mg by mouth 3 (three) times daily. 04/18/22   [provider]  ibuprofen (ADVIL) 200 MG tablet Take 400 mg by mouth 2 (two) times daily as needed for pain.    [provider]  OLANZapine (ZYPREXA) 7.5 MG  tablet Take 7.5 mg by mouth at bedtime. 04/30/22   [provider]  ondansetron (ZOFRAN ODT) 4 MG disintegrating tablet Take 1 tablet (4 mg total) by mouth every 8 (eight) hours as needed for nausea or vomiting. Patient not taking: Reported on 04/24/2022 11/22/21   Davonna Belling, MD  pantoprazole (PROTONIX) 40 MG tablet Take 1 tablet (40 mg total) by mouth 2 (two) times daily. Patient not taking: Reported on 06/22/2021 04/15/21   Aline August, MD  sertraline (ZOLOFT) 25 MG tablet Take 25 mg by mouth daily. 04/30/22   [provider]  traZODone (DESYREL) 50 MG tablet Take 50 mg by mouth at bedtime as needed. 04/30/22   [provider]     Objective    Physical Exam: Vitals:   05/09/22 2154 05/09/22 2200 05/09/22 2307 05/10/22 0000  BP: (!) 154/83 (!) 169/91 (!) 152/78 (!) 151/80  Pulse: 93 94 88 83  Resp: (!) '22 14 16 18  '$ Temp:   98 F (36.7 C) 98.1 F (36.7 C)  TempSrc:   Oral Oral  SpO2: 98% 95% 96% 95%  Weight:      Height:        General: appears to be stated age; alert, confused Skin: warm, dry, no rash Head:  AT/[place] Mouth:  Oral mucosa membranes appear dry, normal dentition Neck: supple; trachea midline Heart:  RRR; did not appreciate any M/R/G Lungs: CTAB, did not appreciate any wheezes, rales, or rhonchi Abdomen: + BS; soft, ND, NT Vascular: 2+ pedal pulses b/l; 2+ radial pulses b/l Extremities: no peripheral edema, no muscle wasting Neuro: strength and sensation intact in upper and lower extremities b/l      Labs on Admission: I have personally reviewed following labs and imaging studies  CBC: Recent Labs  Lab 05/09/22 1640  WBC 8.6  HGB 13.4  HCT 39.1  MCV 95.6  PLT 025   Basic Metabolic Panel: Recent Labs  Lab 05/09/22 1640 05/09/22 1856  NA 137  --   K 3.7  --   CL 105  --   CO2 22  --   GLUCOSE 126*  --   BUN 16  --   CREATININE 0.60  --   CALCIUM 9.5  --   MG  --  2.0   GFR: Estimated Creatinine Clearance: 53.7  mL/min (by C-G formula based on SCr of 0.6 mg/dL). Liver Function Tests: Recent Labs  Lab 05/09/22 1640  AST 48*  ALT 55*  ALKPHOS 108  BILITOT 0.8  PROT 7.2  ALBUMIN 4.3   Recent Labs  Lab 05/09/22 1640  LIPASE 33   No results for  input(s): "AMMONIA" in the last 168 hours. Coagulation Profile: Recent Labs  Lab 05/09/22 1856  INR 0.9   Cardiac Enzymes: No results for input(s): "CKTOTAL", "CKMB", "CKMBINDEX", "TROPONINI" in the last 168 hours. BNP (last 3 results) Recent Labs    05/29/21 1226  PROBNP 222   HbA1C: No results for input(s): "HGBA1C" in the last 72 hours. CBG: No results for input(s): "GLUCAP" in the last 168 hours. Lipid Profile: No results for input(s): "CHOL", "HDL", "LDLCALC", "TRIG", "CHOLHDL", "LDLDIRECT" in the last 72 hours. Thyroid Function Tests: No results for input(s): "TSH", "T4TOTAL", "FREET4", "T3FREE", "THYROIDAB" in the last 72 hours. Anemia Panel: No results for input(s): "VITAMINB12", "FOLATE", "FERRITIN", "TIBC", "IRON", "RETICCTPCT" in the last 72 hours. Urine analysis:    Component Value Date/Time   COLORURINE YELLOW 05/09/2022 2136   APPEARANCEUR CLEAR 05/09/2022 2136   LABSPEC 1.015 05/09/2022 2136   PHURINE 6.5 05/09/2022 2136   GLUCOSEU NEGATIVE 05/09/2022 2136   HGBUR NEGATIVE 05/09/2022 2136   BILIRUBINUR NEGATIVE 05/09/2022 2136   BILIRUBINUR negative 12/31/2016 1145   KETONESUR NEGATIVE 05/09/2022 2136   PROTEINUR NEGATIVE 05/09/2022 2136   UROBILINOGEN 0.2 12/31/2016 1145   UROBILINOGEN 0.2 03/14/2012 0612   NITRITE NEGATIVE 05/09/2022 2136   LEUKOCYTESUR NEGATIVE 05/09/2022 2136    Radiological Exams on Admission: CT Head Wo Contrast  Result Date: 05/09/2022 CLINICAL DATA:  Mental status change, unknown cause. EXAM: CT HEAD WITHOUT CONTRAST TECHNIQUE: Contiguous axial images were obtained from the base of the skull through the vertex without intravenous contrast. RADIATION DOSE REDUCTION: This exam was  performed according to the departmental dose-optimization program which includes automated exposure control, adjustment of the mA and/or kV according to patient size and/or use of iterative reconstruction technique. COMPARISON:  CT head dated April 14, 2021 FINDINGS: Brain: No evidence of acute infarction, hemorrhage, hydrocephalus, extra-axial collection or mass lesion/mass effect. Mild cerebral atrophy and chronic microvascular ischemic changes of the white matter. Vascular: No hyperdense vessel or unexpected calcification. Skull: Normal. Negative for fracture or focal lesion. Sinuses/Orbits: No acute finding. Other: None. IMPRESSION: 1. No acute intracranial abnormality. 2. Mild cerebral atrophy and chronic microvascular ischemic changes of the white matter. Electronically Signed   By: Keane Police D.O.   On: 05/09/2022 22:18   CT Abdomen Pelvis W Contrast  Result Date: 05/09/2022 CLINICAL DATA:  Right lower quadrant abdominal pain. Elevated liver function studies. Nausea and abdominal pain. EXAM: CT ABDOMEN AND PELVIS WITH CONTRAST TECHNIQUE: Multidetector CT imaging of the abdomen and pelvis was performed using the standard protocol following bolus administration of intravenous contrast. RADIATION DOSE REDUCTION: This exam was performed according to the departmental dose-optimization program which includes automated exposure control, adjustment of the mA and/or kV according to patient size and/or use of iterative reconstruction technique. CONTRAST:  122m OMNIPAQUE IOHEXOL 300 MG/ML  SOLN COMPARISON:  10/23/2021 FINDINGS: Lower chest: Nodule in the right lung base measuring 9 mm diameter, possibly associated with scarring. This is unchanged since the prior study. Right breast implant. Hepatobiliary: No focal liver abnormality is seen. No gallstones, gallbladder wall thickening, or biliary dilatation. Pancreas: Hyperenhancing nodule in the body of the pancreas measuring 11 mm diameter. No change since prior  study. This corresponds with known neuroendocrine tumor. Spleen: Normal in size without focal abnormality. Adrenals/Urinary Tract: Adrenal glands are unremarkable. Kidneys are normal, without renal calculi, focal lesion, or hydronephrosis. Bladder is unremarkable. Stomach/Bowel: Stomach is decompressed. Despite decompression, the gastric wall appears mildly thickened with low-attenuation around the rim. This may indicate  gastritis. Small bowel and colon are decompressed but otherwise unremarkable. Appendix is normal. Vascular/Lymphatic: Aortic atherosclerosis. No enlarged abdominal or pelvic lymph nodes. Reproductive: Status post hysterectomy. No adnexal masses. Other: No abdominal wall hernia or abnormality. No abdominopelvic ascites. Musculoskeletal: Degenerative changes and scoliosis of the spine. No destructive bone lesions. IMPRESSION: 1. Decompression of the stomach with suggestion of wall thickening and peripheral low-attenuation possibly indicating gastritis. 2. Stable appearance of 9 mm nodule in the right lung base. 3. Stable appearance of 11 mm hyperenhancing nodule in the body of the pancreas consistent with known neuroendocrine tumor. 4. Aortic atherosclerosis. Electronically Signed   By: Lucienne Capers M.D.   On: 05/09/2022 19:49     EKG: Independently reviewed, with result as described above.    Assessment/Plan    Principal Problem:   Acute encephalopathy Active Problems:   Essential hypertension   Hyperlipidemia   Overdose   Dehydration   Transaminitis   Mild intermittent asthma       #) Acute encephalopathy: Suspected to be toxic in nature in the setting of suspected unintentional overdose, based on pill count, of multiple outpatient medications, including that of gabapentin, olanzapine, Zoloft, trazodone.  No evidence of underlying infectious contribution at this time, including UA that was inconsistent with UTI.  Will further expand infectious work-up, as further detailed  below.  Acute CVA also appears less likely, in the absence of any overt acute focal neurologic deficits, while CTh is no evidence of acute intracranial process.  Potential secondary contribution from mild dehydration, as further detailed below.  No additional overt metabolic contributions at this time, but will further expand laboratory evaluation for such, as further detailed below.  Of note, serum ethanol level less than 10, will urinary drug screen pan negative.  Plan: Further evaluation management of suspected unintentional overdose of multiple outpatient central acting meds, as further detailed below.  Add on CPK level, and check VBG.  Check chest x-ray, TSH.  Fall precautions ordered.  Repeat CMP and CBC in the morning.  Holding outpatient central acting medications for now. Check ammonia level.       #) Unintentional overdose: In the setting of altered mental status over the last few days, pill count reportedly demonstrated fewer than anticipated pills as relates to multiple of the patient's recently initiated central acting medications, as above.  Of note, serum acetaminophen level was nonelevated, and poison control did not recommend initiation of N-acetylcysteine at this time.  A potential associated overdose, does not appear to have been intentional, with the patient reporting no recollection of taking more than her prescribed medications, and denying any suicidal ideations.  At this time, and in considering potential specific medications that the patient may have unintentionally overdosed upon, there is no current evidence of QTc prolongation, neuroleptic malignant syndrome, or evidence of serotonin syndrome.  Patient appears to be protecting her airway, and in no acute respiratory distress, while also appearing to be hemodynamically stable.   Plan: Repeat EKG in the morning.  Monitor on telemetry.  Hold outpatient Pen[place]alli aspirin, Zoloft, trazodone.  Repeat INR in the morning.  Repeat CMP  in the morning.  Continuous lactated Ringer's at 75 cc/h x 10 hours.           #) Dehydration: Clinical suspicion for such, including the appearance of dry oral mucous membranes as well as laboratory findings notable for acute prerenal azotemia. Appears to be in the setting of   suspected recent decline in oral intake in the context  of recent development of altered mental status, as above.  No e/o associated hypotension.   Plan: Monitor strict I's and O's.  Daily weights.  Repeat CMP in the morning.  Continuous LR, as above.         #) Acute transaminitis, mildly elevated transaminases relative to corresponding labs drawn approximately 2 weeks ago, and in the absence of any cholestatic pattern, also noting no evidence of acute liver/acute gallbladder pathology on presenting CT abdomen/pelvis.  Mild interval elevation potentially to relative toxic implications of suspected unintentional overdose as relates to multiple outpatient medications, as above.  As above, nonelevated serum acetaminophen level, and no recommendations for N-acetylcysteine following review with poison control, as above.  Acute viral hepatitis panel ordered at [facility], with result currently pending.  Plan: Add on CPK level.  Follow-up result of acute viral hepatitis panel.  CMP and INR in the morning.  Holding home atorvastatin for now.         #) Mild intermittent asthma: Documented history of such, without clinical evidence to suggest acute exacerbation thereof.  On prn albuterol inhaler as an outpatient.  Plan: Prn albuterol nebulizer.          #) Essential Hypertension: documented h/o such, with outpatient antihypertensive regimen including amlodipine.  SBP's in the ED today: 140s to 150s mmHg.   Plan: Close monitoring of subsequent BP via routine VS. in setting of suspected presenting unintentional overdose, will hold home antihypertensive medications for  now.           #) Hyperlipidemia: documented h/o such. On atorvastatin. as outpatient.  In setting of presenting acute transaminitis, with CPK level currently, will hold home atorvastatin for now.   Plan: Hold home statin.  Follow-up result of CPK level.        DVT prophylaxis: SCD's   Code Status: Full code Family Communication: none Disposition Plan: Per Rounding Team Consults called: none;  Admission status: Observation   PLEASE NOTE THAT DRAGON DICTATION SOFTWARE WAS USED IN THE CONSTRUCTION OF THIS NOTE.   [place] DO [facility] [place]   05/10/2022, 12:16 AM

## 2022-05-11 ENCOUNTER — Telehealth: Payer: Self-pay | Admitting: Family Medicine

## 2022-05-11 DIAGNOSIS — E440 Moderate protein-calorie malnutrition: Secondary | ICD-10-CM | POA: Diagnosis not present

## 2022-05-11 DIAGNOSIS — J4520 Mild intermittent asthma, uncomplicated: Secondary | ICD-10-CM | POA: Diagnosis not present

## 2022-05-11 DIAGNOSIS — I10 Essential (primary) hypertension: Secondary | ICD-10-CM | POA: Diagnosis not present

## 2022-05-11 DIAGNOSIS — T50901A Poisoning by unspecified drugs, medicaments and biological substances, accidental (unintentional), initial encounter: Secondary | ICD-10-CM | POA: Diagnosis not present

## 2022-05-11 DIAGNOSIS — E860 Dehydration: Secondary | ICD-10-CM | POA: Diagnosis not present

## 2022-05-11 DIAGNOSIS — G9340 Encephalopathy, unspecified: Secondary | ICD-10-CM | POA: Diagnosis not present

## 2022-05-11 DIAGNOSIS — E785 Hyperlipidemia, unspecified: Secondary | ICD-10-CM | POA: Diagnosis not present

## 2022-05-11 LAB — CBC WITH DIFFERENTIAL/PLATELET
Abs Immature Granulocytes: 0.02 10*3/uL (ref 0.00–0.07)
Basophils Absolute: 0.1 10*3/uL (ref 0.0–0.1)
Basophils Relative: 1 %
Eosinophils Absolute: 0.2 10*3/uL (ref 0.0–0.5)
Eosinophils Relative: 3 %
HCT: 38.4 % (ref 36.0–46.0)
Hemoglobin: 12.5 g/dL (ref 12.0–15.0)
Immature Granulocytes: 0 %
Lymphocytes Relative: 38 %
Lymphs Abs: 2.4 10*3/uL (ref 0.7–4.0)
MCH: 32.5 pg (ref 26.0–34.0)
MCHC: 32.6 g/dL (ref 30.0–36.0)
MCV: 99.7 fL (ref 80.0–100.0)
Monocytes Absolute: 0.6 10*3/uL (ref 0.1–1.0)
Monocytes Relative: 10 %
Neutro Abs: 3 10*3/uL (ref 1.7–7.7)
Neutrophils Relative %: 48 %
Platelets: 334 10*3/uL (ref 150–400)
RBC: 3.85 MIL/uL — ABNORMAL LOW (ref 3.87–5.11)
RDW: 12.9 % (ref 11.5–15.5)
WBC: 6.3 10*3/uL (ref 4.0–10.5)
nRBC: 0 % (ref 0.0–0.2)

## 2022-05-11 LAB — COMPREHENSIVE METABOLIC PANEL
ALT: 36 U/L (ref 0–44)
AST: 25 U/L (ref 15–41)
Albumin: 3.8 g/dL (ref 3.5–5.0)
Alkaline Phosphatase: 92 U/L (ref 38–126)
Anion gap: 7 (ref 5–15)
BUN: 16 mg/dL (ref 8–23)
CO2: 25 mmol/L (ref 22–32)
Calcium: 9.3 mg/dL (ref 8.9–10.3)
Chloride: 107 mmol/L (ref 98–111)
Creatinine, Ser: 0.41 mg/dL — ABNORMAL LOW (ref 0.44–1.00)
GFR, Estimated: >60 mL/min (ref >60)
Glucose, Bld: 106 mg/dL — ABNORMAL HIGH (ref 70–99)
Potassium: 4.3 mmol/L (ref 3.5–5.1)
Sodium: 139 mmol/L (ref 135–145)
Total Bilirubin: 0.6 mg/dL (ref 0.3–1.2)
Total Protein: 6.6 g/dL (ref 6.5–8.1)

## 2022-05-11 LAB — PHOSPHORUS: Phosphorus: 4.4 mg/dL (ref 2.5–4.6)

## 2022-05-11 LAB — GLUCOSE, CAPILLARY: Glucose-Capillary: 133 mg/dL — ABNORMAL HIGH (ref 70–99)

## 2022-05-11 LAB — MAGNESIUM: Magnesium: 2.3 mg/dL (ref 1.7–2.4)

## 2022-05-11 MED ORDER — PANTOPRAZOLE SODIUM 40 MG PO TBEC: 40.0000 mg | DELAYED_RELEASE_TABLET | Freq: Two times a day (BID) | ORAL | 0 refills | Status: AC

## 2022-05-11 MED ORDER — LORAZEPAM 0.5 MG PO TABS
0.5000 mg | ORAL_TABLET | Freq: Four times a day (QID) | ORAL | Status: DC | PRN
  Administered 2022-05-11 – 2022-05-12 (×3): 0.5 mg via ORAL
  Filled 2022-05-11 (×3): qty 1

## 2022-05-11 MED ORDER — ADULT MULTIVITAMIN W/MINERALS CH: 1.0000 | ORAL_TABLET | Freq: Every day | ORAL | 0 refills | Status: DC

## 2022-05-11 MED ORDER — ENSURE ENLIVE PO LIQD: 237.0000 mL | Freq: Two times a day (BID) | ORAL | 0 refills | Status: DC

## 2022-05-11 MED ORDER — ACETAMINOPHEN 325 MG PO TABS: 650.0000 mg | ORAL_TABLET | Freq: Four times a day (QID) | ORAL | 0 refills | Status: DC | PRN

## 2022-05-11 MED ORDER — ONDANSETRON HCL 4 MG/2ML IJ SOLN
4.0000 mg | Freq: Four times a day (QID) | INTRAMUSCULAR | Status: DC | PRN
  Administered 2022-05-11: 4 mg via INTRAVENOUS
  Filled 2022-05-11: qty 2

## 2022-05-11 MED ORDER — HYDROXYZINE HCL 10 MG PO TABS: 25.0000 mg | ORAL_TABLET | Freq: Three times a day (TID) | ORAL | 0 refills | Status: DC | PRN

## 2022-05-11 MED ORDER — ONDANSETRON 4 MG PO TBDP: 4.0000 mg | ORAL_TABLET | Freq: Three times a day (TID) | ORAL | 0 refills | Status: AC | PRN

## 2022-05-11 NOTE — Progress Notes (Signed)
PT Cancellation Note  Patient Details Name: NADIE FIUMARA MRN: [id number] DOB: [birth year]   Cancelled Treatment:    Reason Eval/Treat Not Completed: PT screened, no needs identified, will sign off. Pt resting in bed upon arrival, states she is at baseline with ambulation and mobility, politely declines PT evaluation. Will sign off at this time, please re-consult if needs arise.    Talbot Grumbling PT, DPT 05/11/22, 9:34 AM

## 2022-05-11 NOTE — NC FL2 (Signed)
[place] LEVEL OF CARE SCREENING TOOL     IDENTIFICATION  Patient Name: [name] Birthdate: January 31, 1946 Sex: female Admission Date (Current Location): 05/09/2022  [facility] and [place] Number:  Herbalist and Address:  [facility],  [place], [place]      Provider Number: [id number]  Attending Physician Name and Address:  Kerney Elbe, DO  Relative Name and Phone Number:       Current Level of Care: Hospital Recommended Level of Care: Assisted Living Facility Prior Approval Number:    Date Approved/Denied:   PASRR Number:    Discharge Plan: Domiciliary (Rest home) [facility])    Current Diagnoses: Patient Active Problem List   Diagnosis Date Noted   Acute encephalopathy 05/10/2022   Dehydration 05/10/2022   Transaminitis 05/10/2022   Mild intermittent asthma 05/10/2022   Malnutrition of moderate degree 05/10/2022   Overdose 05/09/2022   GERD (gastroesophageal reflux disease)    Hypokalemia    Intractable nausea and vomiting 01/27/2021   Prediabetes 02/14/2020   Essential hypertension 08/19/2017   Hyperlipidemia 08/19/2017   Cough variant asthma 04/04/2016   Upper airway cough syndrome 04/04/2016   Osteoarthritis of left shoulder 03/24/2012    Orientation RESPIRATION BLADDER Height & Weight     Self, Time, Situation, Place  Normal Continent Weight: 125 lb 7.1 oz (56.9 kg) Height:  '5\' 5"'$  (165.1 cm)  BEHAVIORAL SYMPTOMS/MOOD NEUROLOGICAL BOWEL NUTRITION STATUS      Continent Diet (See dc summary)  AMBULATORY STATUS COMMUNICATION OF NEEDS Skin   Independent Verbally Normal                       Personal Care Assistance Level of Assistance  Bathing, Dressing, Feeding Bathing Assistance: Limited assistance Feeding assistance: Independent Dressing Assistance: Limited assistance     Functional Limitations Info  Sight, Hearing, Speech Sight Info: Impaired (glasses) Hearing  Info: Adequate Speech Info: Adequate    SPECIAL CARE FACTORS FREQUENCY                       Contractures Contractures Info: Not present    Additional Factors Info  Code Status, Allergies Code Status Info: Full Allergies Info: Dilaudid (Hydromorphone Hcl), Morphine And Related           Current Medications (05/11/2022):  This is the current hospital active medication list Current Facility-Administered Medications  Medication Dose Route Frequency Provider Last Rate Last Admin   acetaminophen (TYLENOL) tablet 650 mg  650 mg Oral Q6H PRN Howerter, Justin B, DO   650 mg at 05/10/22 0211   Or   acetaminophen (TYLENOL) suppository 650 mg  650 mg Rectal Q6H PRN Howerter, Justin B, DO       albuterol (PROVENTIL) (2.5 MG/3ML) 0.083% nebulizer solution 2.5 mg  2.5 mg Nebulization Q4H PRN Howerter, Justin B, DO       amLODipine (NORVASC) tablet 5 mg  5 mg Oral Daily Sheikh, Georgina Quint Latif, DO   5 mg at 05/10/22 1230   feeding supplement (ENSURE ENLIVE / ENSURE PLUS) liquid 237 mL  237 mL Oral BID BM SheikhGeorgina Quint Kennan, DO   237 mL at 05/10/22 0950   hydrOXYzine (ATARAX) tablet 25 mg  25 mg Oral TID PRN Raiford Noble Latif, DO   25 mg at 05/11/22 1054   LORazepam (ATIVAN) tablet 0.5 mg  0.5 mg Oral Q6H PRN Raiford Noble Latif, DO   0.5  mg at 05/11/22 1450   multivitamin with minerals tablet 1 tablet  1 tablet Oral Daily Raiford Noble Lavinia, DO   1 tablet at 05/10/22 1230   OLANZapine (ZYPREXA) tablet 7.5 mg  7.5 mg Oral QHS Raiford Noble [place], DO   7.5 mg at 05/10/22 2116   ondansetron [facility]) injection 4 mg  4 mg Intravenous Q6H PRN Raiford Noble Latif, DO   4 mg at 05/11/22 0820   sertraline (ZOLOFT) tablet 25 mg  25 mg Oral Daily Raiford Noble [place], DO   25 mg at 05/10/22 1230     Discharge Medications: Medication List       STOP taking these medications     gabapentin 300 MG capsule Commonly known as: NEURONTIN    ibuprofen 200 MG tablet Commonly known as: ADVIL     traZODone 50 MG tablet Commonly known as: DESYREL           TAKE these medications     acetaminophen 325 MG tablet Commonly known as: TYLENOL Take 2 tablets (650 mg total) by mouth every 6 (six) hours as needed for mild pain (or Fever >/= 101). What changed:  medication strength how much to take when to take this reasons to take this    Advair HFA 115-21 MCG/ACT inhaler Generic drug: fluticasone-salmeterol Inhale 2 puffs into the lungs 2 (two) times daily.    albuterol 108 (90 Base) MCG/ACT inhaler Commonly known as: VENTOLIN HFA Inhale 1-2 puffs into the lungs every 6 (six) hours as needed for wheezing or shortness of breath.    amLODipine 5 MG tablet Commonly known as: NORVASC Take 1 tablet (5 mg total) by mouth daily. TAKE 1 TABLET(5 MG) BY MOUTH DAILY    atorvastatin 20 MG tablet Commonly known as: LIPITOR Take 1 tablet (20 mg total) by mouth daily at 6 PM.    feeding supplement Liqd Take 237 mLs by mouth 2 (two) times daily between meals. Start taking on: May 12, 2022    hydrOXYzine 10 MG tablet Commonly known as: ATARAX Take 2.5 tablets (25 mg total) by mouth every 8 (eight) hours as needed for anxiety.    multivitamin with minerals Tabs tablet Take 1 tablet by mouth daily. Start taking on: May 12, 2022    OLANZapine 7.5 MG tablet Commonly known as: ZYPREXA Take 7.5 mg by mouth at bedtime.    ondansetron 4 MG disintegrating tablet Commonly known as: Zofran ODT Take 1 tablet (4 mg total) by mouth every 8 (eight) hours as needed for nausea or vomiting.    pantoprazole 40 MG tablet Commonly known as: PROTONIX Take 1 tablet (40 mg total) by mouth 2 (two) times daily.    sennosides-docusate sodium 8.6-50 MG tablet Commonly known as: SENOKOT-S Take 1 tablet by mouth daily.    sertraline 25 MG tablet Commonly known as: ZOLOFT Take 25 mg by mouth daily.       Relevant Imaging Results:  Relevant Lab Results:   Additional  Information [id number]  Servando Snare, LCSW

## 2022-05-11 NOTE — Plan of Care (Signed)
  Problem: Clinical Measurements: Goal: Will remain free from infection Outcome: Progressing   Problem: Clinical Measurements: Goal: Diagnostic test results will improve Outcome: Progressing   Problem: Nutrition: Goal: Adequate nutrition will be maintained Outcome: Progressing   Problem: Coping: Goal: Level of anxiety will decrease Outcome: Progressing   

## 2022-05-11 NOTE — Telephone Encounter (Signed)
Patient discharged form hospital today. Needs appt in 1 week with me for hospital follow up. Forms completed with med changes for [facility].

## 2022-05-11 NOTE — TOC Transition Note (Addendum)
Transition of Care [facility]) - CM/SW Discharge Note   Patient Details  Name: [name] MRN: [id number] Date of Birth: [birth year]  Transition of Care [facility]) CM/SW Contact:  Servando Snare, LCSW Phone Number: 05/11/2022, 3:29 PM   Clinical Narrative:   Patient is to return to [name]. Patient to be transported by family. Son's phone is off and granddaughter's phone going to voicemail. DC summary faxed to facility via hub. RN # for report: [phone]  Text message sent to granddaughter. No response.    Final next level of care: Assisted Living Barriers to Discharge: Other (must enter comment) (Unable to reach facility/family for transport.)   Patient Goals and CMS Choice Patient states their goals for this hospitalization and ongoing recovery are:: To return to [facility]   Choice offered to / list presented to : Patient  Discharge Placement              Patient chooses bed at:  (Return to [place]) Patient to be transferred to facility by: family      Discharge Plan and Services In-house Referral: NA Discharge Planning Services: CM Consult Post Acute Care Choice: NA          DME Arranged: N/A DME Agency: NA                  Social Determinants of Health (SDOH) Interventions     Readmission Risk Interventions     No data to display

## 2022-05-11 NOTE — Discharge Summary (Signed)
Physician Discharge Summary  [name] MLY:[id number] DOB: 11-Sep-1946 DOA: 05/09/2022  PCP: [name], MD  Admit date: 05/09/2022 Discharge date: 05/11/2022  Admitted From: ALF Disposition:  ALF  Recommendations for Outpatient Follow-up:  Follow up with PCP in 1-2 weeks  Please obtain CMP/CBC, Mag, Phos in one week Please follow up on the following pending results:  Home Health: No  Equipment/Devices: None    Discharge Condition: Stable   CODE STATUS: FULL CODE   Diet recommendation: Regular Diet   Brief/Interim Summary: The patient is a 76 year old Caucasian female with a past medical history significant for but not limited to, essential hypertension, hyperlipidemia, history of tumor of the pancreas, GERD, peripheral vascular disease, history of traumatic pneumothorax and prediabetes as well as other comorbidities who presented to the hospital from her ALF for the evaluation of altered mental status.  She is recently transitioned to an assisted living facility last week and was started on a few new medications as an outpatient including gabapentin, Olanzapine, Zoloft, and trazodone.  Over the last 2 to 3 days staff the patient is facility noted the patient to be somewhat confused at her baseline mental status and alert to the patient's son and they perform pill counts and they noted fewer pills than anticipated based on her most recent fill dates related to her olanzapine, trazodone, and gabapentin.  Additionally the patient takes Tylenol as Alex patient as needed.  She does not recall taking any of her outpatient medications beyond what she was prescribed and she presented with altered mental status and confusion and encephalopathy to med [facility] and was then transferred to [facility] for further medical care.  She had basic blood work done in the ED as well as a head CT scan which showed no acute intracranial processes and showed no evidence of intracranial hemorrhage  or evidence of acute infarct.  She also subsequently underwent a CT of the abdomen pelvis with contrast which showed decompression of the stomach with potential wall thickening, potentially indicating gastritis but otherwise had no evidence of acute intra-abdominal or intrapelvic processes.  In the ED she was given Ativan 0.5 mg x 1, Zofran 4 mg x 2, famotidine IV as well as a 1 L normal saline bolus and she was admitted for further evaluation and management of her encephalopathy in the suspecting of unintentional overdose.   She improved her mental status and she is back to baseline.  Continues to have some slight nausea but was given antiemetics with good control.  She is tolerating her diet without issues.  She is medically stable for discharge with close PCP follow-up in outpatient setting.  Discharge Diagnoses:  Principal Problem:   Acute encephalopathy Active Problems:   Essential hypertension   Hyperlipidemia   Overdose   Dehydration   Transaminitis   Mild intermittent asthma   Malnutrition of moderate degree  Acute toxic encephalopathy likely suspected to unintentional overdose, improved significantly -She is admitted to inpatient telemetry as an observation -Likely had suspected unintentional overdose based on her pill count of multiple outpatient medications including gabapentin, olanzapine, Zoloft, trazodone -Currently no infectious contribution likely at this time given that UA is inconsistent with UTI -Acute CVA as less likely given that she had no acute focal neurological deficits and CT of the head showed no acute intracranial processes -Possibly could have some combination from mild dehydration but she had no real overt metabolic contributions at this time -Serum ethanol level was less than 10 and  urine drug screen was pan negative -Continue telemetry monitoring and she had a CPK check as well as a VBG -We will obtain PT OT to further evaluate and treat and continue to hold  outpatient central acting medications for now and resume slowly -Chest x-ray was checked and showed "Heart and mediastinal contours are within normal limits. Old depressed right rib fractures with associated pleuroparenchymal scarring. No focal consolidation, pleural effusion, or  pneumothorax. Left shoulder arthroplasty. Degenerative arthritis right shoulder." -TSH level was 1.453, acetaminophen level was less than 10 and salicylate level was less than 7.0 -Ammonia level was 17 -Patient is improving and will place on delirium precautions -IV fluid hydration with lactated Ringer's at 75 MLS per hour has now been stopped -Patient is back to her baseline and improved significantly   Unintentional drug overdose -See above -Serum acetaminophen level was not elevated and poison control did not recommend initiation of N-acetylcysteine at this time -Patient had no s suicidal ideation and has no recollection of taking more than her prescribed meds -Currently in no respiratory distress and will continue telemetry monitoring -Was given IV fluid hydration which is now stopped -We will need her ALF to manage her medications at discharge   Dehydration -See above -She had clinical suspicion on admission given that she had some dry oral mucous membranes which are now improved -She also had a mild acute prerenal azotemia which is improved -IV fluid hydration has been given and now stopped -Continue strict I's and O's and daily weights   Abnormal LFTs -Mild and improving -Patient's AST on admission was 40 and trended down to 34 yesterday and today is now 25 -Patient's ALT was 55 and trended down to 46 yesterday and today is now 36 -Acute hepatitis panel is negative -Continue to monitor and trend hepatic function panel and if necessary will obtain a right upper quadrant ultrasound -Repeat CMP within 1 week   Mild intermittent asthma -Continue with as needed albuterol inhalers and nebulizers -Currently  not in exacerbation   Gastritis/GERD/GI prophylaxis -No longer taking Pantoprazole 40 mg p.o. twice daily -Given a dose of IV famotidine once -We will start pantoprazole 40 mg daily   Hypokalemia -Patient's potassium is now 4.3 -Mag level is now 2.3 -Continue monitor and replete as necessary -Repeat CMP in the a.m.   Essential Hypertension -Resume home amlodipine 5 mg p.o. daily -Continue monitor blood pressures per protocol -Last blood pressure reading was 131/85   Hyperlipidemia -She is on a atorvastatin in outpatient setting but given her slightly elevated LFTs we will hold off for now and per further investigation she is no longer taking this -CK level was 74 -Can resume statin at discharge   Anxiety -Start hydroxyzine 25 mg p.o. 3 times daily as needed -Follow-up with PCP in outpatient setting possibly psychiatry if significantly worse   Discharge Instructions  Discharge Instructions     Call MD for:  difficulty breathing, headache or visual disturbances   Complete by: As directed    Call MD for:  extreme fatigue   Complete by: As directed    Call MD for:  hives   Complete by: As directed    Call MD for:  persistant dizziness or light-headedness   Complete by: As directed    Call MD for:  persistant nausea and vomiting   Complete by: As directed    Call MD for:  redness, tenderness, or signs of infection (pain, swelling, redness, odor or green/yellow discharge around incision site)  Complete by: As directed    Call MD for:  severe uncontrolled pain   Complete by: As directed    Call MD for:  temperature >100.4   Complete by: As directed    Diet - low sodium heart healthy   Complete by: As directed    Discharge instructions   Complete by: As directed    You were cared for by a hospitalist during your hospital stay. If you have any questions about your discharge medications or the care you received while you were in the hospital after you are discharged, you can  call the unit and ask to speak with the hospitalist on call if the hospitalist that took care of you is not available. Once you are discharged, your primary care physician will handle any further medical issues. Please note that NO REFILLS for any discharge medications will be authorized once you are discharged, as it is imperative that you return to your primary care physician (or establish a relationship with a primary care physician if you do not have one) for your aftercare needs so that they can reassess your need for medications and monitor your lab values.  Follow up with PCP within 1-2 weeks and with Psychiatry in the outpatient as necessary. Take all medications as prescribed. If symptoms change or worsen please return to the ED for evaluation   Increase activity slowly   Complete by: As directed       Allergies as of 05/11/2022       Reactions   Dilaudid [hydromorphone Hcl] Itching   Morphine And Related Nausea And Vomiting        Medication List     STOP taking these medications    gabapentin 300 MG capsule Commonly known as: NEURONTIN   ibuprofen 200 MG tablet Commonly known as: ADVIL   traZODone 50 MG tablet Commonly known as: DESYREL       TAKE these medications    acetaminophen 325 MG tablet Commonly known as: TYLENOL Take 2 tablets (650 mg total) by mouth every 6 (six) hours as needed for mild pain (or Fever >/= 101). What changed:  medication strength how much to take when to take this reasons to take this   Advair HFA 115-21 MCG/ACT inhaler Generic drug: fluticasone-salmeterol Inhale 2 puffs into the lungs 2 (two) times daily.   albuterol 108 (90 Base) MCG/ACT inhaler Commonly known as: VENTOLIN HFA Inhale 1-2 puffs into the lungs every 6 (six) hours as needed for wheezing or shortness of breath.   amLODipine 5 MG tablet Commonly known as: NORVASC Take 1 tablet (5 mg total) by mouth daily. TAKE 1 TABLET(5 MG) BY MOUTH DAILY   atorvastatin 20  MG tablet Commonly known as: LIPITOR Take 1 tablet (20 mg total) by mouth daily at 6 PM.   feeding supplement Liqd Take 237 mLs by mouth 2 (two) times daily between meals. Start taking on: May 12, 2022   hydrOXYzine 10 MG tablet Commonly known as: ATARAX Take 2.5 tablets (25 mg total) by mouth every 8 (eight) hours as needed for anxiety.   multivitamin with minerals Tabs tablet Take 1 tablet by mouth daily. Start taking on: May 12, 2022   OLANZapine 7.5 MG tablet Commonly known as: ZYPREXA Take 7.5 mg by mouth at bedtime.   ondansetron 4 MG disintegrating tablet Commonly known as: Zofran ODT Take 1 tablet (4 mg total) by mouth every 8 (eight) hours as needed for nausea or vomiting.   pantoprazole 40 MG  tablet Commonly known as: PROTONIX Take 1 tablet (40 mg total) by mouth 2 (two) times daily.   sennosides-docusate sodium 8.6-50 MG tablet Commonly known as: SENOKOT-S Take 1 tablet by mouth daily.   sertraline 25 MG tablet Commonly known as: ZOLOFT Take 25 mg by mouth daily.        Allergies  Allergen Reactions   Dilaudid [Hydromorphone Hcl] Itching   Morphine And Related Nausea And Vomiting    Consultations: None  Procedures/Studies: DG Chest Port 1 View  Result Date: 05/10/2022 CLINICAL DATA:  Acute encephalopathy EXAM: PORTABLE CHEST 1 VIEW COMPARISON:  Radiographs 12/29/2021 FINDINGS: Heart and mediastinal contours are within normal limits. Old depressed right rib fractures with associated pleuroparenchymal scarring. No focal consolidation, pleural effusion, or pneumothorax. Left shoulder arthroplasty. Degenerative arthritis right shoulder. IMPRESSION: No active disease or change since 12/29/2021. Electronically Signed   By: Placido Sou M.D.   On: 05/10/2022 08:36   CT Head Wo Contrast  Result Date: 05/09/2022 CLINICAL DATA:  Mental status change, unknown cause. EXAM: CT HEAD WITHOUT CONTRAST TECHNIQUE: Contiguous axial images were obtained from the  base of the skull through the vertex without intravenous contrast. RADIATION DOSE REDUCTION: This exam was performed according to the departmental dose-optimization program which includes automated exposure control, adjustment of the mA and/or kV according to patient size and/or use of iterative reconstruction technique. COMPARISON:  CT head dated April 14, 2021 FINDINGS: Brain: No evidence of acute infarction, hemorrhage, hydrocephalus, extra-axial collection or mass lesion/mass effect. Mild cerebral atrophy and chronic microvascular ischemic changes of the white matter. Vascular: No hyperdense vessel or unexpected calcification. Skull: Normal. Negative for fracture or focal lesion. Sinuses/Orbits: No acute finding. Other: None. IMPRESSION: 1. No acute intracranial abnormality. 2. Mild cerebral atrophy and chronic microvascular ischemic changes of the white matter. Electronically Signed   By: Keane Police D.O.   On: 05/09/2022 22:18   CT Abdomen Pelvis W Contrast  Result Date: 05/09/2022 CLINICAL DATA:  Right lower quadrant abdominal pain. Elevated liver function studies. Nausea and abdominal pain. EXAM: CT ABDOMEN AND PELVIS WITH CONTRAST TECHNIQUE: Multidetector CT imaging of the abdomen and pelvis was performed using the standard protocol following bolus administration of intravenous contrast. RADIATION DOSE REDUCTION: This exam was performed according to the departmental dose-optimization program which includes automated exposure control, adjustment of the mA and/or kV according to patient size and/or use of iterative reconstruction technique. CONTRAST:  161m OMNIPAQUE IOHEXOL 300 MG/ML  SOLN COMPARISON:  10/23/2021 FINDINGS: Lower chest: Nodule in the right lung base measuring 9 mm diameter, possibly associated with scarring. This is unchanged since the prior study. Right breast implant. Hepatobiliary: No focal liver abnormality is seen. No gallstones, gallbladder wall thickening, or biliary dilatation.  Pancreas: Hyperenhancing nodule in the body of the pancreas measuring 11 mm diameter. No change since prior study. This corresponds with known neuroendocrine tumor. Spleen: Normal in size without focal abnormality. Adrenals/Urinary Tract: Adrenal glands are unremarkable. Kidneys are normal, without renal calculi, focal lesion, or hydronephrosis. Bladder is unremarkable. Stomach/Bowel: Stomach is decompressed. Despite decompression, the gastric wall appears mildly thickened with low-attenuation around the rim. This may indicate gastritis. Small bowel and colon are decompressed but otherwise unremarkable. Appendix is normal. Vascular/Lymphatic: Aortic atherosclerosis. No enlarged abdominal or pelvic lymph nodes. Reproductive: Status post hysterectomy. No adnexal masses. Other: No abdominal wall hernia or abnormality. No abdominopelvic ascites. Musculoskeletal: Degenerative changes and scoliosis of the spine. No destructive bone lesions. IMPRESSION: 1. Decompression of the stomach with suggestion of wall  thickening and peripheral low-attenuation possibly indicating gastritis. 2. Stable appearance of 9 mm nodule in the right lung base. 3. Stable appearance of 11 mm hyperenhancing nodule in the body of the pancreas consistent with known neuroendocrine tumor. 4. Aortic atherosclerosis. Electronically Signed   By: Lucienne Capers M.D.   On: 05/09/2022 19:49   DG Hand Complete Right  Result Date: 04/23/2022 CLINICAL DATA:  Hand pain EXAM: RIGHT HAND - COMPLETE 3+ VIEW COMPARISON:  None Available. FINDINGS: Joint space narrowing within the MCP joints. Mild degenerative changes at the 1st carpometacarpal joint. No acute bony abnormality. Specifically, no fracture, subluxation, or dislocation. Soft tissues are intact. IMPRESSION: No acute bony abnormality. Electronically Signed   By: Rolm Baptise M.D.   On: 04/23/2022 19:10     Subjective: Seen and examined at bedside this morning she states that she is doing a bit  better.  She had some nausea earlier but this is relieved with Zofran.  No chest pain or shortness of breath.  She is awake and alert and oriented.  No other concerns or plaints this time.  Nursing states that she started sobbing and had very high anxiety so she was given lorazepam with improvement.  She will need follow-up with PCP and psychiatry in outpatient setting given that she is medically stable.  Discharge Exam: Vitals:   05/11/22 0411 05/11/22 1123  BP: (!) 141/84 131/85  Pulse: 75 85  Resp: 20   Temp: 97.7 F (36.5 C) 98 F (36.7 C)  SpO2: 98% 96%   Vitals:   05/10/22 2133 05/11/22 0411 05/11/22 0500 05/11/22 1123  BP: (!) 161/86 (!) 141/84  131/85  Pulse: 81 75  85  Resp: 18 20    Temp: 98.1 F (36.7 C) 97.7 F (36.5 C)  98 F (36.7 C)  TempSrc:  Oral  Oral  SpO2: 98% 98%  96%  Weight:   56.9 kg   Height:       General: Pt is alert, awake, not in acute distress and she is resting Cardiovascular: RRR, S1/S2 +, no rubs, no gallops Respiratory: Diminished bilaterally, no wheezing, no rhonchi Abdominal: Soft, NT, ND, bowel sounds + Extremities: no edema, no cyanosis  The results of significant diagnostics from this hospitalization (including imaging, microbiology, ancillary and laboratory) are listed below for reference.    Microbiology: No results found for this or any previous visit (from the past 240 hour(s)).   Labs: BNP (last 3 results) Recent Labs    10/07/21 1500  BNP 20.2   Basic Metabolic Panel: Recent Labs  Lab 05/09/22 1640 05/09/22 1856 05/10/22 0104 05/11/22 0513  NA 137  --  138 139  K 3.7  --  3.3* 4.3  CL 105  --  104 107  CO2 22  --  23 25  GLUCOSE 126*  --  143* 106*  BUN 16  --  16 16  CREATININE 0.60  --  0.52 0.41*  CALCIUM 9.5  --  8.7* 9.3  MG  --  2.0 1.9 2.3  PHOS  --   --   --  4.4   Liver Function Tests: Recent Labs  Lab 05/09/22 1640 05/10/22 0104 05/11/22 0513  AST 48* 34 25  ALT 55* 46* 36  ALKPHOS 108 88 92   BILITOT 0.8 0.5 0.6  PROT 7.2 6.3* 6.6  ALBUMIN 4.3 3.6 3.8   Recent Labs  Lab 05/09/22 1640  LIPASE 33   Recent Labs  Lab 05/10/22 0547  AMMONIA  17   CBC: Recent Labs  Lab 05/09/22 1640 05/10/22 0104 05/11/22 0513  WBC 8.6 7.5 6.3  NEUTROABS  --  3.7 3.0  HGB 13.4 12.1 12.5  HCT 39.1 35.7* 38.4  MCV 95.6 96.7 99.7  PLT 375 309 334   Cardiac Enzymes: Recent Labs  Lab 05/10/22 0104  CKTOTAL 74   BNP: Invalid input(s): "POCBNP" CBG: No results for input(s): "GLUCAP" in the last 168 hours. D-Dimer No results for input(s): "DDIMER" in the last 72 hours. Hgb A1c No results for input(s): "HGBA1C" in the last 72 hours. Lipid Profile No results for input(s): "CHOL", "HDL", "LDLCALC", "TRIG", "CHOLHDL", "LDLDIRECT" in the last 72 hours. Thyroid function studies Recent Labs    05/10/22 0547  TSH 1.453   Anemia work up No results for input(s): "VITAMINB12", "FOLATE", "FERRITIN", "TIBC", "IRON", "RETICCTPCT" in the last 72 hours. Urinalysis    Component Value Date/Time   COLORURINE YELLOW 05/09/2022 2136   APPEARANCEUR CLEAR 05/09/2022 2136   LABSPEC 1.015 05/09/2022 2136   PHURINE 6.5 05/09/2022 2136   GLUCOSEU NEGATIVE 05/09/2022 2136   HGBUR NEGATIVE 05/09/2022 2136   BILIRUBINUR NEGATIVE 05/09/2022 2136   BILIRUBINUR negative 12/31/2016 1145   KETONESUR NEGATIVE 05/09/2022 2136   PROTEINUR NEGATIVE 05/09/2022 2136   UROBILINOGEN 0.2 12/31/2016 1145   UROBILINOGEN 0.2 03/14/2012 0612   NITRITE NEGATIVE 05/09/2022 2136   LEUKOCYTESUR NEGATIVE 05/09/2022 2136   Sepsis Labs Recent Labs  Lab 05/09/22 1640 05/10/22 0104 05/11/22 0513  WBC 8.6 7.5 6.3   Microbiology No results found for this or any previous visit (from the past 240 hour(s)).  Time coordinating discharge: Over 35 minutes  SIGNED:  Raiford Noble, DO [facility] 05/11/2022, 3:18 PM Pager is on [place]  If 7PM-7AM, please contact night-coverage [email or web address]

## 2022-05-12 ENCOUNTER — Encounter (HOSPITAL_COMMUNITY): Payer: Self-pay | Admitting: Family Medicine

## 2022-05-12 DIAGNOSIS — R1030 Lower abdominal pain, unspecified: Secondary | ICD-10-CM

## 2022-05-12 DIAGNOSIS — F05 Delirium due to known physiological condition: Secondary | ICD-10-CM

## 2022-05-12 DIAGNOSIS — T50901A Poisoning by unspecified drugs, medicaments and biological substances, accidental (unintentional), initial encounter: Secondary | ICD-10-CM | POA: Diagnosis not present

## 2022-05-12 MED ORDER — ENSURE ENLIVE PO LIQD: 237.0000 mL | Freq: Two times a day (BID) | ORAL | 0 refills | Status: AC

## 2022-05-12 MED ORDER — ACETAMINOPHEN 325 MG PO TABS: 650.0000 mg | ORAL_TABLET | Freq: Four times a day (QID) | ORAL | 0 refills | Status: AC | PRN

## 2022-05-12 MED ORDER — HYDROXYZINE HCL 10 MG PO TABS: 25.0000 mg | ORAL_TABLET | Freq: Three times a day (TID) | ORAL | 0 refills | Status: AC | PRN

## 2022-05-12 MED ORDER — ADULT MULTIVITAMIN W/MINERALS CH
1.0000 | ORAL_TABLET | Freq: Every day | ORAL | 0 refills | Status: AC
Start: 2022-05-12 — End: 2022-06-11

## 2022-05-12 NOTE — TOC Transition Note (Signed)
Transition of Care [facility]) - CM/SW Discharge Note   Patient Details  Name: [name] MRN: [id number] Date of Birth: 1946/09/15  [facility] CM/SW Contact:  Illene Regulus, LCSW Phone Number: 05/12/2022, 10:01 AM   Clinical Narrative:    CSW spoke with pt's son Nancy Nordmann ([phone]) who reported being out of town. Pt's son stated pt's granddaughter will be picking pt up around 3:30pm when she gets off work.    Final next level of care: Assisted Living Barriers to Discharge: Other (must enter comment) (Unable to reach facility/family for transport.)   Patient Goals and CMS Choice Patient states their goals for this hospitalization and ongoing recovery are:: To return to [facility]   Choice offered to / list presented to : Patient  Discharge Placement              Patient chooses bed at:  (Return to [facility]) Patient to be transferred to facility by: family      Discharge Plan and Services In-house Referral: NA Discharge Planning Services: CM Consult Post Acute Care Choice: NA          DME Arranged: N/A DME Agency: NA                  Social Determinants of Health (SDOH) Interventions     Readmission Risk Interventions     No data to display

## 2022-05-12 NOTE — Discharge Summary (Signed)
Physician Discharge Summary  [name] ZYS:[id number] DOB: [birth year] DOA: 05/09/2022  PCP: [name], MD  Admit date: 05/09/2022 Discharge date: 05/12/2022  Admitted From: ALF Disposition:  ALF  Recommendations for Outpatient Follow-up:  Follow up with PCP in 1-2 weeks  Please obtain CMP/CBC, Mag, Phos in one week Please follow up on the following pending results:  Home Health: No  Equipment/Devices: None    Discharge Condition: Stable   CODE STATUS: FULL CODE   Diet recommendation: Regular Diet   Brief/Interim Summary: The patient is a 76 year old Caucasian female with a past medical history significant for but not limited to, essential hypertension, hyperlipidemia, history of tumor of the pancreas, GERD, peripheral vascular disease, history of traumatic pneumothorax and prediabetes as well as other comorbidities who presented to the hospital from her ALF for the evaluation of altered mental status.  She is recently transitioned to an assisted living facility last week and was started on a few new medications as an outpatient including gabapentin, Olanzapine, Zoloft, and trazodone.  Over the last 2 to 3 days staff the patient is facility noted the patient to be somewhat confused at her baseline mental status and alert to the patient's son and they perform pill counts and they noted fewer pills than anticipated based on her most recent fill dates related to her olanzapine, trazodone, and gabapentin.  Additionally the patient takes Tylenol as Alex patient as needed.  She does not recall taking any of her outpatient medications beyond what she was prescribed and she presented with altered mental status and confusion and encephalopathy to med [facility] and was then transferred to [facility] for further medical care.  She had basic blood work done in the ED as well as a head CT scan which showed no acute intracranial processes and showed no evidence of intracranial hemorrhage  or evidence of acute infarct.  She also subsequently underwent a CT of the abdomen pelvis with contrast which showed decompression of the stomach with potential wall thickening, potentially indicating gastritis but otherwise had no evidence of acute intra-abdominal or intrapelvic processes.  In the ED she was given Ativan 0.5 mg x 1, Zofran 4 mg x 2, famotidine IV as well as a 1 L normal saline bolus and she was admitted for further evaluation and management of her encephalopathy in the suspecting of unintentional overdose.   She improved her mental status and she is back to baseline.  Continues to have some slight nausea but was given antiemetics with good control.  She is tolerating her diet without issues.  She is medically stable for discharge with close PCP follow-up in outpatient setting.  ADDENDUM 05/12/22: Seen and examined and patient did not end up going to her SNF yesterday because he could not call for report.  She remains medically stable for discharge today.  No acute changes overnight.  Discharge Diagnoses:  Principal Problem:   Acute encephalopathy Active Problems:   Essential hypertension   Hyperlipidemia   Overdose   Dehydration   Transaminitis   Mild intermittent asthma   Malnutrition of moderate degree  Acute toxic encephalopathy likely suspected to unintentional overdose, improved significantly -She is admitted to inpatient telemetry as an observation -Likely had suspected unintentional overdose based on her pill count of multiple outpatient medications including gabapentin, olanzapine, Zoloft, trazodone -Currently no infectious contribution likely at this time given that UA is inconsistent with UTI -Acute CVA as less likely given that she had no acute focal neurological deficits  and CT of the head showed no acute intracranial processes -Possibly could have some combination from mild dehydration but she had no real overt metabolic contributions at this time -Serum ethanol  level was less than 10 and urine drug screen was pan negative -Continue telemetry monitoring and she had a CPK check as well as a VBG -We will obtain PT OT to further evaluate and treat and continue to hold outpatient central acting medications for now and resume slowly -Chest x-ray was checked and showed "Heart and mediastinal contours are within normal limits. Old depressed right rib fractures with associated pleuroparenchymal scarring. No focal consolidation, pleural effusion, or  pneumothorax. Left shoulder arthroplasty. Degenerative arthritis right shoulder." -TSH level was 1.453, acetaminophen level was less than 10 and salicylate level was less than 7.0 -Ammonia level was 17 -Patient is improving and will place on delirium precautions -IV fluid hydration with lactated Ringer's at 75 MLS per hour has now been stopped -Patient is back to her baseline and improved significantly   Unintentional drug overdose -See above -Serum acetaminophen level was not elevated and poison control did not recommend initiation of N-acetylcysteine at this time -Patient had no s suicidal ideation and has no recollection of taking more than her prescribed meds -Currently in no respiratory distress and will continue telemetry monitoring -Was given IV fluid hydration which is now stopped -We will need her ALF to manage her medications at discharge   Dehydration -See above -She had clinical suspicion on admission given that she had some dry oral mucous membranes which are now improved -She also had a mild acute prerenal azotemia which is improved -IV fluid hydration has been given and now stopped -Continue strict I's and O's and daily weights   Abnormal LFTs -Mild and improving -Patient's AST on admission was 40 and trended down to 34 yesterday and today is now 25 -Patient's ALT was 55 and trended down to 46 yesterday and today is now 36 -Acute hepatitis panel is negative -Continue to monitor and trend  hepatic function panel and if necessary will obtain a right upper quadrant ultrasound -Repeat CMP within 1 week   Mild intermittent asthma -Continue with as needed albuterol inhalers and nebulizers -Currently not in exacerbation   Gastritis/GERD/GI prophylaxis -No longer taking Pantoprazole 40 mg p.o. twice daily -Given a dose of IV famotidine once -We will start pantoprazole 40 mg daily   Hypokalemia -Patient's potassium is now 4.3 -Mag level is now 2.3 -Continue monitor and replete as necessary -Repeat CMP in the a.m.   Essential Hypertension -Resume home amlodipine 5 mg p.o. daily -Continue monitor blood pressures per protocol -Last blood pressure reading was 131/85   Hyperlipidemia -She is on a atorvastatin in outpatient setting but given her slightly elevated LFTs we will hold off for now and per further investigation she is no longer taking this -CK level was 74 -Can resume statin at discharge   Anxiety -Start hydroxyzine 25 mg p.o. 3 times daily as needed -Follow-up with PCP in outpatient setting possibly psychiatry if significantly worse   Discharge Instructions  Discharge Instructions     Call MD for:  difficulty breathing, headache or visual disturbances   Complete by: As directed    Call MD for:  extreme fatigue   Complete by: As directed    Call MD for:  hives   Complete by: As directed    Call MD for:  persistant dizziness or light-headedness   Complete by: As directed    Call MD  for:  persistant nausea and vomiting   Complete by: As directed    Call MD for:  redness, tenderness, or signs of infection (pain, swelling, redness, odor or green/yellow discharge around incision site)   Complete by: As directed    Call MD for:  severe uncontrolled pain   Complete by: As directed    Call MD for:  temperature >100.4   Complete by: As directed    Diet - low sodium heart healthy   Complete by: As directed    Discharge instructions   Complete by: As directed     You were cared for by a hospitalist during your hospital stay. If you have any questions about your discharge medications or the care you received while you were in the hospital after you are discharged, you can call the unit and ask to speak with the hospitalist on call if the hospitalist that took care of you is not available. Once you are discharged, your primary care physician will handle any further medical issues. Please note that NO REFILLS for any discharge medications will be authorized once you are discharged, as it is imperative that you return to your primary care physician (or establish a relationship with a primary care physician if you do not have one) for your aftercare needs so that they can reassess your need for medications and monitor your lab values.  Follow up with PCP within 1-2 weeks and with Psychiatry in the outpatient as necessary. Take all medications as prescribed. If symptoms change or worsen please return to the ED for evaluation   Increase activity slowly   Complete by: As directed       Allergies as of 05/12/2022       Reactions   Dilaudid [hydromorphone Hcl] Itching   Morphine And Related Nausea And Vomiting        Medication List     STOP taking these medications    gabapentin 300 MG capsule Commonly known as: NEURONTIN   ibuprofen 200 MG tablet Commonly known as: ADVIL   traZODone 50 MG tablet Commonly known as: DESYREL       TAKE these medications    acetaminophen 325 MG tablet Commonly known as: TYLENOL Take 2 tablets (650 mg total) by mouth every 6 (six) hours as needed for mild pain (or Fever >/= 101). What changed:  medication strength how much to take when to take this reasons to take this   Advair HFA 115-21 MCG/ACT inhaler Generic drug: fluticasone-salmeterol Inhale 2 puffs into the lungs 2 (two) times daily.   albuterol 108 (90 Base) MCG/ACT inhaler Commonly known as: VENTOLIN HFA Inhale 1-2 puffs into the lungs every 6  (six) hours as needed for wheezing or shortness of breath.   amLODipine 5 MG tablet Commonly known as: NORVASC Take 1 tablet (5 mg total) by mouth daily. TAKE 1 TABLET(5 MG) BY MOUTH DAILY   atorvastatin 20 MG tablet Commonly known as: LIPITOR Take 1 tablet (20 mg total) by mouth daily at 6 PM.   feeding supplement Liqd Take 237 mLs by mouth 2 (two) times daily between meals.   hydrOXYzine 10 MG tablet Commonly known as: ATARAX Take 2.5 tablets (25 mg total) by mouth every 8 (eight) hours as needed for anxiety.   multivitamin with minerals Tabs tablet Take 1 tablet by mouth daily.   OLANZapine 7.5 MG tablet Commonly known as: ZYPREXA Take 7.5 mg by mouth at bedtime.   ondansetron 4 MG disintegrating tablet Commonly known as:  Zofran ODT Take 1 tablet (4 mg total) by mouth every 8 (eight) hours as needed for nausea or vomiting.   pantoprazole 40 MG tablet Commonly known as: PROTONIX Take 1 tablet (40 mg total) by mouth 2 (two) times daily.   sennosides-docusate sodium 8.6-50 MG tablet Commonly known as: SENOKOT-S Take 1 tablet by mouth daily.   sertraline 25 MG tablet Commonly known as: ZOLOFT Take 25 mg by mouth daily.        Allergies  Allergen Reactions   Dilaudid [Hydromorphone Hcl] Itching   Morphine And Related Nausea And Vomiting    Consultations: None  Procedures/Studies: DG Chest Port 1 View  Result Date: 05/10/2022 CLINICAL DATA:  Acute encephalopathy EXAM: PORTABLE CHEST 1 VIEW COMPARISON:  Radiographs 12/29/2021 FINDINGS: Heart and mediastinal contours are within normal limits. Old depressed right rib fractures with associated pleuroparenchymal scarring. No focal consolidation, pleural effusion, or pneumothorax. Left shoulder arthroplasty. Degenerative arthritis right shoulder. IMPRESSION: No active disease or change since 12/29/2021. Electronically Signed   By: Placido Sou M.D.   On: 05/10/2022 08:36   CT Head Wo Contrast  Result Date:  05/09/2022 CLINICAL DATA:  Mental status change, unknown cause. EXAM: CT HEAD WITHOUT CONTRAST TECHNIQUE: Contiguous axial images were obtained from the base of the skull through the vertex without intravenous contrast. RADIATION DOSE REDUCTION: This exam was performed according to the departmental dose-optimization program which includes automated exposure control, adjustment of the mA and/or kV according to patient size and/or use of iterative reconstruction technique. COMPARISON:  CT head dated April 14, 2021 FINDINGS: Brain: No evidence of acute infarction, hemorrhage, hydrocephalus, extra-axial collection or mass lesion/mass effect. Mild cerebral atrophy and chronic microvascular ischemic changes of the white matter. Vascular: No hyperdense vessel or unexpected calcification. Skull: Normal. Negative for fracture or focal lesion. Sinuses/Orbits: No acute finding. Other: None. IMPRESSION: 1. No acute intracranial abnormality. 2. Mild cerebral atrophy and chronic microvascular ischemic changes of the white matter. Electronically Signed   By: Keane Police D.O.   On: 05/09/2022 22:18   CT Abdomen Pelvis W Contrast  Result Date: 05/09/2022 CLINICAL DATA:  Right lower quadrant abdominal pain. Elevated liver function studies. Nausea and abdominal pain. EXAM: CT ABDOMEN AND PELVIS WITH CONTRAST TECHNIQUE: Multidetector CT imaging of the abdomen and pelvis was performed using the standard protocol following bolus administration of intravenous contrast. RADIATION DOSE REDUCTION: This exam was performed according to the departmental dose-optimization program which includes automated exposure control, adjustment of the mA and/or kV according to patient size and/or use of iterative reconstruction technique. CONTRAST:  162m OMNIPAQUE IOHEXOL 300 MG/ML  SOLN COMPARISON:  10/23/2021 FINDINGS: Lower chest: Nodule in the right lung base measuring 9 mm diameter, possibly associated with scarring. This is unchanged since the  prior study. Right breast implant. Hepatobiliary: No focal liver abnormality is seen. No gallstones, gallbladder wall thickening, or biliary dilatation. Pancreas: Hyperenhancing nodule in the body of the pancreas measuring 11 mm diameter. No change since prior study. This corresponds with known neuroendocrine tumor. Spleen: Normal in size without focal abnormality. Adrenals/Urinary Tract: Adrenal glands are unremarkable. Kidneys are normal, without renal calculi, focal lesion, or hydronephrosis. Bladder is unremarkable. Stomach/Bowel: Stomach is decompressed. Despite decompression, the gastric wall appears mildly thickened with low-attenuation around the rim. This may indicate gastritis. Small bowel and colon are decompressed but otherwise unremarkable. Appendix is normal. Vascular/Lymphatic: Aortic atherosclerosis. No enlarged abdominal or pelvic lymph nodes. Reproductive: Status post hysterectomy. No adnexal masses. Other: No abdominal wall hernia or abnormality.  No abdominopelvic ascites. Musculoskeletal: Degenerative changes and scoliosis of the spine. No destructive bone lesions. IMPRESSION: 1. Decompression of the stomach with suggestion of wall thickening and peripheral low-attenuation possibly indicating gastritis. 2. Stable appearance of 9 mm nodule in the right lung base. 3. Stable appearance of 11 mm hyperenhancing nodule in the body of the pancreas consistent with known neuroendocrine tumor. 4. Aortic atherosclerosis. Electronically Signed   By: Lucienne Capers M.D.   On: 05/09/2022 19:49   DG Hand Complete Right  Result Date: 04/23/2022 CLINICAL DATA:  Hand pain EXAM: RIGHT HAND - COMPLETE 3+ VIEW COMPARISON:  None Available. FINDINGS: Joint space narrowing within the MCP joints. Mild degenerative changes at the 1st carpometacarpal joint. No acute bony abnormality. Specifically, no fracture, subluxation, or dislocation. Soft tissues are intact. IMPRESSION: No acute bony abnormality. Electronically  Signed   By: Rolm Baptise M.D.   On: 04/23/2022 19:10     Subjective: Seen and examined at bedside this morning she states that she is doing a bit better.  She had some nausea earlier but this is relieved with Zofran.  No chest pain or shortness of breath.  She is awake and alert and oriented.  No other concerns or plaints this time.  Nursing states that she started sobbing and had very high anxiety so she was given lorazepam with improvement.  She will need follow-up with PCP and psychiatry in outpatient setting given that she is medically stable.  Discharge Exam: Vitals:   05/11/22 1946 05/12/22 0416  BP: 135/79 119/82  Pulse: 81 76  Resp: 20 20  Temp: 98.2 F (36.8 C) 97.7 F (36.5 C)  SpO2: 95% 95%   Vitals:   05/11/22 0500 05/11/22 1123 05/11/22 1946 05/12/22 0416  BP:  131/85 135/79 119/82  Pulse:  85 81 76  Resp:   20 20  Temp:  98 F (36.7 C) 98.2 F (36.8 C) 97.7 F (36.5 C)  TempSrc:  Oral    SpO2:  96% 95% 95%  Weight: 56.9 kg   56.2 kg  Height:       General: Pt is alert, awake, not in acute distress and she is resting Cardiovascular: RRR, S1/S2 +, no rubs, no gallops Respiratory: Diminished bilaterally, no wheezing, no rhonchi Abdominal: Soft, NT, ND, bowel sounds + Extremities: no edema, no cyanosis  The results of significant diagnostics from this hospitalization (including imaging, microbiology, ancillary and laboratory) are listed below for reference.    Microbiology: No results found for this or any previous visit (from the past 240 hour(s)).   Labs: BNP (last 3 results) Recent Labs    10/07/21 1500  BNP 44.3    Basic Metabolic Panel: Recent Labs  Lab 05/09/22 1640 05/09/22 1856 05/10/22 0104 05/11/22 0513  NA 137  --  138 139  K 3.7  --  3.3* 4.3  CL 105  --  104 107  CO2 22  --  23 25  GLUCOSE 126*  --  143* 106*  BUN 16  --  16 16  CREATININE 0.60  --  0.52 0.41*  CALCIUM 9.5  --  8.7* 9.3  MG  --  2.0 1.9 2.3  PHOS  --   --   --   4.4    Liver Function Tests: Recent Labs  Lab 05/09/22 1640 05/10/22 0104 05/11/22 0513  AST 48* 34 25  ALT 55* 46* 36  ALKPHOS 108 88 92  BILITOT 0.8 0.5 0.6  PROT 7.2 6.3* 6.6  ALBUMIN 4.3 3.6 3.8    Recent Labs  Lab 05/09/22 1640  LIPASE 33    Recent Labs  Lab 05/10/22 0547  AMMONIA 17    CBC: Recent Labs  Lab 05/09/22 1640 05/10/22 0104 05/11/22 0513  WBC 8.6 7.5 6.3  NEUTROABS  --  3.7 3.0  HGB 13.4 12.1 12.5  HCT 39.1 35.7* 38.4  MCV 95.6 96.7 99.7  PLT 375 309 334    Cardiac Enzymes: Recent Labs  Lab 05/10/22 0104  CKTOTAL 74    BNP: Invalid input(s): "POCBNP" CBG: Recent Labs  Lab 05/11/22 1947  GLUCAP 133*   D-Dimer No results for input(s): "DDIMER" in the last 72 hours. Hgb A1c No results for input(s): "HGBA1C" in the last 72 hours. Lipid Profile No results for input(s): "CHOL", "HDL", "LDLCALC", "TRIG", "CHOLHDL", "LDLDIRECT" in the last 72 hours. Thyroid function studies Recent Labs    05/10/22 0547  TSH 1.453    Anemia work up No results for input(s): "VITAMINB12", "FOLATE", "FERRITIN", "TIBC", "IRON", "RETICCTPCT" in the last 72 hours. Urinalysis    Component Value Date/Time   COLORURINE YELLOW 05/09/2022 2136   APPEARANCEUR CLEAR 05/09/2022 2136   LABSPEC 1.015 05/09/2022 2136   PHURINE 6.5 05/09/2022 2136   GLUCOSEU NEGATIVE 05/09/2022 2136   HGBUR NEGATIVE 05/09/2022 2136   BILIRUBINUR NEGATIVE 05/09/2022 2136   BILIRUBINUR negative 12/31/2016 1145   KETONESUR NEGATIVE 05/09/2022 2136   PROTEINUR NEGATIVE 05/09/2022 2136   UROBILINOGEN 0.2 12/31/2016 1145   UROBILINOGEN 0.2 03/14/2012 0612   NITRITE NEGATIVE 05/09/2022 2136   LEUKOCYTESUR NEGATIVE 05/09/2022 2136   Sepsis Labs Recent Labs  Lab 05/09/22 1640 05/10/22 0104 05/11/22 0513  WBC 8.6 7.5 6.3    Microbiology No results found for this or any previous visit (from the past 240 hour(s)).  Time coordinating discharge: Over 35  minutes  SIGNED:  Raiford Noble, DO [facility] 05/12/2022, 11:58 AM Pager is on [place]  If 7PM-7AM, please contact night-coverage [email or web address]

## 2022-05-12 NOTE — Progress Notes (Signed)
Patient's family will be be bringing the patient to her ALF. Belongings were returned. Education on medications will be provided.

## 2022-05-14 NOTE — Telephone Encounter (Signed)
Please schedule hospital follow up

## 2022-05-16 ENCOUNTER — Ambulatory Visit: Payer: Medicare Other | Admitting: Family Medicine

## 2022-05-16 DIAGNOSIS — Z743 Need for continuous supervision: Secondary | ICD-10-CM | POA: Diagnosis not present

## 2022-05-16 DIAGNOSIS — K5909 Other constipation: Secondary | ICD-10-CM | POA: Diagnosis not present

## 2022-05-16 DIAGNOSIS — K5900 Constipation, unspecified: Secondary | ICD-10-CM | POA: Diagnosis not present

## 2022-05-18 DIAGNOSIS — D3A8 Other benign neuroendocrine tumors: Secondary | ICD-10-CM | POA: Diagnosis not present

## 2022-05-18 DIAGNOSIS — C7A8 Other malignant neuroendocrine tumors: Secondary | ICD-10-CM | POA: Diagnosis not present

## 2022-05-18 DIAGNOSIS — E876 Hypokalemia: Secondary | ICD-10-CM | POA: Diagnosis not present

## 2022-05-18 DIAGNOSIS — N1330 Unspecified hydronephrosis: Secondary | ICD-10-CM | POA: Diagnosis not present

## 2022-05-18 DIAGNOSIS — Z79899 Other long term (current) drug therapy: Secondary | ICD-10-CM | POA: Diagnosis not present

## 2022-05-18 DIAGNOSIS — K5901 Slow transit constipation: Secondary | ICD-10-CM | POA: Diagnosis not present

## 2022-05-18 DIAGNOSIS — I10 Essential (primary) hypertension: Secondary | ICD-10-CM | POA: Diagnosis not present

## 2022-05-18 DIAGNOSIS — I160 Hypertensive urgency: Secondary | ICD-10-CM | POA: Diagnosis not present

## 2022-05-18 DIAGNOSIS — R339 Retention of urine, unspecified: Secondary | ICD-10-CM | POA: Diagnosis not present

## 2022-05-18 DIAGNOSIS — R112 Nausea with vomiting, unspecified: Secondary | ICD-10-CM | POA: Diagnosis not present

## 2022-05-18 DIAGNOSIS — K5909 Other constipation: Secondary | ICD-10-CM | POA: Diagnosis not present

## 2022-05-18 DIAGNOSIS — Z743 Need for continuous supervision: Secondary | ICD-10-CM | POA: Diagnosis not present

## 2022-05-18 DIAGNOSIS — R109 Unspecified abdominal pain: Secondary | ICD-10-CM | POA: Diagnosis not present

## 2022-05-18 DIAGNOSIS — R110 Nausea: Secondary | ICD-10-CM | POA: Diagnosis not present

## 2022-05-18 DIAGNOSIS — R1084 Generalized abdominal pain: Secondary | ICD-10-CM | POA: Diagnosis not present

## 2022-05-18 DIAGNOSIS — N3289 Other specified disorders of bladder: Secondary | ICD-10-CM | POA: Diagnosis not present

## 2022-05-19 DIAGNOSIS — K5901 Slow transit constipation: Secondary | ICD-10-CM | POA: Diagnosis not present

## 2022-05-23 ENCOUNTER — Telehealth: Payer: Self-pay | Admitting: Family Medicine

## 2022-05-23 NOTE — Telephone Encounter (Signed)
Melissa from [facility] called in asking for a verbal order for a wanderguard for the pt. She states that she told them that she was leaving the facility.  Please call [phone] to give the verbal ok

## 2022-05-23 NOTE — Telephone Encounter (Signed)
Okay to provide orders?

## 2022-05-24 DIAGNOSIS — F419 Anxiety disorder, unspecified: Secondary | ICD-10-CM | POA: Diagnosis not present

## 2022-05-24 DIAGNOSIS — G3184 Mild cognitive impairment, so stated: Secondary | ICD-10-CM | POA: Diagnosis not present

## 2022-05-24 NOTE — Telephone Encounter (Signed)
Called [name]. Staff has been able to talk her down from leaving, after talking to family.  Wandergard is a device that locks door if she tries to walk out door.  In talking with [name], still trying to maintain some independence for patient and do not feel that the wander guard is needed at this time after discussions yesterday.  Plan to continue to communicate with patient and hopefully will start to settle in to her new living situation soon.  Advised to call if we need to revisit the wanderguard but we will hold on that for now.

## 2022-06-05 DIAGNOSIS — F331 Major depressive disorder, recurrent, moderate: Secondary | ICD-10-CM | POA: Diagnosis not present

## 2022-06-05 DIAGNOSIS — F5105 Insomnia due to other mental disorder: Secondary | ICD-10-CM | POA: Diagnosis not present

## 2022-06-05 DIAGNOSIS — F411 Generalized anxiety disorder: Secondary | ICD-10-CM | POA: Diagnosis not present

## 2022-06-05 DIAGNOSIS — G3184 Mild cognitive impairment, so stated: Secondary | ICD-10-CM | POA: Diagnosis not present

## 2022-06-10 DIAGNOSIS — F0390 Unspecified dementia without behavioral disturbance: Secondary | ICD-10-CM | POA: Diagnosis not present

## 2022-06-10 DIAGNOSIS — F321 Major depressive disorder, single episode, moderate: Secondary | ICD-10-CM | POA: Diagnosis not present

## 2022-06-19 DIAGNOSIS — F5105 Insomnia due to other mental disorder: Secondary | ICD-10-CM | POA: Diagnosis not present

## 2022-06-19 DIAGNOSIS — F411 Generalized anxiety disorder: Secondary | ICD-10-CM | POA: Diagnosis not present

## 2022-06-19 DIAGNOSIS — G3184 Mild cognitive impairment, so stated: Secondary | ICD-10-CM | POA: Diagnosis not present

## 2022-06-19 DIAGNOSIS — F331 Major depressive disorder, recurrent, moderate: Secondary | ICD-10-CM | POA: Diagnosis not present

## 2022-06-25 DIAGNOSIS — K219 Gastro-esophageal reflux disease without esophagitis: Secondary | ICD-10-CM | POA: Diagnosis not present

## 2022-06-25 DIAGNOSIS — F4323 Adjustment disorder with mixed anxiety and depressed mood: Secondary | ICD-10-CM | POA: Diagnosis not present

## 2022-06-25 DIAGNOSIS — K5904 Chronic idiopathic constipation: Secondary | ICD-10-CM | POA: Diagnosis not present

## 2022-06-25 DIAGNOSIS — K649 Unspecified hemorrhoids: Secondary | ICD-10-CM | POA: Diagnosis not present

## 2022-07-03 DIAGNOSIS — F0390 Unspecified dementia without behavioral disturbance: Secondary | ICD-10-CM | POA: Diagnosis not present

## 2022-07-03 DIAGNOSIS — F321 Major depressive disorder, single episode, moderate: Secondary | ICD-10-CM | POA: Diagnosis not present

## 2022-07-03 DIAGNOSIS — G4700 Insomnia, unspecified: Secondary | ICD-10-CM | POA: Diagnosis not present

## 2022-07-03 DIAGNOSIS — F331 Major depressive disorder, recurrent, moderate: Secondary | ICD-10-CM | POA: Diagnosis not present

## 2022-07-03 DIAGNOSIS — F419 Anxiety disorder, unspecified: Secondary | ICD-10-CM | POA: Diagnosis not present

## 2022-07-03 DIAGNOSIS — G3184 Mild cognitive impairment, so stated: Secondary | ICD-10-CM | POA: Diagnosis not present

## 2022-07-13 DIAGNOSIS — Z23 Encounter for immunization: Secondary | ICD-10-CM | POA: Diagnosis not present

## 2022-07-17 DIAGNOSIS — E785 Hyperlipidemia, unspecified: Secondary | ICD-10-CM | POA: Diagnosis not present

## 2022-07-17 DIAGNOSIS — F0393 Unspecified dementia, unspecified severity, with mood disturbance: Secondary | ICD-10-CM | POA: Diagnosis not present

## 2022-07-17 DIAGNOSIS — F0390 Unspecified dementia without behavioral disturbance: Secondary | ICD-10-CM | POA: Diagnosis not present

## 2022-07-17 DIAGNOSIS — K219 Gastro-esophageal reflux disease without esophagitis: Secondary | ICD-10-CM | POA: Diagnosis not present

## 2022-07-17 DIAGNOSIS — K5904 Chronic idiopathic constipation: Secondary | ICD-10-CM | POA: Diagnosis not present

## 2022-07-17 DIAGNOSIS — F331 Major depressive disorder, recurrent, moderate: Secondary | ICD-10-CM | POA: Diagnosis not present

## 2022-07-17 DIAGNOSIS — F419 Anxiety disorder, unspecified: Secondary | ICD-10-CM | POA: Diagnosis not present

## 2022-07-17 DIAGNOSIS — F5105 Insomnia due to other mental disorder: Secondary | ICD-10-CM | POA: Diagnosis not present

## 2022-07-17 DIAGNOSIS — G4700 Insomnia, unspecified: Secondary | ICD-10-CM | POA: Diagnosis not present

## 2022-07-17 DIAGNOSIS — I10 Essential (primary) hypertension: Secondary | ICD-10-CM | POA: Diagnosis not present

## 2022-07-18 DIAGNOSIS — Z9181 History of falling: Secondary | ICD-10-CM | POA: Diagnosis not present

## 2022-07-18 DIAGNOSIS — G4700 Insomnia, unspecified: Secondary | ICD-10-CM | POA: Diagnosis not present

## 2022-07-18 DIAGNOSIS — M150 Primary generalized (osteo)arthritis: Secondary | ICD-10-CM | POA: Diagnosis not present

## 2022-07-18 DIAGNOSIS — F0394 Unspecified dementia, unspecified severity, with anxiety: Secondary | ICD-10-CM | POA: Diagnosis not present

## 2022-07-18 DIAGNOSIS — R2681 Unsteadiness on feet: Secondary | ICD-10-CM | POA: Diagnosis not present

## 2022-07-18 DIAGNOSIS — K219 Gastro-esophageal reflux disease without esophagitis: Secondary | ICD-10-CM | POA: Diagnosis not present

## 2022-07-18 DIAGNOSIS — I10 Essential (primary) hypertension: Secondary | ICD-10-CM | POA: Diagnosis not present

## 2022-07-18 DIAGNOSIS — F32A Depression, unspecified: Secondary | ICD-10-CM | POA: Diagnosis not present

## 2022-07-18 DIAGNOSIS — E876 Hypokalemia: Secondary | ICD-10-CM | POA: Diagnosis not present

## 2022-07-18 DIAGNOSIS — J4520 Mild intermittent asthma, uncomplicated: Secondary | ICD-10-CM | POA: Diagnosis not present

## 2022-07-18 DIAGNOSIS — F03918 Unspecified dementia, unspecified severity, with other behavioral disturbance: Secondary | ICD-10-CM | POA: Diagnosis not present

## 2022-07-18 DIAGNOSIS — R7303 Prediabetes: Secondary | ICD-10-CM | POA: Diagnosis not present

## 2022-07-18 DIAGNOSIS — F0393 Unspecified dementia, unspecified severity, with mood disturbance: Secondary | ICD-10-CM | POA: Diagnosis not present

## 2022-07-23 DIAGNOSIS — J4520 Mild intermittent asthma, uncomplicated: Secondary | ICD-10-CM | POA: Diagnosis not present

## 2022-07-23 DIAGNOSIS — K219 Gastro-esophageal reflux disease without esophagitis: Secondary | ICD-10-CM | POA: Diagnosis not present

## 2022-07-23 DIAGNOSIS — M150 Primary generalized (osteo)arthritis: Secondary | ICD-10-CM | POA: Diagnosis not present

## 2022-07-23 DIAGNOSIS — I10 Essential (primary) hypertension: Secondary | ICD-10-CM | POA: Diagnosis not present

## 2022-07-23 DIAGNOSIS — E876 Hypokalemia: Secondary | ICD-10-CM | POA: Diagnosis not present

## 2022-07-23 DIAGNOSIS — R7303 Prediabetes: Secondary | ICD-10-CM | POA: Diagnosis not present

## 2022-07-25 DIAGNOSIS — R7303 Prediabetes: Secondary | ICD-10-CM | POA: Diagnosis not present

## 2022-07-25 DIAGNOSIS — K219 Gastro-esophageal reflux disease without esophagitis: Secondary | ICD-10-CM | POA: Diagnosis not present

## 2022-07-25 DIAGNOSIS — J4520 Mild intermittent asthma, uncomplicated: Secondary | ICD-10-CM | POA: Diagnosis not present

## 2022-07-25 DIAGNOSIS — I10 Essential (primary) hypertension: Secondary | ICD-10-CM | POA: Diagnosis not present

## 2022-07-25 DIAGNOSIS — E876 Hypokalemia: Secondary | ICD-10-CM | POA: Diagnosis not present

## 2022-07-25 DIAGNOSIS — M150 Primary generalized (osteo)arthritis: Secondary | ICD-10-CM | POA: Diagnosis not present

## 2022-07-27 DIAGNOSIS — E559 Vitamin D deficiency, unspecified: Secondary | ICD-10-CM | POA: Diagnosis not present

## 2022-07-27 DIAGNOSIS — Z79899 Other long term (current) drug therapy: Secondary | ICD-10-CM | POA: Diagnosis not present

## 2022-07-31 DIAGNOSIS — M150 Primary generalized (osteo)arthritis: Secondary | ICD-10-CM | POA: Diagnosis not present

## 2022-07-31 DIAGNOSIS — K219 Gastro-esophageal reflux disease without esophagitis: Secondary | ICD-10-CM | POA: Diagnosis not present

## 2022-07-31 DIAGNOSIS — R7303 Prediabetes: Secondary | ICD-10-CM | POA: Diagnosis not present

## 2022-07-31 DIAGNOSIS — G4700 Insomnia, unspecified: Secondary | ICD-10-CM | POA: Diagnosis not present

## 2022-07-31 DIAGNOSIS — F0393 Unspecified dementia, unspecified severity, with mood disturbance: Secondary | ICD-10-CM | POA: Diagnosis not present

## 2022-07-31 DIAGNOSIS — F331 Major depressive disorder, recurrent, moderate: Secondary | ICD-10-CM | POA: Diagnosis not present

## 2022-07-31 DIAGNOSIS — F419 Anxiety disorder, unspecified: Secondary | ICD-10-CM | POA: Diagnosis not present

## 2022-07-31 DIAGNOSIS — I10 Essential (primary) hypertension: Secondary | ICD-10-CM | POA: Diagnosis not present

## 2022-07-31 DIAGNOSIS — J4520 Mild intermittent asthma, uncomplicated: Secondary | ICD-10-CM | POA: Diagnosis not present

## 2022-07-31 DIAGNOSIS — E876 Hypokalemia: Secondary | ICD-10-CM | POA: Diagnosis not present

## 2022-07-31 DIAGNOSIS — Z23 Encounter for immunization: Secondary | ICD-10-CM | POA: Diagnosis not present

## 2022-08-02 DIAGNOSIS — E876 Hypokalemia: Secondary | ICD-10-CM | POA: Diagnosis not present

## 2022-08-02 DIAGNOSIS — J4520 Mild intermittent asthma, uncomplicated: Secondary | ICD-10-CM | POA: Diagnosis not present

## 2022-08-02 DIAGNOSIS — I10 Essential (primary) hypertension: Secondary | ICD-10-CM | POA: Diagnosis not present

## 2022-08-02 DIAGNOSIS — M150 Primary generalized (osteo)arthritis: Secondary | ICD-10-CM | POA: Diagnosis not present

## 2022-08-02 DIAGNOSIS — K219 Gastro-esophageal reflux disease without esophagitis: Secondary | ICD-10-CM | POA: Diagnosis not present

## 2022-08-02 DIAGNOSIS — R7303 Prediabetes: Secondary | ICD-10-CM | POA: Diagnosis not present

## 2022-08-06 DIAGNOSIS — E876 Hypokalemia: Secondary | ICD-10-CM | POA: Diagnosis not present

## 2022-08-06 DIAGNOSIS — M150 Primary generalized (osteo)arthritis: Secondary | ICD-10-CM | POA: Diagnosis not present

## 2022-08-06 DIAGNOSIS — K219 Gastro-esophageal reflux disease without esophagitis: Secondary | ICD-10-CM | POA: Diagnosis not present

## 2022-08-06 DIAGNOSIS — I10 Essential (primary) hypertension: Secondary | ICD-10-CM | POA: Diagnosis not present

## 2022-08-06 DIAGNOSIS — R7303 Prediabetes: Secondary | ICD-10-CM | POA: Diagnosis not present

## 2022-08-06 DIAGNOSIS — J4520 Mild intermittent asthma, uncomplicated: Secondary | ICD-10-CM | POA: Diagnosis not present

## 2022-08-07 DIAGNOSIS — F0393 Unspecified dementia, unspecified severity, with mood disturbance: Secondary | ICD-10-CM | POA: Diagnosis not present

## 2022-08-07 DIAGNOSIS — G603 Idiopathic progressive neuropathy: Secondary | ICD-10-CM | POA: Diagnosis not present

## 2022-08-07 DIAGNOSIS — I10 Essential (primary) hypertension: Secondary | ICD-10-CM | POA: Diagnosis not present

## 2022-08-07 DIAGNOSIS — E559 Vitamin D deficiency, unspecified: Secondary | ICD-10-CM | POA: Diagnosis not present

## 2022-08-08 DIAGNOSIS — I10 Essential (primary) hypertension: Secondary | ICD-10-CM | POA: Diagnosis not present

## 2022-08-08 DIAGNOSIS — M150 Primary generalized (osteo)arthritis: Secondary | ICD-10-CM | POA: Diagnosis not present

## 2022-08-08 DIAGNOSIS — K219 Gastro-esophageal reflux disease without esophagitis: Secondary | ICD-10-CM | POA: Diagnosis not present

## 2022-08-08 DIAGNOSIS — J4520 Mild intermittent asthma, uncomplicated: Secondary | ICD-10-CM | POA: Diagnosis not present

## 2022-08-08 DIAGNOSIS — E876 Hypokalemia: Secondary | ICD-10-CM | POA: Diagnosis not present

## 2022-08-08 DIAGNOSIS — R7303 Prediabetes: Secondary | ICD-10-CM | POA: Diagnosis not present

## 2022-08-13 DIAGNOSIS — J4520 Mild intermittent asthma, uncomplicated: Secondary | ICD-10-CM | POA: Diagnosis not present

## 2022-08-13 DIAGNOSIS — R7303 Prediabetes: Secondary | ICD-10-CM | POA: Diagnosis not present

## 2022-08-13 DIAGNOSIS — M150 Primary generalized (osteo)arthritis: Secondary | ICD-10-CM | POA: Diagnosis not present

## 2022-08-13 DIAGNOSIS — E876 Hypokalemia: Secondary | ICD-10-CM | POA: Diagnosis not present

## 2022-08-13 DIAGNOSIS — I10 Essential (primary) hypertension: Secondary | ICD-10-CM | POA: Diagnosis not present

## 2022-08-13 DIAGNOSIS — K219 Gastro-esophageal reflux disease without esophagitis: Secondary | ICD-10-CM | POA: Diagnosis not present

## 2022-08-14 DIAGNOSIS — G603 Idiopathic progressive neuropathy: Secondary | ICD-10-CM | POA: Diagnosis not present

## 2022-08-14 DIAGNOSIS — I10 Essential (primary) hypertension: Secondary | ICD-10-CM | POA: Diagnosis not present

## 2022-08-14 DIAGNOSIS — E785 Hyperlipidemia, unspecified: Secondary | ICD-10-CM | POA: Diagnosis not present

## 2022-08-14 DIAGNOSIS — F0393 Unspecified dementia, unspecified severity, with mood disturbance: Secondary | ICD-10-CM | POA: Diagnosis not present

## 2022-08-14 DIAGNOSIS — E559 Vitamin D deficiency, unspecified: Secondary | ICD-10-CM | POA: Diagnosis not present

## 2022-08-14 DIAGNOSIS — G309 Alzheimer's disease, unspecified: Secondary | ICD-10-CM | POA: Diagnosis not present

## 2022-08-14 DIAGNOSIS — F5105 Insomnia due to other mental disorder: Secondary | ICD-10-CM | POA: Diagnosis not present

## 2022-08-14 DIAGNOSIS — K219 Gastro-esophageal reflux disease without esophagitis: Secondary | ICD-10-CM | POA: Diagnosis not present

## 2022-08-14 DIAGNOSIS — F331 Major depressive disorder, recurrent, moderate: Secondary | ICD-10-CM | POA: Diagnosis not present

## 2022-08-14 DIAGNOSIS — F419 Anxiety disorder, unspecified: Secondary | ICD-10-CM | POA: Diagnosis not present

## 2022-08-17 DIAGNOSIS — F32A Depression, unspecified: Secondary | ICD-10-CM | POA: Diagnosis not present

## 2022-08-17 DIAGNOSIS — K219 Gastro-esophageal reflux disease without esophagitis: Secondary | ICD-10-CM | POA: Diagnosis not present

## 2022-08-17 DIAGNOSIS — R2681 Unsteadiness on feet: Secondary | ICD-10-CM | POA: Diagnosis not present

## 2022-08-17 DIAGNOSIS — R7303 Prediabetes: Secondary | ICD-10-CM | POA: Diagnosis not present

## 2022-08-17 DIAGNOSIS — Z9181 History of falling: Secondary | ICD-10-CM | POA: Diagnosis not present

## 2022-08-17 DIAGNOSIS — F0393 Unspecified dementia, unspecified severity, with mood disturbance: Secondary | ICD-10-CM | POA: Diagnosis not present

## 2022-08-17 DIAGNOSIS — J4520 Mild intermittent asthma, uncomplicated: Secondary | ICD-10-CM | POA: Diagnosis not present

## 2022-08-17 DIAGNOSIS — M150 Primary generalized (osteo)arthritis: Secondary | ICD-10-CM | POA: Diagnosis not present

## 2022-08-17 DIAGNOSIS — F03918 Unspecified dementia, unspecified severity, with other behavioral disturbance: Secondary | ICD-10-CM | POA: Diagnosis not present

## 2022-08-17 DIAGNOSIS — E876 Hypokalemia: Secondary | ICD-10-CM | POA: Diagnosis not present

## 2022-08-17 DIAGNOSIS — I10 Essential (primary) hypertension: Secondary | ICD-10-CM | POA: Diagnosis not present

## 2022-08-17 DIAGNOSIS — F0394 Unspecified dementia, unspecified severity, with anxiety: Secondary | ICD-10-CM | POA: Diagnosis not present

## 2022-08-17 DIAGNOSIS — G4700 Insomnia, unspecified: Secondary | ICD-10-CM | POA: Diagnosis not present

## 2022-08-22 DIAGNOSIS — K219 Gastro-esophageal reflux disease without esophagitis: Secondary | ICD-10-CM | POA: Diagnosis not present

## 2022-08-22 DIAGNOSIS — E876 Hypokalemia: Secondary | ICD-10-CM | POA: Diagnosis not present

## 2022-08-22 DIAGNOSIS — J4520 Mild intermittent asthma, uncomplicated: Secondary | ICD-10-CM | POA: Diagnosis not present

## 2022-08-22 DIAGNOSIS — M150 Primary generalized (osteo)arthritis: Secondary | ICD-10-CM | POA: Diagnosis not present

## 2022-08-22 DIAGNOSIS — R7303 Prediabetes: Secondary | ICD-10-CM | POA: Diagnosis not present

## 2022-08-22 DIAGNOSIS — I10 Essential (primary) hypertension: Secondary | ICD-10-CM | POA: Diagnosis not present

## 2022-08-28 DIAGNOSIS — F419 Anxiety disorder, unspecified: Secondary | ICD-10-CM | POA: Diagnosis not present

## 2022-08-28 DIAGNOSIS — G309 Alzheimer's disease, unspecified: Secondary | ICD-10-CM | POA: Diagnosis not present

## 2022-08-28 DIAGNOSIS — F02818 Dementia in other diseases classified elsewhere, unspecified severity, with other behavioral disturbance: Secondary | ICD-10-CM | POA: Diagnosis not present

## 2022-08-28 DIAGNOSIS — F331 Major depressive disorder, recurrent, moderate: Secondary | ICD-10-CM | POA: Diagnosis not present

## 2022-08-28 DIAGNOSIS — Z9183 Wandering in diseases classified elsewhere: Secondary | ICD-10-CM | POA: Diagnosis not present

## 2022-08-31 DIAGNOSIS — L84 Corns and callosities: Secondary | ICD-10-CM | POA: Diagnosis not present

## 2022-08-31 DIAGNOSIS — R7303 Prediabetes: Secondary | ICD-10-CM | POA: Diagnosis not present

## 2022-08-31 DIAGNOSIS — M150 Primary generalized (osteo)arthritis: Secondary | ICD-10-CM | POA: Diagnosis not present

## 2022-08-31 DIAGNOSIS — M79675 Pain in left toe(s): Secondary | ICD-10-CM | POA: Diagnosis not present

## 2022-08-31 DIAGNOSIS — K219 Gastro-esophageal reflux disease without esophagitis: Secondary | ICD-10-CM | POA: Diagnosis not present

## 2022-08-31 DIAGNOSIS — E876 Hypokalemia: Secondary | ICD-10-CM | POA: Diagnosis not present

## 2022-08-31 DIAGNOSIS — I10 Essential (primary) hypertension: Secondary | ICD-10-CM | POA: Diagnosis not present

## 2022-08-31 DIAGNOSIS — M2041 Other hammer toe(s) (acquired), right foot: Secondary | ICD-10-CM | POA: Diagnosis not present

## 2022-08-31 DIAGNOSIS — J4520 Mild intermittent asthma, uncomplicated: Secondary | ICD-10-CM | POA: Diagnosis not present

## 2022-08-31 DIAGNOSIS — B351 Tinea unguium: Secondary | ICD-10-CM | POA: Diagnosis not present

## 2022-09-04 DIAGNOSIS — E876 Hypokalemia: Secondary | ICD-10-CM | POA: Diagnosis not present

## 2022-09-04 DIAGNOSIS — I10 Essential (primary) hypertension: Secondary | ICD-10-CM | POA: Diagnosis not present

## 2022-09-04 DIAGNOSIS — R7303 Prediabetes: Secondary | ICD-10-CM | POA: Diagnosis not present

## 2022-09-04 DIAGNOSIS — M150 Primary generalized (osteo)arthritis: Secondary | ICD-10-CM | POA: Diagnosis not present

## 2022-09-04 DIAGNOSIS — K219 Gastro-esophageal reflux disease without esophagitis: Secondary | ICD-10-CM | POA: Diagnosis not present

## 2022-09-04 DIAGNOSIS — J4520 Mild intermittent asthma, uncomplicated: Secondary | ICD-10-CM | POA: Diagnosis not present

## 2022-09-11 DIAGNOSIS — G603 Idiopathic progressive neuropathy: Secondary | ICD-10-CM | POA: Diagnosis not present

## 2022-09-11 DIAGNOSIS — G309 Alzheimer's disease, unspecified: Secondary | ICD-10-CM | POA: Diagnosis not present

## 2022-09-11 DIAGNOSIS — F5105 Insomnia due to other mental disorder: Secondary | ICD-10-CM | POA: Diagnosis not present

## 2022-09-11 DIAGNOSIS — E785 Hyperlipidemia, unspecified: Secondary | ICD-10-CM | POA: Diagnosis not present

## 2022-09-11 DIAGNOSIS — F419 Anxiety disorder, unspecified: Secondary | ICD-10-CM | POA: Diagnosis not present

## 2022-09-11 DIAGNOSIS — F331 Major depressive disorder, recurrent, moderate: Secondary | ICD-10-CM | POA: Diagnosis not present

## 2022-09-11 DIAGNOSIS — F02818 Dementia in other diseases classified elsewhere, unspecified severity, with other behavioral disturbance: Secondary | ICD-10-CM | POA: Diagnosis not present

## 2022-09-11 DIAGNOSIS — K219 Gastro-esophageal reflux disease without esophagitis: Secondary | ICD-10-CM | POA: Diagnosis not present

## 2022-09-11 DIAGNOSIS — I10 Essential (primary) hypertension: Secondary | ICD-10-CM | POA: Diagnosis not present

## 2022-09-12 DIAGNOSIS — F39 Unspecified mood [affective] disorder: Secondary | ICD-10-CM | POA: Diagnosis not present

## 2022-09-12 DIAGNOSIS — M150 Primary generalized (osteo)arthritis: Secondary | ICD-10-CM | POA: Diagnosis not present

## 2022-09-12 DIAGNOSIS — I10 Essential (primary) hypertension: Secondary | ICD-10-CM | POA: Diagnosis not present

## 2022-09-12 DIAGNOSIS — K219 Gastro-esophageal reflux disease without esophagitis: Secondary | ICD-10-CM | POA: Diagnosis not present

## 2022-09-12 DIAGNOSIS — G301 Alzheimer's disease with late onset: Secondary | ICD-10-CM | POA: Diagnosis not present

## 2022-09-12 DIAGNOSIS — J4520 Mild intermittent asthma, uncomplicated: Secondary | ICD-10-CM | POA: Diagnosis not present

## 2022-09-12 DIAGNOSIS — E876 Hypokalemia: Secondary | ICD-10-CM | POA: Diagnosis not present

## 2022-09-12 DIAGNOSIS — R7303 Prediabetes: Secondary | ICD-10-CM | POA: Diagnosis not present

## 2022-09-16 DIAGNOSIS — Z7951 Long term (current) use of inhaled steroids: Secondary | ICD-10-CM | POA: Diagnosis not present

## 2022-09-16 DIAGNOSIS — E876 Hypokalemia: Secondary | ICD-10-CM | POA: Diagnosis not present

## 2022-09-16 DIAGNOSIS — Z9181 History of falling: Secondary | ICD-10-CM | POA: Diagnosis not present

## 2022-09-16 DIAGNOSIS — M150 Primary generalized (osteo)arthritis: Secondary | ICD-10-CM | POA: Diagnosis not present

## 2022-09-16 DIAGNOSIS — I10 Essential (primary) hypertension: Secondary | ICD-10-CM | POA: Diagnosis not present

## 2022-09-16 DIAGNOSIS — F03918 Unspecified dementia, unspecified severity, with other behavioral disturbance: Secondary | ICD-10-CM | POA: Diagnosis not present

## 2022-09-16 DIAGNOSIS — R7303 Prediabetes: Secondary | ICD-10-CM | POA: Diagnosis not present

## 2022-09-16 DIAGNOSIS — F0393 Unspecified dementia, unspecified severity, with mood disturbance: Secondary | ICD-10-CM | POA: Diagnosis not present

## 2022-09-16 DIAGNOSIS — F32A Depression, unspecified: Secondary | ICD-10-CM | POA: Diagnosis not present

## 2022-09-16 DIAGNOSIS — J4520 Mild intermittent asthma, uncomplicated: Secondary | ICD-10-CM | POA: Diagnosis not present

## 2022-09-16 DIAGNOSIS — F0394 Unspecified dementia, unspecified severity, with anxiety: Secondary | ICD-10-CM | POA: Diagnosis not present

## 2022-09-16 DIAGNOSIS — K219 Gastro-esophageal reflux disease without esophagitis: Secondary | ICD-10-CM | POA: Diagnosis not present

## 2022-09-16 DIAGNOSIS — G4700 Insomnia, unspecified: Secondary | ICD-10-CM | POA: Diagnosis not present

## 2022-09-17 DIAGNOSIS — F32A Depression, unspecified: Secondary | ICD-10-CM | POA: Diagnosis not present

## 2022-09-17 DIAGNOSIS — I10 Essential (primary) hypertension: Secondary | ICD-10-CM | POA: Diagnosis not present

## 2022-09-17 DIAGNOSIS — M150 Primary generalized (osteo)arthritis: Secondary | ICD-10-CM | POA: Diagnosis not present

## 2022-09-17 DIAGNOSIS — G4700 Insomnia, unspecified: Secondary | ICD-10-CM | POA: Diagnosis not present

## 2022-09-17 DIAGNOSIS — F0393 Unspecified dementia, unspecified severity, with mood disturbance: Secondary | ICD-10-CM | POA: Diagnosis not present

## 2022-09-17 DIAGNOSIS — F03918 Unspecified dementia, unspecified severity, with other behavioral disturbance: Secondary | ICD-10-CM | POA: Diagnosis not present

## 2022-09-20 DIAGNOSIS — K5904 Chronic idiopathic constipation: Secondary | ICD-10-CM | POA: Diagnosis not present

## 2022-09-20 DIAGNOSIS — K219 Gastro-esophageal reflux disease without esophagitis: Secondary | ICD-10-CM | POA: Diagnosis not present

## 2022-09-20 DIAGNOSIS — R7303 Prediabetes: Secondary | ICD-10-CM | POA: Diagnosis not present

## 2022-09-20 DIAGNOSIS — F02818 Dementia in other diseases classified elsewhere, unspecified severity, with other behavioral disturbance: Secondary | ICD-10-CM | POA: Diagnosis not present

## 2022-09-20 DIAGNOSIS — G301 Alzheimer's disease with late onset: Secondary | ICD-10-CM | POA: Diagnosis not present

## 2022-09-20 DIAGNOSIS — F03918 Unspecified dementia, unspecified severity, with other behavioral disturbance: Secondary | ICD-10-CM | POA: Diagnosis not present

## 2022-09-20 DIAGNOSIS — G4700 Insomnia, unspecified: Secondary | ICD-10-CM | POA: Diagnosis not present

## 2022-09-20 DIAGNOSIS — F0393 Unspecified dementia, unspecified severity, with mood disturbance: Secondary | ICD-10-CM | POA: Diagnosis not present

## 2022-09-20 DIAGNOSIS — F32A Depression, unspecified: Secondary | ICD-10-CM | POA: Diagnosis not present

## 2022-09-20 DIAGNOSIS — M150 Primary generalized (osteo)arthritis: Secondary | ICD-10-CM | POA: Diagnosis not present

## 2022-09-20 DIAGNOSIS — F39 Unspecified mood [affective] disorder: Secondary | ICD-10-CM | POA: Diagnosis not present

## 2022-09-20 DIAGNOSIS — I10 Essential (primary) hypertension: Secondary | ICD-10-CM | POA: Diagnosis not present

## 2022-09-24 DIAGNOSIS — M150 Primary generalized (osteo)arthritis: Secondary | ICD-10-CM | POA: Diagnosis not present

## 2022-09-24 DIAGNOSIS — F0393 Unspecified dementia, unspecified severity, with mood disturbance: Secondary | ICD-10-CM | POA: Diagnosis not present

## 2022-09-24 DIAGNOSIS — F03918 Unspecified dementia, unspecified severity, with other behavioral disturbance: Secondary | ICD-10-CM | POA: Diagnosis not present

## 2022-09-24 DIAGNOSIS — G4700 Insomnia, unspecified: Secondary | ICD-10-CM | POA: Diagnosis not present

## 2022-09-24 DIAGNOSIS — F32A Depression, unspecified: Secondary | ICD-10-CM | POA: Diagnosis not present

## 2022-09-24 DIAGNOSIS — I10 Essential (primary) hypertension: Secondary | ICD-10-CM | POA: Diagnosis not present

## 2022-09-27 DIAGNOSIS — F03918 Unspecified dementia, unspecified severity, with other behavioral disturbance: Secondary | ICD-10-CM | POA: Diagnosis not present

## 2022-09-27 DIAGNOSIS — I10 Essential (primary) hypertension: Secondary | ICD-10-CM | POA: Diagnosis not present

## 2022-09-27 DIAGNOSIS — F32A Depression, unspecified: Secondary | ICD-10-CM | POA: Diagnosis not present

## 2022-09-27 DIAGNOSIS — M150 Primary generalized (osteo)arthritis: Secondary | ICD-10-CM | POA: Diagnosis not present

## 2022-09-27 DIAGNOSIS — G4700 Insomnia, unspecified: Secondary | ICD-10-CM | POA: Diagnosis not present

## 2022-09-27 DIAGNOSIS — F0393 Unspecified dementia, unspecified severity, with mood disturbance: Secondary | ICD-10-CM | POA: Diagnosis not present

## 2022-10-01 DIAGNOSIS — F0393 Unspecified dementia, unspecified severity, with mood disturbance: Secondary | ICD-10-CM | POA: Diagnosis not present

## 2022-10-01 DIAGNOSIS — M150 Primary generalized (osteo)arthritis: Secondary | ICD-10-CM | POA: Diagnosis not present

## 2022-10-01 DIAGNOSIS — I10 Essential (primary) hypertension: Secondary | ICD-10-CM | POA: Diagnosis not present

## 2022-10-02 DIAGNOSIS — F0393 Unspecified dementia, unspecified severity, with mood disturbance: Secondary | ICD-10-CM | POA: Diagnosis not present

## 2022-10-02 DIAGNOSIS — F32A Depression, unspecified: Secondary | ICD-10-CM | POA: Diagnosis not present

## 2022-10-02 DIAGNOSIS — I10 Essential (primary) hypertension: Secondary | ICD-10-CM | POA: Diagnosis not present

## 2022-10-02 DIAGNOSIS — F03918 Unspecified dementia, unspecified severity, with other behavioral disturbance: Secondary | ICD-10-CM | POA: Diagnosis not present

## 2022-10-02 DIAGNOSIS — G4700 Insomnia, unspecified: Secondary | ICD-10-CM | POA: Diagnosis not present

## 2022-10-02 DIAGNOSIS — M150 Primary generalized (osteo)arthritis: Secondary | ICD-10-CM | POA: Diagnosis not present

## 2022-10-04 DIAGNOSIS — R0789 Other chest pain: Secondary | ICD-10-CM | POA: Diagnosis not present

## 2022-10-04 DIAGNOSIS — R0981 Nasal congestion: Secondary | ICD-10-CM | POA: Diagnosis not present

## 2022-10-04 DIAGNOSIS — J328 Other chronic sinusitis: Secondary | ICD-10-CM | POA: Diagnosis not present

## 2022-10-04 DIAGNOSIS — R5081 Fever presenting with conditions classified elsewhere: Secondary | ICD-10-CM | POA: Diagnosis not present

## 2022-10-04 DIAGNOSIS — I517 Cardiomegaly: Secondary | ICD-10-CM | POA: Diagnosis not present

## 2022-10-04 DIAGNOSIS — R0602 Shortness of breath: Secondary | ICD-10-CM | POA: Diagnosis not present

## 2022-10-04 DIAGNOSIS — R5383 Other fatigue: Secondary | ICD-10-CM | POA: Diagnosis not present

## 2022-10-04 DIAGNOSIS — R059 Cough, unspecified: Secondary | ICD-10-CM | POA: Diagnosis not present

## 2022-10-10 DIAGNOSIS — F419 Anxiety disorder, unspecified: Secondary | ICD-10-CM | POA: Diagnosis not present

## 2022-10-10 DIAGNOSIS — F0393 Unspecified dementia, unspecified severity, with mood disturbance: Secondary | ICD-10-CM | POA: Diagnosis not present

## 2022-10-10 DIAGNOSIS — F02818 Dementia in other diseases classified elsewhere, unspecified severity, with other behavioral disturbance: Secondary | ICD-10-CM | POA: Diagnosis not present

## 2022-10-10 DIAGNOSIS — I10 Essential (primary) hypertension: Secondary | ICD-10-CM | POA: Diagnosis not present

## 2022-10-10 DIAGNOSIS — F03918 Unspecified dementia, unspecified severity, with other behavioral disturbance: Secondary | ICD-10-CM | POA: Diagnosis not present

## 2022-10-10 DIAGNOSIS — F5105 Insomnia due to other mental disorder: Secondary | ICD-10-CM | POA: Diagnosis not present

## 2022-10-10 DIAGNOSIS — F331 Major depressive disorder, recurrent, moderate: Secondary | ICD-10-CM | POA: Diagnosis not present

## 2022-10-10 DIAGNOSIS — M150 Primary generalized (osteo)arthritis: Secondary | ICD-10-CM | POA: Diagnosis not present

## 2022-10-10 DIAGNOSIS — G4700 Insomnia, unspecified: Secondary | ICD-10-CM | POA: Diagnosis not present

## 2022-10-10 DIAGNOSIS — F32A Depression, unspecified: Secondary | ICD-10-CM | POA: Diagnosis not present

## 2022-10-10 DIAGNOSIS — G309 Alzheimer's disease, unspecified: Secondary | ICD-10-CM | POA: Diagnosis not present

## 2022-10-13 DIAGNOSIS — M160 Bilateral primary osteoarthritis of hip: Secondary | ICD-10-CM | POA: Diagnosis not present

## 2022-10-13 DIAGNOSIS — F39 Unspecified mood [affective] disorder: Secondary | ICD-10-CM | POA: Diagnosis not present

## 2022-10-16 DIAGNOSIS — K5904 Chronic idiopathic constipation: Secondary | ICD-10-CM | POA: Diagnosis not present

## 2022-10-16 DIAGNOSIS — G603 Idiopathic progressive neuropathy: Secondary | ICD-10-CM | POA: Diagnosis not present

## 2022-10-16 DIAGNOSIS — F0393 Unspecified dementia, unspecified severity, with mood disturbance: Secondary | ICD-10-CM | POA: Diagnosis not present

## 2022-10-16 DIAGNOSIS — F02818 Dementia in other diseases classified elsewhere, unspecified severity, with other behavioral disturbance: Secondary | ICD-10-CM | POA: Diagnosis not present

## 2022-10-16 DIAGNOSIS — J111 Influenza due to unidentified influenza virus with other respiratory manifestations: Secondary | ICD-10-CM | POA: Diagnosis not present

## 2022-10-16 DIAGNOSIS — R7303 Prediabetes: Secondary | ICD-10-CM | POA: Diagnosis not present

## 2022-10-16 DIAGNOSIS — G4700 Insomnia, unspecified: Secondary | ICD-10-CM | POA: Diagnosis not present

## 2022-10-16 DIAGNOSIS — Z7951 Long term (current) use of inhaled steroids: Secondary | ICD-10-CM | POA: Diagnosis not present

## 2022-10-16 DIAGNOSIS — F32A Depression, unspecified: Secondary | ICD-10-CM | POA: Diagnosis not present

## 2022-10-16 DIAGNOSIS — Z9181 History of falling: Secondary | ICD-10-CM | POA: Diagnosis not present

## 2022-10-16 DIAGNOSIS — G309 Alzheimer's disease, unspecified: Secondary | ICD-10-CM | POA: Diagnosis not present

## 2022-10-16 DIAGNOSIS — F03918 Unspecified dementia, unspecified severity, with other behavioral disturbance: Secondary | ICD-10-CM | POA: Diagnosis not present

## 2022-10-16 DIAGNOSIS — I10 Essential (primary) hypertension: Secondary | ICD-10-CM | POA: Diagnosis not present

## 2022-10-16 DIAGNOSIS — K219 Gastro-esophageal reflux disease without esophagitis: Secondary | ICD-10-CM | POA: Diagnosis not present

## 2022-10-16 DIAGNOSIS — M150 Primary generalized (osteo)arthritis: Secondary | ICD-10-CM | POA: Diagnosis not present

## 2022-10-16 DIAGNOSIS — E876 Hypokalemia: Secondary | ICD-10-CM | POA: Diagnosis not present

## 2022-10-16 DIAGNOSIS — J4520 Mild intermittent asthma, uncomplicated: Secondary | ICD-10-CM | POA: Diagnosis not present

## 2022-10-16 DIAGNOSIS — F0394 Unspecified dementia, unspecified severity, with anxiety: Secondary | ICD-10-CM | POA: Diagnosis not present

## 2022-10-17 DIAGNOSIS — F32A Depression, unspecified: Secondary | ICD-10-CM | POA: Diagnosis not present

## 2022-10-17 DIAGNOSIS — I10 Essential (primary) hypertension: Secondary | ICD-10-CM | POA: Diagnosis not present

## 2022-10-17 DIAGNOSIS — F0393 Unspecified dementia, unspecified severity, with mood disturbance: Secondary | ICD-10-CM | POA: Diagnosis not present

## 2022-10-17 DIAGNOSIS — F03918 Unspecified dementia, unspecified severity, with other behavioral disturbance: Secondary | ICD-10-CM | POA: Diagnosis not present

## 2022-10-17 DIAGNOSIS — M150 Primary generalized (osteo)arthritis: Secondary | ICD-10-CM | POA: Diagnosis not present

## 2022-10-17 DIAGNOSIS — G4700 Insomnia, unspecified: Secondary | ICD-10-CM | POA: Diagnosis not present

## 2022-10-24 DIAGNOSIS — M150 Primary generalized (osteo)arthritis: Secondary | ICD-10-CM | POA: Diagnosis not present

## 2022-10-24 DIAGNOSIS — F0393 Unspecified dementia, unspecified severity, with mood disturbance: Secondary | ICD-10-CM | POA: Diagnosis not present

## 2022-10-24 DIAGNOSIS — F03918 Unspecified dementia, unspecified severity, with other behavioral disturbance: Secondary | ICD-10-CM | POA: Diagnosis not present

## 2022-10-24 DIAGNOSIS — G4700 Insomnia, unspecified: Secondary | ICD-10-CM | POA: Diagnosis not present

## 2022-10-24 DIAGNOSIS — I10 Essential (primary) hypertension: Secondary | ICD-10-CM | POA: Diagnosis not present

## 2022-10-24 DIAGNOSIS — F32A Depression, unspecified: Secondary | ICD-10-CM | POA: Diagnosis not present

## 2022-10-29 DIAGNOSIS — R5081 Fever presenting with conditions classified elsewhere: Secondary | ICD-10-CM | POA: Diagnosis not present

## 2022-10-29 DIAGNOSIS — F331 Major depressive disorder, recurrent, moderate: Secondary | ICD-10-CM | POA: Diagnosis not present

## 2022-10-29 DIAGNOSIS — R5383 Other fatigue: Secondary | ICD-10-CM | POA: Diagnosis not present

## 2022-10-29 DIAGNOSIS — R0602 Shortness of breath: Secondary | ICD-10-CM | POA: Diagnosis not present

## 2022-10-29 DIAGNOSIS — R0981 Nasal congestion: Secondary | ICD-10-CM | POA: Diagnosis not present

## 2022-10-29 DIAGNOSIS — R0789 Other chest pain: Secondary | ICD-10-CM | POA: Diagnosis not present

## 2022-10-29 DIAGNOSIS — J328 Other chronic sinusitis: Secondary | ICD-10-CM | POA: Diagnosis not present

## 2022-10-29 DIAGNOSIS — F419 Anxiety disorder, unspecified: Secondary | ICD-10-CM | POA: Diagnosis not present

## 2022-11-06 DIAGNOSIS — F5105 Insomnia due to other mental disorder: Secondary | ICD-10-CM | POA: Diagnosis not present

## 2022-11-06 DIAGNOSIS — F419 Anxiety disorder, unspecified: Secondary | ICD-10-CM | POA: Diagnosis not present

## 2022-11-06 DIAGNOSIS — G309 Alzheimer's disease, unspecified: Secondary | ICD-10-CM | POA: Diagnosis not present

## 2022-11-06 DIAGNOSIS — F331 Major depressive disorder, recurrent, moderate: Secondary | ICD-10-CM | POA: Diagnosis not present

## 2022-11-06 DIAGNOSIS — F02818 Dementia in other diseases classified elsewhere, unspecified severity, with other behavioral disturbance: Secondary | ICD-10-CM | POA: Diagnosis not present

## 2022-11-13 DIAGNOSIS — I10 Essential (primary) hypertension: Secondary | ICD-10-CM | POA: Diagnosis not present

## 2022-11-13 DIAGNOSIS — G603 Idiopathic progressive neuropathy: Secondary | ICD-10-CM | POA: Diagnosis not present

## 2022-11-13 DIAGNOSIS — G309 Alzheimer's disease, unspecified: Secondary | ICD-10-CM | POA: Diagnosis not present

## 2022-11-13 DIAGNOSIS — F02818 Dementia in other diseases classified elsewhere, unspecified severity, with other behavioral disturbance: Secondary | ICD-10-CM | POA: Diagnosis not present

## 2022-11-13 DIAGNOSIS — M160 Bilateral primary osteoarthritis of hip: Secondary | ICD-10-CM | POA: Diagnosis not present

## 2022-11-14 DIAGNOSIS — F39 Unspecified mood [affective] disorder: Secondary | ICD-10-CM | POA: Diagnosis not present

## 2022-11-14 DIAGNOSIS — M160 Bilateral primary osteoarthritis of hip: Secondary | ICD-10-CM | POA: Diagnosis not present

## 2022-11-15 NOTE — Progress Notes (Signed)
This encounter was created in error - please disregard.

## 2022-11-20 DIAGNOSIS — I10 Essential (primary) hypertension: Secondary | ICD-10-CM | POA: Diagnosis not present

## 2022-11-20 DIAGNOSIS — K219 Gastro-esophageal reflux disease without esophagitis: Secondary | ICD-10-CM | POA: Diagnosis not present

## 2022-11-20 DIAGNOSIS — G603 Idiopathic progressive neuropathy: Secondary | ICD-10-CM | POA: Diagnosis not present

## 2022-11-20 DIAGNOSIS — G309 Alzheimer's disease, unspecified: Secondary | ICD-10-CM | POA: Diagnosis not present

## 2022-11-20 DIAGNOSIS — F02818 Dementia in other diseases classified elsewhere, unspecified severity, with other behavioral disturbance: Secondary | ICD-10-CM | POA: Diagnosis not present

## 2022-11-20 DIAGNOSIS — M160 Bilateral primary osteoarthritis of hip: Secondary | ICD-10-CM | POA: Diagnosis not present

## 2022-11-20 DIAGNOSIS — F331 Major depressive disorder, recurrent, moderate: Secondary | ICD-10-CM | POA: Diagnosis not present

## 2022-11-30 DIAGNOSIS — R7303 Prediabetes: Secondary | ICD-10-CM | POA: Diagnosis not present

## 2022-11-30 DIAGNOSIS — M2041 Other hammer toe(s) (acquired), right foot: Secondary | ICD-10-CM | POA: Diagnosis not present

## 2022-11-30 DIAGNOSIS — L84 Corns and callosities: Secondary | ICD-10-CM | POA: Diagnosis not present

## 2022-11-30 DIAGNOSIS — M79675 Pain in left toe(s): Secondary | ICD-10-CM | POA: Diagnosis not present

## 2022-11-30 DIAGNOSIS — B351 Tinea unguium: Secondary | ICD-10-CM | POA: Diagnosis not present

## 2022-12-04 DIAGNOSIS — F331 Major depressive disorder, recurrent, moderate: Secondary | ICD-10-CM | POA: Diagnosis not present

## 2022-12-04 DIAGNOSIS — M160 Bilateral primary osteoarthritis of hip: Secondary | ICD-10-CM | POA: Diagnosis not present

## 2022-12-04 DIAGNOSIS — F5105 Insomnia due to other mental disorder: Secondary | ICD-10-CM | POA: Diagnosis not present

## 2022-12-04 DIAGNOSIS — F02818 Dementia in other diseases classified elsewhere, unspecified severity, with other behavioral disturbance: Secondary | ICD-10-CM | POA: Diagnosis not present

## 2022-12-04 DIAGNOSIS — F419 Anxiety disorder, unspecified: Secondary | ICD-10-CM | POA: Diagnosis not present

## 2022-12-04 DIAGNOSIS — G309 Alzheimer's disease, unspecified: Secondary | ICD-10-CM | POA: Diagnosis not present

## 2022-12-04 DIAGNOSIS — F39 Unspecified mood [affective] disorder: Secondary | ICD-10-CM | POA: Diagnosis not present

## 2022-12-10 DIAGNOSIS — F419 Anxiety disorder, unspecified: Secondary | ICD-10-CM | POA: Diagnosis not present

## 2022-12-10 DIAGNOSIS — F331 Major depressive disorder, recurrent, moderate: Secondary | ICD-10-CM | POA: Diagnosis not present

## 2022-12-18 DIAGNOSIS — G309 Alzheimer's disease, unspecified: Secondary | ICD-10-CM | POA: Diagnosis not present

## 2022-12-18 DIAGNOSIS — R197 Diarrhea, unspecified: Secondary | ICD-10-CM | POA: Diagnosis not present

## 2022-12-18 DIAGNOSIS — R110 Nausea: Secondary | ICD-10-CM | POA: Diagnosis not present

## 2022-12-18 DIAGNOSIS — I10 Essential (primary) hypertension: Secondary | ICD-10-CM | POA: Diagnosis not present

## 2022-12-18 DIAGNOSIS — F02818 Dementia in other diseases classified elsewhere, unspecified severity, with other behavioral disturbance: Secondary | ICD-10-CM | POA: Diagnosis not present

## 2022-12-18 DIAGNOSIS — E785 Hyperlipidemia, unspecified: Secondary | ICD-10-CM | POA: Diagnosis not present

## 2022-12-23 IMAGING — DX DG CHEST 2V
2 series · 2 of 2 positions shown · non-contrast
Comparison: 04/12/2021

CLINICAL DATA: Chronic cough and wheezing

EXAM:
CHEST - 2 VIEW

[chest pa]
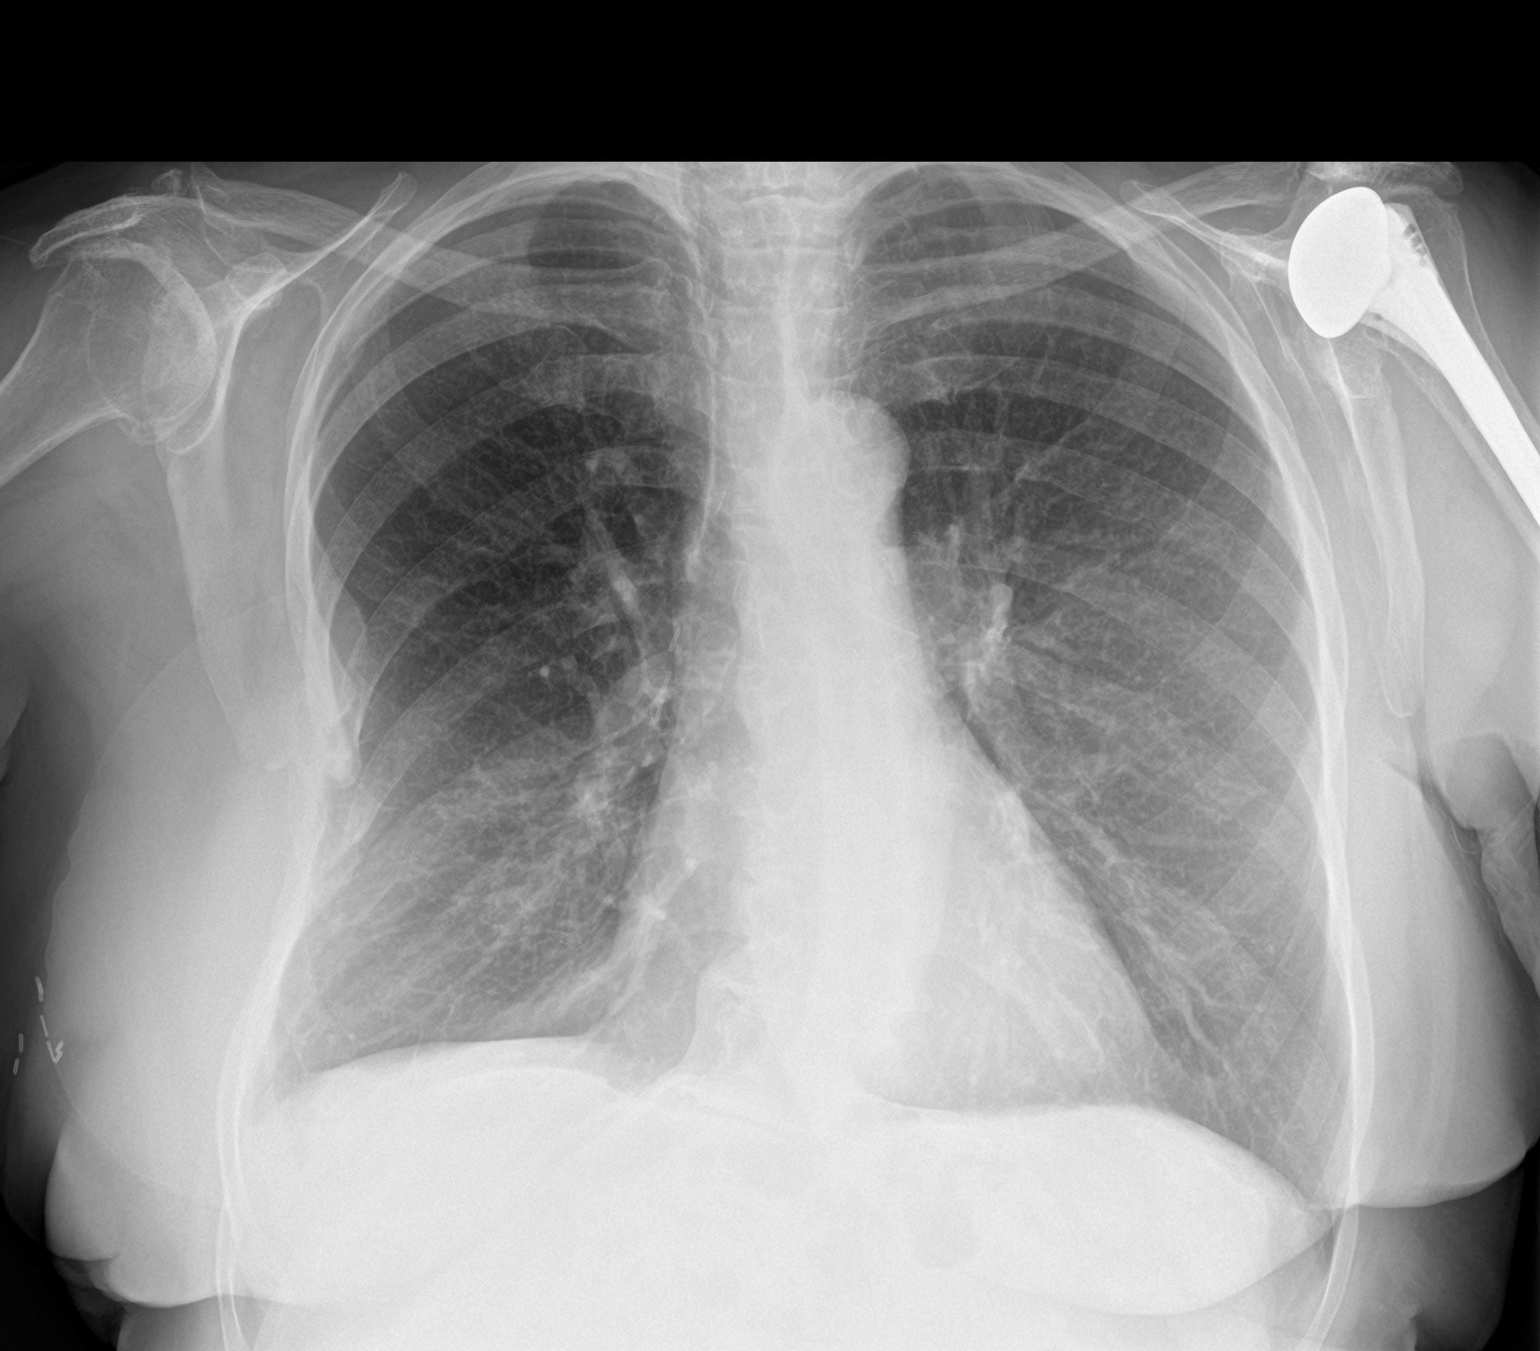

[chest lat]
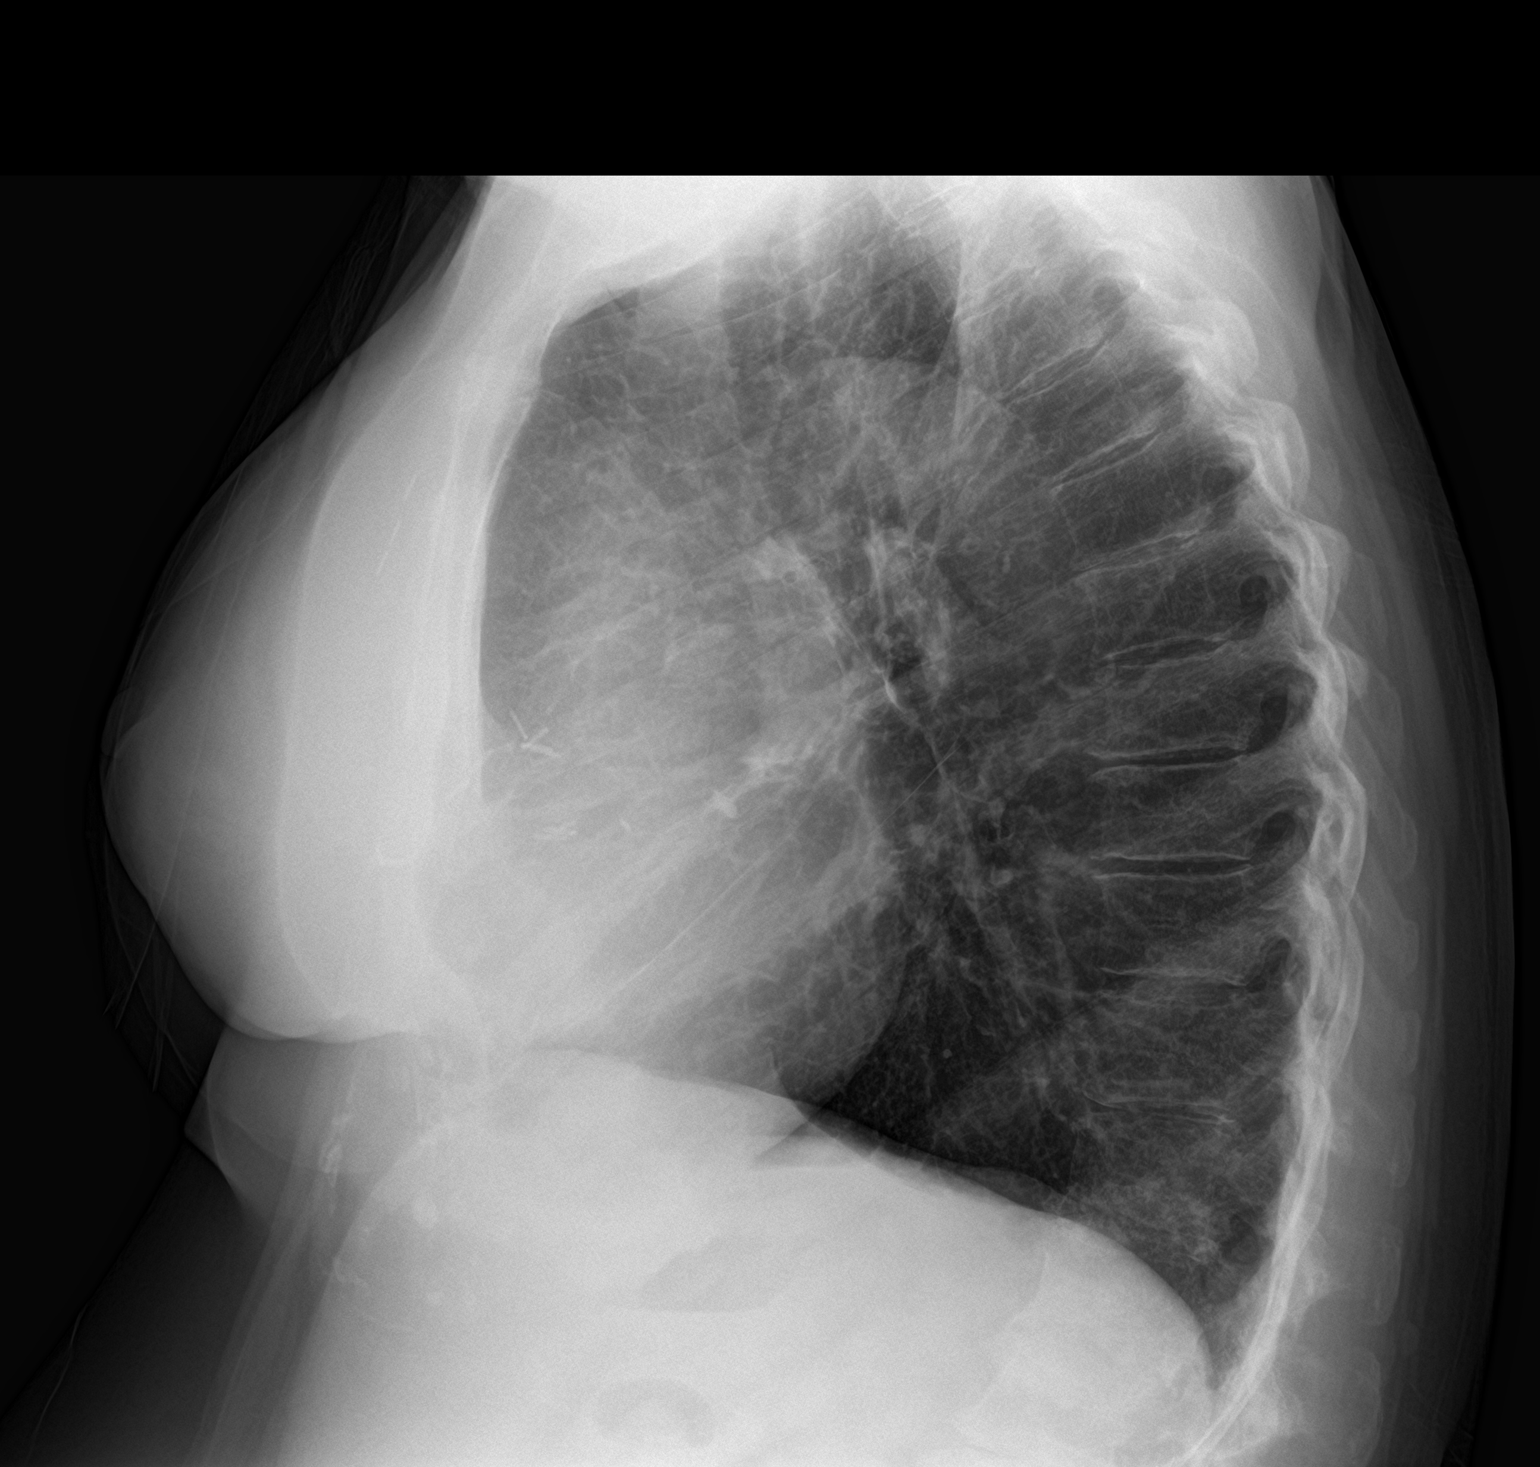

[2 of 2 positions shown; findings below may reference images not displayed]

FINDINGS: Chronic deformity of the right chest wall. No focal consolidation.
No pleural effusion or pneumothorax. Heart and mediastinal contours
are unremarkable.

No acute osseous abnormality. Left shoulder arthroplasty.
IMPRESSION: No active cardiopulmonary disease.

## 2023-01-01 DIAGNOSIS — G309 Alzheimer's disease, unspecified: Secondary | ICD-10-CM | POA: Diagnosis not present

## 2023-01-01 DIAGNOSIS — F331 Major depressive disorder, recurrent, moderate: Secondary | ICD-10-CM | POA: Diagnosis not present

## 2023-01-01 DIAGNOSIS — F02818 Dementia in other diseases classified elsewhere, unspecified severity, with other behavioral disturbance: Secondary | ICD-10-CM | POA: Diagnosis not present

## 2023-01-01 DIAGNOSIS — F419 Anxiety disorder, unspecified: Secondary | ICD-10-CM | POA: Diagnosis not present

## 2023-01-01 DIAGNOSIS — F5105 Insomnia due to other mental disorder: Secondary | ICD-10-CM | POA: Diagnosis not present

## 2023-01-12 DIAGNOSIS — M160 Bilateral primary osteoarthritis of hip: Secondary | ICD-10-CM | POA: Diagnosis not present

## 2023-01-12 DIAGNOSIS — F39 Unspecified mood [affective] disorder: Secondary | ICD-10-CM | POA: Diagnosis not present

## 2023-01-15 DIAGNOSIS — E559 Vitamin D deficiency, unspecified: Secondary | ICD-10-CM | POA: Diagnosis not present

## 2023-01-15 DIAGNOSIS — F02818 Dementia in other diseases classified elsewhere, unspecified severity, with other behavioral disturbance: Secondary | ICD-10-CM | POA: Diagnosis not present

## 2023-01-15 DIAGNOSIS — Z79899 Other long term (current) drug therapy: Secondary | ICD-10-CM | POA: Diagnosis not present

## 2023-01-15 DIAGNOSIS — I10 Essential (primary) hypertension: Secondary | ICD-10-CM | POA: Diagnosis not present

## 2023-01-15 DIAGNOSIS — G309 Alzheimer's disease, unspecified: Secondary | ICD-10-CM | POA: Diagnosis not present

## 2023-01-15 DIAGNOSIS — R509 Fever, unspecified: Secondary | ICD-10-CM | POA: Diagnosis not present

## 2023-01-15 DIAGNOSIS — E785 Hyperlipidemia, unspecified: Secondary | ICD-10-CM | POA: Diagnosis not present

## 2023-01-15 DIAGNOSIS — R051 Acute cough: Secondary | ICD-10-CM | POA: Diagnosis not present

## 2023-01-15 DIAGNOSIS — K219 Gastro-esophageal reflux disease without esophagitis: Secondary | ICD-10-CM | POA: Diagnosis not present

## 2023-01-18 DIAGNOSIS — E559 Vitamin D deficiency, unspecified: Secondary | ICD-10-CM | POA: Diagnosis not present

## 2023-01-18 DIAGNOSIS — E785 Hyperlipidemia, unspecified: Secondary | ICD-10-CM | POA: Diagnosis not present

## 2023-01-18 DIAGNOSIS — Z79899 Other long term (current) drug therapy: Secondary | ICD-10-CM | POA: Diagnosis not present

## 2023-01-18 DIAGNOSIS — I10 Essential (primary) hypertension: Secondary | ICD-10-CM | POA: Diagnosis not present

## 2023-01-29 DIAGNOSIS — F02818 Dementia in other diseases classified elsewhere, unspecified severity, with other behavioral disturbance: Secondary | ICD-10-CM | POA: Diagnosis not present

## 2023-01-29 DIAGNOSIS — G309 Alzheimer's disease, unspecified: Secondary | ICD-10-CM | POA: Diagnosis not present

## 2023-01-29 DIAGNOSIS — F331 Major depressive disorder, recurrent, moderate: Secondary | ICD-10-CM | POA: Diagnosis not present

## 2023-01-29 DIAGNOSIS — F5105 Insomnia due to other mental disorder: Secondary | ICD-10-CM | POA: Diagnosis not present

## 2023-01-29 DIAGNOSIS — F419 Anxiety disorder, unspecified: Secondary | ICD-10-CM | POA: Diagnosis not present

## 2023-02-11 DIAGNOSIS — M160 Bilateral primary osteoarthritis of hip: Secondary | ICD-10-CM | POA: Diagnosis not present

## 2023-02-11 DIAGNOSIS — F339 Major depressive disorder, recurrent, unspecified: Secondary | ICD-10-CM | POA: Diagnosis not present

## 2023-02-12 DIAGNOSIS — K219 Gastro-esophageal reflux disease without esophagitis: Secondary | ICD-10-CM | POA: Diagnosis not present

## 2023-02-12 DIAGNOSIS — E785 Hyperlipidemia, unspecified: Secondary | ICD-10-CM | POA: Diagnosis not present

## 2023-02-12 DIAGNOSIS — I10 Essential (primary) hypertension: Secondary | ICD-10-CM | POA: Diagnosis not present

## 2023-02-12 DIAGNOSIS — G309 Alzheimer's disease, unspecified: Secondary | ICD-10-CM | POA: Diagnosis not present

## 2023-02-12 DIAGNOSIS — F02818 Dementia in other diseases classified elsewhere, unspecified severity, with other behavioral disturbance: Secondary | ICD-10-CM | POA: Diagnosis not present

## 2023-02-12 DIAGNOSIS — G603 Idiopathic progressive neuropathy: Secondary | ICD-10-CM | POA: Diagnosis not present

## 2023-02-26 DIAGNOSIS — F02818 Dementia in other diseases classified elsewhere, unspecified severity, with other behavioral disturbance: Secondary | ICD-10-CM | POA: Diagnosis not present

## 2023-02-26 DIAGNOSIS — F5105 Insomnia due to other mental disorder: Secondary | ICD-10-CM | POA: Diagnosis not present

## 2023-02-26 DIAGNOSIS — R21 Rash and other nonspecific skin eruption: Secondary | ICD-10-CM | POA: Diagnosis not present

## 2023-02-26 DIAGNOSIS — F339 Major depressive disorder, recurrent, unspecified: Secondary | ICD-10-CM | POA: Diagnosis not present

## 2023-02-26 DIAGNOSIS — F411 Generalized anxiety disorder: Secondary | ICD-10-CM | POA: Diagnosis not present

## 2023-02-26 DIAGNOSIS — I10 Essential (primary) hypertension: Secondary | ICD-10-CM | POA: Diagnosis not present

## 2023-02-26 DIAGNOSIS — G309 Alzheimer's disease, unspecified: Secondary | ICD-10-CM | POA: Diagnosis not present

## 2023-03-10 DIAGNOSIS — F339 Major depressive disorder, recurrent, unspecified: Secondary | ICD-10-CM | POA: Diagnosis not present

## 2023-03-10 DIAGNOSIS — M160 Bilateral primary osteoarthritis of hip: Secondary | ICD-10-CM | POA: Diagnosis not present

## 2023-03-12 DIAGNOSIS — K219 Gastro-esophageal reflux disease without esophagitis: Secondary | ICD-10-CM | POA: Diagnosis not present

## 2023-03-12 DIAGNOSIS — G309 Alzheimer's disease, unspecified: Secondary | ICD-10-CM | POA: Diagnosis not present

## 2023-03-12 DIAGNOSIS — E785 Hyperlipidemia, unspecified: Secondary | ICD-10-CM | POA: Diagnosis not present

## 2023-03-12 DIAGNOSIS — F02818 Dementia in other diseases classified elsewhere, unspecified severity, with other behavioral disturbance: Secondary | ICD-10-CM | POA: Diagnosis not present

## 2023-03-12 DIAGNOSIS — I10 Essential (primary) hypertension: Secondary | ICD-10-CM | POA: Diagnosis not present

## 2023-03-12 DIAGNOSIS — G603 Idiopathic progressive neuropathy: Secondary | ICD-10-CM | POA: Diagnosis not present

## 2023-03-12 DIAGNOSIS — R21 Rash and other nonspecific skin eruption: Secondary | ICD-10-CM | POA: Diagnosis not present

## 2023-03-22 DIAGNOSIS — F5105 Insomnia due to other mental disorder: Secondary | ICD-10-CM | POA: Diagnosis not present

## 2023-03-22 DIAGNOSIS — F0390 Unspecified dementia without behavioral disturbance: Secondary | ICD-10-CM | POA: Diagnosis not present

## 2023-03-22 DIAGNOSIS — F339 Major depressive disorder, recurrent, unspecified: Secondary | ICD-10-CM | POA: Diagnosis not present

## 2023-03-22 DIAGNOSIS — F411 Generalized anxiety disorder: Secondary | ICD-10-CM | POA: Diagnosis not present

## 2023-03-26 DIAGNOSIS — R21 Rash and other nonspecific skin eruption: Secondary | ICD-10-CM | POA: Diagnosis not present

## 2023-03-26 DIAGNOSIS — G309 Alzheimer's disease, unspecified: Secondary | ICD-10-CM | POA: Diagnosis not present

## 2023-03-26 DIAGNOSIS — G603 Idiopathic progressive neuropathy: Secondary | ICD-10-CM | POA: Diagnosis not present

## 2023-03-26 DIAGNOSIS — F02818 Dementia in other diseases classified elsewhere, unspecified severity, with other behavioral disturbance: Secondary | ICD-10-CM | POA: Diagnosis not present

## 2023-03-28 DIAGNOSIS — Z556 Problems related to health literacy: Secondary | ICD-10-CM | POA: Diagnosis not present

## 2023-03-28 DIAGNOSIS — G309 Alzheimer's disease, unspecified: Secondary | ICD-10-CM | POA: Diagnosis not present

## 2023-03-28 DIAGNOSIS — F0283 Dementia in other diseases classified elsewhere, unspecified severity, with mood disturbance: Secondary | ICD-10-CM | POA: Diagnosis not present

## 2023-03-28 DIAGNOSIS — J45991 Cough variant asthma: Secondary | ICD-10-CM | POA: Diagnosis not present

## 2023-03-28 DIAGNOSIS — R7303 Prediabetes: Secondary | ICD-10-CM | POA: Diagnosis not present

## 2023-03-28 DIAGNOSIS — F02818 Dementia in other diseases classified elsewhere, unspecified severity, with other behavioral disturbance: Secondary | ICD-10-CM | POA: Diagnosis not present

## 2023-03-28 DIAGNOSIS — F321 Major depressive disorder, single episode, moderate: Secondary | ICD-10-CM | POA: Diagnosis not present

## 2023-03-28 DIAGNOSIS — E876 Hypokalemia: Secondary | ICD-10-CM | POA: Diagnosis not present

## 2023-03-28 DIAGNOSIS — G4700 Insomnia, unspecified: Secondary | ICD-10-CM | POA: Diagnosis not present

## 2023-03-28 DIAGNOSIS — M1991 Primary osteoarthritis, unspecified site: Secondary | ICD-10-CM | POA: Diagnosis not present

## 2023-03-28 DIAGNOSIS — R21 Rash and other nonspecific skin eruption: Secondary | ICD-10-CM | POA: Diagnosis not present

## 2023-03-28 DIAGNOSIS — F0284 Dementia in other diseases classified elsewhere, unspecified severity, with anxiety: Secondary | ICD-10-CM | POA: Diagnosis not present

## 2023-03-28 DIAGNOSIS — G603 Idiopathic progressive neuropathy: Secondary | ICD-10-CM | POA: Diagnosis not present

## 2023-03-28 DIAGNOSIS — I10 Essential (primary) hypertension: Secondary | ICD-10-CM | POA: Diagnosis not present

## 2023-03-29 DIAGNOSIS — F0284 Dementia in other diseases classified elsewhere, unspecified severity, with anxiety: Secondary | ICD-10-CM | POA: Diagnosis not present

## 2023-03-29 DIAGNOSIS — G309 Alzheimer's disease, unspecified: Secondary | ICD-10-CM | POA: Diagnosis not present

## 2023-03-29 DIAGNOSIS — G4700 Insomnia, unspecified: Secondary | ICD-10-CM | POA: Diagnosis not present

## 2023-03-29 DIAGNOSIS — F02818 Dementia in other diseases classified elsewhere, unspecified severity, with other behavioral disturbance: Secondary | ICD-10-CM | POA: Diagnosis not present

## 2023-03-29 DIAGNOSIS — M1991 Primary osteoarthritis, unspecified site: Secondary | ICD-10-CM | POA: Diagnosis not present

## 2023-03-29 DIAGNOSIS — G603 Idiopathic progressive neuropathy: Secondary | ICD-10-CM | POA: Diagnosis not present

## 2023-04-04 DIAGNOSIS — G4700 Insomnia, unspecified: Secondary | ICD-10-CM | POA: Diagnosis not present

## 2023-04-04 DIAGNOSIS — G603 Idiopathic progressive neuropathy: Secondary | ICD-10-CM | POA: Diagnosis not present

## 2023-04-04 DIAGNOSIS — M1991 Primary osteoarthritis, unspecified site: Secondary | ICD-10-CM | POA: Diagnosis not present

## 2023-04-04 DIAGNOSIS — G309 Alzheimer's disease, unspecified: Secondary | ICD-10-CM | POA: Diagnosis not present

## 2023-04-04 DIAGNOSIS — F02818 Dementia in other diseases classified elsewhere, unspecified severity, with other behavioral disturbance: Secondary | ICD-10-CM | POA: Diagnosis not present

## 2023-04-04 DIAGNOSIS — F0284 Dementia in other diseases classified elsewhere, unspecified severity, with anxiety: Secondary | ICD-10-CM | POA: Diagnosis not present

## 2023-04-05 DIAGNOSIS — G309 Alzheimer's disease, unspecified: Secondary | ICD-10-CM | POA: Diagnosis not present

## 2023-04-05 DIAGNOSIS — F0284 Dementia in other diseases classified elsewhere, unspecified severity, with anxiety: Secondary | ICD-10-CM | POA: Diagnosis not present

## 2023-04-05 DIAGNOSIS — G4700 Insomnia, unspecified: Secondary | ICD-10-CM | POA: Diagnosis not present

## 2023-04-05 DIAGNOSIS — F02818 Dementia in other diseases classified elsewhere, unspecified severity, with other behavioral disturbance: Secondary | ICD-10-CM | POA: Diagnosis not present

## 2023-04-05 DIAGNOSIS — M1991 Primary osteoarthritis, unspecified site: Secondary | ICD-10-CM | POA: Diagnosis not present

## 2023-04-05 DIAGNOSIS — G603 Idiopathic progressive neuropathy: Secondary | ICD-10-CM | POA: Diagnosis not present

## 2023-04-08 DIAGNOSIS — F02818 Dementia in other diseases classified elsewhere, unspecified severity, with other behavioral disturbance: Secondary | ICD-10-CM | POA: Diagnosis not present

## 2023-04-08 DIAGNOSIS — F0284 Dementia in other diseases classified elsewhere, unspecified severity, with anxiety: Secondary | ICD-10-CM | POA: Diagnosis not present

## 2023-04-08 DIAGNOSIS — G309 Alzheimer's disease, unspecified: Secondary | ICD-10-CM | POA: Diagnosis not present

## 2023-04-08 DIAGNOSIS — G4700 Insomnia, unspecified: Secondary | ICD-10-CM | POA: Diagnosis not present

## 2023-04-08 DIAGNOSIS — M1991 Primary osteoarthritis, unspecified site: Secondary | ICD-10-CM | POA: Diagnosis not present

## 2023-04-08 DIAGNOSIS — G603 Idiopathic progressive neuropathy: Secondary | ICD-10-CM | POA: Diagnosis not present

## 2023-04-09 DIAGNOSIS — M1991 Primary osteoarthritis, unspecified site: Secondary | ICD-10-CM | POA: Diagnosis not present

## 2023-04-09 DIAGNOSIS — G603 Idiopathic progressive neuropathy: Secondary | ICD-10-CM | POA: Diagnosis not present

## 2023-04-09 DIAGNOSIS — R600 Localized edema: Secondary | ICD-10-CM | POA: Diagnosis not present

## 2023-04-09 DIAGNOSIS — I10 Essential (primary) hypertension: Secondary | ICD-10-CM | POA: Diagnosis not present

## 2023-04-09 DIAGNOSIS — G309 Alzheimer's disease, unspecified: Secondary | ICD-10-CM | POA: Diagnosis not present

## 2023-04-09 DIAGNOSIS — G4700 Insomnia, unspecified: Secondary | ICD-10-CM | POA: Diagnosis not present

## 2023-04-09 DIAGNOSIS — F02818 Dementia in other diseases classified elsewhere, unspecified severity, with other behavioral disturbance: Secondary | ICD-10-CM | POA: Diagnosis not present

## 2023-04-09 DIAGNOSIS — R21 Rash and other nonspecific skin eruption: Secondary | ICD-10-CM | POA: Diagnosis not present

## 2023-04-09 DIAGNOSIS — F0284 Dementia in other diseases classified elsewhere, unspecified severity, with anxiety: Secondary | ICD-10-CM | POA: Diagnosis not present

## 2023-04-11 DIAGNOSIS — G8929 Other chronic pain: Secondary | ICD-10-CM | POA: Diagnosis not present

## 2023-04-11 DIAGNOSIS — M1991 Primary osteoarthritis, unspecified site: Secondary | ICD-10-CM | POA: Diagnosis not present

## 2023-04-12 DIAGNOSIS — F02818 Dementia in other diseases classified elsewhere, unspecified severity, with other behavioral disturbance: Secondary | ICD-10-CM | POA: Diagnosis not present

## 2023-04-12 DIAGNOSIS — G4700 Insomnia, unspecified: Secondary | ICD-10-CM | POA: Diagnosis not present

## 2023-04-12 DIAGNOSIS — G309 Alzheimer's disease, unspecified: Secondary | ICD-10-CM | POA: Diagnosis not present

## 2023-04-12 DIAGNOSIS — G603 Idiopathic progressive neuropathy: Secondary | ICD-10-CM | POA: Diagnosis not present

## 2023-04-12 DIAGNOSIS — M1991 Primary osteoarthritis, unspecified site: Secondary | ICD-10-CM | POA: Diagnosis not present

## 2023-04-12 DIAGNOSIS — F0284 Dementia in other diseases classified elsewhere, unspecified severity, with anxiety: Secondary | ICD-10-CM | POA: Diagnosis not present

## 2023-04-15 DIAGNOSIS — G309 Alzheimer's disease, unspecified: Secondary | ICD-10-CM | POA: Diagnosis not present

## 2023-04-15 DIAGNOSIS — M1991 Primary osteoarthritis, unspecified site: Secondary | ICD-10-CM | POA: Diagnosis not present

## 2023-04-15 DIAGNOSIS — F02818 Dementia in other diseases classified elsewhere, unspecified severity, with other behavioral disturbance: Secondary | ICD-10-CM | POA: Diagnosis not present

## 2023-04-15 DIAGNOSIS — G603 Idiopathic progressive neuropathy: Secondary | ICD-10-CM | POA: Diagnosis not present

## 2023-04-15 DIAGNOSIS — G4700 Insomnia, unspecified: Secondary | ICD-10-CM | POA: Diagnosis not present

## 2023-04-15 DIAGNOSIS — F0284 Dementia in other diseases classified elsewhere, unspecified severity, with anxiety: Secondary | ICD-10-CM | POA: Diagnosis not present

## 2023-04-17 DIAGNOSIS — G603 Idiopathic progressive neuropathy: Secondary | ICD-10-CM | POA: Diagnosis not present

## 2023-04-17 DIAGNOSIS — F02818 Dementia in other diseases classified elsewhere, unspecified severity, with other behavioral disturbance: Secondary | ICD-10-CM | POA: Diagnosis not present

## 2023-04-17 DIAGNOSIS — G4700 Insomnia, unspecified: Secondary | ICD-10-CM | POA: Diagnosis not present

## 2023-04-17 DIAGNOSIS — F0284 Dementia in other diseases classified elsewhere, unspecified severity, with anxiety: Secondary | ICD-10-CM | POA: Diagnosis not present

## 2023-04-17 DIAGNOSIS — G309 Alzheimer's disease, unspecified: Secondary | ICD-10-CM | POA: Diagnosis not present

## 2023-04-17 DIAGNOSIS — M1991 Primary osteoarthritis, unspecified site: Secondary | ICD-10-CM | POA: Diagnosis not present

## 2023-04-22 DIAGNOSIS — G603 Idiopathic progressive neuropathy: Secondary | ICD-10-CM | POA: Diagnosis not present

## 2023-04-22 DIAGNOSIS — G309 Alzheimer's disease, unspecified: Secondary | ICD-10-CM | POA: Diagnosis not present

## 2023-04-22 DIAGNOSIS — M1991 Primary osteoarthritis, unspecified site: Secondary | ICD-10-CM | POA: Diagnosis not present

## 2023-04-22 DIAGNOSIS — F02818 Dementia in other diseases classified elsewhere, unspecified severity, with other behavioral disturbance: Secondary | ICD-10-CM | POA: Diagnosis not present

## 2023-04-22 DIAGNOSIS — G4700 Insomnia, unspecified: Secondary | ICD-10-CM | POA: Diagnosis not present

## 2023-04-22 DIAGNOSIS — F0284 Dementia in other diseases classified elsewhere, unspecified severity, with anxiety: Secondary | ICD-10-CM | POA: Diagnosis not present

## 2023-04-23 DIAGNOSIS — G8929 Other chronic pain: Secondary | ICD-10-CM | POA: Diagnosis not present

## 2023-04-23 DIAGNOSIS — F0390 Unspecified dementia without behavioral disturbance: Secondary | ICD-10-CM | POA: Diagnosis not present

## 2023-04-23 DIAGNOSIS — F411 Generalized anxiety disorder: Secondary | ICD-10-CM | POA: Diagnosis not present

## 2023-04-23 DIAGNOSIS — F5105 Insomnia due to other mental disorder: Secondary | ICD-10-CM | POA: Diagnosis not present

## 2023-04-23 DIAGNOSIS — F331 Major depressive disorder, recurrent, moderate: Secondary | ICD-10-CM | POA: Diagnosis not present

## 2023-04-24 DIAGNOSIS — F02818 Dementia in other diseases classified elsewhere, unspecified severity, with other behavioral disturbance: Secondary | ICD-10-CM | POA: Diagnosis not present

## 2023-04-24 DIAGNOSIS — G4700 Insomnia, unspecified: Secondary | ICD-10-CM | POA: Diagnosis not present

## 2023-04-24 DIAGNOSIS — G309 Alzheimer's disease, unspecified: Secondary | ICD-10-CM | POA: Diagnosis not present

## 2023-04-24 DIAGNOSIS — M1991 Primary osteoarthritis, unspecified site: Secondary | ICD-10-CM | POA: Diagnosis not present

## 2023-04-24 DIAGNOSIS — G603 Idiopathic progressive neuropathy: Secondary | ICD-10-CM | POA: Diagnosis not present

## 2023-04-24 DIAGNOSIS — F0284 Dementia in other diseases classified elsewhere, unspecified severity, with anxiety: Secondary | ICD-10-CM | POA: Diagnosis not present

## 2023-04-26 DIAGNOSIS — F0284 Dementia in other diseases classified elsewhere, unspecified severity, with anxiety: Secondary | ICD-10-CM | POA: Diagnosis not present

## 2023-04-26 DIAGNOSIS — G309 Alzheimer's disease, unspecified: Secondary | ICD-10-CM | POA: Diagnosis not present

## 2023-04-26 DIAGNOSIS — F02818 Dementia in other diseases classified elsewhere, unspecified severity, with other behavioral disturbance: Secondary | ICD-10-CM | POA: Diagnosis not present

## 2023-04-26 DIAGNOSIS — G4700 Insomnia, unspecified: Secondary | ICD-10-CM | POA: Diagnosis not present

## 2023-04-26 DIAGNOSIS — G603 Idiopathic progressive neuropathy: Secondary | ICD-10-CM | POA: Diagnosis not present

## 2023-04-26 DIAGNOSIS — M1991 Primary osteoarthritis, unspecified site: Secondary | ICD-10-CM | POA: Diagnosis not present

## 2023-04-27 DIAGNOSIS — J45991 Cough variant asthma: Secondary | ICD-10-CM | POA: Diagnosis not present

## 2023-04-27 DIAGNOSIS — F321 Major depressive disorder, single episode, moderate: Secondary | ICD-10-CM | POA: Diagnosis not present

## 2023-04-27 DIAGNOSIS — M1991 Primary osteoarthritis, unspecified site: Secondary | ICD-10-CM | POA: Diagnosis not present

## 2023-04-27 DIAGNOSIS — I10 Essential (primary) hypertension: Secondary | ICD-10-CM | POA: Diagnosis not present

## 2023-04-27 DIAGNOSIS — R7303 Prediabetes: Secondary | ICD-10-CM | POA: Diagnosis not present

## 2023-04-27 DIAGNOSIS — E876 Hypokalemia: Secondary | ICD-10-CM | POA: Diagnosis not present

## 2023-04-27 DIAGNOSIS — Z556 Problems related to health literacy: Secondary | ICD-10-CM | POA: Diagnosis not present

## 2023-04-27 DIAGNOSIS — G603 Idiopathic progressive neuropathy: Secondary | ICD-10-CM | POA: Diagnosis not present

## 2023-04-27 DIAGNOSIS — G4700 Insomnia, unspecified: Secondary | ICD-10-CM | POA: Diagnosis not present

## 2023-04-27 DIAGNOSIS — F0284 Dementia in other diseases classified elsewhere, unspecified severity, with anxiety: Secondary | ICD-10-CM | POA: Diagnosis not present

## 2023-04-27 DIAGNOSIS — F0283 Dementia in other diseases classified elsewhere, unspecified severity, with mood disturbance: Secondary | ICD-10-CM | POA: Diagnosis not present

## 2023-04-27 DIAGNOSIS — R21 Rash and other nonspecific skin eruption: Secondary | ICD-10-CM | POA: Diagnosis not present

## 2023-04-27 DIAGNOSIS — F02818 Dementia in other diseases classified elsewhere, unspecified severity, with other behavioral disturbance: Secondary | ICD-10-CM | POA: Diagnosis not present

## 2023-04-27 DIAGNOSIS — G309 Alzheimer's disease, unspecified: Secondary | ICD-10-CM | POA: Diagnosis not present

## 2023-04-30 DIAGNOSIS — R600 Localized edema: Secondary | ICD-10-CM | POA: Diagnosis not present

## 2023-04-30 DIAGNOSIS — G309 Alzheimer's disease, unspecified: Secondary | ICD-10-CM | POA: Diagnosis not present

## 2023-04-30 DIAGNOSIS — I10 Essential (primary) hypertension: Secondary | ICD-10-CM | POA: Diagnosis not present

## 2023-04-30 DIAGNOSIS — F0284 Dementia in other diseases classified elsewhere, unspecified severity, with anxiety: Secondary | ICD-10-CM | POA: Diagnosis not present

## 2023-04-30 DIAGNOSIS — G4700 Insomnia, unspecified: Secondary | ICD-10-CM | POA: Diagnosis not present

## 2023-04-30 DIAGNOSIS — R21 Rash and other nonspecific skin eruption: Secondary | ICD-10-CM | POA: Diagnosis not present

## 2023-04-30 DIAGNOSIS — M1991 Primary osteoarthritis, unspecified site: Secondary | ICD-10-CM | POA: Diagnosis not present

## 2023-04-30 DIAGNOSIS — G603 Idiopathic progressive neuropathy: Secondary | ICD-10-CM | POA: Diagnosis not present

## 2023-04-30 DIAGNOSIS — F02818 Dementia in other diseases classified elsewhere, unspecified severity, with other behavioral disturbance: Secondary | ICD-10-CM | POA: Diagnosis not present

## 2023-05-07 DIAGNOSIS — I10 Essential (primary) hypertension: Secondary | ICD-10-CM | POA: Diagnosis not present

## 2023-05-07 DIAGNOSIS — E785 Hyperlipidemia, unspecified: Secondary | ICD-10-CM | POA: Diagnosis not present

## 2023-05-09 DIAGNOSIS — G309 Alzheimer's disease, unspecified: Secondary | ICD-10-CM | POA: Diagnosis not present

## 2023-05-09 DIAGNOSIS — F0284 Dementia in other diseases classified elsewhere, unspecified severity, with anxiety: Secondary | ICD-10-CM | POA: Diagnosis not present

## 2023-05-09 DIAGNOSIS — G4700 Insomnia, unspecified: Secondary | ICD-10-CM | POA: Diagnosis not present

## 2023-05-09 DIAGNOSIS — G603 Idiopathic progressive neuropathy: Secondary | ICD-10-CM | POA: Diagnosis not present

## 2023-05-09 DIAGNOSIS — F02818 Dementia in other diseases classified elsewhere, unspecified severity, with other behavioral disturbance: Secondary | ICD-10-CM | POA: Diagnosis not present

## 2023-05-09 DIAGNOSIS — M1991 Primary osteoarthritis, unspecified site: Secondary | ICD-10-CM | POA: Diagnosis not present

## 2023-05-14 DIAGNOSIS — M1991 Primary osteoarthritis, unspecified site: Secondary | ICD-10-CM | POA: Diagnosis not present

## 2023-05-14 DIAGNOSIS — G309 Alzheimer's disease, unspecified: Secondary | ICD-10-CM | POA: Diagnosis not present

## 2023-05-14 DIAGNOSIS — G4700 Insomnia, unspecified: Secondary | ICD-10-CM | POA: Diagnosis not present

## 2023-05-14 DIAGNOSIS — F02818 Dementia in other diseases classified elsewhere, unspecified severity, with other behavioral disturbance: Secondary | ICD-10-CM | POA: Diagnosis not present

## 2023-05-14 DIAGNOSIS — G603 Idiopathic progressive neuropathy: Secondary | ICD-10-CM | POA: Diagnosis not present

## 2023-05-14 DIAGNOSIS — F0284 Dementia in other diseases classified elsewhere, unspecified severity, with anxiety: Secondary | ICD-10-CM | POA: Diagnosis not present

## 2023-05-21 DIAGNOSIS — F0390 Unspecified dementia without behavioral disturbance: Secondary | ICD-10-CM | POA: Diagnosis not present

## 2023-05-21 DIAGNOSIS — F5105 Insomnia due to other mental disorder: Secondary | ICD-10-CM | POA: Diagnosis not present

## 2023-05-21 DIAGNOSIS — K219 Gastro-esophageal reflux disease without esophagitis: Secondary | ICD-10-CM | POA: Diagnosis not present

## 2023-05-21 DIAGNOSIS — F411 Generalized anxiety disorder: Secondary | ICD-10-CM | POA: Diagnosis not present

## 2023-05-21 DIAGNOSIS — I10 Essential (primary) hypertension: Secondary | ICD-10-CM | POA: Diagnosis not present

## 2023-05-21 DIAGNOSIS — F331 Major depressive disorder, recurrent, moderate: Secondary | ICD-10-CM | POA: Diagnosis not present

## 2023-05-21 DIAGNOSIS — G603 Idiopathic progressive neuropathy: Secondary | ICD-10-CM | POA: Diagnosis not present

## 2023-05-23 DIAGNOSIS — G309 Alzheimer's disease, unspecified: Secondary | ICD-10-CM | POA: Diagnosis not present

## 2023-05-23 DIAGNOSIS — M1991 Primary osteoarthritis, unspecified site: Secondary | ICD-10-CM | POA: Diagnosis not present

## 2023-05-23 DIAGNOSIS — G603 Idiopathic progressive neuropathy: Secondary | ICD-10-CM | POA: Diagnosis not present

## 2023-05-23 DIAGNOSIS — F02818 Dementia in other diseases classified elsewhere, unspecified severity, with other behavioral disturbance: Secondary | ICD-10-CM | POA: Diagnosis not present

## 2023-05-23 DIAGNOSIS — G4700 Insomnia, unspecified: Secondary | ICD-10-CM | POA: Diagnosis not present

## 2023-05-23 DIAGNOSIS — F0284 Dementia in other diseases classified elsewhere, unspecified severity, with anxiety: Secondary | ICD-10-CM | POA: Diagnosis not present

## 2023-05-25 DIAGNOSIS — R4182 Altered mental status, unspecified: Secondary | ICD-10-CM | POA: Diagnosis not present

## 2023-05-27 DIAGNOSIS — F321 Major depressive disorder, single episode, moderate: Secondary | ICD-10-CM | POA: Diagnosis not present

## 2023-05-27 DIAGNOSIS — Z9181 History of falling: Secondary | ICD-10-CM | POA: Diagnosis not present

## 2023-05-27 DIAGNOSIS — I10 Essential (primary) hypertension: Secondary | ICD-10-CM | POA: Diagnosis not present

## 2023-05-27 DIAGNOSIS — F0283 Dementia in other diseases classified elsewhere, unspecified severity, with mood disturbance: Secondary | ICD-10-CM | POA: Diagnosis not present

## 2023-05-27 DIAGNOSIS — G309 Alzheimer's disease, unspecified: Secondary | ICD-10-CM | POA: Diagnosis not present

## 2023-05-27 DIAGNOSIS — J45991 Cough variant asthma: Secondary | ICD-10-CM | POA: Diagnosis not present

## 2023-05-27 DIAGNOSIS — G603 Idiopathic progressive neuropathy: Secondary | ICD-10-CM | POA: Diagnosis not present

## 2023-05-27 DIAGNOSIS — F0284 Dementia in other diseases classified elsewhere, unspecified severity, with anxiety: Secondary | ICD-10-CM | POA: Diagnosis not present

## 2023-05-27 DIAGNOSIS — G4700 Insomnia, unspecified: Secondary | ICD-10-CM | POA: Diagnosis not present

## 2023-05-27 DIAGNOSIS — F02818 Dementia in other diseases classified elsewhere, unspecified severity, with other behavioral disturbance: Secondary | ICD-10-CM | POA: Diagnosis not present

## 2023-05-28 DIAGNOSIS — F02818 Dementia in other diseases classified elsewhere, unspecified severity, with other behavioral disturbance: Secondary | ICD-10-CM | POA: Diagnosis not present

## 2023-05-28 DIAGNOSIS — F0284 Dementia in other diseases classified elsewhere, unspecified severity, with anxiety: Secondary | ICD-10-CM | POA: Diagnosis not present

## 2023-05-28 DIAGNOSIS — G309 Alzheimer's disease, unspecified: Secondary | ICD-10-CM | POA: Diagnosis not present

## 2023-05-28 DIAGNOSIS — F0283 Dementia in other diseases classified elsewhere, unspecified severity, with mood disturbance: Secondary | ICD-10-CM | POA: Diagnosis not present

## 2023-05-28 DIAGNOSIS — G603 Idiopathic progressive neuropathy: Secondary | ICD-10-CM | POA: Diagnosis not present

## 2023-05-28 DIAGNOSIS — F321 Major depressive disorder, single episode, moderate: Secondary | ICD-10-CM | POA: Diagnosis not present

## 2023-05-31 DIAGNOSIS — F321 Major depressive disorder, single episode, moderate: Secondary | ICD-10-CM | POA: Diagnosis not present

## 2023-05-31 DIAGNOSIS — F02818 Dementia in other diseases classified elsewhere, unspecified severity, with other behavioral disturbance: Secondary | ICD-10-CM | POA: Diagnosis not present

## 2023-05-31 DIAGNOSIS — G603 Idiopathic progressive neuropathy: Secondary | ICD-10-CM | POA: Diagnosis not present

## 2023-05-31 DIAGNOSIS — R4182 Altered mental status, unspecified: Secondary | ICD-10-CM | POA: Diagnosis not present

## 2023-05-31 DIAGNOSIS — F0284 Dementia in other diseases classified elsewhere, unspecified severity, with anxiety: Secondary | ICD-10-CM | POA: Diagnosis not present

## 2023-05-31 DIAGNOSIS — F0283 Dementia in other diseases classified elsewhere, unspecified severity, with mood disturbance: Secondary | ICD-10-CM | POA: Diagnosis not present

## 2023-05-31 DIAGNOSIS — G309 Alzheimer's disease, unspecified: Secondary | ICD-10-CM | POA: Diagnosis not present

## 2023-06-03 DIAGNOSIS — F0283 Dementia in other diseases classified elsewhere, unspecified severity, with mood disturbance: Secondary | ICD-10-CM | POA: Diagnosis not present

## 2023-06-03 DIAGNOSIS — F02818 Dementia in other diseases classified elsewhere, unspecified severity, with other behavioral disturbance: Secondary | ICD-10-CM | POA: Diagnosis not present

## 2023-06-03 DIAGNOSIS — G603 Idiopathic progressive neuropathy: Secondary | ICD-10-CM | POA: Diagnosis not present

## 2023-06-03 DIAGNOSIS — F321 Major depressive disorder, single episode, moderate: Secondary | ICD-10-CM | POA: Diagnosis not present

## 2023-06-03 DIAGNOSIS — R350 Frequency of micturition: Secondary | ICD-10-CM | POA: Diagnosis not present

## 2023-06-03 DIAGNOSIS — G309 Alzheimer's disease, unspecified: Secondary | ICD-10-CM | POA: Diagnosis not present

## 2023-06-03 DIAGNOSIS — F0284 Dementia in other diseases classified elsewhere, unspecified severity, with anxiety: Secondary | ICD-10-CM | POA: Diagnosis not present

## 2023-06-04 DIAGNOSIS — F5105 Insomnia due to other mental disorder: Secondary | ICD-10-CM | POA: Diagnosis not present

## 2023-06-04 DIAGNOSIS — F331 Major depressive disorder, recurrent, moderate: Secondary | ICD-10-CM | POA: Diagnosis not present

## 2023-06-04 DIAGNOSIS — F03918 Unspecified dementia, unspecified severity, with other behavioral disturbance: Secondary | ICD-10-CM | POA: Diagnosis not present

## 2023-06-04 DIAGNOSIS — I10 Essential (primary) hypertension: Secondary | ICD-10-CM | POA: Diagnosis not present

## 2023-06-04 DIAGNOSIS — R600 Localized edema: Secondary | ICD-10-CM | POA: Diagnosis not present

## 2023-06-04 DIAGNOSIS — Z79899 Other long term (current) drug therapy: Secondary | ICD-10-CM | POA: Diagnosis not present

## 2023-06-04 DIAGNOSIS — G8929 Other chronic pain: Secondary | ICD-10-CM | POA: Diagnosis not present

## 2023-06-04 DIAGNOSIS — F411 Generalized anxiety disorder: Secondary | ICD-10-CM | POA: Diagnosis not present

## 2023-06-07 DIAGNOSIS — F0284 Dementia in other diseases classified elsewhere, unspecified severity, with anxiety: Secondary | ICD-10-CM | POA: Diagnosis not present

## 2023-06-07 DIAGNOSIS — F0283 Dementia in other diseases classified elsewhere, unspecified severity, with mood disturbance: Secondary | ICD-10-CM | POA: Diagnosis not present

## 2023-06-07 DIAGNOSIS — F02818 Dementia in other diseases classified elsewhere, unspecified severity, with other behavioral disturbance: Secondary | ICD-10-CM | POA: Diagnosis not present

## 2023-06-07 DIAGNOSIS — G603 Idiopathic progressive neuropathy: Secondary | ICD-10-CM | POA: Diagnosis not present

## 2023-06-07 DIAGNOSIS — G309 Alzheimer's disease, unspecified: Secondary | ICD-10-CM | POA: Diagnosis not present

## 2023-06-07 DIAGNOSIS — F321 Major depressive disorder, single episode, moderate: Secondary | ICD-10-CM | POA: Diagnosis not present

## 2023-06-10 DIAGNOSIS — G603 Idiopathic progressive neuropathy: Secondary | ICD-10-CM | POA: Diagnosis not present

## 2023-06-10 DIAGNOSIS — F0284 Dementia in other diseases classified elsewhere, unspecified severity, with anxiety: Secondary | ICD-10-CM | POA: Diagnosis not present

## 2023-06-10 DIAGNOSIS — F0283 Dementia in other diseases classified elsewhere, unspecified severity, with mood disturbance: Secondary | ICD-10-CM | POA: Diagnosis not present

## 2023-06-10 DIAGNOSIS — G309 Alzheimer's disease, unspecified: Secondary | ICD-10-CM | POA: Diagnosis not present

## 2023-06-10 DIAGNOSIS — F02818 Dementia in other diseases classified elsewhere, unspecified severity, with other behavioral disturbance: Secondary | ICD-10-CM | POA: Diagnosis not present

## 2023-06-10 DIAGNOSIS — F321 Major depressive disorder, single episode, moderate: Secondary | ICD-10-CM | POA: Diagnosis not present

## 2023-06-11 DIAGNOSIS — F321 Major depressive disorder, single episode, moderate: Secondary | ICD-10-CM | POA: Diagnosis not present

## 2023-06-11 DIAGNOSIS — F0284 Dementia in other diseases classified elsewhere, unspecified severity, with anxiety: Secondary | ICD-10-CM | POA: Diagnosis not present

## 2023-06-11 DIAGNOSIS — F02818 Dementia in other diseases classified elsewhere, unspecified severity, with other behavioral disturbance: Secondary | ICD-10-CM | POA: Diagnosis not present

## 2023-06-11 DIAGNOSIS — G603 Idiopathic progressive neuropathy: Secondary | ICD-10-CM | POA: Diagnosis not present

## 2023-06-11 DIAGNOSIS — F0283 Dementia in other diseases classified elsewhere, unspecified severity, with mood disturbance: Secondary | ICD-10-CM | POA: Diagnosis not present

## 2023-06-11 DIAGNOSIS — G309 Alzheimer's disease, unspecified: Secondary | ICD-10-CM | POA: Diagnosis not present

## 2023-06-13 DIAGNOSIS — G309 Alzheimer's disease, unspecified: Secondary | ICD-10-CM | POA: Diagnosis not present

## 2023-06-13 DIAGNOSIS — G603 Idiopathic progressive neuropathy: Secondary | ICD-10-CM | POA: Diagnosis not present

## 2023-06-13 DIAGNOSIS — F0283 Dementia in other diseases classified elsewhere, unspecified severity, with mood disturbance: Secondary | ICD-10-CM | POA: Diagnosis not present

## 2023-06-13 DIAGNOSIS — F02818 Dementia in other diseases classified elsewhere, unspecified severity, with other behavioral disturbance: Secondary | ICD-10-CM | POA: Diagnosis not present

## 2023-06-13 DIAGNOSIS — F0284 Dementia in other diseases classified elsewhere, unspecified severity, with anxiety: Secondary | ICD-10-CM | POA: Diagnosis not present

## 2023-06-13 DIAGNOSIS — F321 Major depressive disorder, single episode, moderate: Secondary | ICD-10-CM | POA: Diagnosis not present

## 2023-06-14 DIAGNOSIS — I10 Essential (primary) hypertension: Secondary | ICD-10-CM | POA: Diagnosis not present

## 2023-06-14 DIAGNOSIS — E785 Hyperlipidemia, unspecified: Secondary | ICD-10-CM | POA: Diagnosis not present

## 2023-06-18 DIAGNOSIS — G309 Alzheimer's disease, unspecified: Secondary | ICD-10-CM | POA: Diagnosis not present

## 2023-06-18 DIAGNOSIS — F0284 Dementia in other diseases classified elsewhere, unspecified severity, with anxiety: Secondary | ICD-10-CM | POA: Diagnosis not present

## 2023-06-18 DIAGNOSIS — I10 Essential (primary) hypertension: Secondary | ICD-10-CM | POA: Diagnosis not present

## 2023-06-18 DIAGNOSIS — F0283 Dementia in other diseases classified elsewhere, unspecified severity, with mood disturbance: Secondary | ICD-10-CM | POA: Diagnosis not present

## 2023-06-18 DIAGNOSIS — G8929 Other chronic pain: Secondary | ICD-10-CM | POA: Diagnosis not present

## 2023-06-18 DIAGNOSIS — F5105 Insomnia due to other mental disorder: Secondary | ICD-10-CM | POA: Diagnosis not present

## 2023-06-18 DIAGNOSIS — R21 Rash and other nonspecific skin eruption: Secondary | ICD-10-CM | POA: Diagnosis not present

## 2023-06-18 DIAGNOSIS — F02818 Dementia in other diseases classified elsewhere, unspecified severity, with other behavioral disturbance: Secondary | ICD-10-CM | POA: Diagnosis not present

## 2023-06-18 DIAGNOSIS — F321 Major depressive disorder, single episode, moderate: Secondary | ICD-10-CM | POA: Diagnosis not present

## 2023-06-18 DIAGNOSIS — F411 Generalized anxiety disorder: Secondary | ICD-10-CM | POA: Diagnosis not present

## 2023-06-18 DIAGNOSIS — F339 Major depressive disorder, recurrent, unspecified: Secondary | ICD-10-CM | POA: Diagnosis not present

## 2023-06-18 DIAGNOSIS — F0390 Unspecified dementia without behavioral disturbance: Secondary | ICD-10-CM | POA: Diagnosis not present

## 2023-06-18 DIAGNOSIS — G603 Idiopathic progressive neuropathy: Secondary | ICD-10-CM | POA: Diagnosis not present

## 2023-06-20 DIAGNOSIS — I10 Essential (primary) hypertension: Secondary | ICD-10-CM | POA: Diagnosis not present

## 2023-06-20 DIAGNOSIS — F0284 Dementia in other diseases classified elsewhere, unspecified severity, with anxiety: Secondary | ICD-10-CM | POA: Diagnosis not present

## 2023-06-20 DIAGNOSIS — F0283 Dementia in other diseases classified elsewhere, unspecified severity, with mood disturbance: Secondary | ICD-10-CM | POA: Diagnosis not present

## 2023-06-20 DIAGNOSIS — G629 Polyneuropathy, unspecified: Secondary | ICD-10-CM | POA: Diagnosis not present

## 2023-06-20 DIAGNOSIS — F321 Major depressive disorder, single episode, moderate: Secondary | ICD-10-CM | POA: Diagnosis not present

## 2023-06-20 DIAGNOSIS — F02818 Dementia in other diseases classified elsewhere, unspecified severity, with other behavioral disturbance: Secondary | ICD-10-CM | POA: Diagnosis not present

## 2023-06-20 DIAGNOSIS — R609 Edema, unspecified: Secondary | ICD-10-CM | POA: Diagnosis not present

## 2023-06-20 DIAGNOSIS — G309 Alzheimer's disease, unspecified: Secondary | ICD-10-CM | POA: Diagnosis not present

## 2023-06-20 DIAGNOSIS — G603 Idiopathic progressive neuropathy: Secondary | ICD-10-CM | POA: Diagnosis not present

## 2023-06-24 DIAGNOSIS — F321 Major depressive disorder, single episode, moderate: Secondary | ICD-10-CM | POA: Diagnosis not present

## 2023-06-24 DIAGNOSIS — F0283 Dementia in other diseases classified elsewhere, unspecified severity, with mood disturbance: Secondary | ICD-10-CM | POA: Diagnosis not present

## 2023-06-24 DIAGNOSIS — G603 Idiopathic progressive neuropathy: Secondary | ICD-10-CM | POA: Diagnosis not present

## 2023-06-24 DIAGNOSIS — G309 Alzheimer's disease, unspecified: Secondary | ICD-10-CM | POA: Diagnosis not present

## 2023-06-24 DIAGNOSIS — F0284 Dementia in other diseases classified elsewhere, unspecified severity, with anxiety: Secondary | ICD-10-CM | POA: Diagnosis not present

## 2023-06-24 DIAGNOSIS — F02818 Dementia in other diseases classified elsewhere, unspecified severity, with other behavioral disturbance: Secondary | ICD-10-CM | POA: Diagnosis not present

## 2023-06-25 DIAGNOSIS — F0283 Dementia in other diseases classified elsewhere, unspecified severity, with mood disturbance: Secondary | ICD-10-CM | POA: Diagnosis not present

## 2023-06-25 DIAGNOSIS — G309 Alzheimer's disease, unspecified: Secondary | ICD-10-CM | POA: Diagnosis not present

## 2023-06-25 DIAGNOSIS — F321 Major depressive disorder, single episode, moderate: Secondary | ICD-10-CM | POA: Diagnosis not present

## 2023-06-25 DIAGNOSIS — G603 Idiopathic progressive neuropathy: Secondary | ICD-10-CM | POA: Diagnosis not present

## 2023-06-25 DIAGNOSIS — F0284 Dementia in other diseases classified elsewhere, unspecified severity, with anxiety: Secondary | ICD-10-CM | POA: Diagnosis not present

## 2023-06-25 DIAGNOSIS — F02818 Dementia in other diseases classified elsewhere, unspecified severity, with other behavioral disturbance: Secondary | ICD-10-CM | POA: Diagnosis not present

## 2023-06-26 DIAGNOSIS — G4700 Insomnia, unspecified: Secondary | ICD-10-CM | POA: Diagnosis not present

## 2023-06-26 DIAGNOSIS — G603 Idiopathic progressive neuropathy: Secondary | ICD-10-CM | POA: Diagnosis not present

## 2023-06-26 DIAGNOSIS — J45991 Cough variant asthma: Secondary | ICD-10-CM | POA: Diagnosis not present

## 2023-06-26 DIAGNOSIS — G309 Alzheimer's disease, unspecified: Secondary | ICD-10-CM | POA: Diagnosis not present

## 2023-06-26 DIAGNOSIS — F0284 Dementia in other diseases classified elsewhere, unspecified severity, with anxiety: Secondary | ICD-10-CM | POA: Diagnosis not present

## 2023-06-26 DIAGNOSIS — Z9181 History of falling: Secondary | ICD-10-CM | POA: Diagnosis not present

## 2023-06-26 DIAGNOSIS — F321 Major depressive disorder, single episode, moderate: Secondary | ICD-10-CM | POA: Diagnosis not present

## 2023-06-26 DIAGNOSIS — F02818 Dementia in other diseases classified elsewhere, unspecified severity, with other behavioral disturbance: Secondary | ICD-10-CM | POA: Diagnosis not present

## 2023-06-26 DIAGNOSIS — I10 Essential (primary) hypertension: Secondary | ICD-10-CM | POA: Diagnosis not present

## 2023-06-26 DIAGNOSIS — F0283 Dementia in other diseases classified elsewhere, unspecified severity, with mood disturbance: Secondary | ICD-10-CM | POA: Diagnosis not present

## 2023-06-27 DIAGNOSIS — F02818 Dementia in other diseases classified elsewhere, unspecified severity, with other behavioral disturbance: Secondary | ICD-10-CM | POA: Diagnosis not present

## 2023-06-27 DIAGNOSIS — F321 Major depressive disorder, single episode, moderate: Secondary | ICD-10-CM | POA: Diagnosis not present

## 2023-06-27 DIAGNOSIS — F0283 Dementia in other diseases classified elsewhere, unspecified severity, with mood disturbance: Secondary | ICD-10-CM | POA: Diagnosis not present

## 2023-06-27 DIAGNOSIS — G603 Idiopathic progressive neuropathy: Secondary | ICD-10-CM | POA: Diagnosis not present

## 2023-06-27 DIAGNOSIS — F0284 Dementia in other diseases classified elsewhere, unspecified severity, with anxiety: Secondary | ICD-10-CM | POA: Diagnosis not present

## 2023-06-27 DIAGNOSIS — G309 Alzheimer's disease, unspecified: Secondary | ICD-10-CM | POA: Diagnosis not present

## 2023-07-02 DIAGNOSIS — F339 Major depressive disorder, recurrent, unspecified: Secondary | ICD-10-CM | POA: Diagnosis not present

## 2023-07-02 DIAGNOSIS — I10 Essential (primary) hypertension: Secondary | ICD-10-CM | POA: Diagnosis not present

## 2023-07-02 DIAGNOSIS — F0283 Dementia in other diseases classified elsewhere, unspecified severity, with mood disturbance: Secondary | ICD-10-CM | POA: Diagnosis not present

## 2023-07-02 DIAGNOSIS — F0284 Dementia in other diseases classified elsewhere, unspecified severity, with anxiety: Secondary | ICD-10-CM | POA: Diagnosis not present

## 2023-07-02 DIAGNOSIS — R110 Nausea: Secondary | ICD-10-CM | POA: Diagnosis not present

## 2023-07-02 DIAGNOSIS — G629 Polyneuropathy, unspecified: Secondary | ICD-10-CM | POA: Diagnosis not present

## 2023-07-02 DIAGNOSIS — F411 Generalized anxiety disorder: Secondary | ICD-10-CM | POA: Diagnosis not present

## 2023-07-02 DIAGNOSIS — G8929 Other chronic pain: Secondary | ICD-10-CM | POA: Diagnosis not present

## 2023-07-02 DIAGNOSIS — F02818 Dementia in other diseases classified elsewhere, unspecified severity, with other behavioral disturbance: Secondary | ICD-10-CM | POA: Diagnosis not present

## 2023-07-02 DIAGNOSIS — G603 Idiopathic progressive neuropathy: Secondary | ICD-10-CM | POA: Diagnosis not present

## 2023-07-02 DIAGNOSIS — F5105 Insomnia due to other mental disorder: Secondary | ICD-10-CM | POA: Diagnosis not present

## 2023-07-02 DIAGNOSIS — G309 Alzheimer's disease, unspecified: Secondary | ICD-10-CM | POA: Diagnosis not present

## 2023-07-02 DIAGNOSIS — F0390 Unspecified dementia without behavioral disturbance: Secondary | ICD-10-CM | POA: Diagnosis not present

## 2023-07-02 DIAGNOSIS — F321 Major depressive disorder, single episode, moderate: Secondary | ICD-10-CM | POA: Diagnosis not present

## 2023-07-10 DIAGNOSIS — F02818 Dementia in other diseases classified elsewhere, unspecified severity, with other behavioral disturbance: Secondary | ICD-10-CM | POA: Diagnosis not present

## 2023-07-10 DIAGNOSIS — F321 Major depressive disorder, single episode, moderate: Secondary | ICD-10-CM | POA: Diagnosis not present

## 2023-07-10 DIAGNOSIS — G603 Idiopathic progressive neuropathy: Secondary | ICD-10-CM | POA: Diagnosis not present

## 2023-07-10 DIAGNOSIS — G309 Alzheimer's disease, unspecified: Secondary | ICD-10-CM | POA: Diagnosis not present

## 2023-07-10 DIAGNOSIS — F0283 Dementia in other diseases classified elsewhere, unspecified severity, with mood disturbance: Secondary | ICD-10-CM | POA: Diagnosis not present

## 2023-07-10 DIAGNOSIS — F0284 Dementia in other diseases classified elsewhere, unspecified severity, with anxiety: Secondary | ICD-10-CM | POA: Diagnosis not present

## 2023-07-15 DIAGNOSIS — E785 Hyperlipidemia, unspecified: Secondary | ICD-10-CM | POA: Diagnosis not present

## 2023-07-15 DIAGNOSIS — I10 Essential (primary) hypertension: Secondary | ICD-10-CM | POA: Diagnosis not present

## 2023-07-16 DIAGNOSIS — E785 Hyperlipidemia, unspecified: Secondary | ICD-10-CM | POA: Diagnosis not present

## 2023-07-16 DIAGNOSIS — G8929 Other chronic pain: Secondary | ICD-10-CM | POA: Diagnosis not present

## 2023-07-16 DIAGNOSIS — F03918 Unspecified dementia, unspecified severity, with other behavioral disturbance: Secondary | ICD-10-CM | POA: Diagnosis not present

## 2023-07-16 DIAGNOSIS — K219 Gastro-esophageal reflux disease without esophagitis: Secondary | ICD-10-CM | POA: Diagnosis not present

## 2023-07-16 DIAGNOSIS — F411 Generalized anxiety disorder: Secondary | ICD-10-CM | POA: Diagnosis not present

## 2023-07-16 DIAGNOSIS — F5105 Insomnia due to other mental disorder: Secondary | ICD-10-CM | POA: Diagnosis not present

## 2023-07-16 DIAGNOSIS — I10 Essential (primary) hypertension: Secondary | ICD-10-CM | POA: Diagnosis not present

## 2023-07-16 DIAGNOSIS — F331 Major depressive disorder, recurrent, moderate: Secondary | ICD-10-CM | POA: Diagnosis not present

## 2023-07-23 DIAGNOSIS — Z79899 Other long term (current) drug therapy: Secondary | ICD-10-CM | POA: Diagnosis not present

## 2023-07-23 DIAGNOSIS — E559 Vitamin D deficiency, unspecified: Secondary | ICD-10-CM | POA: Diagnosis not present

## 2023-07-23 DIAGNOSIS — E785 Hyperlipidemia, unspecified: Secondary | ICD-10-CM | POA: Diagnosis not present

## 2023-07-23 IMAGING — DX DG CHEST 2V
2 series · 2 of 2 positions shown · non-contrast
Comparison: Chest x-ray dated November 22, 2021.

CLINICAL DATA: Productive cough and shortness of breath.

EXAM:
CHEST - 2 VIEW

[chest pa]
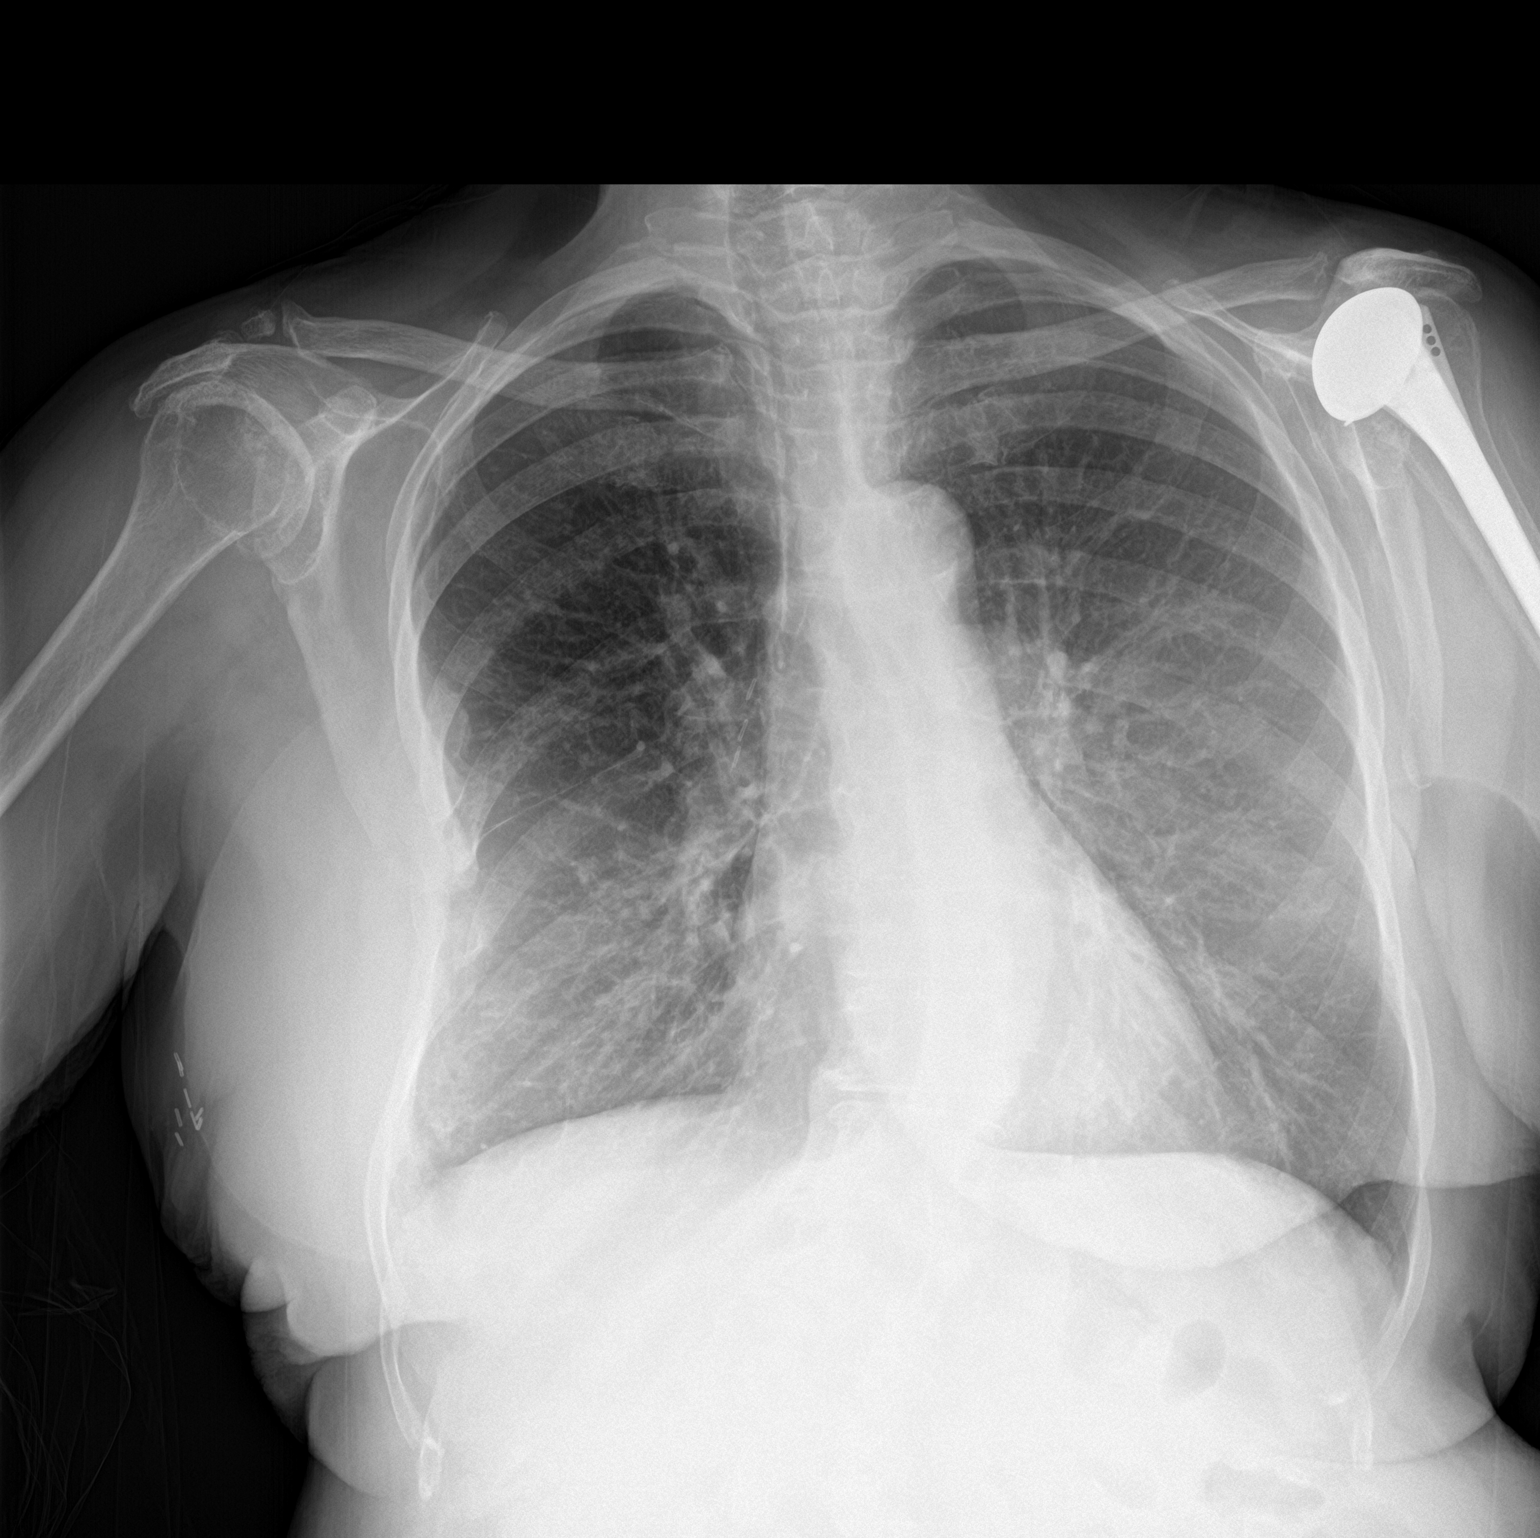

[chest lat]
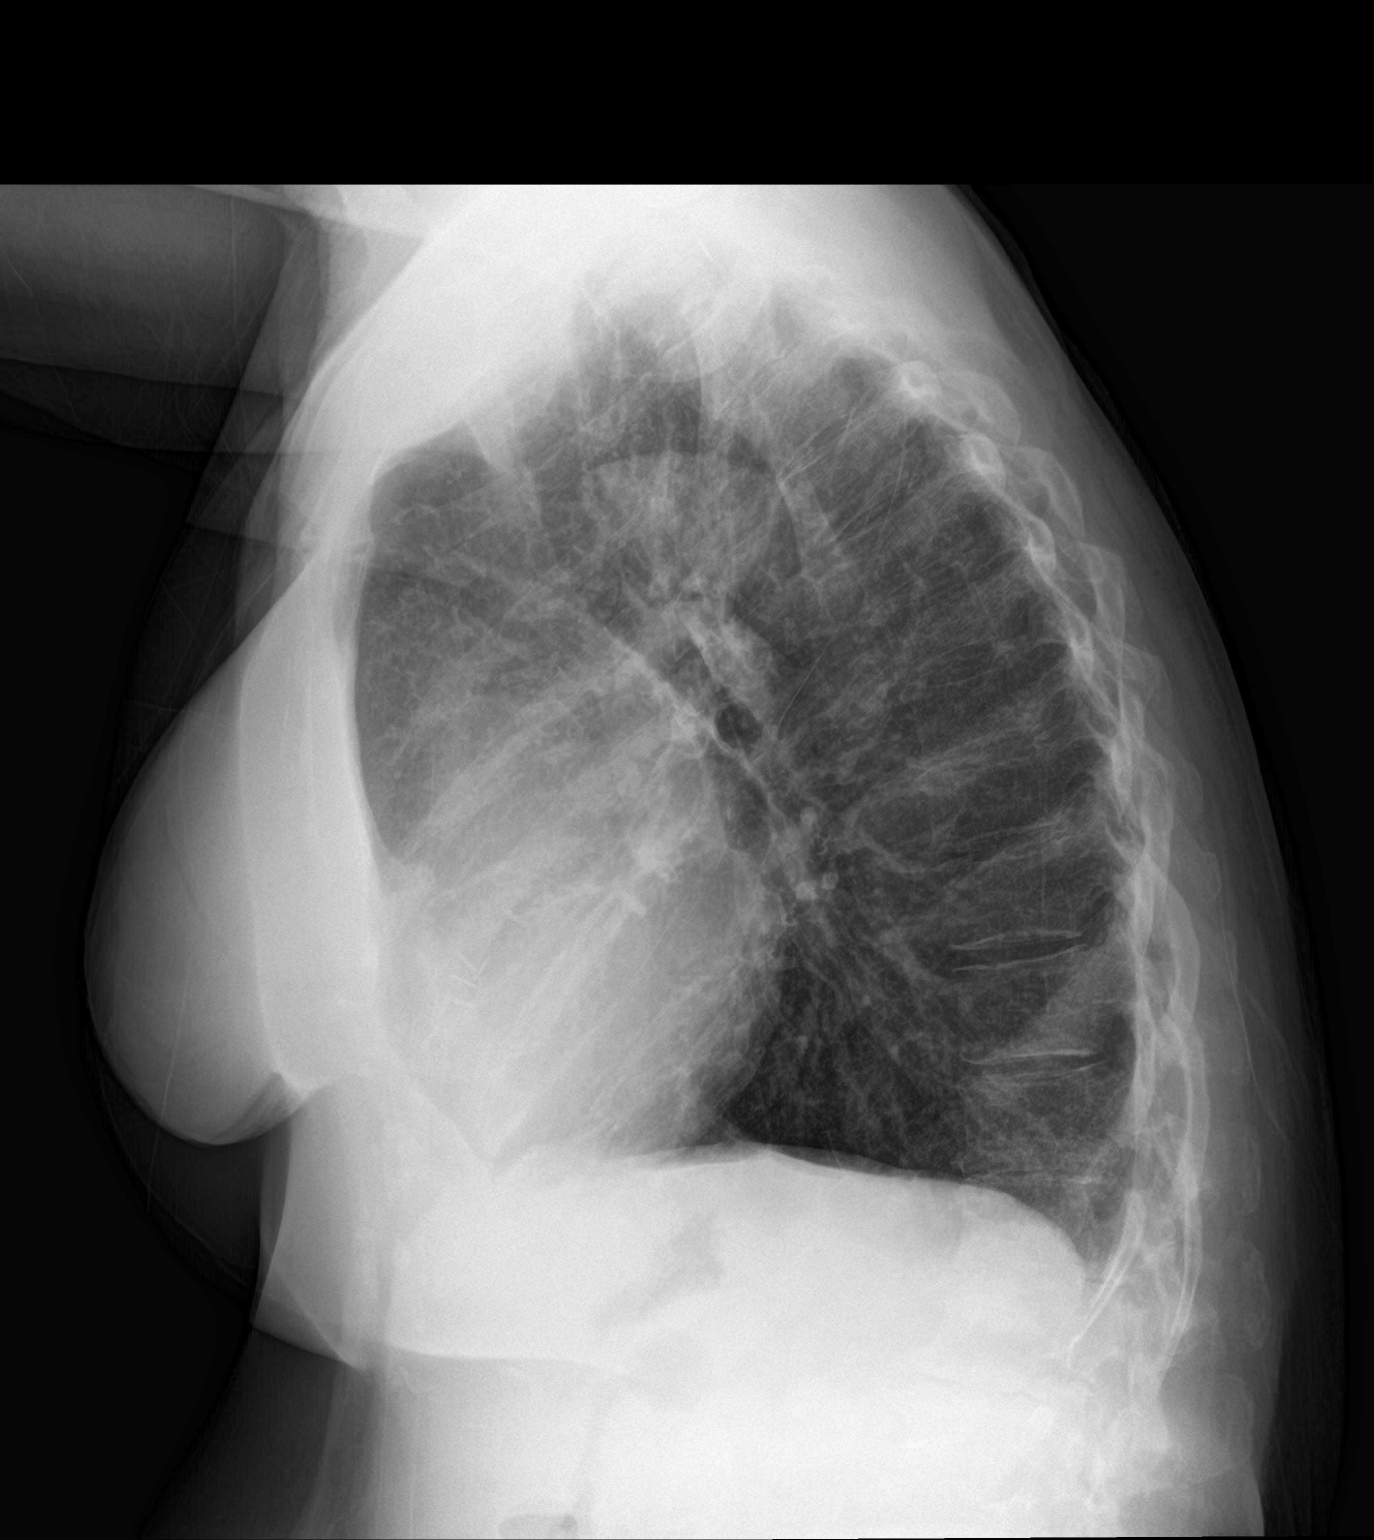

[2 of 2 positions shown; findings below may reference images not displayed]

FINDINGS: The heart size and mediastinal contours are within normal limits.
Normal pulmonary vascularity. No focal consolidation, pleural
effusion, or pneumothorax. Multiple old depressed right-sided rib
fractures again noted with underlying pleuroparenchymal scarring.
Prior left shoulder arthroplasty. No acute osseous abnormality.
IMPRESSION: 1. No acute cardiopulmonary disease.

## 2023-07-25 DIAGNOSIS — N390 Urinary tract infection, site not specified: Secondary | ICD-10-CM | POA: Diagnosis not present

## 2023-07-25 DIAGNOSIS — Z87440 Personal history of urinary (tract) infections: Secondary | ICD-10-CM | POA: Diagnosis not present

## 2023-08-06 DIAGNOSIS — M79622 Pain in left upper arm: Secondary | ICD-10-CM | POA: Diagnosis not present

## 2023-08-06 DIAGNOSIS — I10 Essential (primary) hypertension: Secondary | ICD-10-CM | POA: Diagnosis not present

## 2023-08-12 DIAGNOSIS — F5105 Insomnia due to other mental disorder: Secondary | ICD-10-CM | POA: Diagnosis not present

## 2023-08-12 DIAGNOSIS — F331 Major depressive disorder, recurrent, moderate: Secondary | ICD-10-CM | POA: Diagnosis not present

## 2023-08-12 DIAGNOSIS — J45991 Cough variant asthma: Secondary | ICD-10-CM | POA: Diagnosis not present

## 2023-08-12 DIAGNOSIS — F411 Generalized anxiety disorder: Secondary | ICD-10-CM | POA: Diagnosis not present

## 2023-08-12 DIAGNOSIS — G8929 Other chronic pain: Secondary | ICD-10-CM | POA: Diagnosis not present

## 2023-08-12 DIAGNOSIS — F03918 Unspecified dementia, unspecified severity, with other behavioral disturbance: Secondary | ICD-10-CM | POA: Diagnosis not present

## 2023-08-12 DIAGNOSIS — I1310 Hypertensive heart and chronic kidney disease without heart failure, with stage 1 through stage 4 chronic kidney disease, or unspecified chronic kidney disease: Secondary | ICD-10-CM | POA: Diagnosis not present

## 2023-08-13 DIAGNOSIS — N182 Chronic kidney disease, stage 2 (mild): Secondary | ICD-10-CM | POA: Diagnosis not present

## 2023-08-13 DIAGNOSIS — I1310 Hypertensive heart and chronic kidney disease without heart failure, with stage 1 through stage 4 chronic kidney disease, or unspecified chronic kidney disease: Secondary | ICD-10-CM | POA: Diagnosis not present

## 2023-08-13 DIAGNOSIS — F03918 Unspecified dementia, unspecified severity, with other behavioral disturbance: Secondary | ICD-10-CM | POA: Diagnosis not present

## 2023-08-23 DIAGNOSIS — Z23 Encounter for immunization: Secondary | ICD-10-CM | POA: Diagnosis not present

## 2023-08-26 DIAGNOSIS — R198 Other specified symptoms and signs involving the digestive system and abdomen: Secondary | ICD-10-CM | POA: Diagnosis not present

## 2023-08-26 DIAGNOSIS — K5909 Other constipation: Secondary | ICD-10-CM | POA: Diagnosis not present

## 2023-08-26 DIAGNOSIS — R112 Nausea with vomiting, unspecified: Secondary | ICD-10-CM | POA: Diagnosis not present

## 2023-08-27 DIAGNOSIS — N182 Chronic kidney disease, stage 2 (mild): Secondary | ICD-10-CM | POA: Diagnosis not present

## 2023-08-27 DIAGNOSIS — I1310 Hypertensive heart and chronic kidney disease without heart failure, with stage 1 through stage 4 chronic kidney disease, or unspecified chronic kidney disease: Secondary | ICD-10-CM | POA: Diagnosis not present

## 2023-08-27 DIAGNOSIS — L84 Corns and callosities: Secondary | ICD-10-CM | POA: Diagnosis not present

## 2023-09-03 DIAGNOSIS — L97521 Non-pressure chronic ulcer of other part of left foot limited to breakdown of skin: Secondary | ICD-10-CM | POA: Diagnosis not present

## 2023-09-03 DIAGNOSIS — L84 Corns and callosities: Secondary | ICD-10-CM | POA: Diagnosis not present

## 2023-09-03 DIAGNOSIS — M79675 Pain in left toe(s): Secondary | ICD-10-CM | POA: Diagnosis not present

## 2023-09-03 DIAGNOSIS — R7303 Prediabetes: Secondary | ICD-10-CM | POA: Diagnosis not present

## 2023-09-03 DIAGNOSIS — M2041 Other hammer toe(s) (acquired), right foot: Secondary | ICD-10-CM | POA: Diagnosis not present

## 2023-09-09 DIAGNOSIS — F5105 Insomnia due to other mental disorder: Secondary | ICD-10-CM | POA: Diagnosis not present

## 2023-09-09 DIAGNOSIS — G309 Alzheimer's disease, unspecified: Secondary | ICD-10-CM | POA: Diagnosis not present

## 2023-09-09 DIAGNOSIS — F03918 Unspecified dementia, unspecified severity, with other behavioral disturbance: Secondary | ICD-10-CM | POA: Diagnosis not present

## 2023-09-09 DIAGNOSIS — F411 Generalized anxiety disorder: Secondary | ICD-10-CM | POA: Diagnosis not present

## 2023-09-09 DIAGNOSIS — F331 Major depressive disorder, recurrent, moderate: Secondary | ICD-10-CM | POA: Diagnosis not present

## 2023-09-10 DIAGNOSIS — N182 Chronic kidney disease, stage 2 (mild): Secondary | ICD-10-CM | POA: Diagnosis not present

## 2023-09-10 DIAGNOSIS — K219 Gastro-esophageal reflux disease without esophagitis: Secondary | ICD-10-CM | POA: Diagnosis not present

## 2023-09-10 DIAGNOSIS — E785 Hyperlipidemia, unspecified: Secondary | ICD-10-CM | POA: Diagnosis not present

## 2023-09-10 DIAGNOSIS — M2041 Other hammer toe(s) (acquired), right foot: Secondary | ICD-10-CM | POA: Diagnosis not present

## 2023-09-10 DIAGNOSIS — R7303 Prediabetes: Secondary | ICD-10-CM | POA: Diagnosis not present

## 2023-09-10 DIAGNOSIS — L84 Corns and callosities: Secondary | ICD-10-CM | POA: Diagnosis not present

## 2023-09-10 DIAGNOSIS — M79675 Pain in left toe(s): Secondary | ICD-10-CM | POA: Diagnosis not present

## 2023-09-10 DIAGNOSIS — B351 Tinea unguium: Secondary | ICD-10-CM | POA: Diagnosis not present

## 2023-09-10 DIAGNOSIS — I1310 Hypertensive heart and chronic kidney disease without heart failure, with stage 1 through stage 4 chronic kidney disease, or unspecified chronic kidney disease: Secondary | ICD-10-CM | POA: Diagnosis not present

## 2023-09-14 DIAGNOSIS — I1310 Hypertensive heart and chronic kidney disease without heart failure, with stage 1 through stage 4 chronic kidney disease, or unspecified chronic kidney disease: Secondary | ICD-10-CM | POA: Diagnosis not present

## 2023-09-14 DIAGNOSIS — N182 Chronic kidney disease, stage 2 (mild): Secondary | ICD-10-CM | POA: Diagnosis not present

## 2023-09-24 DIAGNOSIS — I1310 Hypertensive heart and chronic kidney disease without heart failure, with stage 1 through stage 4 chronic kidney disease, or unspecified chronic kidney disease: Secondary | ICD-10-CM | POA: Diagnosis not present

## 2023-09-24 DIAGNOSIS — M1991 Primary osteoarthritis, unspecified site: Secondary | ICD-10-CM | POA: Diagnosis not present

## 2023-09-24 DIAGNOSIS — N182 Chronic kidney disease, stage 2 (mild): Secondary | ICD-10-CM | POA: Diagnosis not present

## 2023-09-24 DIAGNOSIS — E785 Hyperlipidemia, unspecified: Secondary | ICD-10-CM | POA: Diagnosis not present

## 2023-10-10 DIAGNOSIS — N182 Chronic kidney disease, stage 2 (mild): Secondary | ICD-10-CM | POA: Diagnosis not present

## 2023-10-10 DIAGNOSIS — I1310 Hypertensive heart and chronic kidney disease without heart failure, with stage 1 through stage 4 chronic kidney disease, or unspecified chronic kidney disease: Secondary | ICD-10-CM | POA: Diagnosis not present

## 2023-10-10 DIAGNOSIS — R609 Edema, unspecified: Secondary | ICD-10-CM | POA: Diagnosis not present

## 2023-10-10 DIAGNOSIS — G309 Alzheimer's disease, unspecified: Secondary | ICD-10-CM | POA: Diagnosis not present

## 2023-10-10 DIAGNOSIS — F03918 Unspecified dementia, unspecified severity, with other behavioral disturbance: Secondary | ICD-10-CM | POA: Diagnosis not present

## 2023-10-10 DIAGNOSIS — K219 Gastro-esophageal reflux disease without esophagitis: Secondary | ICD-10-CM | POA: Diagnosis not present

## 2023-10-10 DIAGNOSIS — G603 Idiopathic progressive neuropathy: Secondary | ICD-10-CM | POA: Diagnosis not present

## 2023-10-14 DIAGNOSIS — I1310 Hypertensive heart and chronic kidney disease without heart failure, with stage 1 through stage 4 chronic kidney disease, or unspecified chronic kidney disease: Secondary | ICD-10-CM | POA: Diagnosis not present

## 2023-10-14 DIAGNOSIS — N182 Chronic kidney disease, stage 2 (mild): Secondary | ICD-10-CM | POA: Diagnosis not present

## 2023-10-15 DIAGNOSIS — F411 Generalized anxiety disorder: Secondary | ICD-10-CM | POA: Diagnosis not present

## 2023-10-15 DIAGNOSIS — G309 Alzheimer's disease, unspecified: Secondary | ICD-10-CM | POA: Diagnosis not present

## 2023-10-15 DIAGNOSIS — F5105 Insomnia due to other mental disorder: Secondary | ICD-10-CM | POA: Diagnosis not present

## 2023-10-15 DIAGNOSIS — F331 Major depressive disorder, recurrent, moderate: Secondary | ICD-10-CM | POA: Diagnosis not present

## 2023-10-15 DIAGNOSIS — F03918 Unspecified dementia, unspecified severity, with other behavioral disturbance: Secondary | ICD-10-CM | POA: Diagnosis not present
